# Patient Record
Sex: Female | Born: 1985 | Race: White | Hispanic: No | Marital: Married | Smoking: Never smoker
Health system: Southern US, Community
[De-identification: ages and names within clinical notes are randomized; demographics above are authoritative.]

## PROBLEM LIST (undated history)

## (undated) DIAGNOSIS — D649 Anemia, unspecified: Secondary | ICD-10-CM

## (undated) DIAGNOSIS — Z9189 Other specified personal risk factors, not elsewhere classified: Secondary | ICD-10-CM

## (undated) DIAGNOSIS — Z91199 Patient's noncompliance with other medical treatment and regimen due to unspecified reason: Secondary | ICD-10-CM

## (undated) DIAGNOSIS — R51 Headache: Secondary | ICD-10-CM

## (undated) DIAGNOSIS — F419 Anxiety disorder, unspecified: Secondary | ICD-10-CM

## (undated) DIAGNOSIS — R519 Headache, unspecified: Secondary | ICD-10-CM

## (undated) DIAGNOSIS — R0600 Dyspnea, unspecified: Secondary | ICD-10-CM

## (undated) DIAGNOSIS — E669 Obesity, unspecified: Secondary | ICD-10-CM

## (undated) DIAGNOSIS — N946 Dysmenorrhea, unspecified: Secondary | ICD-10-CM

## (undated) DIAGNOSIS — L0390 Cellulitis, unspecified: Secondary | ICD-10-CM

## (undated) DIAGNOSIS — Z86718 Personal history of other venous thrombosis and embolism: Secondary | ICD-10-CM

## (undated) DIAGNOSIS — Z803 Family history of malignant neoplasm of breast: Secondary | ICD-10-CM

## (undated) DIAGNOSIS — K469 Unspecified abdominal hernia without obstruction or gangrene: Secondary | ICD-10-CM

## (undated) DIAGNOSIS — Z87442 Personal history of urinary calculi: Secondary | ICD-10-CM

## (undated) DIAGNOSIS — I82409 Acute embolism and thrombosis of unspecified deep veins of unspecified lower extremity: Secondary | ICD-10-CM

## (undated) DIAGNOSIS — K439 Ventral hernia without obstruction or gangrene: Secondary | ICD-10-CM

## (undated) DIAGNOSIS — Z862 Personal history of diseases of the blood and blood-forming organs and certain disorders involving the immune mechanism: Secondary | ICD-10-CM

## (undated) HISTORY — PX: LITHOTRIPSY: SUR834

## (undated) HISTORY — PX: INTRAUTERINE DEVICE (IUD) INSERTION: SHX5877

## (undated) HISTORY — DX: Unspecified abdominal hernia without obstruction or gangrene: K46.9

## (undated) HISTORY — DX: Obesity, unspecified: E66.9

## (undated) HISTORY — PX: CHOLECYSTECTOMY: SHX55

## (undated) HISTORY — DX: Family history of malignant neoplasm of breast: Z80.3

## (undated) HISTORY — DX: Cellulitis, unspecified: L03.90

## (undated) HISTORY — PX: TUBAL LIGATION: SHX77

## (undated) HISTORY — PX: TONSILLECTOMY AND ADENOIDECTOMY: SHX28

## (undated) HISTORY — DX: Other specified personal risk factors, not elsewhere classified: Z91.89

## (undated) HISTORY — DX: Acute embolism and thrombosis of unspecified deep veins of unspecified lower extremity: I82.409

## (undated) HISTORY — PX: DILITATION & CURRETTAGE/HYSTROSCOPY WITH ESSURE: SHX5573

## (undated) HISTORY — PX: TONSILLECTOMY: SUR1361

## (undated) HISTORY — DX: Personal history of diseases of the blood and blood-forming organs and certain disorders involving the immune mechanism: Z86.2

## (undated) HISTORY — DX: Dysmenorrhea, unspecified: N94.6

---
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
## 2006-08-26 ENCOUNTER — Emergency Department: Payer: Self-pay | Admitting: Unknown Physician Specialty

## 2007-04-11 ENCOUNTER — Emergency Department: Payer: Self-pay | Admitting: Emergency Medicine

## 2009-08-06 ENCOUNTER — Observation Stay: Payer: Self-pay | Admitting: Unknown Physician Specialty

## 2010-02-13 DIAGNOSIS — L0390 Cellulitis, unspecified: Secondary | ICD-10-CM

## 2010-02-13 HISTORY — DX: Cellulitis, unspecified: L03.90

## 2011-02-14 DIAGNOSIS — I82409 Acute embolism and thrombosis of unspecified deep veins of unspecified lower extremity: Secondary | ICD-10-CM

## 2011-02-14 HISTORY — DX: Acute embolism and thrombosis of unspecified deep veins of unspecified lower extremity: I82.409

## 2011-07-10 ENCOUNTER — Inpatient Hospital Stay: Payer: Self-pay | Admitting: Internal Medicine

## 2011-07-10 DIAGNOSIS — I82621 Acute embolism and thrombosis of deep veins of right upper extremity: Secondary | ICD-10-CM

## 2011-07-10 HISTORY — DX: Acute embolism and thrombosis of deep veins of right upper extremity: I82.621

## 2011-07-10 LAB — URINALYSIS, COMPLETE
Bilirubin,UR: NEGATIVE
Blood: NEGATIVE
Leukocyte Esterase: NEGATIVE
Nitrite: NEGATIVE
Ph: 7 (ref 4.5–8.0)
Protein: NEGATIVE
Squamous Epithelial: 1
WBC UR: 1 /HPF (ref 0–5)

## 2011-07-10 LAB — PREGNANCY, URINE: Pregnancy Test, Urine: POSITIVE m[IU]/mL

## 2011-07-10 LAB — COMPREHENSIVE METABOLIC PANEL
BUN: 6 mg/dL — ABNORMAL LOW (ref 7–18)
Creatinine: 0.5 mg/dL — ABNORMAL LOW (ref 0.60–1.30)
EGFR (African American): >60
EGFR (Non-African Amer.): >60
Glucose: 87 mg/dL (ref 65–99)
Potassium: 3.5 mmol/L (ref 3.5–5.1)
SGOT(AST): 25 U/L (ref 15–37)
SGPT (ALT): 24 U/L
Sodium: 141 mmol/L (ref 136–145)
Total Protein: 8 g/dL (ref 6.4–8.2)

## 2011-07-10 LAB — TROPONIN I: Troponin-I: <0.02 ng/mL

## 2011-07-10 LAB — CBC
HCT: 32.1 % — ABNORMAL LOW (ref 35.0–47.0)
HGB: 9.6 g/dL — ABNORMAL LOW (ref 12.0–16.0)
MCH: 19.2 pg — ABNORMAL LOW (ref 26.0–34.0)
MCV: 64 fL — ABNORMAL LOW (ref 80–100)
Platelet: 303 10*3/uL (ref 150–440)
WBC: 12 10*3/uL — ABNORMAL HIGH (ref 3.6–11.0)

## 2011-07-10 LAB — PRO B NATRIURETIC PEPTIDE: B-Type Natriuretic Peptide: 208 pg/mL — ABNORMAL HIGH (ref 0–125)

## 2011-07-10 LAB — CK TOTAL AND CKMB (NOT AT ARMC): CK, Total: 77 U/L (ref 21–215)

## 2011-07-11 LAB — CBC WITH DIFFERENTIAL/PLATELET
Basophil #: 0.1 10*3/uL (ref 0.0–0.1)
Basophil %: 0.7 %
HGB: 8.6 g/dL — ABNORMAL LOW (ref 12.0–16.0)
Lymphocyte #: 2.1 10*3/uL (ref 1.0–3.6)
MCH: 19.3 pg — ABNORMAL LOW (ref 26.0–34.0)
MCHC: 30.2 g/dL — ABNORMAL LOW (ref 32.0–36.0)
MCV: 64 fL — ABNORMAL LOW (ref 80–100)
Monocyte #: 0.5 x10 3/mm (ref 0.2–0.9)
Monocyte %: 5.6 %
Neutrophil %: 67.9 %
Platelet: 248 10*3/uL (ref 150–440)
RBC: 4.47 10*6/uL (ref 3.80–5.20)
RDW: 19.8 % — ABNORMAL HIGH (ref 11.5–14.5)
WBC: 8.5 10*3/uL (ref 3.6–11.0)

## 2011-07-11 LAB — BASIC METABOLIC PANEL
Anion Gap: 11 (ref 7–16)
BUN: 7 mg/dL (ref 7–18)
Calcium, Total: 8.2 mg/dL — ABNORMAL LOW (ref 8.5–10.1)
Co2: 24 mmol/L (ref 21–32)
Creatinine: 0.64 mg/dL (ref 0.60–1.30)
EGFR (African American): >60
Glucose: 91 mg/dL (ref 65–99)
Potassium: 3.4 mmol/L — ABNORMAL LOW (ref 3.5–5.1)
Sodium: 141 mmol/L (ref 136–145)

## 2011-07-11 LAB — IRON AND TIBC
Iron Bind.Cap.(Total): 335 ug/dL (ref 250–450)
Iron: 41 ug/dL — ABNORMAL LOW (ref 50–170)
Unbound Iron-Bind.Cap.: 294 ug/dL

## 2011-07-12 LAB — HCG, QUANTITATIVE, PREGNANCY: Beta Hcg, Quant.: 7441 m[IU]/mL — ABNORMAL HIGH

## 2011-08-28 ENCOUNTER — Encounter: Payer: Self-pay | Admitting: Maternal and Fetal Medicine

## 2011-11-15 ENCOUNTER — Ambulatory Visit: Payer: Self-pay | Admitting: Obstetrics & Gynecology

## 2011-12-28 ENCOUNTER — Observation Stay: Payer: Self-pay | Admitting: Obstetrics and Gynecology

## 2011-12-28 LAB — URINALYSIS, COMPLETE
Bilirubin,UR: NEGATIVE
Hyaline Cast: 1
Ketone: NEGATIVE
Leukocyte Esterase: NEGATIVE
Nitrite: NEGATIVE
Ph: 6 (ref 4.5–8.0)
Protein: NEGATIVE
RBC,UR: 11 /HPF (ref 0–5)
WBC UR: 2 /HPF (ref 0–5)

## 2011-12-28 LAB — FETAL FIBRONECTIN: Appearance: NORMAL

## 2012-02-14 HISTORY — PX: TUBAL LIGATION: SHX77

## 2012-02-22 ENCOUNTER — Encounter: Payer: Self-pay | Admitting: Maternal & Fetal Medicine

## 2012-03-05 ENCOUNTER — Inpatient Hospital Stay: Payer: Self-pay

## 2012-03-05 LAB — CBC WITH DIFFERENTIAL/PLATELET
Basophil #: 0.1 10*3/uL (ref 0.0–0.1)
Basophil %: 0.4 %
Eosinophil %: 1.1 %
HCT: 29 % — ABNORMAL LOW (ref 35.0–47.0)
Lymphocyte %: 14.1 %
MCH: 19.2 pg — ABNORMAL LOW (ref 26.0–34.0)
MCHC: 31 g/dL — ABNORMAL LOW (ref 32.0–36.0)
Monocyte #: 0.7 x10 3/mm (ref 0.2–0.9)
Neutrophil #: 10.1 10*3/uL — ABNORMAL HIGH (ref 1.4–6.5)
Platelet: 295 10*3/uL (ref 150–440)
RBC: 4.67 10*6/uL (ref 3.80–5.20)
RDW: 18.3 % — ABNORMAL HIGH (ref 11.5–14.5)
WBC: 12.8 10*3/uL — ABNORMAL HIGH (ref 3.6–11.0)

## 2012-03-06 LAB — HEMATOCRIT: HCT: 27.1 % — ABNORMAL LOW (ref 35.0–47.0)

## 2012-03-07 LAB — HEMOGLOBIN: HGB: 9 g/dL — ABNORMAL LOW (ref 12.0–16.0)

## 2012-03-07 LAB — PATHOLOGY REPORT

## 2012-03-07 LAB — CREATININE, SERUM: EGFR (Non-African Amer.): >60

## 2012-03-24 ENCOUNTER — Emergency Department: Payer: Self-pay | Admitting: Emergency Medicine

## 2012-03-24 LAB — COMPREHENSIVE METABOLIC PANEL
Alkaline Phosphatase: 105 U/L (ref 50–136)
Anion Gap: 8 (ref 7–16)
BUN: 13 mg/dL (ref 7–18)
Bilirubin,Total: 0.3 mg/dL (ref 0.2–1.0)
Calcium, Total: 9.2 mg/dL (ref 8.5–10.1)
Co2: 25 mmol/L (ref 21–32)
Creatinine: 0.64 mg/dL (ref 0.60–1.30)
EGFR (Non-African Amer.): >60
Glucose: 93 mg/dL (ref 65–99)
Osmolality: 279 (ref 275–301)
SGOT(AST): 18 U/L (ref 15–37)
SGPT (ALT): 23 U/L (ref 12–78)
Sodium: 140 mmol/L (ref 136–145)
Total Protein: 7.7 g/dL (ref 6.4–8.2)

## 2012-03-24 LAB — CBC WITH DIFFERENTIAL/PLATELET
Basophil %: 1.1 %
HCT: 32.4 % — ABNORMAL LOW (ref 35.0–47.0)
HGB: 9.8 g/dL — ABNORMAL LOW (ref 12.0–16.0)
Lymphocyte #: 1.8 10*3/uL (ref 1.0–3.6)
Lymphocyte %: 26.2 %
MCHC: 30.3 g/dL — ABNORMAL LOW (ref 32.0–36.0)
Monocyte %: 6.7 %
Neutrophil #: 4.5 10*3/uL (ref 1.4–6.5)
Neutrophil %: 63.2 %
Platelet: 596 10*3/uL — ABNORMAL HIGH (ref 150–440)
RDW: 18.6 % — ABNORMAL HIGH (ref 11.5–14.5)

## 2012-03-24 LAB — URINALYSIS, COMPLETE
Glucose,UR: NEGATIVE mg/dL (ref 0–75)
Ketone: NEGATIVE
Ph: 5 (ref 4.5–8.0)
Protein: 25
Specific Gravity: 1.02 (ref 1.003–1.030)

## 2012-03-24 LAB — PROTIME-INR: INR: 2.6

## 2012-05-23 ENCOUNTER — Emergency Department: Payer: Self-pay | Admitting: Emergency Medicine

## 2012-05-23 LAB — URINALYSIS, COMPLETE
Ketone: NEGATIVE
Nitrite: NEGATIVE
Protein: 30
Specific Gravity: 1.021 (ref 1.003–1.030)
Squamous Epithelial: <1
WBC UR: 2 /HPF (ref 0–5)

## 2012-05-23 LAB — BASIC METABOLIC PANEL
BUN: 15 mg/dL (ref 7–18)
Calcium, Total: 9 mg/dL (ref 8.5–10.1)
Co2: 27 mmol/L (ref 21–32)
Creatinine: 0.88 mg/dL (ref 0.60–1.30)
EGFR (African American): >60
EGFR (Non-African Amer.): >60
Glucose: 108 mg/dL — ABNORMAL HIGH (ref 65–99)

## 2012-05-23 LAB — CBC
HCT: 37 % (ref 35.0–47.0)
HGB: 11.9 g/dL — ABNORMAL LOW (ref 12.0–16.0)
MCH: 21.8 pg — ABNORMAL LOW (ref 26.0–34.0)
MCHC: 32.1 g/dL (ref 32.0–36.0)
MCV: 68 fL — ABNORMAL LOW (ref 80–100)
RDW: 21.2 % — ABNORMAL HIGH (ref 11.5–14.5)
WBC: 12 10*3/uL — ABNORMAL HIGH (ref 3.6–11.0)

## 2012-05-23 LAB — PREGNANCY, URINE: Pregnancy Test, Urine: NEGATIVE m[IU]/mL

## 2012-06-07 DIAGNOSIS — N1330 Unspecified hydronephrosis: Secondary | ICD-10-CM | POA: Insufficient documentation

## 2012-06-07 DIAGNOSIS — N201 Calculus of ureter: Secondary | ICD-10-CM | POA: Insufficient documentation

## 2012-06-07 DIAGNOSIS — R339 Retention of urine, unspecified: Secondary | ICD-10-CM | POA: Insufficient documentation

## 2012-06-07 DIAGNOSIS — N23 Unspecified renal colic: Secondary | ICD-10-CM | POA: Insufficient documentation

## 2012-06-10 ENCOUNTER — Ambulatory Visit: Payer: Self-pay | Admitting: Urology

## 2012-06-10 LAB — PREGNANCY, URINE: Pregnancy Test, Urine: NEGATIVE m[IU]/mL

## 2012-06-11 ENCOUNTER — Ambulatory Visit: Payer: Self-pay | Admitting: Urology

## 2013-10-08 LAB — HM PAP SMEAR: HM Pap smear: NORMAL

## 2013-11-13 DIAGNOSIS — Z803 Family history of malignant neoplasm of breast: Secondary | ICD-10-CM

## 2013-11-13 DIAGNOSIS — Z9189 Other specified personal risk factors, not elsewhere classified: Secondary | ICD-10-CM

## 2013-11-13 HISTORY — DX: Other specified personal risk factors, not elsewhere classified: Z91.89

## 2013-11-13 HISTORY — DX: Family history of malignant neoplasm of breast: Z80.3

## 2014-01-04 ENCOUNTER — Emergency Department: Payer: Self-pay | Admitting: Emergency Medicine

## 2014-01-04 LAB — URINALYSIS, COMPLETE
BACTERIA: NONE SEEN
BILIRUBIN, UR: NEGATIVE
Glucose,UR: NEGATIVE mg/dL (ref 0–75)
KETONE: NEGATIVE
Leukocyte Esterase: NEGATIVE
Nitrite: NEGATIVE
Ph: 6 (ref 4.5–8.0)
Protein: 30
RBC,UR: 2416 /HPF (ref 0–5)
Specific Gravity: 1.018 (ref 1.003–1.030)
Squamous Epithelial: NONE SEEN

## 2014-02-18 ENCOUNTER — Ambulatory Visit: Payer: Self-pay | Admitting: Obstetrics and Gynecology

## 2014-02-18 LAB — COMPREHENSIVE METABOLIC PANEL
ALBUMIN: 3.1 g/dL — AB (ref 3.4–5.0)
ALK PHOS: 88 U/L
AST: 22 U/L (ref 15–37)
Anion Gap: 7 (ref 7–16)
BUN: 8 mg/dL (ref 7–18)
Bilirubin,Total: 0.3 mg/dL (ref 0.2–1.0)
CALCIUM: 9.2 mg/dL (ref 8.5–10.1)
Chloride: 104 mmol/L (ref 98–107)
Co2: 27 mmol/L (ref 21–32)
Creatinine: 0.62 mg/dL (ref 0.60–1.30)
EGFR (African American): >60
EGFR (Non-African Amer.): >60
GLUCOSE: 82 mg/dL (ref 65–99)
Osmolality: 273 (ref 275–301)
Potassium: 3.7 mmol/L (ref 3.5–5.1)
SGPT (ALT): 27 U/L
SODIUM: 138 mmol/L (ref 136–145)
Total Protein: 7.8 g/dL (ref 6.4–8.2)

## 2014-02-18 LAB — CBC
HCT: 34.9 % — ABNORMAL LOW (ref 35.0–47.0)
HGB: 11.1 g/dL — ABNORMAL LOW (ref 12.0–16.0)
MCH: 22.7 pg — ABNORMAL LOW (ref 26.0–34.0)
MCHC: 31.7 g/dL — AB (ref 32.0–36.0)
MCV: 72 fL — ABNORMAL LOW (ref 80–100)
PLATELETS: 444 10*3/uL — AB (ref 150–440)
RBC: 4.87 10*6/uL (ref 3.80–5.20)
RDW: 16.8 % — AB (ref 11.5–14.5)
WBC: 9.3 10*3/uL (ref 3.6–11.0)

## 2014-02-26 ENCOUNTER — Ambulatory Visit: Payer: Self-pay | Admitting: Obstetrics and Gynecology

## 2014-06-02 NOTE — Consult Note (Signed)
Referral Information:   Reason for Referral RUE DVT with the pregnancy and Heterozygosity for protrhombin gene mutation    Referring Physician [facility]    Prenatal Hx Diana Sexton is a 29 year old [id number] with an EDC of 03/10/2012 (12 2/7 weeks??? gestation: pregnancy dated by ultrasound done on 6/28.2103 that had measurements of 9 5/7 weeks). On 07/10/2011, the patient started to have right arm pain and swelling. She had transient right chest pain and shortness of breath. She was hospitalized for a right upper extremity deep venous thrombosis ??? specifically there was an occlusive thrombus of the right subclavian vein and right brachial vein. There was a nonocclusive thrombus in the upper portion of the axillary vein. She was started on enoxaparin 1mg /kg twice daily. She was discharged on 07/12/2011. 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 During the pregnancy of her first delivery, she was given prophylaxis against venous thromboembolism. With that pregnancy, she had no thromboembolic complication.    Past Obstetrical Hx G1 -- 2010: SAb at 6 weeks G2 -- 2010: SAb at 10 weeks G3 -- 2011: NSVD female 8#0oz (possible mild shoulder dystocia) G4 -- 2012: 16 week IUFD; expelled fetus but needed D&C for placenta   Home Medications: Medication Instructions Status  Lovenox 115 milligram(s) injectable every 12 hours until delevery of baby Active  Prenatal Multivitamins oral tablet 1 tab(s) orally once a day Active   Allergies:   No Known Allergies:   Vital Signs/Notes:  Nursing Vital Signs: **Vital Signs.:   15-Jul-13 12:59   Vital Signs Type Routine   Temperature Temperature (F) 98.7   Celsius 37   Temperature Source oral   Pulse Pulse 91   Pulse source if not from Vital Sign Device dinamap   Respirations Respirations 18   Systolic BP Systolic BP 518   Diastolic BP (mmHg) Diastolic BP (mmHg) 77   Mean BP 98   BP Source  if not from Vital Sign  Device dinamap   Perinatal Consult:   Past Medical History cont'd Surgery -- as above   Cholecystectomy   T&A Other illness -- Obesity Transfusions -- none  Soc Hx    Smoking/ETOH/Drugs -- negative  Fam Hx - father with multiple VTE events; also has prothrombin gene mutation  ROS - nausea and vomitting of pregnancy   Impression/Recommendations:   Impression ??? IUP at 12 2/7 weeks??? gestation ??? Right upper extremity deep venous thrombosis diagnosed on 07/10/2011 (diagnosed at approximately 4 weeks??? gestation). ??? Symptoms consistent for (but not tested for) pulmonary embolism surrounding hospitalization for her right upper extremity deep venous thrombus ??? Heterozygosity for the prothrombin gene mutation    Recommendations ??? The patient had genetic counseling today. ??? Full anticoagulation with enoxaparin 1 mg/kg twice daily for the remainder of the pregnancy. ??? In third trimester, every 4 week ultrasounds to monitor fetal growth. ??? Plan delivery (induction) at 39-[redacted] weeks gestation. ??? Stop enoxaparin 12 hours before scheduled induction. ??? No medical anticoagulation during labor. ??? Provide intermittent lower extremity compression during labor (if patient not ambulating). ??? Twelve hours after vaginal delivery or 24 hours after cesarean delivery restart enoxaparin at 1mg /kg twice daily. ??? At greater than 7 days postpartum, start warfarin at 5 mg daily. ??? Continue enoxaparin until INR is 2.0-2.5. ??? Continue anticoagulation for at least 6 weeks postpartum (Length of anticoagulation may have to be longer if hematology consult deems it necessary.). ??? Patient will need full anticoagulation with all future pregnancies.  Total Time Spent with Patient 30 minutes    >50% of visit spent in couseling/coordination of care yes    Office Use Only 99[address]  Level 2 (67min) NEW office consult exp prob focused   Coding Description: OTHER: 1) RUE DVT; 2)  Prothrombin gene mutation.  Electronic Signatures: Sande Rives (MD)  (Signed 15-Jul-13 13:35)  Authored: Referral, Home Medications, Allergies, Vital Signs/Notes, Consult, Impression, Billing, Coding Description   Last Updated: 15-Jul-13 13:35 by Sande Rives (MD)

## 2014-06-05 NOTE — Op Note (Signed)
PATIENT NAME:  Diana Sexton, Diana Sexton MR#:  [id number] DATE OF BIRTH:  [birth year]  DATE OF PROCEDURE:  06/11/2012  PREOPERATIVE DIAGNOSIS: Left ureterolithiasis.   POSTOPERATIVE DIAGNOSIS: Left ureterolithiasis.   PROCEDURE PERFORMED: Left ureteroscopy with holmium laser lithotripsy.   SURGEON: Edrick Oh, MD   ANESTHESIA: Laryngeal mask airway anesthesia.   INDICATIONS: The patient is a 29 year old white female with a history of nephrolithiasis. She presented recently to the Emergency Room with left-sided flank pain. A CT scan demonstrated a 7 mm distal left ureteral calculus. There has been failure of progression with continued intermittent discomfort. She presents for ureteroscopic stone removal.   DESCRIPTION OF PROCEDURE: After informed consent was obtained, the patient was taken to the operating room and placed in the dorsal lithotomy position under laryngeal mask airway anesthesia. The patient was then prepped and draped in the usual standard fashion. The 22-French rigid cystoscope was introduced into the urethra under direct vision with no urethral abnormalities noted. Upon entering the bladder, the mucosa was inspected in its entirety with no gross mucosal lesions noted. Bilateral ureteral orifices were well visualized with no lesions noted. A flexible tip Glidewire was introduced into the left ureteral orifice and into the upper pole collecting system under fluoroscopic guidance without difficulty. No resistance was met.  The cystoscope was removed. The 6-French rigid ureteroscope was advanced into the urinary bladder. It was easily advanced into the left ureteral orifice.  Mild edema was encountered. The scope was able to be passed into a more dilated portion of the distal ureter approximately 1 cm past the orifice. An approximate 7 mm stone was encountered at that point.  The stone was fragmented into multiple smaller pieces utilizing the holmium laser fiber. The fragments were then basket  extracted. The scope was advanced to the level of the ureteropelvic junction with no additional abnormalities noted. The ureter was re-examined upon withdrawal of the scope.  No significant edema or occlusion of the ureter was noted at the site of stone impaction. The decision was made not to place a stent. Only a few small stone grains remained. The ureteroscope was removed. The cystoscope was replaced back into the urinary bladder. The remaining stone fragments were irrigated free from the bladder. They were collected and will be sent for stone analysis. The bladder was then drained. The cystoscope was removed. The patient was returned to the supine position and awakened from laryngeal mask airway anesthesia. She was taken to the recovery room in stable condition. There were no problems or complications. The patient tolerated the procedure well.   ____________________________ Denice Bors. Jacqlyn Larsen, MD bsc:cb D: 06/11/2012 12:58:12 ET T: 06/11/2012 15:00:45 ET JOB#: [id number]  cc: Denice Bors. Jacqlyn Larsen, MD, <Dictator> Denice Bors Evamae Rowen MD ELECTRONICALLY SIGNED 06/11/2012 17:23

## 2014-06-05 NOTE — Op Note (Signed)
PATIENT NAME:  Diana Sexton, JONS MR#:  [id number] DATE OF BIRTH:  Nov 18, 1985  DATE OF PROCEDURE:  03/06/2012  PREOPERATIVE DIAGNOSIS:  Multiparity, desires permanent sterilization.   POSTOPERATIVE DIAGNOSIS:  Multiparity, desires permanent sterilization.  PROCEDURE:  Postpartum bilateral tubal ligation via Pomeroy method.    ANESTHESIA:  General.   SURGEON:  Prentice Docker, M.D.  ESTIMATED BLOOD LOSS:  100 mL.   OPERATIVE FLUIDS:  900 mL crystalloid.   COMPLICATIONS:  Difficulty in finding right fallopian tube and possible removal of segment of round ligament.   FINDINGS:  Uterus that is somewhat adherent to right abdominal/pelvic sidewall and requiring great effort in extending the incision to locate the right fallopian tube, otherwise normal-appearing fallopian tubes.   SPECIMENS: 1.  Portion of right and left fallopian tubes.  2.  Likely portion of round ligament.   CONDITION:  Stable and unchanged at the end of procedure.   PROCEDURE IN DETAIL:  The patient was taken to the operating room where she was placed under general anesthesia, which was found to be adequate.  She was placed in the dorsal supine position and prepped and draped in the usual sterile fashion.  She had an existing vertical umbilical scar which was utilized initially and after a timeout was called, local anesthetic was injected into the umbilicus area and approximately a 3 cm incision was made, carried through the various layers until the rectus fascia was identified and entered sharply.  The rectus muscles then identified and the posterior fascial sheath was then identified and entered sharply.  Once the uterus was identified, the left fallopian tube was then easily identified, grabbed with a Babcock clamp and then followed out to the fimbriated edge.  Using the Babcock clamps the fallopian tube was then followed back to approximately 3 cm from the cornual region where approximately a 3 cm segment of the fallopian  tube was suture ligated and a portion of the tube was removed using Metzenbaum scissors.  Hemostasis was verified.  Two sutures of #0 plain gut were used to obtain ligation.  The fallopian tube was then returned to the abdomen.   Attention was then turned to the right portion of the uterus.  The patient had to be maximally airplaned to the left in order to attempt to have the uterus rotate to come to the incision.  Ultimately to gain visualization the opening was extended to approximately 5 cm.  Eventually a structure which appeared to be the fallopian tube was identified, grasped with a Babcock clamp and followed, however given the patient's body habitus and tension required to maintain visualization, the tissue began to loosen and became unattached and therefore it began to look somewhat like a fallopian tube.  At this point there was some bleeding and because there was a free end of what appeared to be the fallopian tube unattached to the mesosalpinx, the base of this structure was grasped with the Kelly clamp and then #0 Vicryl was then used to obtain hemostasis followed by a Heaney stitch to assure hemostasis.  This area was watched for quite a while to assess the hemostasis which was found to be present.  Given high suspicion for this not being the fallopian tube, further exploration was undertaken and indeed a fallopian tube on the right side was identified, separate from the previous structure.  This was identified, grasped with a Babcock clamp and followed out to the fimbriated edge.  Then followed back using the Babcock clamps to a region approximately  3 cm from the cornual region.  An approximate 3 cm segment was suture ligated with two sutures of 0 plain gut.  A portion of the tube was removed after ligation had taken place.  Hemostasis was obtained.  And after verification of hemostasis the fallopian tube was returned to the abdomen.  The abdominal cavity was monitored for several minutes to ensure  that there was no active bleeding and none was found.  The fascia was closed using #0 Vicryl on a UR-6 needle.  The subcutaneous fat was reapproximated using #2-0 Vicryl in a pursestring fashion around the umbilicus opening.  The skin was closed using #3-0 Vicryl in a subcuticular fashion.  The skin was reapproximated with Dermabond.  Another 20 mL of local anesthetic was injected on either side of the incision.   The patient tolerated the procedure well.  Sponge, lap, needle counts were correct x 2.  Throughout the procedure the patient had on pneumatic compression stockings which were in place and operating throughout the entire procedure.  She was awakened in the operating room and taken to the recovery area in stable condition.      ____________________________ Will Bonnet, MD sdj:ea D: 03/06/2012 20:36:14 ET T: 03/06/2012 23:44:42 ET JOB#: [id number]  cc: Will Bonnet, MD, <Dictator> Will Bonnet MD ELECTRONICALLY SIGNED 04/11/2012 22:06

## 2014-06-05 NOTE — Consult Note (Signed)
Referral Information:   Reason for Referral RUE DVT with the pregnancy and Heterozygosity for protrhombin gene mutation.  Here to readdress anticoagulation recommendations.    Referring Physician [facility]    Prenatal Hx Diana Sexton is a 29 year old [id number] with an EDC of 03/10/2012 (36/5 weeks??? gestation: pregnancy dated by ultrasound done on 6/28.2103 that had measurements of 9 5/7 weeks).  She met with Dr. Patric Dykes at [redacted] weeks gestation and is here to readdress the anticoagulation recommendations (remain on therapeutic anticoagulation with lovenox during pregnancy).  She is heterozygous for Prothrombin G gene mutation and on 07/10/2011, was hospitalized for a right upper extremity deep venous thrombosis due to occlusive thrombus of the right subclavian vein and right brachial vein. There was a nonocclusive thrombus in the upper portion of the axillary vein.  She also reported chest pain and shortness of breath.    Past Obstetrical Hx G1 -- 2010: SAb at 6 weeks G2 -- 2010: SAb at 10 weeks G3 -- 2011: NSVD female 8#0oz (possible mild shoulder dystocia received prophylactic anticoagulation, no history of clot formation) G4 -- 2012: 16 week IUFD; expelled fetus but needed D&C for placenta   Home Medications: Medication Instructions Status  Lovenox 115 milligram(s) injectable every 12 hours until delevery of baby Active  Prenatal Multivitamins oral tablet 1 tab(s) orally once a day Active   Allergies:   No Known Allergies:   Vital Signs/Notes:  Nursing Vital Signs: **Vital Signs.:   09-Jan-14 10:49   Temperature Temperature (F) 96.7   Celsius 35.9   Pulse Pulse 105   Respirations Respirations 18   Systolic BP Systolic BP 737   Diastolic BP (mmHg) Diastolic BP (mmHg) 70   Mean BP 85   Perinatal Consult:   Past Medical History cont'd Surgery -- as above   Cholecystectomy   T&A Other illness -- Obesity Transfusions -- none  Soc Hx    Smoking/ETOH/Drugs -- negative  Fam  Hx - father with multiple VTE events; also has prothrombin gene mutation  ROS - nausea and vomitting of pregnancy     Additional Lab/Radiology Notes fhr 140s   Impression/Recommendations:   Impression ??? IUP at 36/5 weeks??? with:  ??? Right upper extremity deep venous thrombosis diagnosed on 07/10/2011 (diagnosed at approximately 4 weeks??? gestation). ??? Symptoms consistent for (but not tested for) pulmonary embolism surrounding hospitalization for her right upper extremity deep venous thrombus ??? Heterozygosity for the prothrombin gene mutation gestation  Diana Sexton was here to discuss anticoagulation recommenations and delivery planning. She was previously counseled by Dr. Patric Dykes and, based on her status as a patient at highest risk category for clot formation, continuation of lovenox throughout pregnancy was recommended.  She was concerned about how her anticoagulation would be managed if she goes into spontaneous labor before the completion of 12 hours following her lovenox injection.  We addressed the fact that she may not be eligible for epidural placement and reviewed holding lovenox if she ruptures membranes or experiences painful contractions.    Recommendations We reviewed Dr. Ursula Beath recommendations for anticoagulation based on her status as a high risk patient: ??? Full anticoagulation with enoxaparin 1 mg/kg twice daily for the remainder of the pregnancy. ??? Plan delivery (induction) at 39-[redacted] weeks gestation. ??? Stop enoxaparin 12 hours before scheduled induction. ??? No medical anticoagulation during labor. ??? Provide intermittent lower extremity compression during labor (if patient not ambulating). ??? Twelve hours after vaginal delivery or 24 hours after cesarean delivery restart enoxaparin at 58m/kg twice daily. ???  At greater than 7 days postpartum, start warfarin at 5 mg daily. ??? Continue enoxaparin until INR is 2.0-2.5. ??? Continue anticoagulation for  at least 6 weeks postpartum (Length of anticoagulation may have to be longer if hematology consult deems it necessary.). ??? Patient will need full anticoagulation with all future pregnancies.     Total Time Spent with Patient 30 minutes    >50% of visit spent in couseling/coordination of care yes   Coding Description: MATERNAL CONDITIONS/HISTORY INDICATION(S).   Primary hypercoagulable state.  Electronic Signatures: Manfred Shirts (MD)  (Signed 09-Jan-14 12:58)  Authored: Referral, Home Medications, Allergies, Vital Signs/Notes, Consult, Lab/Radiology Notes, Impression, Billing, Coding Description   Last Updated: 09-Jan-14 12:58 by Manfred Shirts (MD)

## 2014-06-07 NOTE — Consult Note (Signed)
Brief Consult Note: Diagnosis: DVT right arm, + prothrombin gene mutation; + pregnancy.   Patient was seen by consultant.   Recommend further assessment or treatment.   Comments: Continue Lovenox and elevation no indication for a filter given arm clot given pregnancy and the fact that the right arm is improving no lysis.  Electronic Signatures: Hortencia Pilar (MD)  (Signed [phone] 06:56)  Authored: Brief Consult Note   Last Updated: 29-May-13 06:56 by Hortencia Pilar (MD)

## 2014-06-07 NOTE — Consult Note (Signed)
General Aspect DVT right arm    Present Illness The patient is a 29 year old woman with a history of heterozygous prothrombin gene mutation developed right arm pain, swelling several days ago which progressed throughout the course of the day.  She came to ED for further evaluation. In addition, she complains of right upper chest pain with mild shortness of breath. Patient denies any fever, chills. No headache or dizziness. No cough, sputum. No leg edema or tenderness. Patient had a venous duplex which showed right arm deep vein thrombosis. In addition, pregnancy test is positive. Patient said she is not on any contraceptive medication. She does not have a history to this point of DVT. She has been started of Lovenox and is elevating her arm as much as possible.  She states that it is better thanwhen she came in to the ER and she is denies had pain at this time. "It is just tight when I try to use it".  PAST MEDICAL HISTORY:  1.           Heterozygous prothrombin gene mutation.    PAST SURGICAL HISTORY:  1. Dilatation and curettage.   2. Cholecystectomy.  3. Tonsillectomy.    No Known Allergies:   Case History:   Family History Father is + for prothrombin gene mutation    Social History negative tobacco, negative ETOH, negative Illicit drugs   Review of Systems:   Fever/Chills No    Cough No    Sputum No    Abdominal Pain No    Diarrhea No    Constipation No    Nausea/Vomiting Yes    SOB/DOE No    Chest Pain No    Telemetry Reviewed NSR    Dysuria No   Physical Exam:   GEN well developed, no acute distress, obese    HEENT pink conjunctivae, hearing intact to voice, good dentition    NECK supple  trachea midline    RESP normal resp effort  no use of accessory muscles    CARD regular rate  no JVD    ABD soft  nondistended    EXTR negative cyanosis/clubbing, positive edema, right arm larger than the left; no signs of phlegmasia    SKIN No rashes, skin turgor  good    NEURO cranial nerves intact, follows commands, motor/sensory function intact    PSYCH alert, A+O to time, place, person, good insight   Nursing/Ancillary Notes: **Vital Signs.:   28-May-13 06:00   Vital Signs Type Routine   Temperature Temperature (F) 98.7   Celsius 37   Temperature Source oral   Pulse Pulse 95   Pulse source per Dinamap   Respirations Respirations 20   Systolic BP Systolic BP 585   Diastolic BP (mmHg) Diastolic BP (mmHg) 65   Mean BP 78   BP Source Dinamap   Pulse Ox % Pulse Ox % 97   Pulse Ox Activity Level  At rest   Oxygen Delivery Room Air/ 21 %   Routine Coag:  27-May-13 16:18    Activated PTT (APTT) 27.1  Routine Chem:  27-May-13 16:18    B-Type Natriuretic Peptide [facility]) 208  Cardiac:  27-May-13 16:18    CK, Total 77   CPK-MB, Serum < 0.5  Routine Chem:  27-May-13 16:18    Glucose, Serum 87   BUN 6   Creatinine (comp) 0.50   Sodium, Serum 141   Potassium, Serum 3.5   Chloride, Serum 103   CO2, Serum 25  Calcium (Total), Serum 8.9  Hepatic:  27-May-13 16:18    Bilirubin, Total 0.4   Alkaline Phosphatase 78   SGPT (ALT) 24   SGOT (AST) 25   Total Protein, Serum 8.0   Albumin, Serum 3.3  Routine Chem:  27-May-13 16:18    Osmolality (calc) 278   eGFR (African American) >60   eGFR (Non-African American) >60   Anion Gap 13  Routine Hem:  27-May-13 16:18    WBC (CBC) 12.0   RBC (CBC) 5.02   Hemoglobin (CBC) 9.6   Hematocrit (CBC) 32.1   Platelet Count (CBC) 303   MCV 64   MCH 19.2   MCHC 29.9   RDW 19.6  Routine Coag:  27-May-13 16:18    Prothrombin 14.1   INR 1.1  Cardiac:  27-May-13 16:18    Troponin I < 0.02  Routine Chem:  27-May-13 16:18    HCG Betasubunit Quant. Serum 4299  Cardiology:  27-May-13 16:45    Ventricular Rate 97   Atrial Rate 97   P-R Interval 130   QRS Duration 88   QT 352   QTc 447   R Axis -24   T Axis -23  Routine UA:  27-May-13 17:25    Color (UA) Yellow   Clarity (UA) Clear    Glucose (UA) Negative   Bilirubin (UA) Negative   Ketones (UA) 1+   Specific Gravity (UA) 1.017   Blood (UA) Negative   pH (UA) 7.0   Protein (UA) Negative   Nitrite (UA) Negative   Leukocyte Esterase (UA) Negative   RBC (UA) 4 /HPF   WBC (UA) 1 /HPF   Epithelial Cells (UA) 1 /HPF   Mucous (UA) PRESENT  Routine Sero:  27-May-13 17:25    Pregnancy Test, Urine POSITIVE  Routine Chem:  28-May-13 05:00    Glucose, Serum 91   BUN 7   Creatinine (comp) 0.64   Sodium, Serum 141   Potassium, Serum 3.4   Chloride, Serum 106   CO2, Serum 24   Calcium (Total), Serum 8.2   Osmolality (calc) 279   eGFR (African American) >60   eGFR (Non-African American) >60   Anion Gap 11  Routine Hem:  28-May-13 05:00    WBC (CBC) 8.5   RBC (CBC) 4.47   Hemoglobin (CBC) 8.6   Hematocrit (CBC) 28.5   Platelet Count (CBC) 248   MCV 64   MCH 19.3   MCHC 30.2   RDW 19.8   Neutrophil % 67.9   Lymphocyte % 24.2   Monocyte % 5.6   Eosinophil % 1.6   Basophil % 0.7   Neutrophil # 5.8   Lymphocyte # 2.1   Monocyte # 0.5   Eosinophil # 0.1   Basophil # 0.1  Routine Chem:  28-May-13 05:00    Iron Binding Capacity (TIBC) 335   Unbound Iron Binding Capacity 294   Iron, Serum 41   Iron Saturation 12   Radiology Results: [place]:    27-May-13 16:00, [place] Color Flow Dopper Upper Extrem Right   [place] Color Flow Dopper Upper Extrem Right    REASON FOR EXAM:    R arm swelling, numbness, hx of coag probs.  COMMENTS:   May transport without cardiac monitor    PROCEDURE: [place]  - US DOPPLER UP EXTR RIGHT  - Jul 10 2011  4:00PM     RESULT: The venous system of the right arm is interrogated fromthe level   of the jugular vein inferiorly to  the elbow. The visualized portion of   the jugular vein is patent with no thrombus evident. There is observed   occlusive thrombus in the subclavian. There is nonocclusive thrombus   observed in the upperportion of the axillary vein. The brachial veins do   not compress  or reveal vascular flow on Doppler examination. The findings   are consistent with brachial vein occlusive thrombus. The cephalic,   basilic and axillary veins are normal in appearance with no thrombus seen.    IMPRESSION:  1.  There are observed changes consistent with thrombus in the   subclavian, axillary and brachial veins with occlusive thrombus noted in   the subclavian and brachial veins. There is nonocclusive thrombus in the   axillary vein.  2.  The superficial veins of the arm including the cephalic, basilic and   antecubital show no thrombus.    Dictation Site: 2    Thank you for this opportunity to contribute to the care of your patient.         Verified By: Dionne Ano WALL, M.D., MD     Impression 1.  Right Arm DVT         Continue Lovenox and elevation no indication for an IVC filter given location of thrombus is the arm  given pregnancy and the fact that the right arm is improving no lysis or exposure to X-ray. 2.  Prothrombin gene mutation          consider hematology to determine if the patient should be life long Coumadin given this thrombotic episode.  Certainly she will need to be anticoagulation for a minimum of 6 months  3.  Pregnancy           OB on consult 4.  Obesity           nutritional consult 5.  Anemia           patient must be anticoagulated           start Fe supplement           OB to follow through her pregnancy    Plan Level 4 consult   Electronic Signatures: Hortencia Pilar (MD)  (Signed 29-May-13 15:56)  Authored: General Aspect/Present Illness, Allergies, History and Physical Exam, Vital Signs, Labs, Radiology, Impression/Plan   Last Updated: 29-May-13 15:56 by Hortencia Pilar (MD)

## 2014-06-07 NOTE — Consult Note (Signed)
Brief Consult Note: Diagnosis: Pregnant, RUE DVT, prothrombin gene mutation.   Patient was seen by consultant.   Consult note dictated.   Recommend further assessment or treatment.   Orders entered.   Comments: 1) DVT - will need to remain on therapitic dose lovenox for remainder of pregnancy            - follow Xa levels check  6hrs after 4th dose (0.6-1.0 IU/mL theraputic range)  2) Pregnancy - start PNV, repeat BHCG to verify rising appropriately, may consider repeat TVUS if doublinf appropriately in next weekto document viability.  Electronic Signatures: Dorthula Nettles (MD)  (Signed [phone] 08:13)  Authored: Brief Consult Note   Last Updated: 28-May-13 08:13 by Dorthula Nettles (MD)

## 2014-06-07 NOTE — Consult Note (Signed)
Consulting Department: Medicine Consulting Physician: Ray Church MD  Consulting Question: Right upper extremity DVT, prothrombin gene mutation, and early pregnancy assist in management  History of Present Illness: Diana Sexton is a 29 year old G5P1031 at 4 weeks 3 days gestation by LMP of 06/10/2011 who presented to the ER on 06/10/2011 with a one day history of upper right extremity pain and swelling and was found to have an upper right extremity DVT in the setting of a known prothrombin gene mutation as well as positive UPT.  Her menses are regularl monthly, last appoximately 5 days in duration.  She denies any inciting trauma to the affected extremity.  The patient recently relocated to the area from [place].  She was maintained on theraputic anticoagulation throughout her one term pregnancy.  She herself has no personal history of DVT or PE and the diagnosis was made after she has two early miscarriaged in the setting of a positive family history of prothrombin gene mutation in her father who was diagnosed after presenting with multiple DVT's.    Review of Systems: 10 point review of systems negative unless otherwise noted in HPI  Past Medical History:Prothrombin gene mutation - reportedly heterozygousRight upper extremity DVT - diagnosed this admissionCongenital absence of one kidney (unclear if horseshoe kidney, or complete absence)  Past Surgical History:Laproscopic cholecystectomy 2007T&A 2007D&C 2012 retained placenta  Past Gynecologic History: Denies history of STI, history of prior abnormal pap with normal follow up, most recent pap early 2012  Past Obstetric History: G5P1031SAB at 5 weeks 2009SAB at 8 weeks 2010Terms SVD, female, weight 8lbs, IOL for postdates with delivery complicated by shoulder dystocia, was maintained on anticoagulation with Lovenox then switch the heparin at 36 weeks and postpartumSAB 16 week, suspected chorioamnionitis, D&C for retained placenta  Family History:  father with prothrombin gene mutation, otherwise non-contributory  Social History: Denies tobacco, EtOH, or illicit drug use  Home Medications: none  Hospital Medications:Percocet 5/325 1-2 tabs every 6hrs prn painLovenox 115mg  q12hrs  Physical Exam:AF VSSNADnormocephalic, anictericno thyromegalyCTABRRRNABS, soft, non-tender, non-distendeddeferredno lower extremity erythema, tenderness, or edema.  Right upper extremity slightly swollen tenderness over upper aspect and shoulder.   Labs:  07/10/2011 at 16:18: 429905/27/2013 and BMP 07/10/2011 reviewed05/28/2013 WBC 8.5, H&H 8.6 & 28.5, platelets 248  Imaging:07/10/2011 shows what appears to be an early IUP but no documented heart tones yet  Assessment:yo G5P1031 at [redacted]w[redacted]d gestation by LMP of 06/10/2011 presenting with RUQ DVT in setting of know heterozygous prothrombin gene mutation   Plan: DVT - patient to remain on theraputic lovenox dosing, no warfarin to be used secondary to teratogenic nature.  Agree with 1mg /kg q12hr dosing for Lovenox.  Generally follow Xa levels in pregnancy, will obtain after 4th dose of lovenox.  Target for factor Xa activity is 0.6-1.0 IU/mL.    Pregnancy - uncertain viability at present as no FHT's recorded yet.  Will obtain second BHCG to establish appropriate rise.  Will likely obtain repeat TVUS within the next week to document FHT's, this may be done outpatient if patient is discharged.     - start PNV        Electronic Signatures: Dorthula Nettles (MD) (Signed on 28-May-13 10:32)  Authored   Last Updated: 28-May-13 20:08 by Dorthula Nettles (MD)

## 2014-06-07 NOTE — H&P (Signed)
PATIENT NAME:  Diana Sexton, Diana Sexton MR#:  [id number] DATE OF BIRTH:  October 06, 1985  DATE OF ADMISSION:  07/10/2011  PRIMARY CARE PHYSICIAN: Nonlocal  REFERRING PHYSICIAN: Dr. Jasmine December    CHIEF COMPLAINT: Right arm pain and swelling today.   HISTORY OF PRESENT ILLNESS: 29 year old Caucasian female with a history of heterozygous prothrombin gene mutation developed right arm pain, swelling early this morning when she woke up which is worsening today so she came to ED for further evaluation. In addition, she complains of right upper chest pain with mild shortness of breath early this morning but no chest pain and shortness of breath after coming to ED. Patient denies any fever, chills. No headache or dizziness. No cough, sputum. No orthopnea or nocturnal dyspnea. No leg edema or tenderness. Patient got venous duplex which showed right arm deep vein thrombosis. In addition, pregnancy test is positive. Patient said she is not on any contraceptive medication. She had one kidney, 83 months old. When she had first pregnant she had Lovenox for prophylaxis but this time she did not know she is pregnant until today.   PAST MEDICAL HISTORY: Heterozygous prothrombin gene mutation. Never had a deep venous thrombosis before.    SOCIAL HISTORY: Denies any smoking, alcohol drinking, or illicit drugs.   PAST SURGICAL HISTORY:  1. Dilatation and curettage.   2. Cholecystectomy.  3. Tonsillectomy.   FAMILY HISTORY: Father had heterozygous prothrombin gene mutation and deep venous thrombosis, pulmonary embolism.   ALLERGIES: No.    MEDICATIONS: No.   REVIEW OF SYSTEMS: CONSTITUTIONAL: Patient denies any fever, chills. No headache or dizziness. No weakness. EYES: No double vision, blurred vision. ENT: No dysphagia, slurred speech, postnasal drip or epistaxis. RESPIRATORY: No cough, sputum. Had one episode of mild shortness of breath. No wheezing. No hemoptysis. CARDIOVASCULAR: Positive for mild chest pain in right upper  side. No palpitations, orthopnea, nocturnal dyspnea. No leg edema. GASTROINTESTINAL: No abdominal pain, nausea, vomiting, or diarrhea. No melena, bloody stool. GENITOURINARY: No dysuria, hematuria, or incontinence. ENDOCRINE: No polyuria, polydipsia, heat or cold intolerance. HEMATOLOGY: No easy bruising or bleeding. NEUROLOGY: No syncope, loss of consciousness or seizure. MUSCULOSKELETAL: Right arm tenderness and swelling.   PHYSICAL EXAMINATION:  VITAL SIGNS: Temperature 96, blood pressure 127/58, pulse 98, respirations 18, oxygen saturation 100% on room air.   GENERAL: Patient is alert, awake, oriented in no acute distress.   HEENT: Pupils are round, equal, reactive to light and accommodation. Moist oral mucosa. Clear oropharynx.   NECK: Supple. No JVD or carotid bruits. No lymphadenopathy. No thyromegaly   CARDIOVASCULAR: S1, S2 regular rate, rhythm. No murmurs, gallops.   PULMONARY: Bilateral air entry. No wheezing or rales. No use of accessory muscles to breathe.   ABDOMEN: Soft. No distention or tenderness. No organomegaly. Bowel sounds present. Obese.   EXTREMITIES: No edema, clubbing, or cyanosis. No calf tenderness but has right arm tenderness and swollen. No erythema. In addition, patient has right upper chest wall tenderness but no erythema.   NEUROLOGIC: Alert and oriented x3. No focal deficit. Power 5/5. Sensation intact. Deep tendon reflexes 2+.   LABORATORY, DIAGNOSTIC AND RADIOLOGICAL DATA: Urinalysis is negative. Pregnancy test is positive. PTT 27.1. BNP 208, CK 77, CK-MB less than 0.5, glucose 87, BUN 6, creatinine 0.5. Electrolytes are normal. WBC 12, hemoglobin 9.6, platelets 303, INR 1.1. Troponin less than 0.02. Venous duplex shows thrombosis in the subclavian, axillary and brachial veins. There is obstructive thrombosis noted in the subclavian and brachial vein.    IMPRESSION:  1. Right arm complicated deep venous thrombosis with subclavian thrombosis. Cannot rule out  pulmonary embolus.   2. Heterozygous prothrombin gene mutation.  3. Leukocytosis possibly due to reaction.  4. Anemia.  5. Pregnancy.   PLAN OF TREATMENT: The patient will be admitted to medical floor. We will start Lovenox 1 mg/kg sub-Q q.12 hours and follow up CBC. Patient then may need OB/GYN consult later if necessary. Discussed the patient's situation and plan of treatment with patient, patient's father.   TIME SPENT: About 55 minutes.   ____________________________ Demetrios Loll, MD qc:cms D: 07/10/2011 19:45:30 ET T: 07/11/2011 08:09:48 ET JOB#: [id number]  cc: Demetrios Loll, MD, <Dictator> Demetrios Loll MD ELECTRONICALLY SIGNED 07/12/2011 15:58

## 2014-06-07 NOTE — Discharge Summary (Signed)
PATIENT NAME:  Diana Sexton, Diana Sexton MR#:  [id number] DATE OF BIRTH:  February 12, 1986  DATE OF ADMISSION:  07/10/2011 DATE OF DISCHARGE:  07/12/2011  ADMITTING PHYSICIAN: Dr. Demetrios Loll DISCHARGING PHYSICIAN: Dr. Gladstone Lighter  PRIMARY CARE PHYSICIAN: None   CONSULTATIONS [place]:  1. OB/GYN consultation by Dr. Malachy Mood  2. Vascular consultation by Dr. Hortencia Pilar  DISCHARGE DIAGNOSES:  1. Right upper extremity deep venous thrombosis-occlusive clot noted in right subclavian, nonocclusive thrombus in axillary vein and brachial vein.  2. Prothrombin gene mutation with family history of multiple clots.  3. Pregnancy. 4. Morning sickness related to pregnancy.  5. Iron deficiency anemia.   DISCHARGE HOME MEDICATIONS:  1. Zofran 4 mg oral disintegrating tablet 1 tablet p.o. q.8 hours p.r.n. for nausea, vomiting.  2. Prenatal vitamin with iron supplements 1 tablet p.o. daily.  3. Lovenox 115 mg sub-Q q.12 hours until delivery of the baby and after that depending as recommended by hematology.   DISCHARGE DIET: Regular diet.   DISCHARGE ACTIVITY: As tolerated with no heavy lifting.    FOLLOW UP INSTRUCTIONS: Follow up with [facility] in 5 to 6 days. Appointment scheduled with Dr. Georgianne Fick for June 5 at 10:45 a.m.   LABORATORY, DIAGNOSTIC AND RADIOLOGICAL DATA:  WBC 8.5, hemoglobin 8.6, hematocrit 28.5, platelet count 248, MCV 64.   Sodium 141, potassium 3.4, chloride 106, bicarbonate 24, BUN 7, creatinine 0.64, glucose 91, calcium 8.2.   Serum iron 41, iron binding capacity 335, iron saturation 12%. B12 is at 375 pg/mL. Urinalysis negative for any infection. Pregnancy test on admission was positive with INR 1.1. Beta-hCG 4299. Ultrasound of right upper extremity showing thrombus in subclavian, axillary, brachial veins with occlusive thrombus noted in subclavian and brachial veins and nonoccluded thrombus in axillary vein. Superficial veins of arm including cephalic,  basilic and antecubital shows no thrombus.   Ultrasound transvaginal showing gestational sac and fetal pole within endometrial canal. However, fetal heart rate was not identified either due to early gestational age, however, because of Beta-hCG positivity it could be differential of early intrauterine pregnancy, failed intrauterine pregnancy, ectopic pregnancy. Small complex mass in right ovary, likely represents a corpus luteal cyst. Small mass in left ovary is indeterminate, but could represent a hemorrhagic cyst.   BRIEF HOSPITAL COURSE: Ms. Yott is a 29 year old obese Caucasian female with history of prothrombin gene mutation and family history of blood clots presents to the hospital complaining of right arm swelling, redness and also pain.  1. Right upper extremity deep venous thrombosis. Occlusive clot in right-sided subclavian and brachial veins and nonocclusive clot in the axillary vein on the right side. Known history of prothrombin gene mutation. Was on prophylactic Lovenox during her prior pregnancy. This time was too early that she didn't know that she was pregnant up until this happened. No use of oral contraceptive pills or other precipitating factors. Seen by OB/GYN because of her pregnancy, started on Lovenox b.i.d. and should continue on Lovenox sub-Q b.i.d. 1 mg/kg dose all through her pregnancy. She will need to be seen by hematologist postdelivery and will need to be on anticoagulation for life likely due to her prothrombin gene mutation carrier status.  2. Pregnancy related nausea, vomiting. Started on prenatal vitamin here in the hospital. Her last menstrual period was about 4 to 5 weeks ago so early pregnancy. Transvaginal ultrasound showed fetal pole and a gestational sac without good fetal heart beat. She will need a follow-up ultrasound in 1 to 2 weeks. She  was seen by OB/GYN in the hospital and will follow up with them in one week. She will need to have factor X a level monitored  for her Lovenox use because of her pregnancy. The factor X level will be drawn today prior to discharge. Also she will have a repeat beta-hCG done later today prior to discharge to see doubling to confirm viable intrauterine pregnancy. She was having some morning sickness symptoms and was started on Zofran oral disintegrating tablet which she will continue at discharge. Her overall course has been otherwise uneventful.   DISCHARGE CONDITION: Stable.   DISCHARGE DISPOSITION: Home.   TIME SPENT ON DISCHARGE: 45 minutes.   ____________________________ Gladstone Lighter, MD rk:cms D: 07/12/2011 13:20:15 ET T: 07/12/2011 16:43:48 ET JOB#: [id number]  cc: Gladstone Lighter, MD, <Dictator> Stoney Bang. Georgianne Fick, MD Gladstone Lighter MD ELECTRONICALLY SIGNED 07/17/2011 10:14

## 2014-06-08 LAB — SURGICAL PATHOLOGY

## 2014-06-14 NOTE — Op Note (Signed)
PATIENT NAME:  Diana Sexton, Diana Sexton MR#:  [id number] DATE OF BIRTH:  25-May-1985  DATE OF PROCEDURE:  02/26/2014  PREOPERATIVE DIAGNOSIS: Abnormal uterine bleeding.   POSTOPERATIVE DIAGNOSIS: Abnormal uterine bleeding.   PROCEDURES:  1.  Dilation and curettage.  2.  Hysteroscopy.   ANESTHESIA: General.   SURGEON:  Prentice Docker, MD.   ESTIMATED BLOOD LOSS: 25 mL.   OPERATIVE FLUIDS: 500 mL.   COMPLICATIONS: None.   FINDINGS: Normal-appearing uterine cavity.   SPECIMENS: Endometrial curettings.   CONDITION AT END OF PROCEDURE: Stable.   PROCEDURE IN DETAIL: The patient was taken to the operating room where general anesthesia was administered and found to be adequate. The patient was placed in the dorsal supine high lithotomy position and prepped and draped in the usual sterile fashion. After a timeout was called, an in and out catheterization was performed with return of approximately 100 mL of clear urine. A sterile speculum was placed in the vagina and a single-tooth tenaculum was used to grasp the anterior lip of the cervix. The cervix was dilated gently in a serial fashion using Hegar dilators to a dilatation of 7 mm. The hysteroscope was then gently introduced through the cervix into the uterine cavity with the above-noted findings without incident. The hysteroscope was removed and a single pass for global sampling of the endometrial lining was taken. The hysteroscope was reintroduced and there was a significant amount of debris in the uterine cavity, therefore the hysteroscope was removed and a second pass was taken with the blunt curette. The hysteroscope was reintroduced to the cavity and sufficient debris was removed and hemostasis was noted. The hysteroscope was removed. The single-tooth tenaculum was removed. Hemostasis was obtained using silver nitrate applied at each of the entry sites. At this point all other instrumentation was removed from the vagina.   The patient tolerated  the procedure well. Sponge, lap, and needle counts were correct x 2. For VTE prophylaxis the patient was wearing pneumatic compression stockings which were on and operating throughout the entire procedure. The patient was awakened in the operating room and taken to the recovery area in stable condition.    ____________________________ Will Bonnet, MD sdj:bu D: 02/26/2014 10:57:54 ET T: 02/26/2014 16:19:11 ET JOB#: [id number]  cc: Will Bonnet, MD, <Dictator> Will Bonnet MD ELECTRONICALLY SIGNED 03/18/2014 0:03

## 2014-10-13 ENCOUNTER — Encounter: Payer: Self-pay | Admitting: Family Medicine

## 2014-10-13 ENCOUNTER — Telehealth: Payer: Self-pay | Admitting: Family Medicine

## 2014-10-13 ENCOUNTER — Ambulatory Visit (INDEPENDENT_AMBULATORY_CARE_PROVIDER_SITE_OTHER): Payer: BLUE CROSS/BLUE SHIELD | Admitting: Family Medicine

## 2014-10-13 VITALS — BP 137/78 | HR 76 | Temp 98.5°F | Ht 65.0 in | Wt 310.0 lb

## 2014-10-13 DIAGNOSIS — M545 Low back pain, unspecified: Secondary | ICD-10-CM | POA: Insufficient documentation

## 2014-10-13 DIAGNOSIS — D6852 Prothrombin gene mutation: Secondary | ICD-10-CM | POA: Insufficient documentation

## 2014-10-13 DIAGNOSIS — Z862 Personal history of diseases of the blood and blood-forming organs and certain disorders involving the immune mechanism: Secondary | ICD-10-CM

## 2014-10-13 LAB — URINALYSIS, ROUTINE W REFLEX MICROSCOPIC
Bilirubin, UA: NEGATIVE
Glucose, UA: NEGATIVE
Ketones, UA: NEGATIVE
Leukocytes, UA: NEGATIVE
Nitrite, UA: NEGATIVE
PH UA: 5 (ref 5.0–7.5)
Specific Gravity, UA: 1.025 (ref 1.005–1.030)
Urobilinogen, Ur: 0.2 mg/dL (ref 0.2–1.0)

## 2014-10-13 LAB — MICROSCOPIC EXAMINATION: RBC, UA: >30 /hpf — AB (ref 0–?)

## 2014-10-13 MED ORDER — MELOXICAM 15 MG PO TABS: 15.0000 mg | ORAL_TABLET | Freq: Every day | ORAL | Status: DC

## 2014-10-13 NOTE — Progress Notes (Signed)
   BP 137/78 mmHg  Pulse 76  Temp(Src) 98.5 F (36.9 C)  Ht $R'5\' 5"'FM$  (1.651 m)  Wt 310 lb (140.615 kg)  BMI 51.59 kg/m2  SpO2 99%   Subjective:    Patient ID: Diana Sexton, female    DOB: [birth year], 29 y.o.   MRN: [id number]  HPI: Diana Sexton is a 29 y.o. female  Chief Complaint  Patient presents with  . 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 Patient's not tried over-the-counter medicines as doesn't like taking medicines No blood in stool or urine Has been ice and heat but due to lifestyle and caregiving and being a mother can't sit around.   Relevant past medical, surgical, family and social history reviewed and updated as indicated. Interim medical history since our last visit reviewed. Allergies and medications reviewed and updated.  Review of Systems  Constitutional: Negative.   Respiratory: Negative.   Cardiovascular: Negative.   Gastrointestinal: Positive for abdominal distention. Negative for nausea, abdominal pain, diarrhea, constipation and blood in stool.    Per HPI unless specifically indicated above     Objective:    BP 137/78 mmHg  Pulse 76  Temp(Src) 98.5 F (36.9 C)  Ht $R'5\' 5"'SY$  (1.651 m)  Wt 310 lb (140.615 kg)  BMI 51.59 kg/m2  SpO2 99%  Wt Readings from Last 3 Encounters:  10/13/14 310 lb (140.615 kg)  12/09/13 309 lb (140.161 kg)    Physical Exam  Constitutional: She is oriented to person, place, and time. She appears well-developed and well-nourished. No distress.  HENT:  Head: Normocephalic and atraumatic.  Right Ear: Hearing normal.  Left Ear: Hearing normal.  Nose: Nose normal.  Eyes: Conjunctivae and lids are normal. Right eye exhibits no discharge. Left eye exhibits no discharge. No scleral icterus.  Cardiovascular: Normal rate, regular rhythm and normal heart sounds.    Pulmonary/Chest: Effort normal and breath sounds normal. No respiratory distress.  Musculoskeletal: Normal range of motion.  Patient with full range of motion and normal neurological exam  Neurological: She is alert and oriented to person, place, and time.  Skin: Skin is intact. No rash noted.  Psychiatric: She has a normal mood and affect. Her speech is normal and behavior is normal. Judgment and thought content normal. Cognition and memory are normal.        Assessment & Plan:   Problem List Items Addressed This Visit      Other   Low back pain - Primary    Discuss back pain care and treatment need for proper lifting technique exercise strengthening. Discussed weight loss Discuss limited use of medications       Relevant Medications   meloxicam (MOBIC) 15 MG tablet   Other Relevant Orders   Urinalysis, Routine w reflex microscopic (not at [facility])   History of prothrombin mutation    Discuss blood clotting risk and okay to use nonsteroidal medications          Follow up plan: Return for Physical Exam.

## 2014-10-13 NOTE — Telephone Encounter (Signed)
Pt added to MAC's schedule for today @ 2:45pm. Thanks.

## 2014-10-13 NOTE — Assessment & Plan Note (Signed)
Discuss back pain care and treatment need for proper lifting technique exercise strengthening. Discussed weight loss Discuss limited use of medications

## 2014-10-13 NOTE — Assessment & Plan Note (Signed)
Discuss blood clotting risk and okay to use nonsteroidal medications

## 2014-12-11 ENCOUNTER — Ambulatory Visit (INDEPENDENT_AMBULATORY_CARE_PROVIDER_SITE_OTHER): Payer: BLUE CROSS/BLUE SHIELD | Admitting: Family Medicine

## 2014-12-11 ENCOUNTER — Encounter: Payer: Self-pay | Admitting: Unknown Physician Specialty

## 2014-12-11 VITALS — BP 136/77 | HR 73 | Temp 98.3°F | Ht 65.1 in | Wt 306.8 lb

## 2014-12-11 DIAGNOSIS — D485 Neoplasm of uncertain behavior of skin: Secondary | ICD-10-CM | POA: Diagnosis not present

## 2014-12-11 DIAGNOSIS — G4700 Insomnia, unspecified: Secondary | ICD-10-CM

## 2014-12-11 DIAGNOSIS — Z23 Encounter for immunization: Secondary | ICD-10-CM | POA: Diagnosis not present

## 2014-12-11 DIAGNOSIS — R319 Hematuria, unspecified: Secondary | ICD-10-CM | POA: Insufficient documentation

## 2014-12-11 DIAGNOSIS — M545 Low back pain, unspecified: Secondary | ICD-10-CM

## 2014-12-11 DIAGNOSIS — Z862 Personal history of diseases of the blood and blood-forming organs and certain disorders involving the immune mechanism: Secondary | ICD-10-CM | POA: Diagnosis not present

## 2014-12-11 DIAGNOSIS — Z0000 Encounter for general adult medical examination without abnormal findings: Secondary | ICD-10-CM | POA: Diagnosis not present

## 2014-12-11 MED ORDER — TRAZODONE HCL 50 MG PO TABS: 50.0000 mg | ORAL_TABLET | Freq: Every evening | ORAL | Status: DC | PRN

## 2014-12-11 NOTE — Progress Notes (Signed)
Patient ID: Diana Sexton, female   DOB: 1986-01-11, 29 y.o.   MRN: [id number]   Subjective:   Diana Sexton is a 29 y.o. female here for a complete physical exam  Interim issues since last visit: she was seen here in August for low back pain; not sure if low back pain from Mirena or cramping; still bothering her some days, some days are completely fine; not really changing with moving or bending or twisting; weird; associated with cramping; going on for 4-5 months; no blood in urine; he checked urine last time and we reviewed those last time; she had 3+ blood in the urine dip and >30 RBCs/hpf; she has not had periods since being on Mirena; she has a history of kidney stones and father had kidney stones; GYN did pelvic US; grandmother had endometriosis  She goes to GYN for family planning and pap smears and breast exams  USPSTF grade A and B recommendations Alcohol: occasional Depression: has seen psychiatrist, also had anxiety; they had to reschedule Depression screen [facility] 2/9 12/11/2014  Decreased Interest 0  Down, Depressed, Hopeless 1  PHQ - 2 Score 1  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; no recent SI/HI; great-grandmother and mother have struggled with depression Hypertension: systolic 829, above goal Obesity: her thyroid is fine, she has been tested "a million times" Tobacco use: no HIV, hep B, hep C: declined STD testing and prevention (chl/gon/syphilis): declined Lipids: check fasting Glucose: check fasting Colorectal cancer: no early cancer Breast cancer: mother, grandmother, great-grandmother, great-great-grandmother BRCA gene screening: already done and negative Intimate partner violence: no Cervical cancer screening: through GYN Lung cancer: n/a Osteoporosis: n/a Fall prevention/vitamin D: has been to take vit D in the past Diet: typical American diet Exercise: active watching her children Skin cancer: no tanning beds, grandfather has had tons of skin cancer, fair  complected  Hx of anemia, check today; used to take iron supplementation  Past Medical History  Diagnosis Date  . Obesity   . Dysmenorrhea   . DVT (deep venous thrombosis) ([place]) 2013  . Cellulitis   . History of prothrombin mutation    Past Surgical History  Procedure Laterality Date  . Cholecystectomy    . Tubal ligation    . Tonsillectomy and adenoidectomy    . Lithotripsy    . Dilitation & currettage/hystroscopy with essure    . Intrauterine device (iud) insertion     Family History  Problem Relation Age of Onset  . Cancer Mother     breast  . Diabetes Father   . Hypertension Father   . Heart disease Maternal Grandfather   . Dementia Paternal Grandmother    Social History  Substance Use Topics  . Smoking status: Never Smoker   . Smokeless tobacco: Never Used  . Alcohol Use: Yes     Comment: rare   Review of Systems  Constitutional: Positive for fatigue (some days).  HENT: Negative for sore throat.   Eyes: Negative for visual disturbance.       Astigmatism in one eye  Respiratory: Positive for shortness of breath (if overexerting, but not sitting still).   Cardiovascular: Negative for palpitations.  Gastrointestinal: Negative for diarrhea and constipation.  Endocrine: Negative for polydipsia.  Genitourinary: Negative for dysuria and hematuria.  Musculoskeletal: Positive for back pain.  Skin:       Mole on philtrum is growing; has bled  Neurological: Negative for tremors.  Psychiatric/Behavioral: Positive for sleep disturbance (has been an issue for a while; watches TV)  and decreased concentration (lots on her plate). Negative for self-injury and dysphoric mood.   Objective:   Filed Vitals:   12/11/14 0954  BP: 136/77  Pulse: 73  Temp: 98.3 F (36.8 C)  Height: 5' 5.1" (1.654 m)  Weight: 306 lb 12.8 oz (139.164 kg)  SpO2: 99%   Body mass index is 50.87 kg/(m^2). Wt Readings from Last 3 Encounters:  12/11/14 306 lb 12.8 oz (139.164 kg)  10/13/14 310  lb (140.615 kg)  12/09/13 309 lb (140.161 kg)   Physical Exam  Constitutional: She appears well-developed and well-nourished.  HENT:  Head: Normocephalic and atraumatic.  Right Ear: Hearing, tympanic membrane, external ear and ear canal normal.  Left Ear: Hearing, tympanic membrane, external ear and ear canal normal.  Eyes: Conjunctivae and EOM are normal. Right eye exhibits no hordeolum. Left eye exhibits no hordeolum. No scleral icterus.  Neck: Carotid bruit is not present. No thyromegaly present.  Cardiovascular: Normal rate, regular rhythm, S1 normal, S2 normal and normal heart sounds.   No extrasystoles are present.  Pulmonary/Chest: Effort normal and breath sounds normal. No respiratory distress. Right breast exhibits no inverted nipple, no mass, no nipple discharge, no skin change and no tenderness. Left breast exhibits no inverted nipple, no mass, no nipple discharge, no skin change and no tenderness. Breasts are symmetrical.  Abdominal: Soft. Normal appearance and bowel sounds are normal. She exhibits no distension, no abdominal bruit, no pulsatile midline mass and no mass. There is no hepatosplenomegaly. There is no tenderness. No hernia.  Musculoskeletal: Normal range of motion. She exhibits no edema.  Lymphadenopathy:       Head (right side): No submandibular adenopathy present.       Head (left side): No submandibular adenopathy present.    She has no cervical adenopathy.    She has no axillary adenopathy.  Neurological: She is alert. She displays no tremor. No cranial nerve deficit. She exhibits normal muscle tone. Gait normal.  Reflex Scores:      Patellar reflexes are 2+ on the right side and 2+ on the left side. Skin: Skin is warm and dry. No bruising and no ecchymosis noted. No cyanosis. No pallor.  Nevus on right side philtrum, under right nostril; flesh-colored but slightly irregular  Psychiatric: Her speech is normal and behavior is normal. Thought content normal. Her  mood appears not anxious. She does not exhibit a depressed mood.    Assessment/Plan:   Problem List Items Addressed This Visit      Other   Low back pain    Going on for 4-5 months; urine last visit reviewed; hematuria present; will get CT scan      Relevant Orders   CT Abdomen Pelvis W Contrast   History of prothrombin mutation    Avoid soy; heterozygous      Morbid obesity ([place])    Supportive listening; see AVS; work on weight loss      Relevant Orders   Amb ref to Medical Nutrition Therapy-MNT   Hematuria    Noted, present on recheck; with low back pain, will get CT scan to look for renal carcinoma      Relevant Orders   CT Abdomen Pelvis W Contrast   Preventative health care - Primary    USPSTF grade A and B recommendations reviewed with patient; age-appropriate recommendations, preventive care, screening tests, etc discussed and encouraged; healthy living encouraged; see AVS for patient education given to patient      Insomnia  Discussed; avoid electronics for 2 hours before bed; good sleep hygiene; trial of trazodone; return for reassessment in 4 weeks       Other Visit Diagnoses    Immunization due        flu vaccine given    Relevant Orders    Flu Vaccine QUAD 36+ mos PF IM (Fluarix & Fluzone Quad PF) (Completed)    Neoplasm of uncertain behavior of skin of face        refer to derm    Relevant Orders    Ambulatory referral to Dermatology       Orders Placed This Encounter  Procedures  . CT Abdomen Pelvis W Contrast    Order Specific Question:  If indicated for the ordered procedure, I authorize the administration of contrast media per Radiology protocol    Answer:  Yes    Order Specific Question:  Reason for Exam (SYMPTOM  OR DIAGNOSIS REQUIRED)    Answer:  hematuria    Order Specific Question:  Is the patient pregnant?    Answer:  No    Order Specific Question:  Preferred imaging location?    Answer:   [facility]  . Flu Vaccine QUAD 36+  mos PF IM (Fluarix & Fluzone Quad PF)  . Amb ref to Medical Nutrition Therapy-MNT    Referral Priority:  Routine    Referral Type:  Consultation    Referral Reason:  Specialty Services Required    Requested Specialty:  Nutrition    Number of Visits Requested:  1  . Ambulatory referral to Dermatology    Referral Priority:  Routine    Referral Type:  Consultation    Referral Reason:  Specialty Services Required    Requested Specialty:  Dermatology    Number of Visits Requested:  1   Follow up plan: Return in about 1 year (around 12/11/2015) for complete physical, and 4 weeks for insomnia and back pain and mood.  An after-visit summary was printed and given to the patient at [facility].  Please see the patient instructions which may contain other information and recommendations beyond what is mentioned above in the assessment and plan.  Meds ordered this encounter  Medications  . traZODone (DESYREL) 50 MG tablet    Sig: Take 1 tablet (50 mg total) by mouth at bedtime as needed for sleep.    Dispense:  30 tablet    Refill:  3

## 2014-12-11 NOTE — Assessment & Plan Note (Signed)
Avoid soy; heterozygous

## 2014-12-11 NOTE — Assessment & Plan Note (Addendum)
Going on for 4-5 months; urine last visit reviewed; hematuria present; will get CT scan

## 2014-12-11 NOTE — Patient Instructions (Addendum)
We'll have you see the dermatologist about the mole on your face We'll get a CT scan to evaluate the blood in the urine and your back pain Try the new medicine at night for sleep No electronics for two hours before bed Return next week for fasting labs You received the flu shot today; it should protect you against the flu virus over the coming months; it will take about two weeks for antibodies to develop; do try to stay away from hospitals, nursing homes, and daycares during peak flu season; taking 1000 mg of vitamin C daily during flu season may help you avoid getting sick Check out the information at familydoctor.org entitled "What It Takes to Lose Weight" Try to lose between 1-2 pounds per week by taking in fewer calories and burning off more calories You can succeed by limiting portions, limiting foods dense in calories and fat, becoming more active, and drinking 8 glasses of water a day (64 ounces) Don't skip meals, especially breakfast, as skipping meals may alter your metabolism Do not use over-the-counter weight loss pills or gimmicks that claim rapid weight loss A healthy BMI (or body mass index) is between 18.5 and 24.9 You can calculate your ideal BMI at the [place] website [employer] If your mood worsens, please call or make an appointment  Health Maintenance, Female Adopting a healthy lifestyle and getting preventive care can go a long way to promote health and wellness. Talk with your health care provider about what schedule of regular examinations is right for you. This is a good chance for you to check in with your provider about disease prevention and staying healthy. In between checkups, there are plenty of things you can do on your own. Experts have done a lot of research about which lifestyle changes and preventive measures are most likely to keep you healthy. Ask your health care provider for more information. WEIGHT AND  DIET  Eat a healthy diet  Be sure to include plenty of vegetables, fruits, low-fat dairy products, and lean protein.  Do not eat a lot of foods high in solid fats, added sugars, or salt.  Get regular exercise. This is one of the most important things you can do for your health.  Most adults should exercise for at least 150 minutes each week. The exercise should increase your heart rate and make you sweat (moderate-intensity exercise).  Most adults should also do strengthening exercises at least twice a week. This is in addition to the moderate-intensity exercise.  Maintain a healthy weight  Body mass index (BMI) is a measurement that can be used to identify possible weight problems. It estimates body fat based on height and weight. Your health care provider can help determine your BMI and help you achieve or maintain a healthy weight.  For females 63 years of age and older:   A BMI below 18.5 is considered underweight.  A BMI of 18.5 to 24.9 is normal.  A BMI of 25 to 29.9 is considered overweight.  A BMI of 30 and above is considered obese.  Watch levels of cholesterol and blood lipids  You should start having your blood tested for lipids and cholesterol at 28 years of age, then have this test every 5 years.  You may need to have your cholesterol levels checked more often if:  Your lipid or cholesterol levels are high.  You are older than 28 years of age.  You are at high risk for heart disease.  CANCER SCREENING  Lung Cancer  Lung cancer screening is recommended for adults 30-8 years old who are at high risk for lung cancer because of a history of smoking.  A yearly low-dose CT scan of the lungs is recommended for people who:  Currently smoke.  Have quit within the past 15 years.  Have at least a 30-pack-year history of smoking. A pack year is smoking an average of one pack of cigarettes a day for 1 year.  Yearly screening should continue until it has been  15 years since you quit.  Yearly screening should stop if you develop a health problem that would prevent you from having lung cancer treatment.  Breast Cancer  Practice breast self-awareness. This means understanding how your breasts normally appear and feel.  It also means doing regular breast self-exams. Let your health care provider know about any changes, no matter how small.  If you are in your 20s or 30s, you should have a clinical breast exam (CBE) by a health care provider every 1-3 years as part of a regular health exam.  If you are 55 or older, have a CBE every year. Also consider having a breast X-ray (mammogram) every year.  If you have a family history of breast cancer, talk to your health care provider about genetic screening.  If you are at high risk for breast cancer, talk to your health care provider about having an MRI and a mammogram every year.  Breast cancer gene (BRCA) assessment is recommended for women who have family members with BRCA-related cancers. BRCA-related cancers include:  Breast.  Ovarian.  Tubal.  Peritoneal cancers.  Results of the assessment will determine the need for genetic counseling and BRCA1 and BRCA2 testing. Cervical Cancer Your health care provider may recommend that you be screened regularly for cancer of the pelvic organs (ovaries, uterus, and vagina). This screening involves a pelvic examination, including checking for microscopic changes to the surface of your cervix (Pap test). You may be encouraged to have this screening done every 3 years, beginning at age 83.  For women ages 70-65, health care providers may recommend pelvic exams and Pap testing every 3 years, or they may recommend the Pap and pelvic exam, combined with testing for human papilloma virus (HPV), every 5 years. Some types of HPV increase your risk of cervical cancer. Testing for HPV may also be done on women of any age with unclear Pap test results.  Other health  care providers may not recommend any screening for nonpregnant women who are considered low risk for pelvic cancer and who do not have symptoms. Ask your health care provider if a screening pelvic exam is right for you.  If you have had past treatment for cervical cancer or a condition that could lead to cancer, you need Pap tests and screening for cancer for at least 20 years after your treatment. If Pap tests have been discontinued, your risk factors (such as having a new sexual partner) need to be reassessed to determine if screening should resume. Some women have medical problems that increase the chance of getting cervical cancer. In these cases, your health care provider may recommend more frequent screening and Pap tests. Colorectal Cancer  This type of cancer can be detected and often prevented.  Routine colorectal cancer screening usually begins at 28 years of age and continues through 28 years of age.  Your health care provider may recommend screening at an earlier age if you have risk factors for colon cancer.  Your health care provider may also recommend using home test kits to check for hidden blood in the stool.  A small camera at the end of a tube can be used to examine your colon directly (sigmoidoscopy or colonoscopy). This is done to check for the earliest forms of colorectal cancer.  Routine screening usually begins at age 61.  Direct examination of the colon should be repeated every 5-10 years through 28 years of age. However, you may need to be screened more often if early forms of precancerous polyps or small growths are found. Skin Cancer  Check your skin from head to toe regularly.  Tell your health care provider about any new moles or changes in moles, especially if there is a change in a mole's shape or color.  Also tell your health care provider if you have a mole that is larger than the size of a pencil eraser.  Always use sunscreen. Apply sunscreen liberally and  repeatedly throughout the day.  Protect yourself by wearing long sleeves, pants, a wide-brimmed hat, and sunglasses whenever you are outside. HEART DISEASE, DIABETES, AND HIGH BLOOD PRESSURE   High blood pressure causes heart disease and increases the risk of stroke. High blood pressure is more likely to develop in:  People who have blood pressure in the high end of the normal range (130-139/85-89 mm Hg).  People who are overweight or obese.  People who are African American.  If you are 51-62 years of age, have your blood pressure checked every 3-5 years. If you are 65 years of age or older, have your blood pressure checked every year. You should have your blood pressure measured twice--once when you are at a hospital or clinic, and once when you are not at a hospital or clinic. Record the average of the two measurements. To check your blood pressure when you are not at a hospital or clinic, you can use:  An automated blood pressure machine at a pharmacy.  A home blood pressure monitor.  If you are between 62 years and 15 years old, ask your health care provider if you should take aspirin to prevent strokes.  Have regular diabetes screenings. This involves taking a blood sample to check your fasting blood sugar level.  If you are at a normal weight and have a low risk for diabetes, have this test once every three years after 28 years of age.  If you are overweight and have a high risk for diabetes, consider being tested at a younger age or more often. PREVENTING INFECTION  Hepatitis B  If you have a higher risk for hepatitis B, you should be screened for this virus. You are considered at high risk for hepatitis B if:  You were born in a country where hepatitis B is common. Ask your health care provider which countries are considered high risk.  Your parents were born in a high-risk country, and you have not been immunized against hepatitis B (hepatitis B vaccine).  You have HIV or  AIDS.  You use needles to inject street drugs.  You live with someone who has hepatitis B.  You have had sex with someone who has hepatitis B.  You get hemodialysis treatment.  You take certain medicines for conditions, including cancer, organ transplantation, and autoimmune conditions. Hepatitis C  Blood testing is recommended for:  Everyone born from [birth year] through 1964.  Anyone with known risk factors for hepatitis C. Sexually transmitted infections (STIs)  You should be screened for sexually  transmitted infections (STIs) including gonorrhea and chlamydia if:  You are sexually active and are younger than 28 years of age.  You are older than 28 years of age and your health care provider tells you that you are at risk for this type of infection.  Your sexual activity has changed since you were last screened and you are at an increased risk for chlamydia or gonorrhea. Ask your health care provider if you are at risk.  If you do not have HIV, but are at risk, it may be recommended that you take a prescription medicine daily to prevent HIV infection. This is called pre-exposure prophylaxis (PrEP). You are considered at risk if:  You are sexually active and do not regularly use condoms or know the HIV status of your partner(s).  You take drugs by injection.  You are sexually active with a partner who has HIV. Talk with your health care provider about whether you are at high risk of being infected with HIV. If you choose to begin PrEP, you should first be tested for HIV. You should then be tested every 3 months for as long as you are taking PrEP.  PREGNANCY   If you are premenopausal and you may become pregnant, ask your health care provider about preconception counseling.  If you may become pregnant, take 400 to 800 micrograms (mcg) of folic acid every day.  If you want to prevent pregnancy, talk to your health care provider about birth control (contraception). OSTEOPOROSIS AND  MENOPAUSE   Osteoporosis is a disease in which the bones lose minerals and strength with aging. This can result in serious bone fractures. Your risk for osteoporosis can be identified using a bone density scan.  If you are 11 years of age or older, or if you are at risk for osteoporosis and fractures, ask your health care provider if you should be screened.  Ask your health care provider whether you should take a calcium or vitamin D supplement to lower your risk for osteoporosis.  Menopause may have certain physical symptoms and risks.  Hormone replacement therapy may reduce some of these symptoms and risks. Talk to your health care provider about whether hormone replacement therapy is right for you.  HOME CARE INSTRUCTIONS   Schedule regular health, dental, and eye exams.  Stay current with your immunizations.   Do not use any tobacco products including cigarettes, chewing tobacco, or electronic cigarettes.  If you are pregnant, do not drink alcohol.  If you are breastfeeding, limit how much and how often you drink alcohol.  Limit alcohol intake to no more than 1 drink per day for nonpregnant women. One drink equals 12 ounces of beer, 5 ounces of wine, or 1 ounces of hard liquor.  Do not use street drugs.  Do not share needles.  Ask your health care provider for help if you need support or information about quitting drugs.  Tell your health care provider if you often feel depressed.  Tell your health care provider if you have ever been abused or do not feel safe at home.   This information is not intended to replace advice given to you by your health care provider. Make sure you discuss any questions you have with your health care provider.   Document Released: 08/15/2010 Document Revised: 02/20/2014 Document Reviewed: 01/01/2013 Elsevier Interactive Patient Education 2016 Else[employer] Insomnia Insomnia is a sleep disorder that makes it difficult to fall asleep or to  stay asleep. Insomnia can cause tiredness (  fatigue), low energy, difficulty concentrating, mood swings, and poor performance at work or school.  There are three different ways to classify insomnia:  Difficulty falling asleep.  Difficulty staying asleep.  Waking up too early in the morning. Any type of insomnia can be long-term (chronic) or short-term (acute). Both are common. Short-term insomnia usually lasts for three months or less. Chronic insomnia occurs at least three times a week for longer than three months. CAUSES  Insomnia may be caused by another condition, situation, or substance, such as:  Anxiety.  Certain medicines.  Gastroesophageal reflux disease (GERD) or other gastrointestinal conditions.  Asthma or other breathing conditions.  Restless legs syndrome, sleep apnea, or other sleep disorders.  Chronic pain.  Menopause. This may include hot flashes.  Stroke.  Abuse of alcohol, tobacco, or illegal drugs.  Depression.  Caffeine.   Neurological disorders, such as Alzheimer disease.  An overactive thyroid (hyperthyroidism). The cause of insomnia may not be known. RISK FACTORS Risk factors for insomnia include:  Gender. Women are more commonly affected than men.  Age. Insomnia is more common as you get older.  Stress. This may involve your professional or personal life.  Income. Insomnia is more common in people with lower income.  Lack of exercise.   Irregular work schedule or night shifts.  Traveling between different time zones. SIGNS AND SYMPTOMS If you have insomnia, trouble falling asleep or trouble staying asleep is the main symptom. This may lead to other symptoms, such as:  Feeling fatigued.  Feeling nervous about going to sleep.  Not feeling rested in the morning.  Having trouble concentrating.  Feeling irritable, anxious, or depressed. TREATMENT  Treatment for insomnia depends on the cause. If your insomnia is caused by an  underlying condition, treatment will focus on addressing the condition. Treatment may also include:   Medicines to help you sleep.  Counseling or therapy.  Lifestyle adjustments. HOME CARE INSTRUCTIONS   Take medicines only as directed by your health care provider.  Keep regular sleeping and waking hours. Avoid naps.  Keep a sleep diary to help you and your health care provider figure out what could be causing your insomnia. Include:   When you sleep.  When you wake up during the night.  How well you sleep.   How rested you feel the next day.  Any side effects of medicines you are taking.  What you eat and drink.   Make your bedroom a comfortable place where it is easy to fall asleep:  Put up shades or special blackout curtains to block light from outside.  Use a white noise machine to block noise.  Keep the temperature cool.   Exercise regularly as directed by your health care provider. Avoid exercising right before bedtime.  Use relaxation techniques to manage stress. Ask your health care provider to suggest some techniques that may work well for you. These may include:  Breathing exercises.  Routines to release muscle tension.  Visualizing peaceful scenes.  Cut back on alcohol, caffeinated beverages, and cigarettes, especially close to bedtime. These can disrupt your sleep.  Do not overeat or eat spicy foods right before bedtime. This can lead to digestive discomfort that can make it hard for you to sleep.  Limit screen use before bedtime. This includes:  Watching TV.  Using your smartphone, tablet, and computer.  Stick to a routine. This can help you fall asleep faster. Try to do a quiet activity, brush your teeth, and go to bed at the  same time each night.  Get out of bed if you are still awake after 15 minutes of trying to sleep. Keep the lights down, but try reading or doing a quiet activity. When you feel sleepy, go back to bed.  Make sure that  you drive carefully. Avoid driving if you feel very sleepy.  Keep all follow-up appointments as directed by your health care provider. This is important. SEEK MEDICAL CARE IF:   You are tired throughout the day or have trouble in your daily routine due to sleepiness.  You continue to have sleep problems or your sleep problems get worse. SEEK IMMEDIATE MEDICAL CARE IF:   You have serious thoughts about hurting yourself or someone else.   This information is not intended to replace advice given to you by your health care provider. Make sure you discuss any questions you have with your health care provider.   Document Released: 01/28/2000 Document Revised: 10/21/2014 Document Reviewed: 10/31/2013 Elsevier Interactive Patient Education [employer].

## 2014-12-11 NOTE — Assessment & Plan Note (Addendum)
Supportive listening; see AVS; work on weight loss

## 2014-12-14 ENCOUNTER — Other Ambulatory Visit: Payer: BLUE CROSS/BLUE SHIELD

## 2014-12-16 DIAGNOSIS — G4700 Insomnia, unspecified: Secondary | ICD-10-CM | POA: Insufficient documentation

## 2014-12-16 NOTE — Assessment & Plan Note (Signed)
Noted, present on recheck; with low back pain, will get CT scan to look for renal carcinoma

## 2014-12-16 NOTE — Assessment & Plan Note (Signed)
USPSTF grade A and B recommendations reviewed with patient; age-appropriate recommendations, preventive care, screening tests, etc discussed and encouraged; healthy living encouraged; see AVS for patient education given to patient  

## 2014-12-16 NOTE — Assessment & Plan Note (Addendum)
Discussed; avoid electronics for 2 hours before bed; good sleep hygiene; trial of trazodone; return for reassessment in 4 weeks

## 2014-12-17 ENCOUNTER — Other Ambulatory Visit: Payer: Self-pay | Admitting: Family Medicine

## 2014-12-17 DIAGNOSIS — M545 Low back pain: Secondary | ICD-10-CM

## 2014-12-17 DIAGNOSIS — Z0000 Encounter for general adult medical examination without abnormal findings: Secondary | ICD-10-CM

## 2014-12-17 DIAGNOSIS — R319 Hematuria, unspecified: Secondary | ICD-10-CM

## 2014-12-17 NOTE — Assessment & Plan Note (Signed)
Recheck urine, then CT scan

## 2014-12-17 NOTE — Assessment & Plan Note (Signed)
Labs ordered.

## 2014-12-21 ENCOUNTER — Other Ambulatory Visit: Payer: BLUE CROSS/BLUE SHIELD

## 2014-12-22 ENCOUNTER — Telehealth: Payer: Self-pay | Admitting: Family Medicine

## 2014-12-22 ENCOUNTER — Other Ambulatory Visit: Payer: BLUE CROSS/BLUE SHIELD

## 2014-12-22 DIAGNOSIS — E786 Lipoprotein deficiency: Secondary | ICD-10-CM

## 2014-12-22 DIAGNOSIS — E7800 Pure hypercholesterolemia, unspecified: Secondary | ICD-10-CM

## 2014-12-22 LAB — UA/M W/RFLX CULTURE, ROUTINE: Bacteria, UA: NONE SEEN

## 2014-12-22 LAB — MICROSCOPIC EXAMINATION: WBC, UA: NONE SEEN /hpf (ref 0–?)

## 2014-12-22 NOTE — Telephone Encounter (Signed)
Insurance will not cover nutritionist for obesity, would need a dx of DM of hypercholesteremia etc.

## 2014-12-23 LAB — COMPREHENSIVE METABOLIC PANEL
ALBUMIN: 3.9 g/dL (ref 3.5–5.5)
ALT: 17 IU/L (ref 0–32)
AST: 22 IU/L (ref 0–40)
Albumin/Globulin Ratio: 1.3 (ref 1.1–2.5)
Alkaline Phosphatase: 79 IU/L (ref 39–117)
BILIRUBIN TOTAL: 0.3 mg/dL (ref 0.0–1.2)
BUN / CREAT RATIO: 12 (ref 8–20)
BUN: 7 mg/dL (ref 6–20)
CALCIUM: 9.4 mg/dL (ref 8.7–10.2)
CHLORIDE: 96 mmol/L — AB (ref 97–106)
CO2: 21 mmol/L (ref 18–29)
Creatinine, Ser: 0.58 mg/dL (ref 0.57–1.00)
GFR, EST AFRICAN AMERICAN: 145 mL/min/{1.73_m2} (ref >59)
GFR, EST NON AFRICAN AMERICAN: 126 mL/min/{1.73_m2} (ref >59)
Globulin, Total: 3 g/dL (ref 1.5–4.5)
Glucose: 87 mg/dL (ref 65–99)
POTASSIUM: 4.7 mmol/L (ref 3.5–5.2)
Sodium: 138 mmol/L (ref 136–144)
TOTAL PROTEIN: 6.9 g/dL (ref 6.0–8.5)

## 2014-12-23 LAB — CBC WITH DIFFERENTIAL/PLATELET
BASOS: 0 %
Basophils Absolute: 0 10*3/uL (ref 0.0–0.2)
EOS (ABSOLUTE): 0.2 10*3/uL (ref 0.0–0.4)
Eos: 2 %
HEMOGLOBIN: 12.5 g/dL (ref 11.1–15.9)
Hematocrit: 38.7 % (ref 34.0–46.6)
Immature Grans (Abs): 0 10*3/uL (ref 0.0–0.1)
Immature Granulocytes: 0 %
LYMPHS ABS: 2.1 10*3/uL (ref 0.7–3.1)
LYMPHS: 27 %
MCH: 22.7 pg — AB (ref 26.6–33.0)
MCHC: 32.3 g/dL (ref 31.5–35.7)
MCV: 70 fL — AB (ref 79–97)
MONOCYTES: 5 %
Monocytes Absolute: 0.4 10*3/uL (ref 0.1–0.9)
NEUTROS ABS: 5.3 10*3/uL (ref 1.4–7.0)
Neutrophils: 66 %
Platelets: 417 10*3/uL — ABNORMAL HIGH (ref 150–379)
RBC: 5.51 x10E6/uL — ABNORMAL HIGH (ref 3.77–5.28)
RDW: 16.6 % — ABNORMAL HIGH (ref 12.3–15.4)
WBC: 8 10*3/uL (ref 3.4–10.8)

## 2014-12-23 LAB — LIPID PANEL W/O CHOL/HDL RATIO
Cholesterol, Total: 185 mg/dL (ref 100–199)
HDL: 34 mg/dL — AB (ref >39)
LDL CALC: 132 mg/dL — AB (ref 0–99)
Triglycerides: 97 mg/dL (ref 0–149)
VLDL CHOLESTEROL CAL: 19 mg/dL (ref 5–40)

## 2014-12-23 LAB — TSH: TSH: 1.53 u[IU]/mL (ref 0.450–4.500)

## 2014-12-25 ENCOUNTER — Ambulatory Visit
Admission: RE | Admit: 2014-12-25 | Discharge: 2014-12-25 | Disposition: A | Discharge status: Home / Self Care | Payer: BLUE CROSS/BLUE SHIELD | Source: Ambulatory Visit | Attending: Family Medicine | Admitting: Family Medicine

## 2014-12-25 ENCOUNTER — Telehealth: Payer: Self-pay | Admitting: Family Medicine

## 2014-12-25 DIAGNOSIS — R319 Hematuria, unspecified: Secondary | ICD-10-CM | POA: Diagnosis present

## 2014-12-25 DIAGNOSIS — M545 Low back pain: Secondary | ICD-10-CM | POA: Insufficient documentation

## 2014-12-25 DIAGNOSIS — N200 Calculus of kidney: Secondary | ICD-10-CM | POA: Insufficient documentation

## 2014-12-25 DIAGNOSIS — K429 Umbilical hernia without obstruction or gangrene: Secondary | ICD-10-CM | POA: Diagnosis not present

## 2014-12-25 MED ORDER — IOHEXOL 300 MG/ML  SOLN
150.0000 mL | Freq: Once | INTRAMUSCULAR | Status: AC | PRN
  Administered 2014-12-25: 150 mL via INTRAVENOUS

## 2014-12-25 NOTE — Telephone Encounter (Signed)
Called to let patient know the results of CT. Several small kidney stones, but nothing obstructing at this time. Small umbilical hernia- but not that she has noticed. Otherwise normal CT. She notes that she is still having back pain. Will follow up with Dr. Sanda Klein on her return.

## 2014-12-25 NOTE — Telephone Encounter (Signed)
Dr. Sanda Klein, do you know of any other codes to try?

## 2015-01-01 DIAGNOSIS — E786 Lipoprotein deficiency: Secondary | ICD-10-CM | POA: Insufficient documentation

## 2015-01-01 DIAGNOSIS — E7800 Pure hypercholesterolemia, unspecified: Secondary | ICD-10-CM | POA: Insufficient documentation

## 2015-01-01 NOTE — Telephone Encounter (Signed)
She has low HDL and slightly elevated LDL, so she technically fits the criteria; I entered those new codes into the problem list

## 2015-01-04 ENCOUNTER — Telehealth: Payer: Self-pay | Admitting: Family Medicine

## 2015-01-04 DIAGNOSIS — R9341 Abnormal radiologic findings on diagnostic imaging of renal pelvis, ureter, or bladder: Secondary | ICD-10-CM

## 2015-01-04 DIAGNOSIS — N200 Calculus of kidney: Secondary | ICD-10-CM

## 2015-01-04 DIAGNOSIS — R319 Hematuria, unspecified: Secondary | ICD-10-CM

## 2015-01-04 NOTE — Telephone Encounter (Signed)
Not sure why note is open; this was opened when I was out of the office I am closing this note

## 2015-01-04 NOTE — Telephone Encounter (Signed)
I left message, calling about scan

## 2015-01-06 NOTE — Telephone Encounter (Signed)
I spoke with the [facility]. I updated her referral with new diagnosis codes. They will call her back to schedule.

## 2015-01-14 ENCOUNTER — Ambulatory Visit: Payer: BLUE CROSS/BLUE SHIELD | Admitting: Family Medicine

## 2015-01-15 DIAGNOSIS — R9341 Abnormal radiologic findings on diagnostic imaging of renal pelvis, ureter, or bladder: Secondary | ICD-10-CM | POA: Insufficient documentation

## 2015-01-15 DIAGNOSIS — N200 Calculus of kidney: Secondary | ICD-10-CM | POA: Insufficient documentation

## 2015-01-15 NOTE — Telephone Encounter (Signed)
I talked with patient about CT scan findings; refer back to Dr. Jacqlyn Larsen; abnormal appearance, numerous stones She asked if low back pain could be from DDD; I don't see mention of that in report, but will ask radiologist to comment Patient to call me back Wednesday if she has not heard back (my default in case radiologist doesn't get to request before then)

## 2015-01-15 NOTE — Assessment & Plan Note (Signed)
Persistent; abnormal CT scan findings; refer back to Dr. Jacqlyn Larsen (urologist)

## 2015-01-18 ENCOUNTER — Telehealth: Payer: Self-pay | Admitting: Family Medicine

## 2015-01-18 NOTE — Telephone Encounter (Signed)
Please let patient know that the radiologist did NOT find any evidence of degenerative disc disease on her CT scan Thank you

## 2015-01-18 NOTE — Telephone Encounter (Signed)
Patient notified

## 2015-01-18 NOTE — Telephone Encounter (Signed)
-----   Message from Sherryl Barters, MD sent at 01/18/2015  7:56 AM EST ----- Regarding: RE: Particia Nearing. Could you please comment on any DDD on recent CT? Done, thanks.  There were no findings of degen disc disease.    ----- Message -----    From: Arnetha Courser, MD    Sent: 01/15/2015  12:24 PM      To: Sherryl Barters, MD Subject: Particia Nearing. Could you please comment on any D#  Greetings!  I hope you are well.  I appreciate you reading the CT scan for this patient December 25, 2014. She asked if there was any evidence of degenerative disc disease or anything wrong with her back. I wondered if you could comment on that. Thank you so much! Enid Derry, MD [facility]

## 2015-01-19 ENCOUNTER — Emergency Department
Admission: EM | Admit: 2015-01-19 | Discharge: 2015-01-19 | Disposition: A | Discharge status: Home / Self Care | Payer: BLUE CROSS/BLUE SHIELD | Attending: Emergency Medicine | Admitting: Emergency Medicine

## 2015-01-19 ENCOUNTER — Emergency Department: Payer: BLUE CROSS/BLUE SHIELD

## 2015-01-19 ENCOUNTER — Encounter: Payer: Self-pay | Admitting: Medical Oncology

## 2015-01-19 DIAGNOSIS — Z3202 Encounter for pregnancy test, result negative: Secondary | ICD-10-CM | POA: Insufficient documentation

## 2015-01-19 DIAGNOSIS — N3090 Cystitis, unspecified without hematuria: Secondary | ICD-10-CM

## 2015-01-19 DIAGNOSIS — R109 Unspecified abdominal pain: Secondary | ICD-10-CM | POA: Diagnosis present

## 2015-01-19 LAB — URINALYSIS COMPLETE WITH MICROSCOPIC (ARMC ONLY)
Bilirubin Urine: NEGATIVE
Glucose, UA: NEGATIVE mg/dL
KETONES UR: NEGATIVE mg/dL
NITRITE: NEGATIVE
PH: 5 (ref 5.0–8.0)
PROTEIN: 100 mg/dL — AB
SPECIFIC GRAVITY, URINE: 1.032 — AB (ref 1.005–1.030)

## 2015-01-19 LAB — BASIC METABOLIC PANEL
ANION GAP: 7 (ref 5–15)
BUN: 10 mg/dL (ref 6–20)
CALCIUM: 9.5 mg/dL (ref 8.9–10.3)
CO2: 27 mmol/L (ref 22–32)
CREATININE: 0.57 mg/dL (ref 0.44–1.00)
Chloride: 105 mmol/L (ref 101–111)
Glucose, Bld: 91 mg/dL (ref 65–99)
Potassium: 3.8 mmol/L (ref 3.5–5.1)
SODIUM: 139 mmol/L (ref 135–145)

## 2015-01-19 LAB — CBC
HCT: 40.6 % (ref 35.0–47.0)
Hemoglobin: 12.6 g/dL (ref 12.0–16.0)
MCH: 22.3 pg — ABNORMAL LOW (ref 26.0–34.0)
MCHC: 31 g/dL — AB (ref 32.0–36.0)
MCV: 71.9 fL — ABNORMAL LOW (ref 80.0–100.0)
PLATELETS: 429 10*3/uL (ref 150–440)
RBC: 5.65 MIL/uL — ABNORMAL HIGH (ref 3.80–5.20)
RDW: 17.6 % — AB (ref 11.5–14.5)
WBC: 8.5 10*3/uL (ref 3.6–11.0)

## 2015-01-19 LAB — POCT PREGNANCY, URINE: Preg Test, Ur: NEGATIVE

## 2015-01-19 MED ORDER — DEXTROSE 5 % IV SOLN
1.0000 g | Freq: Once | INTRAVENOUS | Status: AC
  Administered 2015-01-19: 1 g via INTRAVENOUS
  Filled 2015-01-19: qty 10

## 2015-01-19 MED ORDER — KETOROLAC TROMETHAMINE 30 MG/ML IJ SOLN
30.0000 mg | Freq: Once | INTRAMUSCULAR | Status: AC
  Administered 2015-01-19: 30 mg via INTRAVENOUS
  Filled 2015-01-19: qty 1

## 2015-01-19 MED ORDER — OXYCODONE-ACETAMINOPHEN 5-325 MG PO TABS: 2.0000 | ORAL_TABLET | Freq: Four times a day (QID) | ORAL | Status: DC | PRN

## 2015-01-19 MED ORDER — SULFAMETHOXAZOLE-TRIMETHOPRIM 800-160 MG PO TABS: 1.0000 | ORAL_TABLET | Freq: Two times a day (BID) | ORAL | Status: DC

## 2015-01-19 MED ORDER — SODIUM CHLORIDE 0.9 % IV SOLN
Freq: Once | INTRAVENOUS | Status: AC
  Administered 2015-01-19: 14:00:00 via INTRAVENOUS

## 2015-01-19 NOTE — ED Notes (Signed)
Patient transported to Ultrasound 

## 2015-01-19 NOTE — ED Notes (Signed)
Pt reports left sided flank pain for a couple of months, states that she has a hx of kidney stones and went to pcp and was told that she had kidney stones and to f/u with urology but unable to get appt until 3 weeks from now, here bc of pain and nausea.

## 2015-01-19 NOTE — Discharge Instructions (Signed)
Flank Pain °Flank pain refers to pain that is located on the side of the body between the upper abdomen and the back. The pain may occur over a short period of time (acute) or may be long-term or reoccurring (chronic). It may be mild or severe. Flank pain can be caused by many things. °CAUSES  °Some of the more common causes of flank pain include: °· Muscle strains.   °· Muscle spasms.   °· A disease of your spine (vertebral disk disease).   °· A lung infection (pneumonia).   °· Fluid around your lungs (pulmonary edema).   °· A kidney infection.   °· Kidney stones.   °· A very painful skin rash caused by the chickenpox virus (shingles).   °· Gallbladder disease.   °HOME CARE INSTRUCTIONS  °Home care will depend on the cause of your pain. In general, °· Rest as directed by your caregiver. °· Drink enough fluids to keep your urine clear or pale yellow. °· Only take over-the-counter or prescription medicines as directed by your caregiver. Some medicines may help relieve the pain. °· Tell your caregiver about any changes in your pain. °· Follow up with your caregiver as directed. °SEEK IMMEDIATE MEDICAL CARE IF:  °· Your pain is not controlled with medicine.   °· You have new or worsening symptoms. °· Your pain increases.   °· You have abdominal pain.   °· You have shortness of breath.   °· You have persistent nausea or vomiting.   °· You have swelling in your abdomen.   °· You feel faint or pass out.   °· You have blood in your urine. °· You have a fever or persistent symptoms for more than 2-3 days. °· You have a fever and your symptoms suddenly get worse. °MAKE SURE YOU:  °· Understand these instructions. °· Will watch your condition. °· Will get help right away if you are not doing well or get worse. °  °This information is not intended to replace advice given to you by your health care provider. Make sure you discuss any questions you have with your health care provider. °  °Document Released: 03/23/2005 Document  Revised: 10/25/2011 Document Reviewed: 09/14/2011 °Elsevier Interactive Patient Education ©2016 [employer] Inc. ° °Urinary Tract Infection °Urinary tract infections (UTIs) can develop anywhere along your urinary tract. Your urinary tract is your body's drainage system for removing wastes and extra water. Your urinary tract includes two kidneys, two ureters, a bladder, and a urethra. Your kidneys are a pair of bean-shaped organs. Each kidney is about the size of your fist. They are located below your ribs, one on each side of your spine. °CAUSES °Infections are caused by microbes, which are microscopic organisms, including fungi, viruses, and bacteria. These organisms are so small that they can only be seen through a microscope. Bacteria are the microbes that most commonly cause UTIs. °SYMPTOMS  °Symptoms of UTIs may vary by age and gender of the patient and by the location of the infection. Symptoms in young women typically include a frequent and intense urge to urinate and a painful, burning feeling in the bladder or urethra during urination. Older women and men are more likely to be tired, shaky, and weak and have muscle aches and abdominal pain. A fever may mean the infection is in your kidneys. Other symptoms of a kidney infection include pain in your back or sides below the ribs, nausea, and vomiting. °DIAGNOSIS °To diagnose a UTI, your caregiver will ask you about your symptoms. Your caregiver will also ask you to provide a urine sample. The   urine sample will be tested for bacteria and white blood cells. White blood cells are made by your body to help fight infection. °TREATMENT  °Typically, UTIs can be treated with medication. Because most UTIs are caused by a bacterial infection, they usually can be treated with the use of antibiotics. The choice of antibiotic and length of treatment depend on your symptoms and the type of bacteria causing your infection. °HOME CARE INSTRUCTIONS °· If you were prescribed  antibiotics, take them exactly as your caregiver instructs you. Finish the medication even if you feel better after you have only taken some of the medication. °· Drink enough water and fluids to keep your urine clear or pale yellow. °· Avoid caffeine, tea, and carbonated beverages. They tend to irritate your bladder. °· Empty your bladder often. Avoid holding urine for long periods of time. °· Empty your bladder before and after sexual intercourse. °· After a bowel movement, women should cleanse from front to back. Use each tissue only once. °SEEK MEDICAL CARE IF:  °· You have back pain. °· You develop a fever. °· Your symptoms do not begin to resolve within 3 days. °SEEK IMMEDIATE MEDICAL CARE IF:  °· You have severe back pain or lower abdominal pain. °· You develop chills. °· You have nausea or vomiting. °· You have continued burning or discomfort with urination. °MAKE SURE YOU:  °· Understand these instructions. °· Will watch your condition. °· Will get help right away if you are not doing well or get worse. °  °This information is not intended to replace advice given to you by your health care provider. Make sure you discuss any questions you have with your health care provider. °  °Document Released: 11/09/2004 Document Revised: 10/21/2014 Document Reviewed: 03/10/2011 °Elsevier Interactive Patient Education ©2016 Elsevier Inc. ° °

## 2015-01-19 NOTE — ED Provider Notes (Signed)
[facility] Emergency Department Provider Note     Time seen: ----------------------------------------- 1:24 PM on 01/19/2015 -----------------------------------------    I have reviewed the triage vital signs and the nursing notes.   HISTORY  Chief Complaint Flank Pain    HPI Diana Sexton is a 29 y.o. female who presents ER for left flank pain for several months.Patient states she has history of kidney stones when her primary care doctor was told she had had them and to follow-up with urology but she's been unable to get an appointment for 3 weeks. Patient is here because of pain and nausea. Patient states she's also had back pain intermittently for months, pain is only on the left side right now.  Past Medical History  Diagnosis Date  . Obesity   . Dysmenorrhea   . DVT (deep venous thrombosis) ([place]) 2013  . Cellulitis   . History of prothrombin mutation     Patient Active Problem List   Diagnosis Date Noted  . Nephrolithiasis 01/15/2015  . Abnormal radiologic findings on diagnostic imaging of renal pelvis, ureter, or bladder 01/15/2015  . Low HDL (under 40) 01/01/2015  . Elevated LDL cholesterol level 01/01/2015  . Insomnia 12/16/2014  . Morbid obesity ([place]) 12/11/2014  . Hematuria 12/11/2014  . Preventative health care 12/11/2014  . Low back pain 10/13/2014  . History of prothrombin mutation 10/13/2014    Past Surgical History  Procedure Laterality Date  . Cholecystectomy    . Tubal ligation    . Tonsillectomy and adenoidectomy    . Lithotripsy    . Dilitation & currettage/hystroscopy with essure    . Intrauterine device (iud) insertion      Allergies Review of patient's allergies indicates no known allergies.  Social History Social History  Substance Use Topics  . Smoking status: Never Smoker   . Smokeless tobacco: Never Used  . Alcohol Use: Yes     Comment: rare    Review of Systems Constitutional: Negative for  fever. Eyes: Negative for visual changes. ENT: Negative for sore throat. Cardiovascular: Negative for chest pain. Respiratory: Negative for shortness of breath. Gastrointestinal: Positive for flank pain and nausea Genitourinary: Negative for dysuria. Positive for hematuria Musculoskeletal: Positive for left-sided back pain Skin: Negative for rash. Neurological: Negative for headaches, focal weakness or numbness.  10-point ROS otherwise negative.  ____________________________________________   PHYSICAL EXAM:  VITAL SIGNS: ED Triage Vitals  Enc Vitals Group     BP 01/19/15 1231 147/78 mmHg     Pulse Rate 01/19/15 1231 75     Resp 01/19/15 1231 18     Temp 01/19/15 1231 97.6 F (36.4 C)     Temp Source 01/19/15 1231 Oral     SpO2 01/19/15 1231 98 %     Weight 01/19/15 1231 310 lb (140.615 kg)     Height 01/19/15 1231 5\' 5"  (1.651 m)     Head Cir --      Peak Flow --      Pain Score 01/19/15 1231 7     Pain Loc --      Pain Edu? --      Excl. in [place]? --     Constitutional: Alert and oriented. Well appearing and in no distress. Eyes: Conjunctivae are normal. PERRL. Normal extraocular movements. ENT   Head: Normocephalic and atraumatic.   Nose: No congestion/rhinnorhea.   Mouth/Throat: Mucous membranes are moist.   Neck: No stridor. Cardiovascular: Normal rate, regular rhythm. Normal and symmetric distal pulses are  present in all extremities. No murmurs, rubs, or gallops. Respiratory: Normal respiratory effort without tachypnea nor retractions. Breath sounds are clear and equal bilaterally. No wheezes/rales/rhonchi. Gastrointestinal: Left flank tenderness, left CVA tenderness, no rebound or guarding. Normal bowel sounds. Musculoskeletal: Nontender with normal range of motion in all extremities. No joint effusions.  No lower extremity tenderness nor edema. Neurologic:  Normal speech and language. No gross focal neurologic deficits are appreciated. Speech is  normal. No gait instability. Skin:  Skin is warm, dry and intact. No rash noted. Psychiatric: Mood and affect are normal. Speech and behavior are normal. Patient exhibits appropriate insight and judgment. ____________________________________________  ED COURSE:  Pertinent labs & imaging results that were available during my care of the patient were reviewed by me and considered in my medical decision making (see chart for details). Patient's no acute distress, possible renal colic or pyelonephritis. Patient receive IV Toradol, labs and likely renal ultrasound due to recent CT imaging ____________________________________________    LABS (pertinent positives/negatives)  Labs Reviewed  URINALYSIS COMPLETEWITH MICROSCOPIC ([facility] ONLY) - Abnormal; Notable for the following:    Color, Urine YELLOW (*)    APPearance CLOUDY (*)    Specific Gravity, Urine 1.032 (*)    Hgb urine dipstick 3+ (*)    Protein, ur 100 (*)    Leukocytes, UA 2+ (*)    Bacteria, UA RARE (*)    Squamous Epithelial / LPF 6-30 (*)    All other components within normal limits  CBC - Abnormal; Notable for the following:    RBC 5.65 (*)    MCV 71.9 (*)    MCH 22.3 (*)    MCHC 31.0 (*)    RDW 17.6 (*)    All other components within normal limits  URINE CULTURE  BASIC METABOLIC PANEL  POC URINE PREG, ED  POCT PREGNANCY, URINE    RADIOLOGY Images were viewed by me  Renal ultrasound IMPRESSION: Nonobstructing calculi in each kidney. No hydronephrosis on either side. Study otherwise unremarkable. Note the kidneys are borderline prominent in size bilaterally with normal contours and normal echogenicity bilaterally. ____________________________________________  FINAL ASSESSMENT AND PLAN  Flank pain, cystitis  Plan: Patient with labs and imaging as dictated above. Patient likely has ascending UTI or UTI plus musculoskeletal back pain. Her ultrasound here is grossly unremarkable. I have sent a urine culture, she  received IV Rocephin here and she'll be discharged with Septra and close follow-up with urology.   Earleen Newport, MD   Earleen Newport, MD 01/19/15 [phone]

## 2015-01-21 ENCOUNTER — Encounter: Payer: Self-pay | Admitting: Family Medicine

## 2015-01-21 ENCOUNTER — Ambulatory Visit (INDEPENDENT_AMBULATORY_CARE_PROVIDER_SITE_OTHER): Payer: BLUE CROSS/BLUE SHIELD | Admitting: Family Medicine

## 2015-01-21 VITALS — BP 128/84 | HR 70 | Temp 98.7°F | Ht 65.0 in | Wt 308.0 lb

## 2015-01-21 DIAGNOSIS — M545 Low back pain: Secondary | ICD-10-CM | POA: Diagnosis not present

## 2015-01-21 DIAGNOSIS — R319 Hematuria, unspecified: Secondary | ICD-10-CM

## 2015-01-21 DIAGNOSIS — G4700 Insomnia, unspecified: Secondary | ICD-10-CM | POA: Diagnosis not present

## 2015-01-21 DIAGNOSIS — F419 Anxiety disorder, unspecified: Secondary | ICD-10-CM

## 2015-01-21 DIAGNOSIS — N200 Calculus of kidney: Secondary | ICD-10-CM

## 2015-01-21 DIAGNOSIS — N3001 Acute cystitis with hematuria: Secondary | ICD-10-CM | POA: Diagnosis not present

## 2015-01-21 DIAGNOSIS — N390 Urinary tract infection, site not specified: Secondary | ICD-10-CM | POA: Insufficient documentation

## 2015-01-21 LAB — URINE CULTURE: SPECIAL REQUESTS: NORMAL

## 2015-01-21 MED ORDER — ESCITALOPRAM OXALATE 10 MG PO TABS: 10.0000 mg | ORAL_TABLET | Freq: Every day | ORAL | Status: DC

## 2015-01-21 NOTE — Assessment & Plan Note (Signed)
Add SSRI; warned about risk of hypomania or mania, reasons to call me right away before next appt; close f/u

## 2015-01-21 NOTE — Assessment & Plan Note (Addendum)
Encouraged her to keep appt with urologist and ask if some or all of her pain could be coming from her kidney stones; however, she does have palpable pain lower lumbar region in the midline; will refer to orthopaedist for evaluation; her morbid obesity puts her at risk for significant DDD; she is working on weight loss

## 2015-01-21 NOTE — Assessment & Plan Note (Addendum)
Encouragement given for her to work on weight loss; slow and steady wins the race, not rapid loss with gimmicks or pills; encouraged her to check out What It Takes to Lose Weight, see AVS

## 2015-01-21 NOTE — Assessment & Plan Note (Signed)
Urine from ER reviewed; she is on TMP/SMX DS; she will be seeing urologist soon; hydration encouraged

## 2015-01-21 NOTE — Assessment & Plan Note (Signed)
Encouraged patient to follow-up with urologist; hydration encouraged; add lemon to water; avoid tea

## 2015-01-21 NOTE — Patient Instructions (Addendum)
Try to drink plenty of water, and add lemon to the water Cut out iced tea  Check out the information at familydoctor.org entitled "What It Takes to Lose Weight" Try to lose between 1-2 pounds per week by taking in fewer calories and burning off more calories You can succeed by limiting portions, limiting foods dense in calories and fat, becoming more active, and drinking 8 glasses of water a day (64 ounces) Don't skip meals, especially breakfast, as skipping meals may alter your metabolism Do not use over-the-counter weight loss pills or gimmicks that claim rapid weight loss A healthy BMI (or body mass index) is between 18.5 and 24.9 You can calculate your ideal BMI at the [place] website [employer]fr  Continue the trazodone Start in the morning taking escitalopram for anxiety Call me with any problems

## 2015-01-21 NOTE — Progress Notes (Signed)
BP 128/84 mmHg  Pulse 70  Temp(Src) 98.7 F (37.1 C)  Ht 5\' 5"  (1.651 m)  Wt 308 lb (139.708 kg)  BMI 51.25 kg/m2  SpO2 100%   Subjective:    Patient ID: Diana Sexton, female    DOB: 1985/06/14, 29 y.o.   MRN: [id number]  HPI: Diana Sexton is a 29 y.o. female  Chief Complaint  Patient presents with  . Follow-up  . Cystitis    Was in hospital 2 days ago   She was having sharp pains in her back Having cramping in a sense, then felt she had kidney stones She called the urologist place and they told her to just go right on to the ER She didn't have kidney stone pain, but the pain was from infection of the walls of the bladder Some pain in the muscles on the back but from the infection per ER doctor Taking antibiotic, TMP/SMX Pain level today is a 3-4 out of 10; she is so used to the back pain, always there She has not seen the urologist yet, but will be getting in soon hopefully, 2-4 weeks She knows to drink a lot of water No fevers Urinalysis from ER reviewed [place] from ER viewed reviewed, impression copied and pasted below: IMPRESSION: Nonobstructing calculi in each kidney. No hydronephrosis on either side. Study otherwise unremarkable. Note the kidneys are borderline prominent in size bilaterally with normal contours and normal echogenicity bilaterally.   She is watching what she eats now; she managed to lose 50 pounds before, can't do dramatic at first, starts low and builds up for success  She went to the OB yesterday and they did a pelvic US; brought tears to her eyes when they were doing it; cyst not getting any bigger, going to keep watching as long as it doesn't get any bigger, not to the size where it is likely to twist  She continues to have back pain; she was told she broke her tailbone during her son's delivery; she never felt it; when she had her daughter, they did tests and told her her tailbone had been broken with her son's delivery  She can tell she  doesn't wake up as much; anxiety not really any better  Relevant past medical, surgical, family and social history reviewed and updated as indicated. Interim medical history since our last visit reviewed. Allergies and medications reviewed and updated.  Review of Systems Per HPI unless specifically indicated above     Objective:    BP 128/84 mmHg  Pulse 70  Temp(Src) 98.7 F (37.1 C)  Ht 5\' 5"  (1.651 m)  Wt 308 lb (139.708 kg)  BMI 51.25 kg/m2  SpO2 100%  Wt Readings from Last 3 Encounters:  01/21/15 308 lb (139.708 kg)  01/19/15 310 lb (140.615 kg)  12/11/14 306 lb 12.8 oz (139.164 kg)    Physical Exam  Constitutional: She appears well-developed and well-nourished. No distress.  Morbidly obese  HENT:  Mouth/Throat: Mucous membranes are normal.  Eyes: No scleral icterus.  Neck:  Thick and obese  Cardiovascular: Normal rate and regular rhythm.   Pulmonary/Chest: Effort normal and breath sounds normal.  Abdominal: Soft. She exhibits no distension. There is tenderness (minimal suprapubic tenderness and just left of midline).  Musculoskeletal:       Lumbar back: She exhibits decreased range of motion and tenderness.  Neurological: She displays no tremor. Gait normal.  Skin: Skin is warm. She is not diaphoretic. No pallor.  Psychiatric: She has  a normal mood and affect.   Results for orders placed or performed during the hospital encounter of 01/19/15  Urine culture  Result Value Ref Range   Specimen Description URINE, RANDOM    Special Requests Normal    Culture MULTIPLE SPECIES PRESENT, SUGGEST RECOLLECTION    Report Status 01/21/2015 FINAL   Urinalysis complete, with microscopic- may I&O cath if menses [facility] only)  Result Value Ref Range   Color, Urine YELLOW (A) YELLOW   APPearance CLOUDY (A) CLEAR   Glucose, UA NEGATIVE NEGATIVE mg/dL   Bilirubin Urine NEGATIVE NEGATIVE   Ketones, ur NEGATIVE NEGATIVE mg/dL   Specific Gravity, Urine 1.032 (H) 1.005 - 1.030    Hgb urine dipstick 3+ (A) NEGATIVE   pH 5.0 5.0 - 8.0   Protein, ur 100 (A) NEGATIVE mg/dL   Nitrite NEGATIVE NEGATIVE   Leukocytes, UA 2+ (A) NEGATIVE   RBC / HPF TOO NUMEROUS TO COUNT 0 - 5 RBC/hpf   WBC, UA TOO NUMEROUS TO COUNT 0 - 5 WBC/hpf   Bacteria, UA RARE (A) NONE SEEN   Squamous Epithelial / LPF 6-30 (A) NONE SEEN   Mucous PRESENT   CBC  Result Value Ref Range   WBC 8.5 3.6 - 11.0 K/uL   RBC 5.65 (H) 3.80 - 5.20 MIL/uL   Hemoglobin 12.6 12.0 - 16.0 g/dL   HCT 40.6 35.0 - 47.0 %   MCV 71.9 (L) 80.0 - 100.0 fL   MCH 22.3 (L) 26.0 - 34.0 pg   MCHC 31.0 (L) 32.0 - 36.0 g/dL   RDW 17.6 (H) 11.5 - 14.5 %   Platelets 429 150 - 440 K/uL  Basic metabolic panel  Result Value Ref Range   Sodium 139 135 - 145 mmol/L   Potassium 3.8 3.5 - 5.1 mmol/L   Chloride 105 101 - 111 mmol/L   CO2 27 22 - 32 mmol/L   Glucose, Bld 91 65 - 99 mg/dL   BUN 10 6 - 20 mg/dL   Creatinine, Ser 0.57 0.44 - 1.00 mg/dL   Calcium 9.5 8.9 - 10.3 mg/dL   GFR calc non Af Amer >60 >60 mL/min   GFR calc Af Amer >60 >60 mL/min   Anion gap 7 5 - 15  Pregnancy, urine POC  Result Value Ref Range   Preg Test, Ur NEGATIVE NEGATIVE      Assessment & Plan:   Problem List Items Addressed This Visit      Genitourinary   Nephrolithiasis    Encouraged patient to follow-up with urologist; hydration encouraged; add lemon to water; avoid tea      Urinary tract infection    Urine from ER reviewed; she is on TMP/SMX DS; she will be seeing urologist soon; hydration encouraged        Other   Low back pain - Primary    Encouraged her to keep appt with urologist and ask if some or all of her pain could be coming from her kidney stones; however, she does have palpable pain lower lumbar region in the midline; will refer to orthopaedist for evaluation; her morbid obesity puts her at risk for significant DDD; she is working on weight loss      Relevant Orders   Ambulatory referral to Orthopedic Surgery   Morbid  obesity ([place])    Encouragement given for her to work on weight loss; slow and steady wins the race, not rapid loss with gimmicks or pills; encouraged her to check out What It Takes  to Lose Weight, see AVS      Hematuria    With kidney stones; will have her keep f/u with urologist      Insomnia    Improved with trazodone, continue same      Anxiety    Add SSRI; warned about risk of hypomania or mania, reasons to call me right away before next appt; close f/u      Relevant Medications   escitalopram (LEXAPRO) 10 MG tablet      Follow up plan: Return in about 4 weeks (around 02/18/2015) for medicine check.  Meds ordered this encounter  Medications  . escitalopram (LEXAPRO) 10 MG tablet    Sig: Take 1 tablet (10 mg total) by mouth daily.    Dispense:  30 tablet    Refill:  1   An after-visit summary was printed and given to the patient at [place].  Please see the patient instructions which may contain other information and recommendations beyond what is mentioned above in the assessment and plan.

## 2015-01-21 NOTE — Assessment & Plan Note (Signed)
Improved with trazodone, continue same

## 2015-01-21 NOTE — Assessment & Plan Note (Signed)
With kidney stones; will have her keep f/u with urologist

## 2015-01-28 ENCOUNTER — Encounter: Payer: BLUE CROSS/BLUE SHIELD | Attending: Family Medicine | Admitting: Dietician

## 2015-01-28 ENCOUNTER — Encounter: Payer: Self-pay | Admitting: Dietician

## 2015-01-28 VITALS — Ht 65.0 in | Wt 307.7 lb

## 2015-01-28 DIAGNOSIS — E786 Lipoprotein deficiency: Secondary | ICD-10-CM | POA: Diagnosis not present

## 2015-01-28 DIAGNOSIS — E785 Hyperlipidemia, unspecified: Secondary | ICD-10-CM

## 2015-01-28 DIAGNOSIS — E7800 Pure hypercholesterolemia, unspecified: Secondary | ICD-10-CM | POA: Diagnosis not present

## 2015-01-28 NOTE — Progress Notes (Signed)
Medical Nutrition Therapy: Visit start time: 9:00am  end time: 10:00am Assessment:  Diagnosis: low HDL, elevated LDL Past medical history: obesity Psychosocial issues/ stress concerns: Pt. Rates her stress level as "high" and indicates "ok" as to how well she is dealing with her stress. Preferred learning method:  . Auditory . Hands-on Current weight: 307.7 lbs  Height: 65 in Medications, supplements: see list Progress and evaluation:  Patient in for initial nutrition assessment visit. She reports that she wants to lose weight. She states that she has gained approximately 50 lbs in 3 years since her 2nd child was born. She give a weight goal of 250 lbs. She states she is too busy to prepare and eat breakfast. She eats lunch, does not typically snack in the afternoon and reports she is very hungry by the time she has dinner ready. When asked, she states that her problem areas related to diet are a low intake of fruits/vegetables and portion control, especially at evening meal. She drinks 16 oz. Of soda/day which she states is a significant decrease from soda intake 1 month ago (48 oz+)She states that her husband is not interested in "healthy eating" making it more difficult to make diet changes for the family.  Physical activity: none  Dietary Intake:  Usual eating pattern includes 2 meals and 1-2 snacks per day. Dining out frequency: 2 meals per week.  Breakfast: skips Lunch: [place] or ham sandwich with mayonnaise Snack: occasionally eats an afternoon snack. Dinner: 6:30 or later- meat (venison or chicken), mac.n'cheese or rice or potatoes, soda or water or spaghetti or homemade soup Snack: 16 oz 2% milk and "Little Debbie's indiv. Cake) Beverages: water, milk and 16 oz. soda  Nutrition Care Education: Weight control: Instructed on a meal plan based on 1800 calories, including carbohydrate counting and the balance of carbohydrate, protein, fat  and non-starchy vegetables. Encouraged to use  meal plan to be mindful and to offer some structure and flexibility vs.a rigid list of "do's and don'ts" Discussed adding vegetables or fruits to dinner meal that would require very little preparation or time. Gave and reviewed sample menus. Hyperlipidemia: Discussed ways to limit animal fat and other sources of saturated and trans fat. Discussed role of diet in lowering LDL cholesterol and exercise in raising HDL cholesterol.  Nutritional Diagnosis:  [place]-3.3 Overweight/obesity As related to large portions especially at evening meal, high fat choices and lack of physical activity.  As evidenced by diet history .  Intervention:  Work to include a breakfast such as yogurt, cheese toast, boiled egg (cook the night before). Try to balance meals with protein, 2-4 servings carbohydrate and "free vegetables" Keep some frozen vegetables on hand to add to meat and starch at meals. Estimate carbohydrate servings at meals and snacks. Include 2-4 servings of carbohydrate at meals and 1 at snacks. Measure some portions of carbohydrate foods, especially starches. Read labels for carbohydrate grams remembering that 15 gms of carbohydrate equals one serving. Read labels for saturated and trans fat with goal of no more than 12 gms saturated fat/day and 0 gms trans fat  Education Materials given:  . Food lists/ Planning A Balanced Meal . Sample meal pattern/ menus . Goals/ instructions  Learner/ who was taught:  . Patient   Level of understanding: . Partial understanding; needs review/ practice Learning barriers: . None Willingness to learn/ readiness for change: . Eager, change in progress Monitoring and Evaluation:  Dietary intake, exercise,  and body weight  follow [id number] wants to wait to see if her insurance covers this visit before scheduling another appointment.     Gave her my name and number and encouraged her to call if has further questions or does want to schedule a follow-up       Appointment.

## 2015-01-28 NOTE — Patient Instructions (Signed)
Work to include a breakfast such as yogurt, cheese toast, boiled egg (cook the night before). Try to balance meals with protein, 2-4 servings carbohydrate and "free vegetables" Keep some frozen vegetables on hand to add to meat and starch at meals. Estimate carbohydrate servings at meals and snacks. Include 2-4 servings of carbohydrate at meals and 1 at snacks. Measure some portions of carbohydrate foods, especially starches. Read labels for carbohydrate grams remembering that 15 gms of carbohydrate equals one serving.

## 2015-02-18 ENCOUNTER — Ambulatory Visit: Payer: BLUE CROSS/BLUE SHIELD | Admitting: Family Medicine

## 2015-04-25 ENCOUNTER — Emergency Department
Admission: EM | Admit: 2015-04-25 | Discharge: 2015-04-25 | Disposition: A | Discharge status: Home / Self Care | Payer: BLUE CROSS/BLUE SHIELD | Attending: Emergency Medicine | Admitting: Emergency Medicine

## 2015-04-25 ENCOUNTER — Emergency Department: Payer: BLUE CROSS/BLUE SHIELD

## 2015-04-25 DIAGNOSIS — R1032 Left lower quadrant pain: Secondary | ICD-10-CM | POA: Insufficient documentation

## 2015-04-25 DIAGNOSIS — Z792 Long term (current) use of antibiotics: Secondary | ICD-10-CM | POA: Insufficient documentation

## 2015-04-25 DIAGNOSIS — Z79899 Other long term (current) drug therapy: Secondary | ICD-10-CM | POA: Diagnosis not present

## 2015-04-25 DIAGNOSIS — R112 Nausea with vomiting, unspecified: Secondary | ICD-10-CM

## 2015-04-25 DIAGNOSIS — Z3202 Encounter for pregnancy test, result negative: Secondary | ICD-10-CM | POA: Insufficient documentation

## 2015-04-25 DIAGNOSIS — R197 Diarrhea, unspecified: Secondary | ICD-10-CM | POA: Diagnosis not present

## 2015-04-25 LAB — CBC WITH DIFFERENTIAL/PLATELET
BASOS ABS: 0 10*3/uL (ref 0–0.1)
Basophils Relative: 0 %
EOS ABS: 0 10*3/uL (ref 0–0.7)
EOS PCT: 0 %
HCT: 43.1 % (ref 35.0–47.0)
Hemoglobin: 14.2 g/dL (ref 12.0–16.0)
LYMPHS ABS: 0.4 10*3/uL — AB (ref 1.0–3.6)
LYMPHS PCT: 3 %
MCH: 24 pg — AB (ref 26.0–34.0)
MCHC: 33 g/dL (ref 32.0–36.0)
MCV: 72.8 fL — AB (ref 80.0–100.0)
MONO ABS: 0.3 10*3/uL (ref 0.2–0.9)
Monocytes Relative: 2 %
Neutro Abs: 14.5 10*3/uL — ABNORMAL HIGH (ref 1.4–6.5)
Neutrophils Relative %: 95 %
PLATELETS: 395 10*3/uL (ref 150–440)
RBC: 5.92 MIL/uL — ABNORMAL HIGH (ref 3.80–5.20)
RDW: 17.8 % — AB (ref 11.5–14.5)
WBC: 15.3 10*3/uL — ABNORMAL HIGH (ref 3.6–11.0)

## 2015-04-25 LAB — URINALYSIS COMPLETE WITH MICROSCOPIC (ARMC ONLY)
BILIRUBIN URINE: NEGATIVE
Bacteria, UA: NONE SEEN
GLUCOSE, UA: NEGATIVE mg/dL
Ketones, ur: NEGATIVE mg/dL
LEUKOCYTES UA: NEGATIVE
NITRITE: NEGATIVE
Protein, ur: 100 mg/dL — AB
SPECIFIC GRAVITY, URINE: 1.024 (ref 1.005–1.030)
pH: 5 (ref 5.0–8.0)

## 2015-04-25 LAB — COMPREHENSIVE METABOLIC PANEL
ALT: 22 U/L (ref 14–54)
AST: 27 U/L (ref 15–41)
Albumin: 3.9 g/dL (ref 3.5–5.0)
Alkaline Phosphatase: 77 U/L (ref 38–126)
Anion gap: 9 (ref 5–15)
BUN: 10 mg/dL (ref 6–20)
CHLORIDE: 106 mmol/L (ref 101–111)
CO2: 23 mmol/L (ref 22–32)
Calcium: 9 mg/dL (ref 8.9–10.3)
Creatinine, Ser: 0.58 mg/dL (ref 0.44–1.00)
Glucose, Bld: 113 mg/dL — ABNORMAL HIGH (ref 65–99)
POTASSIUM: 3.7 mmol/L (ref 3.5–5.1)
Sodium: 138 mmol/L (ref 135–145)
Total Bilirubin: 0.7 mg/dL (ref 0.3–1.2)
Total Protein: 8.1 g/dL (ref 6.5–8.1)

## 2015-04-25 LAB — PREGNANCY, URINE: Preg Test, Ur: NEGATIVE

## 2015-04-25 LAB — LIPASE, BLOOD: LIPASE: 17 U/L (ref 11–51)

## 2015-04-25 MED ORDER — SODIUM CHLORIDE 0.9 % IV BOLUS (SEPSIS)
1000.0000 mL | Freq: Once | INTRAVENOUS | Status: AC
  Administered 2015-04-25: 1000 mL via INTRAVENOUS

## 2015-04-25 MED ORDER — IOHEXOL 240 MG/ML SOLN
25.0000 mL | Freq: Once | INTRAMUSCULAR | Status: AC | PRN
  Administered 2015-04-25: 25 mL via ORAL

## 2015-04-25 MED ORDER — ONDANSETRON HCL 4 MG/2ML IJ SOLN
4.0000 mg | Freq: Once | INTRAMUSCULAR | Status: AC
  Administered 2015-04-25: 4 mg via INTRAVENOUS
  Filled 2015-04-25: qty 2

## 2015-04-25 MED ORDER — IOHEXOL 300 MG/ML  SOLN
125.0000 mL | Freq: Once | INTRAMUSCULAR | Status: AC | PRN
  Administered 2015-04-25: 125 mL via INTRAVENOUS

## 2015-04-25 MED ORDER — MORPHINE SULFATE (PF) 4 MG/ML IV SOLN
4.0000 mg | Freq: Once | INTRAVENOUS | Status: AC
  Administered 2015-04-25: 4 mg via INTRAVENOUS
  Filled 2015-04-25: qty 1

## 2015-04-25 MED ORDER — ONDANSETRON HCL 4 MG PO TABS: 4.0000 mg | ORAL_TABLET | Freq: Every day | ORAL | Status: DC | PRN

## 2015-04-25 MED ORDER — ACETAMINOPHEN 325 MG PO TABS
650.0000 mg | ORAL_TABLET | Freq: Once | ORAL | Status: AC
  Administered 2015-04-25: 650 mg via ORAL
  Filled 2015-04-25: qty 2

## 2015-04-25 NOTE — ED Notes (Signed)
Patient transported to CT 

## 2015-04-25 NOTE — Discharge Instructions (Signed)
Abdominal Pain, Adult Many things can cause belly (abdominal) pain. Most times, the belly pain is not dangerous. Many cases of belly pain can be watched and treated at home. HOME CARE   Do not take medicines that help you go poop (laxatives) unless told to by your doctor.  Only take medicine as told by your doctor.  Eat or drink as told by your doctor. Your doctor will tell you if you should be on a special diet. GET HELP IF:  You do not know what is causing your belly pain.  You have belly pain while you are sick to your stomach (nauseous) or have runny poop (diarrhea).  You have pain while you pee or poop.  Your belly pain wakes you up at night.  You have belly pain that gets worse or better when you eat.  You have belly pain that gets worse when you eat fatty foods.  You have a fever. GET HELP RIGHT AWAY IF:   The pain does not go away within 2 hours.  You keep throwing up (vomiting).  The pain changes and is only in the right or left part of the belly.  You have bloody or tarry looking poop. MAKE SURE YOU:   Understand these instructions.  Will watch your condition.  Will get help right away if you are not doing well or get worse.   This information is not intended to replace advice given to you by your health care provider. Make sure you discuss any questions you have with your health care provider.   Document Released: 07/19/2007 Document Revised: 02/20/2014 Document Reviewed: 10/09/2012 Elsevier Interactive Patient Education 2016 [place].  Diarrhea Diarrhea is watery poop (stool). It can make you feel weak, tired, thirsty, or give you a dry mouth (signs of dehydration). Watery poop is a sign of another problem, most often an infection. It often lasts 2-3 days. It can last longer if it is a sign of something serious. Take care of yourself as told by your doctor. HOME CARE   Drink 1 cup (8 ounces) of fluid each time you have watery poop.  Do not drink  the following fluids:  Those that contain simple sugars (fructose, glucose, galactose, lactose, sucrose, maltose).  Sports drinks.  Fruit juices.  Whole milk products.  Sodas.  Drinks with caffeine (coffee, tea, soda) or alcohol.  Oral rehydration solution may be used if the doctor says it is okay. You may make your own solution. Follow this recipe:   - teaspoon table salt.   teaspoon baking soda.   teaspoon salt substitute containing potassium chloride.  1 tablespoons sugar.  1 liter (34 ounces) of water.  Avoid the following foods:  High fiber foods, such as raw fruits and vegetables.  Nuts, seeds, and whole grain breads and cereals.   Those that are sweetened with sugar alcohols (xylitol, sorbitol, mannitol).  Try eating the following foods:  Starchy foods, such as rice, toast, pasta, low-sugar cereal, oatmeal, baked potatoes, crackers, and bagels.  Bananas.  Applesauce.  Eat probiotic-rich foods, such as yogurt and milk products that are fermented.  Wash your hands well after each time you have watery poop.  Only take medicine as told by your doctor.  Take a warm bath to help lessen burning or pain from having watery poop. GET HELP RIGHT AWAY IF:   You cannot drink fluids without throwing up (vomiting).  You keep throwing up.  You have blood in your poop, or your poop looks  black and tarry.  You do not pee (urinate) in 6-8 hours, or there is only a small amount of very dark pee.  You have belly (abdominal) pain that gets worse or stays in the same spot (localizes).  You are weak, dizzy, confused, or light-headed.  You have a very bad headache.  Your watery poop gets worse or does not get better.  You have a fever or lasting symptoms for more than 2-3 days.  You have a fever and your symptoms suddenly get worse. MAKE SURE YOU:   Understand these instructions.  Will watch your condition.  Will get help right away if you are not doing well  or get worse.   This information is not intended to replace advice given to you by your health care provider. Make sure you discuss any questions you have with your health care provider.   Document Released: 07/19/2007 Document Revised: 02/20/2014 Document Reviewed: 10/08/2011 Elsevier Interactive Patient Education 2016 [employer]  Nausea and Vomiting Nausea means you feel sick to your stomach. Throwing up (vomiting) is a reflex where stomach contents come out of your mouth. HOME CARE   Take medicine as told by your doctor.  Do not force yourself to eat. However, you do need to drink fluids.  If you feel like eating, eat a normal diet as told by your doctor.  Eat rice, wheat, potatoes, bread, lean meats, yogurt, fruits, and vegetables.  Avoid high-fat foods.  Drink enough fluids to keep your pee (urine) clear or pale yellow.  Ask your doctor how to replace body fluid losses (rehydrate). Signs of body fluid loss (dehydration) include:  Feeling very thirsty.  Dry lips and mouth.  Feeling dizzy.  Dark pee.  Peeing less than normal.  Feeling confused.  Fast breathing or heart rate. GET HELP RIGHT AWAY IF:   You have blood in your throw up.  You have black or bloody poop (stool).  You have a bad headache or stiff neck.  You feel confused.  You have bad belly (abdominal) pain.  You have chest pain or trouble breathing.  You do not pee at least once every 8 hours.  You have cold, clammy skin.  You keep throwing up after 24 to 48 hours.  You have a fever. MAKE SURE YOU:   Understand these instructions.  Will watch your condition.  Will get help right away if you are not doing well or get worse.   This information is not intended to replace advice given to you by your health care provider. Make sure you discuss any questions you have with your health care provider.   Document Released: 07/19/2007 Document Revised: 04/24/2011 Document Reviewed:  07/01/2010 Elsevier Interactive Patient Education [employer].

## 2015-04-25 NOTE — ED Notes (Signed)
Pt verbalized understanding of discharge instructions. NAD at this time. 

## 2015-04-25 NOTE — ED Provider Notes (Signed)
[facility] Emergency Department Provider Note  ____________________________________________  Time seen: Approximately 220 PM  I have reviewed the triage vital signs and the nursing notes.   HISTORY  Chief Complaint Emesis and Diarrhea    HPI Adreanne Levar Depaolo is a 30 y.o. female with a history of obesity as well as UTI who is presenting to the emergency department today with left-sided flank pain and nausea and vomiting. Says the pain started about 3 or 4:00 in the morning. She has had about 1 episode of emesis per hour which is progressed to bilious emesis. Has also had one to 2 episodes of diarrhea per hour. No blood in the stool or the vomitus. Says the pain is a 7 out of 10 in the left lower quadrant and left back. She says it is actually radiating around from the left lower back. Denies any frequency or burning with urination. Says that she does have a history of kidney stones. Denies any recent antibiotics. No known sick contacts. However, she does work at a school.She describes the pain as cramping.   Past Medical History  Diagnosis Date  . Obesity   . Dysmenorrhea   . DVT (deep venous thrombosis) ([place]) 2013  . Cellulitis   . History of prothrombin mutation     Patient Active Problem List   Diagnosis Date Noted  . Urinary tract infection 01/21/2015  . Anxiety 01/21/2015  . Nephrolithiasis 01/15/2015  . Abnormal radiologic findings on diagnostic imaging of renal pelvis, ureter, or bladder 01/15/2015  . Low HDL (under 40) 01/01/2015  . Elevated LDL cholesterol level 01/01/2015  . Insomnia 12/16/2014  . Morbid obesity ([place]) 12/11/2014  . Hematuria 12/11/2014  . Preventative health care 12/11/2014  . Low back pain 10/13/2014  . History of prothrombin mutation 10/13/2014    Past Surgical History  Procedure Laterality Date  . Cholecystectomy    . Tubal ligation    . Tonsillectomy and adenoidectomy    . Lithotripsy    . Dilitation &  currettage/hystroscopy with essure    . Intrauterine device (iud) insertion      Current Outpatient Rx  Name  Route  Sig  Dispense  Refill  . escitalopram (LEXAPRO) 10 MG tablet   Oral   Take 1 tablet (10 mg total) by mouth daily.   30 tablet   1   . oxyCODONE-acetaminophen (PERCOCET) 5-325 MG tablet   Oral   Take 2 tablets by mouth every 6 (six) hours as needed for moderate pain or severe pain. Patient not taking: Reported on 01/28/2015   30 tablet   0   . sulfamethoxazole-trimethoprim (BACTRIM DS) 800-160 MG tablet   Oral   Take 1 tablet by mouth 2 (two) times daily.   20 tablet   0   . traZODone (DESYREL) 50 MG tablet   Oral   Take 1 tablet (50 mg total) by mouth at bedtime as needed for sleep.   30 tablet   3     Allergies Review of patient's allergies indicates no known allergies.  Family History  Problem Relation Age of Onset  . Cancer Mother     breast  . Diabetes Father   . Hypertension Father   . Heart disease Maternal Grandfather   . Dementia Paternal Grandmother     Social History Social History  Substance Use Topics  . Smoking status: Never Smoker   . Smokeless tobacco: Never Used  . Alcohol Use: Yes     Comment: rare  Review of Systems Constitutional: chills Eyes: No visual changes. ENT: No sore throat. Cardiovascular: Denies chest pain. Respiratory: Denies shortness of breath. Gastrointestinal:  No constipation. Genitourinary: Negative for dysuria. Musculoskeletal: Negative for back pain. Skin: Negative for rash. Neurological: Negative for headaches, focal weakness or numbness.  10-point ROS otherwise negative.  ____________________________________________   PHYSICAL EXAM:  VITAL SIGNS: ED Triage Vitals  Enc Vitals Group     BP 04/25/15 1259 160/83 mmHg     Pulse Rate 04/25/15 1259 130     Resp 04/25/15 1259 18     Temp 04/25/15 1259 99.4 F (37.4 C)     Temp Source 04/25/15 1409 Oral     SpO2 04/25/15 1259 99 %      Weight --      Height --      Head Cir --      Peak Flow --      Pain Score 04/25/15 1259 8     Pain Loc --      Pain Edu? --      Excl. in [place]? --     Constitutional: Alert and oriented. Well appearing and in no acute distress. Eyes: Conjunctivae are normal. PERRL. EOMI. Head: Atraumatic. Nose: No congestion/rhinnorhea. Mouth/Throat: Mucous membranes are moist.   Neck: No stridor.   Cardiovascular: Normal rate, regular rhythm. Grossly normal heart sounds.  Good peripheral circulation. Respiratory: Normal respiratory effort.  No retractions. Lungs CTAB. Gastrointestinal: Soft with left lower quadrant tenderness to palpation as well as left upper quadrant tenderness palpation. Tenderness is mild to moderate. No distention. left-sided CVA tenderness palpation.  Musculoskeletal: No lower extremity tenderness nor edema.  No joint effusions. Neurologic:  Normal speech and language. No gross focal neurologic deficits are appreciated. Skin:  Skin is warm, dry and intact. No rash noted. Psychiatric: Mood and affect are normal. Speech and behavior are normal.  ____________________________________________   LABS (all labs ordered are listed, but only abnormal results are displayed)  Labs Reviewed  CBC WITH DIFFERENTIAL/PLATELET - Abnormal; Notable for the following:    WBC 15.3 (*)    RBC 5.92 (*)    MCV 72.8 (*)    MCH 24.0 (*)    RDW 17.8 (*)    Neutro Abs 14.5 (*)    Lymphs Abs 0.4 (*)    All other components within normal limits  COMPREHENSIVE METABOLIC PANEL - Abnormal; Notable for the following:    Glucose, Bld 113 (*)    All other components within normal limits  URINALYSIS COMPLETEWITH MICROSCOPIC ([facility] ONLY) - Abnormal; Notable for the following:    Color, Urine YELLOW (*)    APPearance HAZY (*)    Hgb urine dipstick 3+ (*)    Protein, ur 100 (*)    Squamous Epithelial / LPF 0-5 (*)    All other components within normal limits  C DIFFICILE QUICK SCREEN W PCR REFLEX   LIPASE, BLOOD  PREGNANCY, URINE   ____________________________________________  EKG   ____________________________________________  RADIOLOGY  IMPRESSION: Bilateral nephrolithiasis. 8 mm left renal pelvic stone without hydronephrosis.  Mild diffuse fatty infiltration of the liver.   Electronically Signed By: Rolm Baptise M.D. On: 04/25/2015 15:42       ____________________________________________   PROCEDURES   ____________________________________________   INITIAL IMPRESSION / ASSESSMENT AND PLAN / ED COURSE  Pertinent labs & imaging results that were available during my care of the patient were reviewed by me and considered in my medical decision making (see chart for details).  -----------------------------------------  4:40 PM on 04/25/2015 -----------------------------------------  Patient is resting comfortably at this time. Is requesting something to eat. Has not had any further episodes of diarrhea. Nausea and vomiting is resolved after medication. Still mildly tachycardic. I suspect this is secondary to mild dehydration. We'll give an additional liter of fluids.  ----------------------------------------- 6:36 PM on 04/25/2015 -----------------------------------------  Patient's heart rate is now 100. Has not had any further nausea vomiting or diarrhea. Able tolerate by mouth fluids at this time. Will be discharged home. Likely viral illness. Likely left lower quadrant pain from the cramping from the amount of diarrhea she has been having. We'll discharge with Zofran as well as a work note as requested by the patient. ____________________________________________   FINAL CLINICAL IMPRESSION(S) / ED DIAGNOSES  Abdominal pain. Nausea vomiting and diarrhea.    Orbie Pyo, MD 04/25/15 [phone]

## 2015-04-25 NOTE — ED Notes (Signed)
Pt reports 12 hours of n/v/d. Pt also reports left flank pain.

## 2015-04-27 LAB — URINE CULTURE

## 2015-05-27 DIAGNOSIS — M4310 Spondylolisthesis, site unspecified: Secondary | ICD-10-CM | POA: Diagnosis not present

## 2015-10-10 DIAGNOSIS — M79672 Pain in left foot: Secondary | ICD-10-CM | POA: Diagnosis not present

## 2015-10-10 DIAGNOSIS — M79671 Pain in right foot: Secondary | ICD-10-CM | POA: Diagnosis not present

## 2015-11-09 ENCOUNTER — Ambulatory Visit (INDEPENDENT_AMBULATORY_CARE_PROVIDER_SITE_OTHER): Payer: BLUE CROSS/BLUE SHIELD | Admitting: Family Medicine

## 2015-11-09 ENCOUNTER — Encounter: Payer: Self-pay | Admitting: Family Medicine

## 2015-11-09 VITALS — BP 111/67 | HR 77 | Temp 98.6°F | Wt 318.0 lb

## 2015-11-09 DIAGNOSIS — H66001 Acute suppurative otitis media without spontaneous rupture of ear drum, right ear: Secondary | ICD-10-CM

## 2015-11-09 DIAGNOSIS — M545 Low back pain: Secondary | ICD-10-CM | POA: Diagnosis not present

## 2015-11-09 MED ORDER — AMOXICILLIN-POT CLAVULANATE 875-125 MG PO TABS: 1.0000 | ORAL_TABLET | Freq: Two times a day (BID) | ORAL | 0 refills | Status: DC

## 2015-11-09 MED ORDER — FLUTICASONE PROPIONATE 50 MCG/ACT NA SUSP: 2.0000 | Freq: Every day | NASAL | 6 refills | Status: DC

## 2015-11-09 MED ORDER — ALBUTEROL SULFATE HFA 108 (90 BASE) MCG/ACT IN AERS: 2.0000 | INHALATION_SPRAY | Freq: Four times a day (QID) | RESPIRATORY_TRACT | 0 refills | Status: DC | PRN

## 2015-11-09 MED ORDER — HYDROCOD POLST-CPM POLST ER 10-8 MG/5ML PO SUER: 5.0000 mL | Freq: Two times a day (BID) | ORAL | 0 refills | Status: DC | PRN

## 2015-11-09 NOTE — Assessment & Plan Note (Signed)
Discussed going back to see orthopedics since last visit was over a year ago. May require some imaging as symptoms have worsened. She will call for appt.

## 2015-11-09 NOTE — Patient Instructions (Signed)
Follow up as needed

## 2015-11-09 NOTE — Progress Notes (Signed)
BP 111/67   Pulse 77   Temp 98.6 F (37 C)   Wt (!) 318 lb (144.2 kg)   SpO2 97%   BMI 52.92 kg/m    Subjective:    Patient ID: Diana Sexton, female    DOB: June 02, 1985, 30 y.o.   MRN: [id number]  HPI: Diana Sexton is a 30 y.o. female  Chief Complaint  Patient presents with  . Cough    going on for 3 weeks, some head congestion, non productive cough, bilateral ear pain. No fever. Tried mucinex, robatussin, no relief.   Patient presents with 3 weeks of cough, sinus congestion, b/l ear pain (worse on right). Symptoms are worsening and patient is not able to sleep through the night from coughing so much. Also, has hx of back issues and the constant coughing is severely bothersome to her back and she is having trouble managing daily life with it. Denies fever, chills, N/V/D, CP, but has been having some wheezing and SOB during coughing fits. Taking mucinex with no relief.   Relevant past medical, surgical, family and social history reviewed and updated as indicated. Interim medical history since our last visit reviewed. Allergies and medications reviewed and updated.  Review of Systems  Constitutional: Negative.   HENT: Positive for congestion, ear pain, hearing loss and sinus pressure.   Eyes: Negative.   Respiratory: Positive for cough, shortness of breath and wheezing.   Cardiovascular: Negative.   Gastrointestinal: Negative.   Genitourinary: Negative.   Musculoskeletal: Negative.   Skin: Negative.   Neurological: Negative.   Psychiatric/Behavioral: Negative.     Per HPI unless specifically indicated above     Objective:    BP 111/67   Pulse 77   Temp 98.6 F (37 C)   Wt (!) 318 lb (144.2 kg)   SpO2 97%   BMI 52.92 kg/m   Wt Readings from Last 3 Encounters:  11/09/15 (!) 318 lb (144.2 kg)  01/28/15 (!) 307 lb 11.2 oz (139.6 kg)  01/21/15 (!) 308 lb (139.7 kg)    Physical Exam  Constitutional: She is oriented to person, place, and time. She appears  well-developed and well-nourished. No distress.  HENT:  Head: Atraumatic.  Left Ear: External ear normal.  Nose: Nose normal.  Mouth/Throat: Oropharynx is clear and moist. No oropharyngeal exudate.  Left TM with clear effusion, Right TM bulging and erythematous with purulent drainage present behind membrane.   Eyes: Conjunctivae are normal. Pupils are equal, round, and reactive to light. No scleral icterus.  Neck: Normal range of motion. Neck supple.  Cardiovascular: Normal rate, regular rhythm and normal heart sounds.   Pulmonary/Chest: Effort normal. No respiratory distress. She has wheezes.  Musculoskeletal: Normal range of motion.  Lymphadenopathy:    She has no cervical adenopathy.  Neurological: She is alert and oriented to person, place, and time.  Skin: Skin is warm and dry.  Psychiatric: She has a normal mood and affect. Her behavior is normal.  Nursing note and vitals reviewed.     Assessment & Plan:   Problem List Items Addressed This Visit      Other   Low back pain    Discussed going back to see orthopedics since last visit was over a year ago. May require some imaging as symptoms have worsened. She will call for appt.       Other Visit Diagnoses    Acute suppurative otitis media of right ear without spontaneous rupture of tympanic membrane, recurrence not specified    -  Primary   URI now with acute otitis, augmentin sent, flonase, albuterol, and tussionex sent. Continue mucinex, stay well hydrated, use humidifier.    Relevant Medications   amoxicillin-clavulanate (AUGMENTIN) 875-125 MG tablet       Follow up plan: Return if symptoms worsen or fail to improve.

## 2015-11-10 ENCOUNTER — Telehealth: Payer: Self-pay

## 2015-11-10 ENCOUNTER — Other Ambulatory Visit: Payer: Self-pay | Admitting: Family Medicine

## 2015-11-10 MED ORDER — AZITHROMYCIN 250 MG PO TABS: ORAL_TABLET | ORAL | 0 refills | Status: DC

## 2015-11-10 NOTE — Telephone Encounter (Signed)
Patient called and started that the antibiotic was giving her a rash. Can you change it to something else?

## 2015-11-10 NOTE — Telephone Encounter (Signed)
Advised pt to d/c augmentin, take benadryl as directed on package until rash clears. Sent in azithromycin instead. If rash worsens or does not clear in a few days, she is to call back

## 2015-12-13 ENCOUNTER — Encounter: Payer: BLUE CROSS/BLUE SHIELD | Admitting: Family Medicine

## 2016-02-16 ENCOUNTER — Ambulatory Visit (INDEPENDENT_AMBULATORY_CARE_PROVIDER_SITE_OTHER): Payer: BLUE CROSS/BLUE SHIELD | Admitting: Family Medicine

## 2016-02-16 ENCOUNTER — Encounter: Payer: Self-pay | Admitting: Family Medicine

## 2016-02-16 ENCOUNTER — Other Ambulatory Visit: Payer: Self-pay

## 2016-02-16 VITALS — BP 132/86 | HR 91 | Temp 98.0°F | Wt 319.0 lb

## 2016-02-16 DIAGNOSIS — J0100 Acute maxillary sinusitis, unspecified: Secondary | ICD-10-CM | POA: Diagnosis not present

## 2016-02-16 MED ORDER — AZITHROMYCIN 250 MG PO TABS: ORAL_TABLET | ORAL | 0 refills | Status: DC

## 2016-02-16 NOTE — Progress Notes (Signed)
   BP 132/86   Pulse 91   Temp 98 F (36.7 C)   Wt (!) 319 lb (144.7 kg)   SpO2 99%   BMI 53.08 kg/m    Subjective:    Patient ID: Diana Sexton, female    DOB: 01-29-1986, 31 y.o.   MRN: [id number]  HPI: Diana Sexton is a 31 y.o. female  Chief Complaint  Patient presents with  . Sinusitis    nasal congestion, bilateral ear pain, ears clogged since before Christmas, taking Sudafed with no relief. No fever.    Patient presents with 2 week history of congestion, b/l ear pain, and sinus pain/pressure. Denies fever, chills, sore throat. Has been taking sudafed with no relief. No sick contacts.   Relevant past medical, surgical, family and social history reviewed and updated as indicated. Interim medical history since our last visit reviewed. Allergies and medications reviewed and updated.  Review of Systems  Constitutional: Negative.   HENT: Positive for congestion, ear pain, sinus pain and sinus pressure.   Respiratory: Negative.   Cardiovascular: Negative.   Gastrointestinal: Negative.   Genitourinary: Negative.   Musculoskeletal: Negative.   Neurological: Negative.   Psychiatric/Behavioral: Negative.     Per HPI unless specifically indicated above     Objective:    BP 132/86   Pulse 91   Temp 98 F (36.7 C)   Wt (!) 319 lb (144.7 kg)   SpO2 99%   BMI 53.08 kg/m   Wt Readings from Last 3 Encounters:  02/16/16 (!) 319 lb (144.7 kg)  11/09/15 (!) 318 lb (144.2 kg)  01/28/15 (!) 307 lb 11.2 oz (139.6 kg)    Physical Exam  Constitutional: She is oriented to person, place, and time. She appears well-developed and well-nourished. No distress.  HENT:  Head: Atraumatic.  Nose: Nose normal.  Mouth/Throat: No oropharyngeal exudate.  B/l TMs injected with moderate effusion Sinus TTP present  Eyes: Conjunctivae are normal. Pupils are equal, round, and reactive to light. No scleral icterus.  Neck: Normal range of motion. Neck supple.  Cardiovascular: Normal  rate and normal heart sounds.   Pulmonary/Chest: Effort normal and breath sounds normal. No respiratory distress.  Musculoskeletal: Normal range of motion.  Lymphadenopathy:    She has no cervical adenopathy.  Neurological: She is alert and oriented to person, place, and time.  Skin: Skin is warm and dry.  Psychiatric: She has a normal mood and affect. Her behavior is normal.  Nursing note and vitals reviewed.     Assessment & Plan:   Problem List Items Addressed This Visit    None    Visit Diagnoses    Acute maxillary sinusitis, recurrence not specified    -  Primary   Will treat with azithromycin. Continue flonase and supportive care. Follow up if no improvement.    Relevant Medications   azithromycin (ZITHROMAX) 250 MG tablet       Follow up plan: Return if symptoms worsen or fail to improve.

## 2016-02-16 NOTE — Patient Instructions (Signed)
Follow up as needed

## 2016-03-16 DIAGNOSIS — R1032 Left lower quadrant pain: Secondary | ICD-10-CM | POA: Diagnosis not present

## 2016-03-16 DIAGNOSIS — Z30431 Encounter for routine checking of intrauterine contraceptive device: Secondary | ICD-10-CM | POA: Diagnosis not present

## 2016-03-16 DIAGNOSIS — N83209 Unspecified ovarian cyst, unspecified side: Secondary | ICD-10-CM | POA: Diagnosis not present

## 2016-03-16 DIAGNOSIS — R102 Pelvic and perineal pain: Secondary | ICD-10-CM | POA: Diagnosis not present

## 2016-04-03 ENCOUNTER — Telehealth: Payer: Self-pay | Admitting: Family Medicine

## 2016-04-03 NOTE — Telephone Encounter (Signed)
Routing to provider  

## 2016-04-03 NOTE — Telephone Encounter (Signed)
Needs appt since new episode

## 2016-04-04 NOTE — Telephone Encounter (Signed)
Patient notified, she states that she can not come in this week. I advised her to call us back if she needs [place].

## 2016-04-06 ENCOUNTER — Encounter: Payer: Self-pay | Admitting: Family Medicine

## 2016-04-06 ENCOUNTER — Ambulatory Visit (INDEPENDENT_AMBULATORY_CARE_PROVIDER_SITE_OTHER): Payer: BLUE CROSS/BLUE SHIELD | Admitting: Family Medicine

## 2016-04-06 VITALS — BP 113/81 | HR 95 | Temp 98.3°F | Resp 17 | Ht 65.0 in | Wt 315.0 lb

## 2016-04-06 DIAGNOSIS — H66006 Acute suppurative otitis media without spontaneous rupture of ear drum, recurrent, bilateral: Secondary | ICD-10-CM

## 2016-04-06 DIAGNOSIS — G4700 Insomnia, unspecified: Secondary | ICD-10-CM

## 2016-04-06 MED ORDER — TRAZODONE HCL 50 MG PO TABS: 25.0000 mg | ORAL_TABLET | Freq: Every evening | ORAL | 3 refills | Status: DC | PRN

## 2016-04-06 MED ORDER — SULFAMETHOXAZOLE-TRIMETHOPRIM 800-160 MG PO TABS: 1.0000 | ORAL_TABLET | Freq: Two times a day (BID) | ORAL | 0 refills | Status: DC

## 2016-04-06 NOTE — Progress Notes (Signed)
BP 113/81 (BP Location: Left Arm, Patient Position: Sitting, Cuff Size: Normal)   Pulse 95   Temp 98.3 F (36.8 C) (Oral)   Resp 17   Ht 5\' 5"  (1.651 m)   Wt (!) 315 lb (142.9 kg)   SpO2 96%   BMI 52.42 kg/m    Subjective:    Patient ID: Diana Sexton, female    DOB: 05/23/1985, 31 y.o.   MRN: [id number]  HPI: Diana Sexton is a 31 y.o. female  Chief Complaint  Patient presents with  . Ear Drainage    onset left 3-4 days/ mucinex, ran out 2 days ago  . Ear Pain    Rt onset 3-4 days  . Sore Throat   Patient presents with 3-4 days of sore throat, rhinorrhea, and severe ear pain that is worst on the right. Hearing has been muffled. Denies fever, chills, aches, cough, SOB, wheezing. Taking sudafed and mucinex with minimal relief. Still taking flonase prn. No sick contacts.   Also states she used to be on trazodone nightly for sleep, but took herself off several months ago because her sleep had improved. Recently noticing the sleep issues returning and wanting to go back on the medication. Finds it very hard to get a good night's sleep without waking up several times. Did very well on the trazodone, no side effects reported.   Past Medical History:  Diagnosis Date  . Cellulitis   . DVT (deep venous thrombosis) ([place]) 2013  . Dysmenorrhea   . History of prothrombin mutation   . Obesity    Social History   Social History  . Marital status: Married    Spouse name: N/A  . Number of children: N/A  . Years of education: N/A   Occupational History  . Not on file.   Social History Main Topics  . Smoking status: Never Smoker  . Smokeless tobacco: Never Used  . Alcohol use Yes     Comment: rare  . Drug use: No  . Sexual activity: Yes   Other Topics Concern  . Not on file   Social History Narrative  . No narrative on file    Relevant past medical, surgical, family and social history reviewed and updated as indicated. Interim medical history since our last visit  reviewed. Allergies and medications reviewed and updated.  Review of Systems  Constitutional: Negative.   HENT: Positive for ear pain, hearing loss (muffled), rhinorrhea and sore throat.   Respiratory: Negative.   Cardiovascular: Negative.   Gastrointestinal: Negative.   Genitourinary: Negative.   Musculoskeletal: Negative.   Neurological: Negative.   Psychiatric/Behavioral: Negative.     Per HPI unless specifically indicated above     Objective:    BP 113/81 (BP Location: Left Arm, Patient Position: Sitting, Cuff Size: Normal)   Pulse 95   Temp 98.3 F (36.8 C) (Oral)   Resp 17   Ht 5\' 5"  (1.651 m)   Wt (!) 315 lb (142.9 kg)   SpO2 96%   BMI 52.42 kg/m   Wt Readings from Last 3 Encounters:  04/06/16 (!) 315 lb (142.9 kg)  02/16/16 (!) 319 lb (144.7 kg)  11/09/15 (!) 318 lb (144.2 kg)    Physical Exam  Constitutional: She is oriented to person, place, and time. She appears well-developed and well-nourished. No distress.  HENT:  Head: Atraumatic.  Nasal mucosa injected Oropharynx erythematous B/l TMs edematous and erythematous with purulent fluid and blood present  Eyes: Conjunctivae and EOM are  normal. Pupils are equal, round, and reactive to light.  Neck: Normal range of motion. Neck supple.  Cardiovascular: Normal rate and normal heart sounds.   Pulmonary/Chest: Effort normal and breath sounds normal.  Musculoskeletal: Normal range of motion.  Lymphadenopathy:    She has cervical adenopathy.  Neurological: She is alert and oriented to person, place, and time.  Skin: Skin is warm and dry.  Psychiatric: She has a normal mood and affect. Her behavior is normal.  Nursing note and vitals reviewed.     Assessment & Plan:   Problem List Items Addressed This Visit      Other   Insomnia    Trazodone sent, discussed sleep hygiene. Follow up if no improvement.        Other Visit Diagnoses    Recurrent acute suppurative otitis media without spontaneous rupture  of tympanic membrane of both sides    -  Primary   Will treat with bactrim. Continue allergy regimen including sudafed for about a week or so. Follow up if no improvement.    Relevant Medications   sulfamethoxazole-trimethoprim (BACTRIM DS,SEPTRA DS) 800-160 MG tablet       Follow up plan: No Follow-up on file.

## 2016-04-10 ENCOUNTER — Encounter: Payer: Self-pay | Admitting: Family Medicine

## 2016-04-10 ENCOUNTER — Ambulatory Visit (INDEPENDENT_AMBULATORY_CARE_PROVIDER_SITE_OTHER): Payer: BLUE CROSS/BLUE SHIELD | Admitting: Family Medicine

## 2016-04-10 VITALS — BP 121/90 | HR 81 | Temp 98.2°F | Wt 311.4 lb

## 2016-04-10 DIAGNOSIS — J029 Acute pharyngitis, unspecified: Secondary | ICD-10-CM | POA: Diagnosis not present

## 2016-04-10 DIAGNOSIS — H66004 Acute suppurative otitis media without spontaneous rupture of ear drum, recurrent, right ear: Secondary | ICD-10-CM | POA: Diagnosis not present

## 2016-04-10 DIAGNOSIS — R5383 Other fatigue: Secondary | ICD-10-CM | POA: Diagnosis not present

## 2016-04-10 LAB — VERITOR FLU A/B WAIVED
INFLUENZA B: NEGATIVE
Influenza A: NEGATIVE

## 2016-04-10 MED ORDER — PREDNISONE 20 MG PO TABS: 40.0000 mg | ORAL_TABLET | Freq: Every day | ORAL | 0 refills | Status: DC

## 2016-04-10 MED ORDER — AZITHROMYCIN 250 MG PO TABS: ORAL_TABLET | ORAL | 0 refills | Status: DC

## 2016-04-10 NOTE — Patient Instructions (Signed)
Follow up as needed

## 2016-04-10 NOTE — Assessment & Plan Note (Signed)
Trazodone sent, discussed sleep hygiene. Follow up if no improvement.

## 2016-04-10 NOTE — Progress Notes (Signed)
BP 121/90 (BP Location: Left Arm, Patient Position: Sitting, Cuff Size: Normal)   Pulse 81   Temp 98.2 F (36.8 C)   Wt (!) 311 lb 6.4 oz (141.3 kg)   SpO2 99%   BMI 51.82 kg/m    Subjective:    Patient ID: Diana Sexton, female    DOB: 1985-12-10, 31 y.o.   MRN: [id number]  HPI: [name] is a 31 y.o. female  Chief Complaint  Patient presents with  . Sore Throat    Seen 04/06/16, Patient states she feels worse.    Sore, swollen throat with pain swallowing, continued ear pain, HA, fatigue. Denies fever, chills, body aches. Started bactrim after visit last week but no improvement since. Has not been taking OTC medications since starting the bactrim. Several sick contacts.   Past Medical History:  Diagnosis Date  . Cellulitis   . DVT (deep venous thrombosis) ([place]) 2013  . Dysmenorrhea   . History of prothrombin mutation   . Obesity    Social History   Social History  . Marital status: Married    Spouse name: N/A  . Number of children: N/A  . Years of education: N/A   Occupational History  . Not on file.   Social History Main Topics  . Smoking status: Never Smoker  . Smokeless tobacco: Never Used  . Alcohol use Yes     Comment: rare  . Drug use: No  . Sexual activity: Yes   Other Topics Concern  . Not on file   Social History Narrative  . No narrative on file    Relevant past medical, surgical, family and social history reviewed and updated as indicated. Interim medical history since our last visit reviewed. Allergies and medications reviewed and updated.  Review of Systems  Constitutional: Positive for fatigue.  HENT: Positive for congestion, ear pain and sore throat.   Eyes: Negative.   Respiratory: Negative.   Cardiovascular: Negative.   Gastrointestinal: Negative.   Genitourinary: Negative.   Musculoskeletal: Negative.   Neurological: Positive for headaches.  Psychiatric/Behavioral: Negative.     Per HPI unless specifically  indicated above     Objective:    BP 121/90 (BP Location: Left Arm, Patient Position: Sitting, Cuff Size: Normal)   Pulse 81   Temp 98.2 F (36.8 C)   Wt (!) 311 lb 6.4 oz (141.3 kg)   SpO2 99%   BMI 51.82 kg/m   Wt Readings from Last 3 Encounters:  04/10/16 (!) 311 lb 6.4 oz (141.3 kg)  04/06/16 (!) 315 lb (142.9 kg)  02/16/16 (!) 319 lb (144.7 kg)    Physical Exam  Constitutional: She is oriented to person, place, and time. She appears well-developed and well-nourished. No distress.  HENT:  Head: Atraumatic.  Left TM injected, Right TM edematous and erythematous with blood and purulent fluid  Oropharynx erythematous, uvula mildly erythematous and edematous  Eyes: Conjunctivae are normal. Pupils are equal, round, and reactive to light.  Neck: Normal range of motion. Neck supple.  Cardiovascular: Normal rate and normal heart sounds.   Pulmonary/Chest: Effort normal and breath sounds normal. No respiratory distress.  Musculoskeletal: Normal range of motion.  Lymphadenopathy:    She has no cervical adenopathy.  Neurological: She is alert and oriented to person, place, and time.  Skin: Skin is warm and dry.  Psychiatric: She has a normal mood and affect. Her behavior is normal.  Nursing note and vitals reviewed.     Assessment & Plan:  Problem List Items Addressed This Visit    None    Visit Diagnoses    Sore throat    -  Primary   Rapid strep negative, given worsening course and edema will do prednisone burst. Sudafed, throat sprays prn   Relevant Orders   Veritor Flu A/B Waived (Completed)   Rapid strep screen (not at [facility]) (Completed)   Recurrent acute suppurative otitis media of right ear without spontaneous rupture of tympanic membrane       Rapid flu neg. D/c bactrim given lack of response, start azithromycin. ENT referral placed given frequency of ear and sinus issues the past 6 months.    Relevant Medications   azithromycin (ZITHROMAX) 250 MG tablet   Other  Relevant Orders   Veritor Flu A/B Waived (Completed)   Rapid strep screen (not at [facility]) (Completed)   Ambulatory referral to ENT    Increase flonase to BID, and start sudafed around the clock the next week or so.    Follow up plan: Return if symptoms worsen or fail to improve.

## 2016-04-12 LAB — CULTURE, GROUP A STREP: Strep A Culture: NEGATIVE

## 2016-04-12 LAB — RAPID STREP SCREEN (MED CTR MEBANE ONLY): STREP GP A AG, IA W/REFLEX: NEGATIVE

## 2016-04-24 ENCOUNTER — Ambulatory Visit: Payer: BLUE CROSS/BLUE SHIELD | Admitting: Unknown Physician Specialty

## 2016-05-02 ENCOUNTER — Encounter: Payer: Self-pay | Admitting: Unknown Physician Specialty

## 2016-05-02 ENCOUNTER — Ambulatory Visit (INDEPENDENT_AMBULATORY_CARE_PROVIDER_SITE_OTHER): Payer: BLUE CROSS/BLUE SHIELD | Admitting: Unknown Physician Specialty

## 2016-05-02 ENCOUNTER — Ambulatory Visit
Admission: RE | Admit: 2016-05-02 | Discharge: 2016-05-02 | Disposition: A | Discharge status: Home / Self Care | Payer: BLUE CROSS/BLUE SHIELD | Source: Ambulatory Visit | Attending: Unknown Physician Specialty | Admitting: Unknown Physician Specialty

## 2016-05-02 VITALS — BP 122/73 | HR 80 | Temp 98.3°F | Wt 313.2 lb

## 2016-05-02 DIAGNOSIS — M545 Low back pain: Secondary | ICD-10-CM | POA: Insufficient documentation

## 2016-05-02 DIAGNOSIS — G8929 Other chronic pain: Secondary | ICD-10-CM

## 2016-05-02 DIAGNOSIS — K5900 Constipation, unspecified: Secondary | ICD-10-CM | POA: Insufficient documentation

## 2016-05-02 DIAGNOSIS — N200 Calculus of kidney: Secondary | ICD-10-CM | POA: Diagnosis not present

## 2016-05-02 DIAGNOSIS — K588 Other irritable bowel syndrome: Secondary | ICD-10-CM | POA: Diagnosis not present

## 2016-05-02 NOTE — Assessment & Plan Note (Addendum)
New problem.  Recommended food elimination and OTC [employer]

## 2016-05-02 NOTE — Assessment & Plan Note (Addendum)
Worsening over the past year.  Refer to PT.  Order anothre x-ray as I can't see the one from last year in Epic

## 2016-05-02 NOTE — Progress Notes (Signed)
BP 122/73 (BP Location: Left Arm, Patient Position: Sitting, Cuff Size: Large)   Pulse 80   Temp 98.3 F (36.8 C)   Wt (!) 313 lb 3.2 oz (142.1 kg)   SpO2 97%   BMI 52.12 kg/m    Subjective:    Patient ID: Diana Sexton, female    DOB: 1985-06-20, 31 y.o.   MRN: [id number]  HPI: Diana Sexton is a 31 y.o. female  Chief Complaint  Patient presents with  . Pain    pt states she has had low back pain for years and is now having cramps as well. States she was check out at [facility] and they stated that she may have IBS. Patient states that when she has the back pain and cramps together, it is crippling pain   Low back pain Pt complaining of low back pain for about a year.  Diagnosed at one point by Orthopedics who offered no suggestion other than weight loss surgeries.  Pain center of lower back but does not radiate.  Increased pain with prolonged sitting and standing.  Relieved with lying down and forward flexion.      IBS Intermittent left lower abdominal pain.  Relieved with defecation.  Pain is ongoing for 4-5 months.  2 BMs/day.  No rectal bleeding.  No urinary symptoms   Relevant past medical, surgical, family and social history reviewed and updated as indicated. Interim medical history since our last visit reviewed. Allergies and medications reviewed and updated.  Review of Systems  All other systems reviewed and are negative.   Per HPI unless specifically indicated above     Objective:    BP 122/73 (BP Location: Left Arm, Patient Position: Sitting, Cuff Size: Large)   Pulse 80   Temp 98.3 F (36.8 C)   Wt (!) 313 lb 3.2 oz (142.1 kg)   SpO2 97%   BMI 52.12 kg/m   Wt Readings from Last 3 Encounters:  05/02/16 (!) 313 lb 3.2 oz (142.1 kg)  04/10/16 (!) 311 lb 6.4 oz (141.3 kg)  04/06/16 (!) 315 lb (142.9 kg)    Physical Exam  Constitutional: She is oriented to person, place, and time. She appears well-developed and well-nourished. No distress.  HENT:  Head:  Normocephalic and atraumatic.  Eyes: Conjunctivae and lids are normal. Right eye exhibits no discharge. Left eye exhibits no discharge. No scleral icterus.  Neck: Normal range of motion. Neck supple. No JVD present. Carotid bruit is not present.  Cardiovascular: Normal rate, regular rhythm and normal heart sounds.   Pulmonary/Chest: Effort normal and breath sounds normal.  Abdominal: Soft. Normal appearance. There is no splenomegaly or hepatomegaly. There is no tenderness. There is no rebound and no guarding.  Musculoskeletal:       Lumbar back: She exhibits decreased range of motion. She exhibits no tenderness, no bony tenderness, no swelling, no edema and no pain.  Neurological: She is alert and oriented to person, place, and time.  Skin: Skin is warm, dry and intact. No rash noted. No pallor.  Psychiatric: She has a normal mood and affect. Her behavior is normal. Judgment and thought content normal.      Assessment & Plan:   Problem List Items Addressed This Visit      Unprioritized   Low back pain - Primary    Worsening over the past year.  Refer to PT.  Order anothre x-ray as I can't see the one from last year in Epic  Relevant Orders   UA/M w/rflx Culture, Routine   DG Lumbar Spine Complete   Ambulatory referral to Physical Therapy   Other irritable bowel syndrome    New problem.  Recommended food elimination and OTC Al[employer]      Relevant Orders   CBC with Differential/Platelet   Comprehensive metabolic panel   Celiac Ab tTG DGP TIgA       Follow up plan: Return in about 6 weeks (around 06/13/2016).

## 2016-05-02 NOTE — Patient Instructions (Signed)
Try align

## 2016-05-03 ENCOUNTER — Encounter: Payer: Self-pay | Admitting: Unknown Physician Specialty

## 2016-05-03 LAB — CBC WITH DIFFERENTIAL/PLATELET
BASOS: 0 %
Basophils Absolute: 0 10*3/uL (ref 0.0–0.2)
EOS (ABSOLUTE): 0.2 10*3/uL (ref 0.0–0.4)
Eos: 2 %
HEMOGLOBIN: 13.9 g/dL (ref 11.1–15.9)
Hematocrit: 41.7 % (ref 34.0–46.6)
IMMATURE GRANS (ABS): 0 10*3/uL (ref 0.0–0.1)
IMMATURE GRANULOCYTES: 0 %
LYMPHS: 21 %
Lymphocytes Absolute: 2.4 10*3/uL (ref 0.7–3.1)
MCH: 25.5 pg — ABNORMAL LOW (ref 26.6–33.0)
MCHC: 33.3 g/dL (ref 31.5–35.7)
MCV: 76 fL — ABNORMAL LOW (ref 79–97)
MONOCYTES: 5 %
Monocytes Absolute: 0.5 10*3/uL (ref 0.1–0.9)
NEUTROS ABS: 8.4 10*3/uL — AB (ref 1.4–7.0)
NEUTROS PCT: 72 %
PLATELETS: 389 10*3/uL — AB (ref 150–379)
RBC: 5.46 x10E6/uL — ABNORMAL HIGH (ref 3.77–5.28)
RDW: 16 % — ABNORMAL HIGH (ref 12.3–15.4)
WBC: 11.6 10*3/uL — ABNORMAL HIGH (ref 3.4–10.8)

## 2016-05-03 LAB — COMPREHENSIVE METABOLIC PANEL
A/G RATIO: 1.2 (ref 1.2–2.2)
ALBUMIN: 4.1 g/dL (ref 3.5–5.5)
ALT: 18 IU/L (ref 0–32)
AST: 19 IU/L (ref 0–40)
Alkaline Phosphatase: 84 IU/L (ref 39–117)
BUN / CREAT RATIO: 12 (ref 9–23)
BUN: 7 mg/dL (ref 6–20)
Bilirubin Total: 0.3 mg/dL (ref 0.0–1.2)
CALCIUM: 9.8 mg/dL (ref 8.7–10.2)
CO2: 24 mmol/L (ref 18–29)
Chloride: 100 mmol/L (ref 96–106)
Creatinine, Ser: 0.6 mg/dL (ref 0.57–1.00)
GFR calc Af Amer: 142 mL/min/{1.73_m2} (ref >59)
GFR, EST NON AFRICAN AMERICAN: 124 mL/min/{1.73_m2} (ref >59)
GLUCOSE: 85 mg/dL (ref 65–99)
Globulin, Total: 3.3 g/dL (ref 1.5–4.5)
Potassium: 4.6 mmol/L (ref 3.5–5.2)
Sodium: 141 mmol/L (ref 134–144)
TOTAL PROTEIN: 7.4 g/dL (ref 6.0–8.5)

## 2016-05-03 LAB — CELIAC AB TTG DGP TIGA
ANTIGLIADIN ABS, IGA: 10 U (ref 0–19)
GLIADIN IGG: 2 U (ref 0–19)
IGA/IMMUNOGLOBULIN A, SERUM: 372 mg/dL — AB (ref 87–352)

## 2016-05-04 LAB — UA/M W/RFLX CULTURE, ROUTINE
Bilirubin, UA: NEGATIVE
GLUCOSE, UA: NEGATIVE
Ketones, UA: NEGATIVE
Nitrite, UA: NEGATIVE
PH UA: 5.5 (ref 5.0–7.5)
SPEC GRAV UA: 1.025 (ref 1.005–1.030)
Urobilinogen, Ur: 0.2 mg/dL (ref 0.2–1.0)

## 2016-05-04 LAB — URINE CULTURE, REFLEX

## 2016-05-04 LAB — MICROSCOPIC EXAMINATION: Bacteria, UA: NONE SEEN

## 2016-05-24 ENCOUNTER — Ambulatory Visit: Payer: BLUE CROSS/BLUE SHIELD | Attending: Unknown Physician Specialty

## 2016-05-24 ENCOUNTER — Ambulatory Visit: Payer: BLUE CROSS/BLUE SHIELD | Admitting: Physical Therapy

## 2016-05-24 DIAGNOSIS — G8929 Other chronic pain: Secondary | ICD-10-CM | POA: Diagnosis not present

## 2016-05-24 DIAGNOSIS — M6281 Muscle weakness (generalized): Secondary | ICD-10-CM | POA: Insufficient documentation

## 2016-05-24 DIAGNOSIS — M545 Low back pain: Secondary | ICD-10-CM | POA: Insufficient documentation

## 2016-05-24 NOTE — Therapy (Signed)
[facility] [address] [place], [address] Phone: [phone]   Fax:  [phone]  Physical Therapy Evaluation  Patient Details  Name: Diana Sexton MRN: [id number] Date of Birth: [birth year] Referring Provider: Kathrine Haddock, NP  Encounter Date: 05/24/2016      PT End of Session - 05/24/16 1327    Visit Number 1   Number of Visits 13   Date for PT Re-Evaluation 07/05/16   PT Start Time 0850   PT Stop Time 0945   PT Time Calculation (min) 55 min   Activity Tolerance Patient tolerated treatment well   Behavior During Therapy [facility] for tasks assessed/performed      Past Medical History:  Diagnosis Date  . Cellulitis   . DVT (deep venous thrombosis) ([place]) 2013  . Dysmenorrhea   . History of prothrombin mutation   . Obesity     Past Surgical History:  Procedure Laterality Date  . CHOLECYSTECTOMY    . DILITATION & CURRETTAGE/HYSTROSCOPY WITH ESSURE    . INTRAUTERINE DEVICE (IUD) INSERTION    . LITHOTRIPSY    . TONSILLECTOMY AND ADENOIDECTOMY    . TUBAL LIGATION      There were no vitals filed for this visit.       Subjective Assessment - 05/24/16 0906    Subjective Low back pain   Pertinent History Pt complaining of low back pain for about 3-4 years and slowly worsening. She states that she fractured her tailbone when had her son 6 years ago but denies back pain at that time. She did not know about the tailbone fracture until she had her daughter and it was seen on imaging. She states that she had a lot of back pain in pregnancy with her daughter. No history of back trauma around the time of onset 3-4 years ago. She fell a few months ago at work and injured her ankle and right knee but no known back trauma at that time. She saw an orthopedist about 1 year ago about her back pain. He obtained radiographs and stated she had a stress fracture in her low back. Orthopedist gave her a couple stretches  and discussed weight loss surgery. She doesn't recall any specific diagnosis from orthopedist. She saw her PCP recently regarding the pain and had repeat radiographs. Radiographs showed no acute abnormalities and only multilevel degenerative changes. Pt reports that pain is midline low back pain from lower lumbar segments to sacrum. Pain does not radiate. Pain wakes her up at night. Stiffness is worse in the morning but pain is worse by the end of day.  Denies N/T. ROS negative for red flags.   Limitations Sitting   How long can you sit comfortably? 30 minutes   How long can you stand comfortably? Indefinitely   How long can you walk comfortably? 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 Yes   Pain Score 4   Best: 2/10   Pain Location Back   Pain Orientation Lower  Midline low back   Pain Descriptors / Indicators Aching   Pain Type Chronic pain   Pain Radiating Towards None   Pain Onset More than a month ago   Pain Frequency Constant   Aggravating Factors  Laying on stomach, prolonged sitting, prolonged walking, twisting, lifting   Pain Relieving Factors Laying  on back, heat, essential oils topical, forward flexion in standing,            [facility] PT Assessment - 05/24/16 1309      Assessment   Medical Diagnosis Low back pain   Referring Provider Kathrine Haddock, NP   Onset Date/Surgical Date 05/24/12  Approximate, recent exaccerbation   Hand Dominance Right   Next MD Visit None reported   Prior Therapy None     Precautions   Precautions None     Restrictions   Weight Bearing Restrictions No     Balance Screen   Has the patient fallen in the past 6 months Yes   How many times? 1   Has the patient had a decrease in activity level because of a fear of falling?  No   Is the patient reluctant to leave their home because of a fear of  falling?  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 Overweight with increased truncal adiposity. Stands with wide BOS.     ROM / Strength   AROM / PROM / Strength AROM;Strength     AROM   Overall AROM Comments Painful and limited lumbar flexion as well as extension from flexed position. Painless standing lumbar extension. Painful and limited lumbar lateral flexion bilaterally as well as lumbar rotation bilaterally. Poor lower lumbar segmental mobility noted in all directions. Hip PROM appears grossly WFL bilaterall and painless. Pt reports reproduction of pain with CPA to lumbar spine L1-L5, worse in lower segments. Pain with sacral thrust. FABER and FADIR negative. SLR and slump negative. Hamstring length is WFL. Knee to chest produces stretch but no pain. Hip scour negative     Strength   Overall Strength Comments Hip flexion, knee flex/ext, and ankle DF 4+/5. Hip IR/ER, is 4/5. Hip abduction and extension is 4-/5 and painless bilaterally.      Palpation   Palpation comment Pt is very tender to palpation along lumbar paraspinals bilaterally, worse in lower lumbar segments. Painful with very superficial palpation demonstrating hypersensitivity  to light touch     Ambulation/Gait   Gait Comments Full gait assessment deferred        TREATMENT  Ther-ex Single knee to chest 30s hold bilateral (HEP); Hooklying pelvic tilts x 10 each (HEP); Hooklying lumbar rotation rocking 10s hold x 5 each direction (HEP); Extensive education about HEP, please start to walk 2-3x/week at brisk enough pace for conversation to be challenging;                 PT Education - 05/24/16 1323    Education provided Yes   Education Details HEP, plan of care, nutritionist referral, importance of physical activity in chronic low back  pain   Person(s) Educated Patient   Methods Explanation   Comprehension Verbalized understanding             PT Long Term Goals - 05/25/16 0928      PT LONG TERM GOAL #1   Title Pt will be independent with HEP in order to improve strength and decrease low back pain in order to improve function at home, work, and with leisure activities such as spending time with her children   Time 6   Period Weeks   Status New     PT LONG TERM GOAL #2   Title Pt will decrease mODI scoreby at least 13% in order demonstrate clinically significant reduction in pain/disability    Baseline 05/25/16: 28%   Time 6   Status New     PT LONG TERM GOAL #3   Title Pt will improve hip abduction and extension by at least 1/2 MMT grade to 4/5 in order to improve lumbopelvic stability to decrease pain   Baseline 05/25/16: hip abd/ext 4-/5 bilateral   Time 6   Period Weeks   Status New     PT LONG TERM GOAL #4   Title Pt will be able to demonstrate proper lifting techniques in order to lift her children without increase in her back pain   Time 6   Period Weeks   Status New               Plan - 05/24/16 1327    Clinical Impression Statement Pt is a pleasant 31 yo female referred for low back pain. Pt reports low back pain for about 3-4 years and slowly worsening. Pain exacerbated recently. No known injury or trauma  to low back at onset. She reports history of low back stress fracture. Current radiographs show no acute abnormalities and only multilevel degenerative changes. Pt reports that pain is midline low back pain from lower lumbar segments to sacrum. Pain does not radiate. PT examination today reveals tenderness to light palpation over midline lumbar spine and along bilateral paraspinals. Pain is worse in lower lumbar segments. Pain with lumbar flexion, extension from flexed position, rotation, and lateral flexion. Poor lower lumbar mobility noted with hinge in higher lumbar segments. Neurologically intact. Negative SLR and slump. Weakness in hip abduction, adduction, and extension. Patient's history and examination suggest pain is possibly from degenerative changes in low back. Signs of pain sensitization present. Spent time discussing role of excessive loading from body weight on spine and effect on chronic pain. Discussed need for increased activity progressing into a gym-based exercise routine. Pt agreeable to meet with a nutritionist and states that family is supportive. Pt will benefit from skilled physical therapy to address deficits related to low back pain and improve function at home and work with less pain.    Rehab Potential Fair   Clinical Impairments Affecting Rehab Potential Positive: age, motivation; Negative: obesity, stressors, anxiety   PT Frequency 2x / week   PT Duration 6 weeks   PT Treatment/Interventions ADLs/Self Care Home Management;Aquatic Therapy;Cryotherapy;Electrical Stimulation;Iontophoresis 4mg /ml Dexamethasone;Moist Heat;Traction;Ultrasound;Gait training;Therapeutic activities;Therapeutic exercise;Neuromuscular re-education;Patient/family education;Manual techniques;Passive range of motion;Dry needling   PT Next Visit Plan grade I lumbar mobilizations, lumbar stretches, progress hip, abdominal, and low back strengthening within pt tolerable limits   PT Home Exercise Plan pelvic  tilts, single knee to chest, hooklying lumbar rotation rocking   Consulted and Agree with Plan of Care Patient      Patient will benefit from skilled therapeutic intervention in  order to improve the following deficits and impairments:  Obesity, Pain, Decreased strength, Hypomobility  Visit Diagnosis: Chronic midline low back pain without sciatica - Plan: PT plan of care cert/re-cert  Muscle weakness (generalized) - Plan: PT plan of care cert/re-cert     Problem List Patient Active Problem List   Diagnosis Date Noted  . Other irritable bowel syndrome 05/02/2016  . Urinary tract infection 01/21/2015  . Anxiety 01/21/2015  . Nephrolithiasis 01/15/2015  . Abnormal radiologic findings on diagnostic imaging of renal pelvis, ureter, or bladder 01/15/2015  . Low HDL (under 40) 01/01/2015  . Elevated LDL cholesterol level 01/01/2015  . Insomnia 12/16/2014  . Morbid obesity ([place]) 12/11/2014  . Hematuria 12/11/2014  . Preventative health care 12/11/2014  . Low back pain 10/13/2014  . History of prothrombin mutation 10/13/2014   Phillips Grout PT, DPT   Coran Dipaola 05/25/2016, 9:40 AM  [facility] [address], [place], [address] Phone: [phone]   Fax:  [phone]  Name: Diana Sexton MRN: [id number] Date of Birth: 1985-11-28

## 2016-05-29 ENCOUNTER — Ambulatory Visit: Payer: BLUE CROSS/BLUE SHIELD

## 2016-05-29 DIAGNOSIS — M545 Low back pain: Secondary | ICD-10-CM | POA: Diagnosis not present

## 2016-05-29 DIAGNOSIS — G8929 Other chronic pain: Secondary | ICD-10-CM | POA: Diagnosis not present

## 2016-05-29 DIAGNOSIS — M6281 Muscle weakness (generalized): Secondary | ICD-10-CM | POA: Diagnosis not present

## 2016-05-29 NOTE — Therapy (Signed)
[facility] [address], [place], [address] Phone: [phone]   Fax:  [phone]  Physical Therapy Treatment  Patient Details  Name: Diana Sexton MRN: [id number] Date of Birth: 1985-05-25 Referring Provider: Kathrine Haddock, NP  Encounter Date: 05/29/2016      PT End of Session - 05/29/16 0910    Visit Number 2   Number of Visits 13   Date for PT Re-Evaluation 07/05/16   PT Start Time 0910   PT Stop Time 1000   PT Time Calculation (min) 50 min   Activity Tolerance Patient tolerated treatment well   Behavior During Therapy [facility] for tasks assessed/performed      Past Medical History:  Diagnosis Date  . Cellulitis   . DVT (deep venous thrombosis) ([place]) 2013  . Dysmenorrhea   . History of prothrombin mutation   . Obesity     Past Surgical History:  Procedure Laterality Date  . CHOLECYSTECTOMY    . DILITATION & CURRETTAGE/HYSTROSCOPY WITH ESSURE    . INTRAUTERINE DEVICE (IUD) INSERTION    . LITHOTRIPSY    . TONSILLECTOMY AND ADENOIDECTOMY    . TUBAL LIGATION      There were no vitals filed for this visit.      Subjective Assessment - 05/29/16 0910    Subjective Pt reports 3/10 low back pain upon arrival. States that she notices less stiffness in the mornings since the initial evaluation with her stretches. She has been trying to incorporate some walking into her routine. No specific questions or concerns at this time.    Pertinent History Pt complaining of low back pain for about 3-4 years and slowly worsening. She states that she fractured her tailbone when had her son 6 years ago but denies back pain at that time. She did not know about the tailbone fracture until she had her daughter and it was seen on imaging. She states that she had a lot of back pain in pregnancy with her daughter. No history of back trauma around the time of onset 3-4 years ago. She fell a few months ago at work and  injured her ankle and right knee but no known back trauma at that time. She saw an orthopedist about 1 year ago about her back pain. He obtained radiographs and stated she had a stress fracture in her low back. Orthopedist gave her a couple stretches and discussed weight loss surgery. She doesn't recall any specific diagnosis from orthopedist. She saw her PCP recently regarding the pain and had repeat radiographs. Radiographs showed no acute abnormalities and only multilevel degenerative changes. Pt reports that pain is midline low back pain from lower lumbar segments to sacrum. Pain does not radiate. Pain wakes her up at night. Stiffness is worse in the morning but pain is worse by the end of day.  Denies N/T. ROS negative for red flags.   Limitations Sitting   How long can you sit comfortably? 30 minutes   How long can you stand comfortably? Indefinitely   How long can you walk comfortably? 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 Yes   Pain Score 3    Pain Location Back   Pain Orientation Lower   Pain Descriptors / Indicators Aching   Pain Type Chronic pain   Pain Radiating Towards None  Pain Onset More than a month ago   Pain Frequency Constant                TREATMENT  Manual Therapy STM to bilateral lumbar paraspinals with instrument assist, hypersensitive initially but improves with time; L2-L5 grade I PA mobilizations 30s/bout x 2 bouts each level, highly irritable initially but improves following STM between first and second bout; Single knee to chest stretch x 30s bilateral; Hip IR/ER stretches 30s hold bilateral;  Ther-ex Posterior pelvic tilts 5s hold x 10 each; Hooklying lumbar rotation rocking 10s hold x 10 each direction; Hooklying transverse abdominis contraction with alternating LE march 3s hold x 10 bilateral  (added to HEP); Hooklying transverse abdominis contraction with knee fallouts 3s hold x 10 bilateral (added to HEP); Extensive education with handout regarding HEP;   E-stim IFC to low back at pt tolerated intensity (11V) with moist heat pack x 10 minutes. No superficial skin irritation noted following, pt reports pain relief;              PT Education - 05/29/16 0910    Education provided Yes   Education Details HEP progressed   Person(s) Educated Patient   Methods Explanation   Comprehension Verbalized understanding             PT Long Term Goals - 05/25/16 0928      PT LONG TERM GOAL #1   Title Pt will be independent with HEP in order to improve strength and decrease low back pain in order to improve function at home, work, and with leisure activities such as spending time with her children   Time 6   Period Weeks   Status New     PT LONG TERM GOAL #2   Title Pt will decrease mODI scoreby at least 13% in order demonstrate clinically significant reduction in pain/disability    Baseline 05/25/16: 28%   Time 6   Status New     PT LONG TERM GOAL #3   Title Pt will improve hip abduction and extension by at least 1/2 MMT grade to 4/5 in order to improve lumbopelvic stability to decrease pain   Baseline 05/25/16: hip abd/ext 4-/5 bilateral   Time 6   Period Weeks   Status New     PT LONG TERM GOAL #4   Title Pt will be able to demonstrate proper lifting techniques in order to lift her children without increase in her back pain   Time 6   Period Weeks   Status New               Plan - 05/29/16 0910    Clinical Impression Statement Pt demonstrates progressive tolerance to STM and low grade lumbar mobilizations as session progresses. Pain remains highly irritable. Initiated low level abdominal strengthening program on this date and progressed HEP. Pt encouraged to continue stretches and follow-up as scheduled.    Rehab Potential Fair   Clinical Impairments  Affecting Rehab Potential Positive: age, motivation; Negative: obesity, stressors, anxiety   PT Frequency 2x / week   PT Duration 6 weeks   PT Treatment/Interventions ADLs/Self Care Home Management;Aquatic Therapy;Cryotherapy;Electrical Stimulation;Iontophoresis 4mg /ml Dexamethasone;Moist Heat;Traction;Ultrasound;Gait training;Therapeutic activities;Therapeutic exercise;Neuromuscular re-education;Patient/family education;Manual techniques;Passive range of motion;Dry needling   PT Next Visit Plan grade I lumbar mobilizations, lumbar stretches, progress hip, abdominal, and low back strengthening within pt tolerable limits   PT Home Exercise Plan pelvic tilts, single knee to chest, hooklying lumbar rotation rocking, TrA contraction with knee flops and  alternating LE lifts   Consulted and Agree with Plan of Care Patient      Patient will benefit from skilled therapeutic intervention in order to improve the following deficits and impairments:  Obesity, Pain, Decreased strength, Hypomobility  Visit Diagnosis: Chronic midline low back pain without sciatica  Muscle weakness (generalized)     Problem List Patient Active Problem List   Diagnosis Date Noted  . Other irritable bowel syndrome 05/02/2016  . Urinary tract infection 01/21/2015  . Anxiety 01/21/2015  . Nephrolithiasis 01/15/2015  . Abnormal radiologic findings on diagnostic imaging of renal pelvis, ureter, or bladder 01/15/2015  . Low HDL (under 40) 01/01/2015  . Elevated LDL cholesterol level 01/01/2015  . Insomnia 12/16/2014  . Morbid obesity ([place]) 12/11/2014  . Hematuria 12/11/2014  . Preventative health care 12/11/2014  . Low back pain 10/13/2014  . History of prothrombin mutation 10/13/2014   Phillips Grout PT, DPT   Huprich,Jason 05/29/2016, 10:47 AM  [facility] [address], [place], [address] Phone: [phone]   Fax:  [phone]  Name:  Diana Sexton MRN: [id number] Date of Birth: 04-08-1985

## 2016-05-31 ENCOUNTER — Ambulatory Visit: Payer: BLUE CROSS/BLUE SHIELD

## 2016-05-31 ENCOUNTER — Encounter: Payer: Self-pay | Admitting: Physical Therapy

## 2016-05-31 DIAGNOSIS — G8929 Other chronic pain: Secondary | ICD-10-CM | POA: Diagnosis not present

## 2016-05-31 DIAGNOSIS — M545 Low back pain, unspecified: Secondary | ICD-10-CM

## 2016-05-31 DIAGNOSIS — M6281 Muscle weakness (generalized): Secondary | ICD-10-CM

## 2016-05-31 NOTE — Therapy (Signed)
[facility] [address], [place], [address] Phone: [phone]   Fax:  [phone]  Physical Therapy Treatment  Patient Details  Name: Diana Sexton MRN: [id number] Date of Birth: 01-01-1986 Referring Provider: Kathrine Haddock, NP  Encounter Date: 05/31/2016      PT End of Session - 05/31/16 0949    Visit Number 3   Number of Visits 13   Date for PT Re-Evaluation 07/05/16   PT Start Time 0944   PT Stop Time 1030   PT Time Calculation (min) 46 min   Activity Tolerance Patient tolerated treatment well   Behavior During Therapy [facility] for tasks assessed/performed      Past Medical History:  Diagnosis Date  . Cellulitis   . DVT (deep venous thrombosis) ([place]) 2013  . Dysmenorrhea   . History of prothrombin mutation   . Obesity     Past Surgical History:  Procedure Laterality Date  . CHOLECYSTECTOMY    . DILITATION & CURRETTAGE/HYSTROSCOPY WITH ESSURE    . INTRAUTERINE DEVICE (IUD) INSERTION    . LITHOTRIPSY    . TONSILLECTOMY AND ADENOIDECTOMY    . TUBAL LIGATION      There were no vitals filed for this visit.      Subjective Assessment - 05/31/16 0943    Subjective Pt reports she is doing well today. She complains of 2/10 back pain upon arrival. Pt reports significant improvement in back pain following last therapy session. She states that for a period after the last therapy session she had no pain for the first time in 2 years. No specific questions or concerns at this time.    Pertinent History Pt complaining of low back pain for about 3-4 years and slowly worsening. She states that she fractured her tailbone when had her son 6 years ago but denies back pain at that time. She did not know about the tailbone fracture until she had her daughter and it was seen on imaging. She states that she had a lot of back pain in pregnancy with her daughter. No history of back trauma around the time of onset 3-4  years ago. She fell a few months ago at work and injured her ankle and right knee but no known back trauma at that time. She saw an orthopedist about 1 year ago about her back pain. He obtained radiographs and stated she had a stress fracture in her low back. Orthopedist gave her a couple stretches and discussed weight loss surgery. She doesn't recall any specific diagnosis from orthopedist. She saw her PCP recently regarding the pain and had repeat radiographs. Radiographs showed no acute abnormalities and only multilevel degenerative changes. Pt reports that pain is midline low back pain from lower lumbar segments to sacrum. Pain does not radiate. Pain wakes her up at night. Stiffness is worse in the morning but pain is worse by the end of day.  Denies N/T. ROS negative for red flags.   Limitations Sitting   How long can you sit comfortably? 30 minutes   How long can you stand comfortably? Indefinitely   How long can you walk comfortably? 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 Yes   Pain Score 2    Pain Location Back   Pain Orientation Lower   Pain Descriptors / Indicators Aching  Pain Type Chronic pain   Pain Onset More than a month ago         TREATMENT  Manual Therapy Moist heat back to lumbar spine x 3 minutes during history; STM to bilateral lumbar paraspinals with instrument assist, hypersensitive initially but improves with time; L2-L5 grade I PA mobilizations 30s/bout x 2 bouts each level, highly irritable initially but improves following STM between first and second bout; Single knee to chest stretch 30s x 2 bilateral; Hip IR/ER stretches 30s x 2 hold bilateral;  Ther-ex Posterior pelvic tilts 5s hold x 10 each; Hooklying lumbar rotation rocking 10s hold x 10 each direction; Hooklying transverse abdominis contraction with  alternating LE march 3s hold x 5 bilateral; Hooklying transverse abdominis contraction with knee fallouts 3s hold x 5 bilateral; Hooklying transverse abdominis contraction with heel slides 3s hold 5 bilateral; Hooklying transverse abdominis contraction with manual resistance at knees 5s hold x 5 each direction; Attempted modified dead bug and TrA contraction with double leg lift but pt unable to perform due to pain;    E-stim IFC to low back at pt tolerated intensity (12V) with moist heat pack x 10 minutes. No superficial skin irritation noted following, pt reports pain relief;                        PT Education - 05/31/16 0949    Education provided Yes   Education Details HEP reinforced   Person(s) Educated Patient   Methods Explanation   Comprehension Verbalized understanding             PT Long Term Goals - 05/25/16 0928      PT LONG TERM GOAL #1   Title Pt will be independent with HEP in order to improve strength and decrease low back pain in order to improve function at home, work, and with leisure activities such as spending time with her children   Time 6   Period Weeks   Status New     PT LONG TERM GOAL #2   Title Pt will decrease mODI scoreby at least 13% in order demonstrate clinically significant reduction in pain/disability    Baseline 05/25/16: 28%   Time 6   Status New     PT LONG TERM GOAL #3   Title Pt will improve hip abduction and extension by at least 1/2 MMT grade to 4/5 in order to improve lumbopelvic stability to decrease pain   Baseline 05/25/16: hip abd/ext 4-/5 bilateral   Time 6   Period Weeks   Status New     PT LONG TERM GOAL #4   Title Pt will be able to demonstrate proper lifting techniques in order to lift her children without increase in her back pain   Time 6   Period Weeks   Status New               Plan - 05/31/16 3335    Clinical Impression Statement Pt reports no pain at the end of session. Attempted  TrA contractions with double leg lifts but pt is unable to perform due to increase in back pain. Low back paraspinals are very sensitive to light palpation. Pt encouraged to continue HEP and follow-up as scheduled.    Rehab Potential Fair   Clinical Impairments Affecting Rehab Potential Positive: age, motivation; Negative: obesity, stressors, anxiety   PT Frequency 2x / week   PT Duration 6 weeks   PT Treatment/Interventions ADLs/Self Care Home Management;Aquatic Therapy;Cryotherapy;Dealer  Stimulation;Iontophoresis 4mg /ml Dexamethasone;Moist Heat;Traction;Ultrasound;Gait training;Therapeutic activities;Therapeutic exercise;Neuromuscular re-education;Patient/family education;Manual techniques;Passive range of motion;Dry needling   PT Next Visit Plan grade I lumbar mobilizations, lumbar stretches, progress hip, abdominal, and low back strengthening within pt tolerable limits   PT Home Exercise Plan pelvic tilts, single knee to chest, hooklying lumbar rotation rocking, TrA contraction with knee flops and alternating LE lifts   Consulted and Agree with Plan of Care Patient      Patient will benefit from skilled therapeutic intervention in order to improve the following deficits and impairments:  Obesity, Pain, Decreased strength, Hypomobility  Visit Diagnosis: Chronic midline low back pain without sciatica  Muscle weakness (generalized)     Problem List Patient Active Problem List   Diagnosis Date Noted  . Other irritable bowel syndrome 05/02/2016  . Urinary tract infection 01/21/2015  . Anxiety 01/21/2015  . Nephrolithiasis 01/15/2015  . Abnormal radiologic findings on diagnostic imaging of renal pelvis, ureter, or bladder 01/15/2015  . Low HDL (under 40) 01/01/2015  . Elevated LDL cholesterol level 01/01/2015  . Insomnia 12/16/2014  . Morbid obesity ([place]) 12/11/2014  . Hematuria 12/11/2014  . Preventative health care 12/11/2014  . Low back pain 10/13/2014  . History of  prothrombin mutation 10/13/2014   Phillips Grout PT, DPT   Holiday Mcmenamin 05/31/2016, 11:45 AM  [facility] [address], [place], [address] Phone: [phone]   Fax:  [phone]  Name: Diana Sexton MRN: [id number] Date of Birth: [birth year]

## 2016-06-07 ENCOUNTER — Ambulatory Visit: Payer: BLUE CROSS/BLUE SHIELD

## 2016-06-07 DIAGNOSIS — G8929 Other chronic pain: Secondary | ICD-10-CM | POA: Diagnosis not present

## 2016-06-07 DIAGNOSIS — M545 Low back pain, unspecified: Secondary | ICD-10-CM

## 2016-06-07 DIAGNOSIS — M6281 Muscle weakness (generalized): Secondary | ICD-10-CM

## 2016-06-07 NOTE — Therapy (Signed)
[facility] [address] [place], [address] Phone: [phone]   Fax:  [phone]  Physical Therapy Treatment  Patient Details  Name: Diana Sexton MRN: [id number] Date of Birth: 09/21/1985 Referring Provider: Kathrine Haddock, NP  Encounter Date: 06/07/2016      PT End of Session - 06/07/16 0808    Visit Number 4   Number of Visits 13   Date for PT Re-Evaluation 07/05/16   PT Start Time 0800   PT Stop Time 0850   PT Time Calculation (min) 50 min   Activity Tolerance Patient tolerated treatment well   Behavior During Therapy [facility] for tasks assessed/performed      Past Medical History:  Diagnosis Date  . Cellulitis   . DVT (deep venous thrombosis) ([place]) 2013  . Dysmenorrhea   . History of prothrombin mutation   . Obesity     Past Surgical History:  Procedure Laterality Date  . CHOLECYSTECTOMY    . DILITATION & CURRETTAGE/HYSTROSCOPY WITH ESSURE    . INTRAUTERINE DEVICE (IUD) INSERTION    . LITHOTRIPSY    . TONSILLECTOMY AND ADENOIDECTOMY    . TUBAL LIGATION      There were no vitals filed for this visit.      Subjective Assessment - 06/07/16 0804    Subjective Patient returns and reports increased low back pain today as a 3/10 pain. Patient reports decreased pain after the previous session lasting until yesterday.    Pertinent History Pt complaining of low back pain for about 3-4 years and slowly worsening. She states that she fractured her tailbone when had her son 6 years ago but denies back pain at that time. She did not know about the tailbone fracture until she had her daughter and it was seen on imaging. She states that she had a lot of back pain in pregnancy with her daughter. No history of back trauma around the time of onset 3-4 years ago. She fell a few months ago at work and injured her ankle and right knee but no known back trauma at that time. She saw an orthopedist about 1 year  ago about her back pain. He obtained radiographs and stated she had a stress fracture in her low back. Orthopedist gave her a couple stretches and discussed weight loss surgery. She doesn't recall any specific diagnosis from orthopedist. She saw her PCP recently regarding the pain and had repeat radiographs. Radiographs showed no acute abnormalities and only multilevel degenerative changes. Pt reports that pain is midline low back pain from lower lumbar segments to sacrum. Pain does not radiate. Pain wakes her up at night. Stiffness is worse in the morning but pain is worse by the end of day.  Denies N/T. ROS negative for red flags.   Limitations Sitting   How long can you sit comfortably? 30 minutes   How long can you stand comfortably? Indefinitely   How long can you walk comfortably? 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 Yes   Pain Score 3    Pain Location Back   Pain Orientation Lower   Pain Descriptors / Indicators Aching   Pain Type Chronic pain   Pain Onset More than a month ago   Pain Frequency Constant        TREATMENT   Manual Therapy  Moist heat back to lumbar spine x 3 minutes during history; STM to bilateral lumbar paraspinals hypersensitive initially but improves with time; L2-L5 grade II PA mobilizations 30s/bout x 2 bouts each level, highly irritable initially but improves following STM between first and second bout;    Ther-ex Posterior pelvic tilts 5s hold x 10 each; Hooklying knees to chest with red physioball - x 39min  Hooklying lumbar rotation rocking with feet on physioball - x 9min Prone hip extension - x 5 isometric contractions for 2 sec B Hooklying transverse abdominis contraction with alternating LE march 1s hold x 5 bilateral; Hooklying transverse abdominis contraction with manual resistance at knees 1s hold  x 5 each direction;    E-stim High Volt to low back at pt tolerated intensity with moist heat pack x 10 minutes. No superficial skin irritation noted following, pt reports pain relief with patient in prone. (18min unbilled)         PT Education - 06/07/16 0807    Education provided Yes   Education Details Form/technique throughout session   Person(s) Educated Patient   Methods Explanation;Demonstration   Comprehension Verbalized understanding;Returned demonstration             PT Long Term Goals - 05/25/16 0928      PT LONG TERM GOAL #1   Title Pt will be independent with HEP in order to improve strength and decrease low back pain in order to improve function at home, work, and with leisure activities such as spending time with her children   Time 6   Period Weeks   Status New     PT LONG TERM GOAL #2   Title Pt will decrease mODI scoreby at least 13% in order demonstrate clinically significant reduction in pain/disability    Baseline 05/25/16: 28%   Time 6   Status New     PT LONG TERM GOAL #3   Title Pt will improve hip abduction and extension by at least 1/2 MMT grade to 4/5 in order to improve lumbopelvic stability to decrease pain   Baseline 05/25/16: hip abd/ext 4-/5 bilateral   Time 6   Period Weeks   Status New     PT LONG TERM GOAL #4   Title Pt will be able to demonstrate proper lifting techniques in order to lift her children without increase in her back pain   Time 6   Period Weeks   Status New               Plan - 06/07/16 8250    Clinical Impression Statement Patient demonstrates improvement in tolerance to pressure with performing STM indicating improvement in spasms and neurological response to stimuli. Patient tolerates improvement in low back pain after performing manual therapy and prolonged positoining indicating improvement in coordination and AROM. Patient will benefit from further skilled therapy focused on improving muscular endurance  and coordination to return to prior level of function.    Rehab Potential Fair   Clinical Impairments Affecting Rehab Potential Positive: age, motivation; Negative: obesity, stressors, anxiety   PT Frequency 2x / week   PT Duration 6 weeks   PT Treatment/Interventions ADLs/Self Care Home Management;Aquatic Therapy;Cryotherapy;Electrical Stimulation;Iontophoresis 4mg /ml Dexamethasone;Moist Heat;Traction;Ultrasound;Gait training;Therapeutic activities;Therapeutic exercise;Neuromuscular re-education;Patient/family education;Manual techniques;Passive range of motion;Dry needling   PT Next Visit Plan grade I lumbar mobilizations, lumbar stretches, progress hip, abdominal, and low back strengthening within pt tolerable limits   PT Home Exercise Plan pelvic tilts, single knee to chest, hooklying lumbar rotation rocking, TrA  contraction with knee flops and alternating LE lifts   Consulted and Agree with Plan of Care Patient      Patient will benefit from skilled therapeutic intervention in order to improve the following deficits and impairments:  Obesity, Pain, Decreased strength, Hypomobility  Visit Diagnosis: Chronic midline low back pain without sciatica  Muscle weakness (generalized)     Problem List Patient Active Problem List   Diagnosis Date Noted  . Other irritable bowel syndrome 05/02/2016  . Urinary tract infection 01/21/2015  . Anxiety 01/21/2015  . Nephrolithiasis 01/15/2015  . Abnormal radiologic findings on diagnostic imaging of renal pelvis, ureter, or bladder 01/15/2015  . Low HDL (under 40) 01/01/2015  . Elevated LDL cholesterol level 01/01/2015  . Insomnia 12/16/2014  . Morbid obesity ([place]) 12/11/2014  . Hematuria 12/11/2014  . Preventative health care 12/11/2014  . Low back pain 10/13/2014  . History of prothrombin mutation 10/13/2014    Blythe Stanford, PT DPT 06/07/2016, 9:42 AM  [facility] [address] [place], [address] Phone: [phone]   Fax:  [phone]  Name: Diana Sexton MRN: [id number] Date of Birth: 10/01/1985

## 2016-06-12 ENCOUNTER — Ambulatory Visit: Payer: BLUE CROSS/BLUE SHIELD

## 2016-06-12 DIAGNOSIS — M545 Low back pain: Secondary | ICD-10-CM | POA: Diagnosis not present

## 2016-06-12 DIAGNOSIS — G8929 Other chronic pain: Secondary | ICD-10-CM

## 2016-06-12 DIAGNOSIS — M6281 Muscle weakness (generalized): Secondary | ICD-10-CM

## 2016-06-12 NOTE — Therapy (Signed)
[facility] [address] [place], [address] Phone: [phone]   Fax:  [phone]  Physical Therapy Treatment  Patient Details  Name: Diana Sexton MRN: [id number] Date of Birth: [birth year] Referring Provider: Kathrine Haddock, NP  Encounter Date: 06/12/2016      PT End of Session - 06/12/16 1104    Visit Number 5   Number of Visits 13   Date for PT Re-Evaluation 07/05/16   PT Start Time 1100   PT Stop Time 1145   PT Time Calculation (min) 45 min   Activity Tolerance Patient tolerated treatment well   Behavior During Therapy [facility] for tasks assessed/performed      Past Medical History:  Diagnosis Date  . Cellulitis   . DVT (deep venous thrombosis) ([place]) 2013  . Dysmenorrhea   . History of prothrombin mutation   . Obesity     Past Surgical History:  Procedure Laterality Date  . CHOLECYSTECTOMY    . DILITATION & CURRETTAGE/HYSTROSCOPY WITH ESSURE    . INTRAUTERINE DEVICE (IUD) INSERTION    . LITHOTRIPSY    . TONSILLECTOMY AND ADENOIDECTOMY    . TUBAL LIGATION      There were no vitals filed for this visit.      Subjective Assessment - 06/12/16 1102    Subjective Patinet returns and reports increased low back pain which was increased after the previous treatment, patient states it was so painful she had to call out of work.    Pertinent History Pt complaining of low back pain for about 3-4 years and slowly worsening. She states that she fractured her tailbone when had her son 6 years ago but denies back pain at that time. She did not know about the tailbone fracture until she had her daughter and it was seen on imaging. She states that she had a lot of back pain in pregnancy with her daughter. No history of back trauma around the time of onset 3-4 years ago. She fell a few months ago at work and injured her ankle and right knee but no known back trauma at that time. She saw an orthopedist about 1  year ago about her back pain. He obtained radiographs and stated she had a stress fracture in her low back. Orthopedist gave her a couple stretches and discussed weight loss surgery. She doesn't recall any specific diagnosis from orthopedist. She saw her PCP recently regarding the pain and had repeat radiographs. Radiographs showed no acute abnormalities and only multilevel degenerative changes. Pt reports that pain is midline low back pain from lower lumbar segments to sacrum. Pain does not radiate. Pain wakes her up at night. Stiffness is worse in the morning but pain is worse by the end of day.  Denies N/T. ROS negative for red flags.   Limitations Sitting   How long can you sit comfortably? 30 minutes   How long can you stand comfortably? Indefinitely   How long can you walk comfortably? 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 Yes   Pain Score 3    Pain Location Back   Pain Orientation Lower   Pain Descriptors / Indicators Aching   Pain Type Chronic pain   Pain Onset More than a month ago   Pain Frequency Constant      TREATMENT  Manual Therapy STM to bilateral lumbar paraspinals hypersensitive initially but improves with time; STM to the multifidus bilaterally; L3-L5 grade II PA mobilizations 30s/bout x 2 bouts each level, highly irritable initially but improves following STM between first and second bout;     Ther-ex Posterior pelvic tilts 2s hold x 30 each; Hooklying knees to chest with red physioball - x 30min  Hooklying lumbar rotation rocking with feet on physioball - x 41min Hooklying arms overhead with TrA activation - x 20; x 20 with marches  Seated posterior pelvic tilts on physioball and side of the table - x 30 in each sitting position Standing posterior pelvic tilts - x30  Lateral pelvic lifts in sitting on physioball and  in sititng - x 30 each direction Standing pelvic lifts - x 30 each direction Mini squats with focus on performing posterior pelvic tilt and improving hip extension - x 30        PT Education - 06/12/16 1104    Education provided Yes   Education Details form/technique throughout the session   Person(s) Educated Patient   Methods Explanation;Demonstration   Comprehension Verbalized understanding;Returned demonstration             PT Long Term Goals - 05/25/16 0928      PT LONG TERM GOAL #1   Title Pt will be independent with HEP in order to improve strength and decrease low back pain in order to improve function at home, work, and with leisure activities such as spending time with her children   Time 6   Period Weeks   Status New     PT LONG TERM GOAL #2   Title Pt will decrease mODI scoreby at least 13% in order demonstrate clinically significant reduction in pain/disability    Baseline 05/25/16: 28%   Time 6   Status New     PT LONG TERM GOAL #3   Title Pt will improve hip abduction and extension by at least 1/2 MMT grade to 4/5 in order to improve lumbopelvic stability to decrease pain   Baseline 05/25/16: hip abd/ext 4-/5 bilateral   Time 6   Period Weeks   Status New     PT LONG TERM GOAL #4   Title Pt will be able to demonstrate proper lifting techniques in order to lift her children without increase in her back pain   Time 6   Period Weeks   Status New               Plan - 06/12/16 1117    Clinical Impression Statement Patinet demonstrates improvement in low back pain after performing lumbar pelvic tilts in various positions indicating decreased coordination and pain within the lumbar spine. Patient demonstrates decreased coordination throughout session requiring tactile and verbal cues and pt will benefit from further skilled therapy to return to prior level of function.    Rehab Potential Fair   Clinical Impairments Affecting Rehab Potential Positive:  age, motivation; Negative: obesity, stressors, anxiety   PT Frequency 2x / week   PT Duration 6 weeks   PT Treatment/Interventions ADLs/Self Care Home Management;Aquatic Therapy;Cryotherapy;Electrical Stimulation;Iontophoresis 4mg /ml Dexamethasone;Moist Heat;Traction;Ultrasound;Gait training;Therapeutic activities;Therapeutic exercise;Neuromuscular re-education;Patient/family education;Manual techniques;Passive range of motion;Dry needling   PT Next Visit Plan grade I lumbar mobilizations, lumbar stretches, progress hip, abdominal, and low back strengthening within pt tolerable limits   PT Home Exercise Plan pelvic tilts, single knee to chest, hooklying lumbar rotation rocking, TrA contraction with knee flops and alternating LE lifts   Consulted  and Agree with Plan of Care Patient      Patient will benefit from skilled therapeutic intervention in order to improve the following deficits and impairments:  Obesity, Pain, Decreased strength, Hypomobility  Visit Diagnosis: Chronic midline low back pain without sciatica  Muscle weakness (generalized)     Problem List Patient Active Problem List   Diagnosis Date Noted  . Other irritable bowel syndrome 05/02/2016  . Urinary tract infection 01/21/2015  . Anxiety 01/21/2015  . Nephrolithiasis 01/15/2015  . Abnormal radiologic findings on diagnostic imaging of renal pelvis, ureter, or bladder 01/15/2015  . Low HDL (under 40) 01/01/2015  . Elevated LDL cholesterol level 01/01/2015  . Insomnia 12/16/2014  . Morbid obesity ([place]) 12/11/2014  . Hematuria 12/11/2014  . Preventative health care 12/11/2014  . Low back pain 10/13/2014  . History of prothrombin mutation 10/13/2014    Blythe Stanford, PT DPT 06/12/2016, 1:12 PM  [facility] [address] [place], [address] Phone: [phone]   Fax:  [phone]  Name: Diana Sexton MRN: [id number] Date of Birth:  09/02/1985

## 2016-06-14 ENCOUNTER — Ambulatory Visit (INDEPENDENT_AMBULATORY_CARE_PROVIDER_SITE_OTHER): Payer: BLUE CROSS/BLUE SHIELD | Admitting: Unknown Physician Specialty

## 2016-06-14 ENCOUNTER — Ambulatory Visit: Payer: BLUE CROSS/BLUE SHIELD | Attending: Unknown Physician Specialty

## 2016-06-14 ENCOUNTER — Encounter: Payer: Self-pay | Admitting: Unknown Physician Specialty

## 2016-06-14 DIAGNOSIS — G8929 Other chronic pain: Secondary | ICD-10-CM

## 2016-06-14 DIAGNOSIS — M545 Low back pain, unspecified: Secondary | ICD-10-CM

## 2016-06-14 DIAGNOSIS — K588 Other irritable bowel syndrome: Secondary | ICD-10-CM | POA: Diagnosis not present

## 2016-06-14 DIAGNOSIS — M6281 Muscle weakness (generalized): Secondary | ICD-10-CM | POA: Diagnosis not present

## 2016-06-14 DIAGNOSIS — G4700 Insomnia, unspecified: Secondary | ICD-10-CM

## 2016-06-14 NOTE — Assessment & Plan Note (Signed)
Improved with back pain improved

## 2016-06-14 NOTE — Patient Instructions (Signed)
[email or web address]

## 2016-06-14 NOTE — Therapy (Signed)
[facility] [address] [place], [address] Phone: [phone]   Fax:  [phone]  Physical Therapy Treatment  Patient Details  Name: Diana Sexton MRN: [id number] Date of Birth: 1985-07-10 Referring Provider: Kathrine Haddock, NP  Encounter Date: 06/14/2016      PT End of Session - 06/14/16 0833    Visit Number 6   Number of Visits 13   Date for PT Re-Evaluation 07/05/16   PT Start Time 0818   PT Stop Time 0900   PT Time Calculation (min) 42 min   Activity Tolerance Patient tolerated treatment well   Behavior During Therapy [facility] for tasks assessed/performed      Past Medical History:  Diagnosis Date  . Cellulitis   . DVT (deep venous thrombosis) ([place]) 2013  . Dysmenorrhea   . History of prothrombin mutation   . Obesity     Past Surgical History:  Procedure Laterality Date  . CHOLECYSTECTOMY    . DILITATION & CURRETTAGE/HYSTROSCOPY WITH ESSURE    . INTRAUTERINE DEVICE (IUD) INSERTION    . LITHOTRIPSY    . TONSILLECTOMY AND ADENOIDECTOMY    . TUBAL LIGATION      There were no vitals filed for this visit.      Subjective Assessment - 06/14/16 0826    Subjective Patient reports decreased low back pain today; but reports she conitnues to have increased pain when going to sleep at night.   Pertinent History Pt complaining of low back pain for about 3-4 years and slowly worsening. She states that she fractured her tailbone when had her son 6 years ago but denies back pain at that time. She did not know about the tailbone fracture until she had her daughter and it was seen on imaging. She states that she had a lot of back pain in pregnancy with her daughter. No history of back trauma around the time of onset 3-4 years ago. She fell a few months ago at work and injured her ankle and right knee but no known back trauma at that time. She saw an orthopedist about 1 year ago about her back pain. He  obtained radiographs and stated she had a stress fracture in her low back. Orthopedist gave her a couple stretches and discussed weight loss surgery. She doesn't recall any specific diagnosis from orthopedist. She saw her PCP recently regarding the pain and had repeat radiographs. Radiographs showed no acute abnormalities and only multilevel degenerative changes. Pt reports that pain is midline low back pain from lower lumbar segments to sacrum. Pain does not radiate. Pain wakes her up at night. Stiffness is worse in the morning but pain is worse by the end of day.  Denies N/T. ROS negative for red flags.   Limitations Sitting   How long can you sit comfortably? 30 minutes   How long can you stand comfortably? Indefinitely   How long can you walk comfortably? 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 No/denies   Pain Onset More than a month ago        TREATMENT: Standing marches on airex pad - cueing to decrease speed 2 x 15 B Hip abduction off of 4" step - x20 B Hip hiking off of 4" step - x 20 B Standing hip hiking on flat ground - x 20  Single leg stance ball toss against rebounder - x 20 each LE 4# ball  Rotational lifts with 4# B - x 20 Side stepping with arms away from chest holding hold away from body for multifidus activation - x10 down/back  Seated ball rollouts in sitting with white physioball - x20 Seated multifidus crunch against yellow T - Band - x25  Patient demonstrates no aggravation of symptoms after performing exercises.        PT Education - 06/14/16 0831    Education provided Yes   Education Details HEP: added multifidus crunch    Person(s) Educated Patient   Methods Explanation;Demonstration   Comprehension Verbalized understanding;Returned demonstration             PT Long Term Goals - 05/25/16 0928      PT  LONG TERM GOAL #1   Title Pt will be independent with HEP in order to improve strength and decrease low back pain in order to improve function at home, work, and with leisure activities such as spending time with her children   Time 6   Period Weeks   Status New     PT LONG TERM GOAL #2   Title Pt will decrease mODI scoreby at least 13% in order demonstrate clinically significant reduction in pain/disability    Baseline 05/25/16: 28%   Time 6   Status New     PT LONG TERM GOAL #3   Title Pt will improve hip abduction and extension by at least 1/2 MMT grade to 4/5 in order to improve lumbopelvic stability to decrease pain   Baseline 05/25/16: hip abd/ext 4-/5 bilateral   Time 6   Period Weeks   Status New     PT LONG TERM GOAL #4   Title Pt will be able to demonstrate proper lifting techniques in order to lift her children without increase in her back pain   Time 6   Period Weeks   Status New               Plan - 06/14/16 5397    Clinical Impression Statement Patient demonstrates no resting lumbar pain therefore focused on improving muscular endurance and coordination. Patient demonstrates improved pelvic control today and focused on performing pelvic motions in the standing positions. Patient required increased verbal cueing with performing standing pelvic motions indicating decreased muscular coordination. Patient will benefit from further skilled therapy focused on improving pelvic coordination to allow for the performance of ADLs without pain.     Rehab Potential Fair   Clinical Impairments Affecting Rehab Potential Positive: age, motivation; Negative: obesity, stressors, anxiety   PT Frequency 2x / week   PT Duration 6 weeks   PT Treatment/Interventions ADLs/Self Care Home Management;Aquatic Therapy;Cryotherapy;Electrical Stimulation;Iontophoresis 4mg /ml Dexamethasone;Moist Heat;Traction;Ultrasound;Gait training;Therapeutic activities;Therapeutic exercise;Neuromuscular  re-education;Patient/family education;Manual techniques;Passive range of motion;Dry needling   PT Next Visit Plan grade I lumbar mobilizations, lumbar stretches, progress hip, abdominal, and low back strengthening within pt tolerable limits   PT Home Exercise Plan pelvic tilts, single knee to chest, hooklying lumbar rotation rocking, TrA contraction with knee flops and alternating LE lifts   Consulted and Agree with Plan of Care Patient      Patient will benefit from skilled therapeutic intervention in order to improve the following deficits and impairments:  Obesity, Pain, Decreased strength, Hypomobility  Visit Diagnosis: Chronic midline low back pain without sciatica  Muscle weakness (generalized)     Problem List Patient Active Problem List   Diagnosis Date Noted  .  Other irritable bowel syndrome 05/02/2016  . Urinary tract infection 01/21/2015  . Anxiety 01/21/2015  . Nephrolithiasis 01/15/2015  . Abnormal radiologic findings on diagnostic imaging of renal pelvis, ureter, or bladder 01/15/2015  . Low HDL (under 40) 01/01/2015  . Elevated LDL cholesterol level 01/01/2015  . Insomnia 12/16/2014  . Morbid obesity ([place]) 12/11/2014  . Hematuria 12/11/2014  . Preventative health care 12/11/2014  . Low back pain 10/13/2014  . History of prothrombin mutation 10/13/2014    Blythe Stanford, PT DPT 06/14/2016, 9:04 AM  [facility] [address] [place], [address] Phone: [phone]   Fax:  [phone]  Name: Diana Sexton MRN: [id number] Date of Birth: 03-23-1985

## 2016-06-14 NOTE — Assessment & Plan Note (Signed)
This has improved with food elimination

## 2016-06-14 NOTE — Assessment & Plan Note (Signed)
Doing better with physical therapy.  Continue and progress on to exercise on her own

## 2016-06-14 NOTE — Progress Notes (Signed)
BP 109/69   Pulse 92   Temp 98.3 F (36.8 C)   Wt (!) 310 lb 12.8 oz (141 kg)   SpO2 98%   BMI 51.72 kg/m    Subjective:    Patient ID: Diana Sexton, female    DOB: 08-Dec-1985, 31 y.o.   MRN: [id number]  HPI: Diana Sexton is a 31 y.o. female  Chief Complaint  Patient presents with  . Follow-up    6 week f/up, low back pain  . other    pt states she wants to let us know that her husband has bronchitis   Low back pain Pt is here for f/u of her back pain.  Pt states she is much better and has been able to decrease her anxiety meds she takes for sleep and using it for about 1 time/week.  She plans to continue till the end of her benefit period.  She plans to progress to working out at the gym.    IBS Pt states she realized the can of [employer] flares her problem.    Relevant past medical, surgical, family and social history reviewed and updated as indicated. Interim medical history since our last visit reviewed. Allergies and medications reviewed and updated.  Review of Systems  Constitutional: Negative.   HENT: Negative.   Eyes: Negative.   Cardiovascular: Negative.   Gastrointestinal: Negative.   Endocrine: Negative.   Genitourinary: Negative.   Musculoskeletal: Negative.   Skin: Negative.   Allergic/Immunologic: Negative.   Neurological: Negative.   Hematological: Negative.   Psychiatric/Behavioral: Negative.     Per HPI unless specifically indicated above     Objective:    BP 109/69   Pulse 92   Temp 98.3 F (36.8 C)   Wt (!) 310 lb 12.8 oz (141 kg)   SpO2 98%   BMI 51.72 kg/m   Wt Readings from Last 3 Encounters:  06/14/16 (!) 310 lb 12.8 oz (141 kg)  05/02/16 (!) 313 lb 3.2 oz (142.1 kg)  04/10/16 (!) 311 lb 6.4 oz (141.3 kg)    Physical Exam  Constitutional: She is oriented to person, place, and time. She appears well-developed and well-nourished. No distress.  HENT:  Head: Normocephalic and atraumatic.  Eyes: Conjunctivae and  lids are normal. Right eye exhibits no discharge. Left eye exhibits no discharge. No scleral icterus.  Pulmonary/Chest: Effort normal.  Abdominal: Normal appearance. There is no splenomegaly or hepatomegaly.  Musculoskeletal: Normal range of motion.  Neurological: She is alert and oriented to person, place, and time.  Skin: Skin is intact. No rash noted. No pallor.  Psychiatric: She has a normal mood and affect. Her behavior is normal. Judgment and thought content normal.    Results for orders placed or performed in visit on 05/02/16  Microscopic Examination  Result Value Ref Range   WBC, UA 5-10 (A) 0 - 5 /hpf   RBC, UA 3-10 (A) 0 - 2 /hpf   Epithelial Cells (non renal) 0-10 0 - 10 /hpf   Bacteria, UA None seen None seen/Few   Yeast, UA Present None seen  UA/M w/rflx Culture, Routine  Result Value Ref Range   Specific Gravity, UA 1.025 1.005 - 1.030   pH, UA 5.5 5.0 - 7.5   Color, UA Yellow Yellow   Appearance Ur Clear Clear   Leukocytes, UA Trace (A) Negative   Protein, UA 2+ (A) Negative/Trace   Glucose, UA Negative Negative   Ketones, UA Negative Negative   RBC,  UA 3+ (A) Negative   Bilirubin, UA Negative Negative   Urobilinogen, Ur 0.2 0.2 - 1.0 mg/dL   Nitrite, UA Negative Negative   Microscopic Examination See below:    Urinalysis Reflex Comment   CBC with Differential/Platelet  Result Value Ref Range   WBC 11.6 (H) 3.4 - 10.8 x10E3/uL   RBC 5.46 (H) 3.77 - 5.28 x10E6/uL   Hemoglobin 13.9 11.1 - 15.9 g/dL   Hematocrit 41.7 34.0 - 46.6 %   MCV 76 (L) 79 - 97 fL   MCH 25.5 (L) 26.6 - 33.0 pg   MCHC 33.3 31.5 - 35.7 g/dL   RDW 16.0 (H) 12.3 - 15.4 %   Platelets 389 (H) 150 - 379 x10E3/uL   Neutrophils 72 Not Estab. %   Lymphs 21 Not Estab. %   Monocytes 5 Not Estab. %   Eos 2 Not Estab. %   Basos 0 Not Estab. %   Neutrophils Absolute 8.4 (H) 1.4 - 7.0 x10E3/uL   Lymphocytes Absolute 2.4 0.7 - 3.1 x10E3/uL   Monocytes Absolute 0.5 0.1 - 0.9 x10E3/uL   EOS  (ABSOLUTE) 0.2 0.0 - 0.4 x10E3/uL   Basophils Absolute 0.0 0.0 - 0.2 x10E3/uL   Immature Granulocytes 0 Not Estab. %   Immature Grans (Abs) 0.0 0.0 - 0.1 x10E3/uL  Comprehensive metabolic panel  Result Value Ref Range   Glucose 85 65 - 99 mg/dL   BUN 7 6 - 20 mg/dL   Creatinine, Ser 0.60 0.57 - 1.00 mg/dL   GFR calc non Af Amer 124 >59 mL/min/1.73   GFR calc Af Amer 142 >59 mL/min/1.73   BUN/Creatinine Ratio 12 9 - 23   Sodium 141 134 - 144 mmol/L   Potassium 4.6 3.5 - 5.2 mmol/L   Chloride 100 96 - 106 mmol/L   CO2 24 18 - 29 mmol/L   Calcium 9.8 8.7 - 10.2 mg/dL   Total Protein 7.4 6.0 - 8.5 g/dL   Albumin 4.1 3.5 - 5.5 g/dL   Globulin, Total 3.3 1.5 - 4.5 g/dL   Albumin/Globulin Ratio 1.2 1.2 - 2.2   Bilirubin Total 0.3 0.0 - 1.2 mg/dL   Alkaline Phosphatase 84 39 - 117 IU/L   AST 19 0 - 40 IU/L   ALT 18 0 - 32 IU/L  Celiac Ab tTG DGP TIgA  Result Value Ref Range   IgA/Immunoglobulin A, Serum 372 (H) 87 - 352 mg/dL   Antigliadin Abs, IgA 10 0 - 19 units   Gliadin IgG 2 0 - 19 units   Transglutaminase IgA <2 0 - 3 U/mL   Tissue Transglut Ab <2 0 - 5 U/mL  Urine Culture, Routine  Result Value Ref Range   Urine Culture, Routine Final report    Urine Culture result 1 Comment       Assessment & Plan:   Problem List Items Addressed This Visit      Unprioritized   Insomnia    Improved with back pain improved      Low back pain    Doing better with physical therapy.  Continue and progress on to exercise on her own      Other irritable bowel syndrome    This has improved with food elimination          Follow up plan: Return if symptoms worsen or fail to improve and needs PE.

## 2016-06-20 ENCOUNTER — Ambulatory Visit: Payer: BLUE CROSS/BLUE SHIELD

## 2016-06-20 DIAGNOSIS — M545 Low back pain, unspecified: Secondary | ICD-10-CM

## 2016-06-20 DIAGNOSIS — G8929 Other chronic pain: Secondary | ICD-10-CM | POA: Diagnosis not present

## 2016-06-20 DIAGNOSIS — M6281 Muscle weakness (generalized): Secondary | ICD-10-CM

## 2016-06-20 NOTE — Therapy (Signed)
[facility] [address], [place], [address] Phone: [phone]   Fax:  [phone]  Physical Therapy Treatment  Patient Details  Name: Diana Sexton MRN: [id number] Date of Birth: 07-07-1985 Referring Provider: Kathrine Haddock, NP  Encounter Date: 06/20/2016      PT End of Session - 06/20/16 1006    Visit Number 7   Number of Visits 13   Date for PT Re-Evaluation 07/05/16   PT Start Time 0902   PT Stop Time 0948   PT Time Calculation (min) 46 min   Activity Tolerance Patient tolerated treatment well   Behavior During Therapy [facility] for tasks assessed/performed      Past Medical History:  Diagnosis Date  . Cellulitis   . DVT (deep venous thrombosis) ([place]) 2013  . Dysmenorrhea   . History of prothrombin mutation   . Obesity     Past Surgical History:  Procedure Laterality Date  . CHOLECYSTECTOMY    . DILITATION & CURRETTAGE/HYSTROSCOPY WITH ESSURE    . INTRAUTERINE DEVICE (IUD) INSERTION    . LITHOTRIPSY    . TONSILLECTOMY AND ADENOIDECTOMY    . TUBAL LIGATION      There were no vitals filed for this visit.      Subjective Assessment - 06/20/16 0957    Subjective Patient reports decreased low back pain today and states she only has minor pain that happens "every once in a while".   Pertinent History Pt complaining of low back pain for about 3-4 years and slowly worsening. She states that she fractured her tailbone when had her son 6 years ago but denies back pain at that time. She did not know about the tailbone fracture until she had her daughter and it was seen on imaging. She states that she had a lot of back pain in pregnancy with her daughter. No history of back trauma around the time of onset 3-4 years ago. She fell a few months ago at work and injured her ankle and right knee but no known back trauma at that time. She saw an orthopedist about 1 year ago about her back pain. He obtained  radiographs and stated she had a stress fracture in her low back. Orthopedist gave her a couple stretches and discussed weight loss surgery. She doesn't recall any specific diagnosis from orthopedist. She saw her PCP recently regarding the pain and had repeat radiographs. Radiographs showed no acute abnormalities and only multilevel degenerative changes. Pt reports that pain is midline low back pain from lower lumbar segments to sacrum. Pain does not radiate. Pain wakes her up at night. Stiffness is worse in the morning but pain is worse by the end of day.  Denies N/T. ROS negative for red flags.   Limitations Sitting   How long can you sit comfortably? 30 minutes   How long can you stand comfortably? Indefinitely   How long can you walk comfortably? 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 Yes   Pain Score 1    Pain Location Back   Pain Orientation Lower   Pain Descriptors / Indicators Aching   Pain Type Chronic pain   Pain Onset More than a month ago   Pain Frequency Constant           TREATMENT: Seated multifidus crunch against yellow  T - Band - x25 Seated hip hiking - x 20  Goblet Squats - x 10 with 20# Standing multifidus crunch - x20 Seated ball rollouts in sitting with white physioball - 2x20 Side stepping with arms away from chest holding hold away from body for multifidus activation - x10 down/back B OMEGA #10 Seated on ball weight shifts on white physioball - x20 Leaning forward multifidus straight leg raises - x 20 B   Patient demonstrates aggravation of symptoms with lumbar extension in standing and required frequent movement to decrease pain        PT Education - 06/20/16 1002    Education provided Yes   Education Details Form/technique with exercise   Person(s) Educated Patient   Methods Explanation;Demonstration    Comprehension Verbalized understanding;Returned demonstration             PT Long Term Goals - 05/25/16 0928      PT LONG TERM GOAL #1   Title Pt will be independent with HEP in order to improve strength and decrease low back pain in order to improve function at home, work, and with leisure activities such as spending time with her children   Time 6   Period Weeks   Status New     PT LONG TERM GOAL #2   Title Pt will decrease mODI scoreby at least 13% in order demonstrate clinically significant reduction in pain/disability    Baseline 05/25/16: 28%   Time 6   Status New     PT LONG TERM GOAL #3   Title Pt will improve hip abduction and extension by at least 1/2 MMT grade to 4/5 in order to improve lumbopelvic stability to decrease pain   Baseline 05/25/16: hip abd/ext 4-/5 bilateral   Time 6   Period Weeks   Status New     PT LONG TERM GOAL #4   Title Pt will be able to demonstrate proper lifting techniques in order to lift her children without increase in her back pain   Time 6   Period Weeks   Status New               Plan - 06/20/16 1006    Clinical Impression Statement Patient demonstrates improvement in motor control requiring less cueing to perform movements indicating functional carryover between sessions. Although patient is improving, she demonstrates increased pain with lumbar extension and patient will benefit from further skilled therapy to return to prior level of function.    Rehab Potential Fair   Clinical Impairments Affecting Rehab Potential Positive: age, motivation; Negative: obesity, stressors, anxiety   PT Frequency 2x / week   PT Duration 6 weeks   PT Treatment/Interventions ADLs/Self Care Home Management;Aquatic Therapy;Cryotherapy;Electrical Stimulation;Iontophoresis 4mg /ml Dexamethasone;Moist Heat;Traction;Ultrasound;Gait training;Therapeutic activities;Therapeutic exercise;Neuromuscular re-education;Patient/family education;Manual  techniques;Passive range of motion;Dry needling   PT Next Visit Plan grade I lumbar mobilizations, lumbar stretches, progress hip, abdominal, and low back strengthening within pt tolerable limits   PT Home Exercise Plan pelvic tilts, single knee to chest, hooklying lumbar rotation rocking, TrA contraction with knee flops and alternating LE lifts   Consulted and Agree with Plan of Care Patient      Patient will benefit from skilled therapeutic intervention in order to improve the following deficits and impairments:  Obesity, Pain, Decreased strength, Hypomobility  Visit Diagnosis: Chronic midline low back pain without sciatica  Muscle weakness (generalized)     Problem List Patient Active Problem List   Diagnosis Date Noted  . Other irritable bowel  syndrome 05/02/2016  . Urinary tract infection 01/21/2015  . Anxiety 01/21/2015  . Nephrolithiasis 01/15/2015  . Abnormal radiologic findings on diagnostic imaging of renal pelvis, ureter, or bladder 01/15/2015  . Low HDL (under 40) 01/01/2015  . Elevated LDL cholesterol level 01/01/2015  . Insomnia 12/16/2014  . Morbid obesity ([place]) 12/11/2014  . Hematuria 12/11/2014  . Preventative health care 12/11/2014  . Low back pain 10/13/2014  . History of prothrombin mutation 10/13/2014    Blythe Stanford, PT DPT 06/20/2016, 10:10 AM  [facility] [address] [place], [address] Phone: [phone]   Fax:  [phone]  Name: CREEDENCE HEISS MRN: [id number] Date of Birth: 1985-03-16

## 2016-06-22 ENCOUNTER — Ambulatory Visit: Payer: BLUE CROSS/BLUE SHIELD

## 2016-06-26 ENCOUNTER — Ambulatory Visit: Payer: BLUE CROSS/BLUE SHIELD

## 2016-06-26 DIAGNOSIS — M545 Low back pain: Principal | ICD-10-CM

## 2016-06-26 DIAGNOSIS — M6281 Muscle weakness (generalized): Secondary | ICD-10-CM | POA: Diagnosis not present

## 2016-06-26 DIAGNOSIS — G8929 Other chronic pain: Secondary | ICD-10-CM

## 2016-06-26 NOTE — Therapy (Signed)
[facility] [address], [place], [address] Phone: [phone]   Fax:  [phone]  Physical Therapy Treatment  Patient Details  Name: Diana Sexton MRN: [id number] Date of Birth: [birth year] Referring Provider: Kathrine Haddock, NP  Encounter Date: 06/26/2016      PT End of Session - 06/26/16 0959    Visit Number 8   Number of Visits 13   Date for PT Re-Evaluation 07/05/16   PT Start Time 0930   PT Stop Time 1015   PT Time Calculation (min) 45 min   Activity Tolerance Patient tolerated treatment well   Behavior During Therapy [facility] for tasks assessed/performed      Past Medical History:  Diagnosis Date  . Cellulitis   . DVT (deep venous thrombosis) ([place]) 2013  . Dysmenorrhea   . History of prothrombin mutation   . Obesity     Past Surgical History:  Procedure Laterality Date  . CHOLECYSTECTOMY    . DILITATION & CURRETTAGE/HYSTROSCOPY WITH ESSURE    . INTRAUTERINE DEVICE (IUD) INSERTION    . LITHOTRIPSY    . TONSILLECTOMY AND ADENOIDECTOMY    . TUBAL LIGATION      There were no vitals filed for this visit.      Subjective Assessment - 06/26/16 0943    Subjective Patient reports she's able to correct her pain when it onsets during the day. Patient reports she does not have any current pain.    Pertinent History Pt complaining of low back pain for about 3-4 years and slowly worsening. She states that she fractured her tailbone when had her son 6 years ago but denies back pain at that time. She did not know about the tailbone fracture until she had her daughter and it was seen on imaging. She states that she had a lot of back pain in pregnancy with her daughter. No history of back trauma around the time of onset 3-4 years ago. She fell a few months ago at work and injured her ankle and right knee but no known back trauma at that time. She saw an orthopedist about 1 year ago about her back pain. He  obtained radiographs and stated she had a stress fracture in her low back. Orthopedist gave her a couple stretches and discussed weight loss surgery. She doesn't recall any specific diagnosis from orthopedist. She saw her PCP recently regarding the pain and had repeat radiographs. Radiographs showed no acute abnormalities and only multilevel degenerative changes. Pt reports that pain is midline low back pain from lower lumbar segments to sacrum. Pain does not radiate. Pain wakes her up at night. Stiffness is worse in the morning but pain is worse by the end of day.  Denies N/T. ROS negative for red flags.   Limitations Sitting   How long can you sit comfortably? 30 minutes   How long can you stand comfortably? Indefinitely   How long can you walk comfortably? 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 No/denies   Pain Onset More than a month ago         TREATMENT: Seated multifidus crunch against yellow T - Band - x25 Dead lift with 20# kettlebell - 2 x 10  Single leg deadlift throughout shortened AROM with 20# kettlebell  Posterior pelvic tilt with hip flexion - x20  Squats with 10# weight with focus on improving posterior pelvic tilts - x15 Hip hikes off of 6" step - x15 Seated ball rollouts in sitting with white physioball - 2x20 Multifidus lateral walkouts at [place] - x10 #15   Patient demonstrates decreased symptoms with higher level exercises         PT Education - 06/26/16 0957    Education provided Yes   Education Details form/technique with exercise   Person(s) Educated Patient   Methods Explanation;Demonstration   Comprehension Verbalized understanding;Returned demonstration             PT Long Term Goals - 05/25/16 0928      PT LONG TERM GOAL #1   Title Pt will be independent with HEP in order to improve strength and  decrease low back pain in order to improve function at home, work, and with leisure activities such as spending time with her children   Time 6   Period Weeks   Status New     PT LONG TERM GOAL #2   Title Pt will decrease mODI scoreby at least 13% in order demonstrate clinically significant reduction in pain/disability    Baseline 05/25/16: 28%   Time 6   Status New     PT LONG TERM GOAL #3   Title Pt will improve hip abduction and extension by at least 1/2 MMT grade to 4/5 in order to improve lumbopelvic stability to decrease pain   Baseline 05/25/16: hip abd/ext 4-/5 bilateral   Time 6   Period Weeks   Status New     PT LONG TERM GOAL #4   Title Pt will be able to demonstrate proper lifting techniques in order to lift her children without increase in her back pain   Time 6   Period Weeks   Status New               Plan - 06/26/16 1013    Clinical Impression Statement Patient demonstrates improvement in Lumbar function with ability to performing lifitng activities without onset of lumbar aggravation. Although patient is improving, she continues to demonstrate decreased LE and pelvic strength and will beneift from further skilled therapy to return to prior level of function.   Rehab Potential Fair   Clinical Impairments Affecting Rehab Potential Positive: age, motivation; Negative: obesity, stressors, anxiety   PT Frequency 2x / week   PT Duration 6 weeks   PT Treatment/Interventions ADLs/Self Care Home Management;Aquatic Therapy;Cryotherapy;Electrical Stimulation;Iontophoresis 4mg /ml Dexamethasone;Moist Heat;Traction;Ultrasound;Gait training;Therapeutic activities;Therapeutic exercise;Neuromuscular re-education;Patient/family education;Manual techniques;Passive range of motion;Dry needling   PT Next Visit Plan grade I lumbar mobilizations, lumbar stretches, progress hip, abdominal, and low back strengthening within pt tolerable limits   PT Home Exercise Plan pelvic tilts,  single knee to chest, hooklying lumbar rotation rocking, TrA contraction with knee flops and alternating LE lifts   Consulted and Agree with Plan of Care Patient      Patient will benefit from skilled therapeutic intervention in order to improve the following deficits and impairments:  Obesity, Pain, Decreased strength, Hypomobility  Visit Diagnosis: Chronic midline low back pain without sciatica  Muscle weakness (generalized)     Problem List Patient Active Problem List   Diagnosis Date Noted  . Other irritable bowel syndrome 05/02/2016  . Urinary tract infection 01/21/2015  . Anxiety 01/21/2015  . Nephrolithiasis 01/15/2015  . Abnormal radiologic findings on diagnostic imaging of renal pelvis, ureter, or bladder 01/15/2015  . Low HDL (under 40) 01/01/2015  . Elevated LDL cholesterol level  01/01/2015  . Insomnia 12/16/2014  . Morbid obesity ([place]) 12/11/2014  . Hematuria 12/11/2014  . Preventative health care 12/11/2014  . Low back pain 10/13/2014  . History of prothrombin mutation 10/13/2014    Blythe Stanford, PT DPT 06/26/2016, 10:20 AM  [facility] [address] [place], [address] Phone: [phone]   Fax:  [phone]  Name: NEYTIRI ASCHE MRN: [id number] Date of Birth: April 11, 1985

## 2016-06-28 ENCOUNTER — Ambulatory Visit: Payer: BLUE CROSS/BLUE SHIELD

## 2016-06-28 DIAGNOSIS — G8929 Other chronic pain: Secondary | ICD-10-CM

## 2016-06-28 DIAGNOSIS — M545 Low back pain: Principal | ICD-10-CM

## 2016-06-28 DIAGNOSIS — M6281 Muscle weakness (generalized): Secondary | ICD-10-CM | POA: Diagnosis not present

## 2016-06-28 NOTE — Therapy (Signed)
[facility] [address] [place], [address] Phone: [phone]   Fax:  [phone]  Physical Therapy Treatment  Patient Details  Name: Diana Sexton MRN: [id number] Date of Birth: 1986-01-20 Referring Provider: Kathrine Haddock, NP  Encounter Date: 06/28/2016      PT End of Session - 06/28/16 1018    Visit Number 9   Number of Visits 13   Date for PT Re-Evaluation 07/05/16   PT Start Time 1007   PT Stop Time 1045   PT Time Calculation (min) 38 min   Activity Tolerance Patient tolerated treatment well   Behavior During Therapy [facility] for tasks assessed/performed      Past Medical History:  Diagnosis Date  . Cellulitis   . DVT (deep venous thrombosis) ([place]) 2013  . Dysmenorrhea   . History of prothrombin mutation   . Obesity     Past Surgical History:  Procedure Laterality Date  . CHOLECYSTECTOMY    . DILITATION & CURRETTAGE/HYSTROSCOPY WITH ESSURE    . INTRAUTERINE DEVICE (IUD) INSERTION    . LITHOTRIPSY    . TONSILLECTOMY AND ADENOIDECTOMY    . TUBAL LIGATION      There were no vitals filed for this visit.      Subjective Assessment - 06/28/16 1013    Subjective Patient reports her legs are sore today secondary to yesterday's workout. Patient states no increased low back pain.    Pertinent History Pt complaining of low back pain for about 3-4 years and slowly worsening. She states that she fractured her tailbone when had her son 6 years ago but denies back pain at that time. She did not know about the tailbone fracture until she had her daughter and it was seen on imaging. She states that she had a lot of back pain in pregnancy with her daughter. No history of back trauma around the time of onset 3-4 years ago. She fell a few months ago at work and injured her ankle and right knee but no known back trauma at that time. She saw an orthopedist about 1 year ago about her back pain. He obtained  radiographs and stated she had a stress fracture in her low back. Orthopedist gave her a couple stretches and discussed weight loss surgery. She doesn't recall any specific diagnosis from orthopedist. She saw her PCP recently regarding the pain and had repeat radiographs. Radiographs showed no acute abnormalities and only multilevel degenerative changes. Pt reports that pain is midline low back pain from lower lumbar segments to sacrum. Pain does not radiate. Pain wakes her up at night. Stiffness is worse in the morning but pain is worse by the end of day.  Denies N/T. ROS negative for red flags.   Limitations Sitting   How long can you sit comfortably? 30 minutes   How long can you stand comfortably? Indefinitely   How long can you walk comfortably? 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 No/denies   Pain Onset More than a month ago      TREATMENT: Hip machine in standing - hip abduction 2 x 20 #40; Hip extension 2 x 20 #85 Standing ball toss with elevated foot supported on balance stone - x30; x20  Single leg stance on airex pad  -  x 10 (Reaching out in 3  directions) B Marches on bosu ball (blue side up) - x 20 B Feet together on bosu ball - x30sec (Blue side up) Side stepping up and over Bosu -  x 20 each side   Single leg stance on black side Bosu - 2 x 45sec  Standing Multifidus crunch at [place] - 2 x 20 #10      Patient demonstrates no aggravation of symptoms throughout session.         PT Education - 06/28/16 1018    Education provided Yes   Education Details Form/technique with exercise    Person(s) Educated Patient   Methods Explanation;Demonstration   Comprehension Verbalized understanding;Returned demonstration             PT Long Term Goals - 05/25/16 0928      PT LONG TERM GOAL #1   Title Pt will be independent  with HEP in order to improve strength and decrease low back pain in order to improve function at home, work, and with leisure activities such as spending time with her children   Time 6   Period Weeks   Status New     PT LONG TERM GOAL #2   Title Pt will decrease mODI scoreby at least 13% in order demonstrate clinically significant reduction in pain/disability    Baseline 05/25/16: 28%   Time 6   Status New     PT LONG TERM GOAL #3   Title Pt will improve hip abduction and extension by at least 1/2 MMT grade to 4/5 in order to improve lumbopelvic stability to decrease pain   Baseline 05/25/16: hip abd/ext 4-/5 bilateral   Time 6   Period Weeks   Status New     PT LONG TERM GOAL #4   Title Pt will be able to demonstrate proper lifting techniques in order to lift her children without increase in her back pain   Time 6   Period Weeks   Status New               Plan - 06/28/16 1019    Clinical Impression Statement Focusesed on improving balance and hip endurance/strength today secondary to increased soreness in patient's LE's today. Patient demonstrates increased postural sway with single leg stance indicating decreased propioception along the LEs and patient will benefit from further skilled therapy to return to prior level of function.    Rehab Potential Fair   Clinical Impairments Affecting Rehab Potential Positive: age, motivation; Negative: obesity, stressors, anxiety   PT Frequency 2x / week   PT Duration 6 weeks   PT Treatment/Interventions ADLs/Self Care Home Management;Aquatic Therapy;Cryotherapy;Electrical Stimulation;Iontophoresis 4mg /ml Dexamethasone;Moist Heat;Traction;Ultrasound;Gait training;Therapeutic activities;Therapeutic exercise;Neuromuscular re-education;Patient/family education;Manual techniques;Passive range of motion;Dry needling   PT Next Visit Plan grade I lumbar mobilizations, lumbar stretches, progress hip, abdominal, and low back strengthening within pt  tolerable limits   PT Home Exercise Plan pelvic tilts, single knee to chest, hooklying lumbar rotation rocking, TrA contraction with knee flops and alternating LE lifts   Consulted and Agree with Plan of Care Patient      Patient will benefit from skilled therapeutic intervention in order to improve the following deficits and impairments:  Obesity, Pain, Decreased strength, Hypomobility  Visit Diagnosis: Chronic midline low back pain without sciatica  Muscle weakness (generalized)     Problem List Patient Active Problem List   Diagnosis Date Noted  . Other irritable bowel syndrome 05/02/2016  . Urinary tract infection 01/21/2015  . Anxiety 01/21/2015  . Nephrolithiasis 01/15/2015  .  Abnormal radiologic findings on diagnostic imaging of renal pelvis, ureter, or bladder 01/15/2015  . Low HDL (under 40) 01/01/2015  . Elevated LDL cholesterol level 01/01/2015  . Insomnia 12/16/2014  . Morbid obesity ([place]) 12/11/2014  . Hematuria 12/11/2014  . Preventative health care 12/11/2014  . Low back pain 10/13/2014  . History of prothrombin mutation 10/13/2014    Blythe Stanford, PT DPT 06/28/2016, 10:46 AM  [facility] [address], [place], [address] Phone: [phone]   Fax:  [phone]  Name: Diana Sexton MRN: [id number] Date of Birth: April 27, 1985

## 2016-07-17 ENCOUNTER — Ambulatory Visit: Payer: BLUE CROSS/BLUE SHIELD

## 2016-07-25 ENCOUNTER — Ambulatory Visit: Payer: BLUE CROSS/BLUE SHIELD | Attending: Unknown Physician Specialty

## 2016-08-05 ENCOUNTER — Emergency Department
Admission: EM | Admit: 2016-08-05 | Discharge: 2016-08-05 | Disposition: A | Discharge status: Home / Self Care | Payer: BLUE CROSS/BLUE SHIELD | Attending: Emergency Medicine | Admitting: Emergency Medicine

## 2016-08-05 ENCOUNTER — Encounter: Payer: Self-pay | Admitting: Emergency Medicine

## 2016-08-05 ENCOUNTER — Emergency Department: Payer: BLUE CROSS/BLUE SHIELD

## 2016-08-05 DIAGNOSIS — R102 Pelvic and perineal pain: Secondary | ICD-10-CM | POA: Diagnosis not present

## 2016-08-05 DIAGNOSIS — Z975 Presence of (intrauterine) contraceptive device: Secondary | ICD-10-CM | POA: Diagnosis not present

## 2016-08-05 DIAGNOSIS — N200 Calculus of kidney: Secondary | ICD-10-CM | POA: Diagnosis not present

## 2016-08-05 LAB — CBC WITH DIFFERENTIAL/PLATELET
BASOS PCT: 1 %
Basophils Absolute: 0.1 10*3/uL (ref 0–0.1)
EOS ABS: 0.2 10*3/uL (ref 0–0.7)
Eosinophils Relative: 2 %
HCT: 41.9 % (ref 35.0–47.0)
HEMOGLOBIN: 14.2 g/dL (ref 12.0–16.0)
Lymphocytes Relative: 24 %
Lymphs Abs: 2 10*3/uL (ref 1.0–3.6)
MCH: 25.4 pg — ABNORMAL LOW (ref 26.0–34.0)
MCHC: 34 g/dL (ref 32.0–36.0)
MCV: 74.6 fL — ABNORMAL LOW (ref 80.0–100.0)
MONOS PCT: 6 %
Monocytes Absolute: 0.5 10*3/uL (ref 0.2–0.9)
NEUTROS PCT: 67 %
Neutro Abs: 5.6 10*3/uL (ref 1.4–6.5)
Platelets: 397 10*3/uL (ref 150–440)
RBC: 5.61 MIL/uL — AB (ref 3.80–5.20)
RDW: 15.3 % — ABNORMAL HIGH (ref 11.5–14.5)
WBC: 8.3 10*3/uL (ref 3.6–11.0)

## 2016-08-05 LAB — URINALYSIS, COMPLETE (UACMP) WITH MICROSCOPIC
Bacteria, UA: NONE SEEN
Bilirubin Urine: NEGATIVE
GLUCOSE, UA: NEGATIVE mg/dL
Ketones, ur: NEGATIVE mg/dL
Leukocytes, UA: NEGATIVE
NITRITE: NEGATIVE
Protein, ur: 100 mg/dL — AB
SPECIFIC GRAVITY, URINE: 1.019 (ref 1.005–1.030)
pH: 5 (ref 5.0–8.0)

## 2016-08-05 LAB — COMPREHENSIVE METABOLIC PANEL
ALK PHOS: 75 U/L (ref 38–126)
ALT: 19 U/L (ref 14–54)
AST: 25 U/L (ref 15–41)
Albumin: 3.8 g/dL (ref 3.5–5.0)
Anion gap: 9 (ref 5–15)
BILIRUBIN TOTAL: 0.5 mg/dL (ref 0.3–1.2)
BUN: 11 mg/dL (ref 6–20)
CALCIUM: 9.6 mg/dL (ref 8.9–10.3)
CO2: 27 mmol/L (ref 22–32)
CREATININE: 0.65 mg/dL (ref 0.44–1.00)
Chloride: 102 mmol/L (ref 101–111)
Glucose, Bld: 96 mg/dL (ref 65–99)
Potassium: 4.1 mmol/L (ref 3.5–5.1)
Sodium: 138 mmol/L (ref 135–145)
Total Protein: 8.2 g/dL — ABNORMAL HIGH (ref 6.5–8.1)

## 2016-08-05 LAB — LIPASE, BLOOD: LIPASE: 22 U/L (ref 11–51)

## 2016-08-05 LAB — PREGNANCY, URINE: Preg Test, Ur: NEGATIVE

## 2016-08-05 LAB — POCT PREGNANCY, URINE: PREG TEST UR: NEGATIVE

## 2016-08-05 MED ORDER — ACETAMINOPHEN 325 MG PO TABS
650.0000 mg | ORAL_TABLET | Freq: Four times a day (QID) | ORAL | Status: DC | PRN
  Administered 2016-08-05: 650 mg via ORAL
  Filled 2016-08-05: qty 2

## 2016-08-05 NOTE — Discharge Instructions (Signed)
Return to the emergency room for any change in her chronic pain, obviously, as we discussed without pelvic exam there are some things we cannot rule out an we strongly advise close follow-up with OB/GYN.

## 2016-08-05 NOTE — ED Triage Notes (Signed)
Patient to ER for c/o left pelvic/groin pain x2-3 weeks intermittently. States when pain occurs, "it takes my breath away".

## 2016-08-05 NOTE — ED Provider Notes (Signed)
[facility] Emergency Department Provider Note  ____________________________________________   I have reviewed the triage vital signs and the nursing notes.   HISTORY  Chief Complaint Pelvic Pain    HPI Diana Sexton is a 31 y.o. female  who presents today complaining of pelvic pain for 2 years. Sometimes better sometimes worse. Sometimes it is worse with her menstrual. However she also has a history of kidney stones. She states is never really gone away. She has had prior CT scans from it. She does however that she has large kidney stones in the left. She's had no dysuria no hematuria urinary frequency she denies productive. She has had her tubes tied. She has had an IUD because of menstrual pain in the past. She is on her period at this time. She declines pelvic exam. She wants to make sure that this is not from her kidney stones.Pain is sharp, nothing makes it better, sometimes worse with her menstrual period, patient states that she has been diagnosed with constipation for this when she saw her doctor for a few weeks ago but she is not sure if that's what's going on. She feels that she's having normal bowel movements      Past Medical History:  Diagnosis Date  . Cellulitis   . DVT (deep venous thrombosis) ([place]) 2013  . Dysmenorrhea   . History of prothrombin mutation   . Obesity     Patient Active Problem List   Diagnosis Date Noted  . Other irritable bowel syndrome 05/02/2016  . Urinary tract infection 01/21/2015  . Anxiety 01/21/2015  . Nephrolithiasis 01/15/2015  . Abnormal radiologic findings on diagnostic imaging of renal pelvis, ureter, or bladder 01/15/2015  . Low HDL (under 40) 01/01/2015  . Elevated LDL cholesterol level 01/01/2015  . Insomnia 12/16/2014  . Morbid obesity ([place]) 12/11/2014  . Hematuria 12/11/2014  . Preventative health care 12/11/2014  . Low back pain 10/13/2014  . History of prothrombin mutation 10/13/2014    Past  Surgical History:  Procedure Laterality Date  . CHOLECYSTECTOMY    . DILITATION & CURRETTAGE/HYSTROSCOPY WITH ESSURE    . INTRAUTERINE DEVICE (IUD) INSERTION    . LITHOTRIPSY    . TONSILLECTOMY AND ADENOIDECTOMY    . TUBAL LIGATION      Prior to Admission medications   Medication Sig Start Date End Date Taking? Authorizing Provider  albuterol (PROVENTIL HFA;VENTOLIN HFA) 108 (90 Base) MCG/ACT inhaler Inhale 2 puffs into the lungs every 6 (six) hours as needed for wheezing or shortness of breath. 11/09/15   Volney American, PA-C  fluticasone [facility]) 50 MCG/ACT nasal spray Place 2 sprays into both nostrils daily. Patient not taking: Reported on 06/14/2016 11/09/15   Volney American, PA-C  traZODone (DESYREL) 50 MG tablet Take 0.5-1 tablets (25-50 mg total) by mouth at bedtime as needed for sleep. 04/06/16   Volney American, PA-C    Allergies Penicillins  Family History  Problem Relation Age of Onset  . Cancer Mother        breast  . Diabetes Father   . Hypertension Father   . Heart disease Maternal Grandfather   . Dementia Paternal Grandmother     Social History Social History  Substance Use Topics  . Smoking status: Never Smoker  . Smokeless tobacco: Never Used  . Alcohol use Yes     Comment: rare    Review of Systems Constitutional: No fever/chills Eyes: No visual changes. ENT: No sore throat. No stiff neck  no neck pain Cardiovascular: Denies chest pain. Respiratory: Denies shortness of breath. Gastrointestinal:   no vomiting.  No diarrhea.  No constipation. Genitourinary: Negative for dysuria. Musculoskeletal: Negative lower extremity swelling Skin: Negative for rash. Neurological: Negative for severe headaches, focal weakness or numbness.   ____________________________________________   PHYSICAL EXAM:  VITAL SIGNS: ED Triage Vitals  Enc Vitals Group     BP 08/05/16 1101 124/73     Pulse Rate 08/05/16 1101 92     Resp 08/05/16 1101 20      Temp 08/05/16 1101 98 F (36.7 C)     Temp Source 08/05/16 1101 Oral     SpO2 08/05/16 1101 98 %     Weight 08/05/16 1103 300 lb (136.1 kg)     Height 08/05/16 1103 5\' 5"  (1.651 m)     Head Circumference --      Peak Flow --      Pain Score 08/05/16 1101 6     Pain Loc --      Pain Edu? --      Excl. in [place]? --     Constitutional: Alert and oriented. Well appearing and in no acute distress. Eyes: Conjunctivae are normal Head: Atraumatic HEENT: No congestion/rhinnorhea. Mucous membranes are moist.  Oropharynx non-erythematous Neck:   Nontender with no meningismus, no masses, no stridor Cardiovascular: Normal rate, regular rhythm. Grossly normal heart sounds.  Good peripheral circulation. Respiratory: Normal respiratory effort.  No retractions. Lungs CTAB. Abdominal: Soft and Obese, mild tenderness to the left lower quadrant nonsurgical. No distention. No guarding no rebound Back:  There is no focal tenderness or step off.  there is no midline tenderness there are no lesions noted. there is no CVA tenderness GU: Patient declines Musculoskeletal: No lower extremity tenderness, no upper extremity tenderness. No joint effusions, no DVT signs strong distal pulses no edema Neurologic:  Normal speech and language. No gross focal neurologic deficits are appreciated.  Skin:  Skin is warm, dry and intact. No rash noted. Psychiatric: Mood and affect are normal. Speech and behavior are normal.  ____________________________________________   LABS (all labs ordered are listed, but only abnormal results are displayed)  Labs Reviewed  URINALYSIS, COMPLETE (UACMP) WITH MICROSCOPIC - Abnormal; Notable for the following:       Result Value   Color, Urine YELLOW (*)    APPearance HAZY (*)    Hgb urine dipstick MODERATE (*)    Protein, ur 100 (*)    Squamous Epithelial / LPF 0-5 (*)    All other components within normal limits  COMPREHENSIVE METABOLIC PANEL - Abnormal; Notable for the  following:    Total Protein 8.2 (*)    All other components within normal limits  CBC WITH DIFFERENTIAL/PLATELET - Abnormal; Notable for the following:    RBC 5.61 (*)    MCV 74.6 (*)    MCH 25.4 (*)    RDW 15.3 (*)    All other components within normal limits  PREGNANCY, URINE  LIPASE, BLOOD  POCT PREGNANCY, URINE   ____________________________________________  EKG  I personally interpreted any EKGs ordered by me or triage  ____________________________________________  [id number]  I reviewed any imaging ordered by me or triage that were performed during my shift and, if possible, patient and/or family made aware of any abnormal findings. ____________________________________________   PROCEDURES  Procedure(s) performed: None  Procedures  Critical Care performed: None  ____________________________________________   INITIAL IMPRESSION / ASSESSMENT AND PLAN / ED COURSE  Pertinent labs & imaging  results that were available during my care of the patient were reviewed by me and considered in my medical decision making (see chart for details).  Patient no acute distress complaining of chronic lower abdominal pain she has a nonsurgical abdomen. Given her history of sizable kidney stones I did do a CT scan as it is been sometime since that happened. Some lymphadenopathy with Abilify which about the patient about that there is no evidence of kidney stones. Patient does have hematuria as she has had multiple times in the past however she is on her menses. She declined pelvic exam and she declined a catheter urine. Obviously this limits our ability to perform diagnostic evaluation. However, given that the fact that the patient has had pain for 2 years, I am reassured by all of her findings here. Patient is requesting discharge and will follow up with her OB and have suggested the symptoms. The of this, cramping pain with an IUD and she should follow-up with her doctor. Nothing to  suggest PID this time, no evidence of acute urinary infection she is made aware of all CT findings and she will follow closely with primary and OB    ____________________________________________   FINAL CLINICAL IMPRESSION(S) / ED DIAGNOSES  Final diagnoses:  None      This chart was dictated using voice recognition software.  Despite best efforts to proofread,  errors can occur which can change meaning.      Schuyler Amor, MD 08/05/16 [phone]

## 2016-08-07 ENCOUNTER — Encounter: Payer: Self-pay | Admitting: Obstetrics and Gynecology

## 2016-08-07 ENCOUNTER — Ambulatory Visit (INDEPENDENT_AMBULATORY_CARE_PROVIDER_SITE_OTHER): Payer: BLUE CROSS/BLUE SHIELD | Admitting: Obstetrics and Gynecology

## 2016-08-07 VITALS — BP 128/72 | HR 82 | Ht 65.0 in | Wt 309.0 lb

## 2016-08-07 DIAGNOSIS — Z30432 Encounter for removal of intrauterine contraceptive device: Secondary | ICD-10-CM | POA: Diagnosis not present

## 2016-08-07 DIAGNOSIS — R102 Pelvic and perineal pain: Secondary | ICD-10-CM | POA: Diagnosis not present

## 2016-08-07 NOTE — Progress Notes (Signed)
   Chief Complaint  Patient presents with  . Contraception    IUD removal due to pelvic pain     History of Present Illness:  Diana Sexton is a 31 y.o. that had a Mirena IUD placed approximately 2.5 years ago. Since that time, she has had pelvic pain off and on and ovar cysts, last documented 2/18 RTO. She is s/p BTL but had the IUD placed due to DUB/menorrhagia. She went to ED 08/05/16 due to pelvic pain over the past wk. She always has a constant pain but then she has episodes of severe pain over a several day period. She seems to have Q1-2 monthly spotting for several days and increased sx may correspond to her menses. She would just like IUD removed and re-eval menses/pain.  BP 128/72   Pulse 82   Ht 5\' 5"  (1.651 m)   Wt (!) 309 lb (140.2 kg)   LMP  (Exact Date) Comment: spotting only with IUD  BMI 51.42 kg/m   Pelvic exam:  Two IUD strings present seen coming from the cervical os. EGBUS, vaginal vault and cervix: within normal limits  IUD Removal Strings of IUD identified and grasped.  IUD removed without problem with ring forceps.  Pt tolerated this well.  IUD noted to be intact.  Assessment:  IUD Removal Encounter for IUD removal - Pt tolerated well.  Pelvic pain - F/u with Dr. Glennon Mac for annual in 1 mo. See if pain/menses improve without IUD.   Plan: IUD removed and plan for contraception is bilateral tubal ligation. She was amenable to this plan.  Alicia B. Copland, PA-C 08/07/2016 11:03 AM

## 2016-09-04 ENCOUNTER — Ambulatory Visit: Payer: BLUE CROSS/BLUE SHIELD | Admitting: Obstetrics and Gynecology

## 2016-10-09 ENCOUNTER — Ambulatory Visit: Payer: BLUE CROSS/BLUE SHIELD | Admitting: Obstetrics and Gynecology

## 2016-10-09 ENCOUNTER — Telehealth: Payer: Self-pay

## 2016-10-09 NOTE — Telephone Encounter (Signed)
Pt has questions re period after IUD removed.  It was removed in June d/t pain and discomfort.  Started last Sunday, heavy, uses tampon and a pad in case tampon doesn't hold it all.  No other bc given b/c pt has hx of clotting disorder.  Adv periods tend to go back to the way they were before bc was ever started.  Pt aware to monitor bleeding for two more months since this is the first period since removal.  To be seen if starts to feel bad, dizzy, weak.  To go to ED if saturated a pad q16min to 1hr - it's currently about every two hours.

## 2016-10-11 ENCOUNTER — Ambulatory Visit: Payer: BLUE CROSS/BLUE SHIELD | Admitting: Maternal Newborn

## 2016-10-13 ENCOUNTER — Encounter: Payer: Self-pay | Admitting: Obstetrics and Gynecology

## 2016-10-13 ENCOUNTER — Ambulatory Visit (INDEPENDENT_AMBULATORY_CARE_PROVIDER_SITE_OTHER): Payer: BLUE CROSS/BLUE SHIELD | Admitting: Obstetrics and Gynecology

## 2016-10-13 ENCOUNTER — Telehealth: Payer: Self-pay | Admitting: Obstetrics and Gynecology

## 2016-10-13 VITALS — BP 128/84 | Wt 302.0 lb

## 2016-10-13 DIAGNOSIS — Z862 Personal history of diseases of the blood and blood-forming organs and certain disorders involving the immune mechanism: Secondary | ICD-10-CM

## 2016-10-13 DIAGNOSIS — N921 Excessive and frequent menstruation with irregular cycle: Secondary | ICD-10-CM | POA: Diagnosis not present

## 2016-10-13 MED ORDER — MEDROXYPROGESTERONE ACETATE 10 MG PO TABS: 10.0000 mg | ORAL_TABLET | Freq: Every day | ORAL | 4 refills | Status: DC

## 2016-10-13 NOTE — Progress Notes (Signed)
Obstetrics & Gynecology Office Visit   Chief Complaint  Patient presents with  . Menstrual Problem    History of Present Illness: 31 y.o. [id number] female who presents for evaluation of pelvic pain and abnormal uterine bleeding.  She had her Mirena IUD removed about 2 months ago  She reports a history of nearly-constant pelvic discomfort with episodes of worsening of her pain when she has vaginal bleeding.  She states that she has had ovarian cysts when her IUD had been present, also.  Her pain is described as generally pelvic without general laterally. The pain is cramp-like.  She had mostly spotting when her IUD was present.  Her pain does radiate to her back, is not made better or worse by any modifying factors.  Since she had her IUD removed two months ago, her pain is much improved.  She has had some heavy menstrual bleeding since her IUD has come out.  The bleeding has been irregular.  She would like to have some way to manage her heavy bleeding in the absence of her IUD.  Notably she has a history of DVT.  She had her IUD placed due to abnormal uterine bleeding. She has undergone a hysteroscopy, D&C in 2016.  Also, given her morbid obesity and high risk factors for definitive surgery, the IUD was originally placed.  Her last pap smear was 3 years ago and was normal.  She had a CT scan in the ER in June that showed no adnexal abnormalities.    Past Medical History:  Diagnosis Date  . Cellulitis   . DVT (deep venous thrombosis) ([place]) 2013  . Dysmenorrhea   . Family history of breast cancer 11/2013   BRCA/MyRisk neg  . History of prothrombin mutation   . Increased risk of breast cancer 11/2013   IBIS=27%  . Obesity     Past Surgical History:  Procedure Laterality Date  . CHOLECYSTECTOMY    . DILITATION & CURRETTAGE/HYSTROSCOPY WITH ESSURE    . INTRAUTERINE DEVICE (IUD) INSERTION    . LITHOTRIPSY    . TONSILLECTOMY AND ADENOIDECTOMY    . TUBAL LIGATION      Gynecologic History:  Patient's last menstrual period was 10/01/2016.  Obstetric History: [id number]  Family History  Problem Relation Age of Onset  . Cancer Mother        breast  . Diabetes Father   . Hypertension Father   . Heart disease Maternal Grandfather   . Dementia Paternal Grandmother     Social History   Social History  . Marital status: Married    Spouse name: N/A  . Number of children: N/A  . Years of education: N/A   Occupational History  . Not on file.   Social History Main Topics  . Smoking status: Never Smoker  . Smokeless tobacco: Never Used  . Alcohol use Yes     Comment: rare  . Drug use: No  . Sexual activity: Yes    Birth control/ protection: IUD, Surgical     Comment: tubal ligation   Other Topics Concern  . Not on file   Social History Narrative  . No narrative on file    Allergies  Allergen Reactions  . Penicillins Rash    Prior to Admission medications   Medication Sig Start Date End Date Taking? Authorizing Provider  albuterol (PROVENTIL HFA;VENTOLIN HFA) 108 (90 Base) MCG/ACT inhaler Inhale 2 puffs into the lungs every 6 (six) hours as needed for wheezing or shortness of breath. 11/09/15  Volney American, PA-C  fluticasone [facility]) 50 MCG/ACT nasal spray Place 2 sprays into both nostrils daily. 11/09/15   Volney American, PA-C  HYDROcodone-acetaminophen (NORCO/VICODIN) 5-325 MG tablet hydrocodone 5 mg-acetaminophen 325 mg tablet    [provider]  ibuprofen (ADVIL,MOTRIN) 600 MG tablet ibuprofen 600 mg tablet    [provider]  levonorgestrel (MIRENA) 20 MCG/24HR IUD 1 each by Intrauterine route once.    [provider]  traZODone (DESYREL) 50 MG tablet Take 0.5-1 tablets (25-50 mg total) by mouth at bedtime as needed for sleep. 04/06/16   Volney American, PA-C    Review of Systems  Constitutional: Negative.   HENT: Negative.   Eyes: Negative.   Cardiovascular: Negative.   Gastrointestinal: Positive for  abdominal pain (per HPI). Negative for blood in stool, constipation, diarrhea, heartburn, melena, nausea and vomiting.  Genitourinary: Negative for dysuria, frequency, hematuria and urgency.  Musculoskeletal: Negative.   Skin: Negative.   Endo/Heme/Allergies: Negative.   Psychiatric/Behavioral: Negative.      Physical Exam BP 128/84   Wt (!) 302 lb (137 kg)   LMP 10/01/2016   BMI 50.26 kg/m  Patient's last menstrual period was 10/01/2016. Physical Exam  Constitutional: She is oriented to person, place, and time. She appears well-developed and well-nourished. No distress.  Genitourinary:  Genitourinary Comments: Deferred today  HENT:  Head: Normocephalic and atraumatic.  Eyes: EOM are normal. No scleral icterus.  Neck: Normal range of motion. Neck supple.  Cardiovascular: Normal rate and regular rhythm.  Exam reveals no friction rub.   No murmur heard. Pulmonary/Chest: Effort normal and breath sounds normal. No respiratory distress. She has no wheezes. She has no rales.  Abdominal: Soft. Bowel sounds are normal. She exhibits no distension and no mass. There is no tenderness. There is no rebound and no guarding.  Exam limited by body habitus  Musculoskeletal: Normal range of motion. She exhibits no edema.  Neurological: She is alert and oriented to person, place, and time. No cranial nerve deficit.  Skin: Skin is warm and dry. No erythema.  Psychiatric: She has a normal mood and affect. Her behavior is normal. Judgment normal.   Female chaperone present for pelvic and breast  portions of the physical exam  Assessment: 31 y.o. [id number] female with abnormal uterine bleeding (menorrhagia with irregular cycles).  Plan: Problem List Items Addressed This Visit    History of prothrombin mutation   Morbid obesity ([place])   Menorrhagia with irregular cycle - Primary   Relevant Medications   medroxyPROGESTERone (PROVERA) 10 MG tablet     Discussed need to avoid estrogen-containing  medication.  Will for now will attempt intermittent progesterone therapy to give her some regularity to her menses and give her some endometrial protection from what is likely exposure to excess estrogen.  Will follow up in 3 months with her to assess her symptoms. She will need a pap smear at that time given that it has been 3 years since her last one.  Discussed different methods of managing her abnormal menses.  Observation is not an option as she has failed this in the past.  She has tried and did well with the IUD, but had symptoms of discomfort with the IUD requiring multiple clinic visits.  She has had improvement in her discomfort with the IUD being out.  We did discuss ablation and hysterectomy.  She is at high risk for failing an ablation and she is at high risk for over-exposure to estrogen given her  morbid obesity.  She also would be a very high risk patient for a hysterectomy given her morbid obesity, and coagulopathy, though this might well be the best long-term option for her, if we are unable to improve her symptoms from a medical standpoint.  Weight loss encouraged as a way of improving her symptoms, as well.     Prentice Docker, MD 10/17/2016 11:28 AM

## 2016-10-13 NOTE — Telephone Encounter (Signed)
Ptis calling due her progesterone not being at pharmacy. Please advise. [facility] in [place]

## 2016-10-18 ENCOUNTER — Ambulatory Visit: Payer: BLUE CROSS/BLUE SHIELD | Admitting: Obstetrics and Gynecology

## 2016-10-22 DIAGNOSIS — Z3202 Encounter for pregnancy test, result negative: Secondary | ICD-10-CM | POA: Diagnosis not present

## 2016-10-22 DIAGNOSIS — R300 Dysuria: Secondary | ICD-10-CM | POA: Diagnosis not present

## 2016-10-30 ENCOUNTER — Telehealth: Payer: Self-pay

## 2016-10-30 NOTE — Telephone Encounter (Signed)
Pt has questions about her period.  [phone]

## 2016-10-30 NOTE — Telephone Encounter (Signed)
Pt wanted to discuss her heavy bleeding and was not sure if she needed her f/u appt to be sooner than the one she has scheduled. Pt states she is going through a pad every hour. Only second day of her cycle, so I advised her if it gets worse and she starts going through a pad every 30 min to call me and we would go from there. Pt appreciative and will call back within a day or two.

## 2016-12-20 ENCOUNTER — Telehealth: Payer: Self-pay

## 2016-12-20 DIAGNOSIS — N921 Excessive and frequent menstruation with irregular cycle: Secondary | ICD-10-CM

## 2016-12-20 NOTE — Telephone Encounter (Signed)
Pt sees SDJ. She has apt 01/17/17. She takes a rx from the 1st thru the 10th of each month. Her rx runs out. She is inquiring if she should refill it & start it & see him in the middle of the rx or if she is to discontinue until she sees him. ER#[phone]

## 2016-12-21 MED ORDER — MEDROXYPROGESTERONE ACETATE 10 MG PO TABS: 10.0000 mg | ORAL_TABLET | Freq: Every day | ORAL | 4 refills | Status: DC

## 2016-12-21 NOTE — Telephone Encounter (Signed)
Rx call in for patient. She should continue taking it until she sees me.  If she is seeing me in the middle of the month. She could hold off taking it and start it perhaps on the 10th of the December so that she will not be having her period when she sees me.

## 2016-12-21 NOTE — Telephone Encounter (Signed)
Please advise 

## 2016-12-22 NOTE — Telephone Encounter (Signed)
Pt aware.

## 2016-12-25 ENCOUNTER — Encounter: Payer: Self-pay | Admitting: Family Medicine

## 2016-12-25 ENCOUNTER — Ambulatory Visit: Payer: BLUE CROSS/BLUE SHIELD | Admitting: Family Medicine

## 2016-12-25 VITALS — BP 146/86 | HR 94 | Temp 98.6°F | Wt 314.0 lb

## 2016-12-25 DIAGNOSIS — R05 Cough: Secondary | ICD-10-CM | POA: Diagnosis not present

## 2016-12-25 DIAGNOSIS — J069 Acute upper respiratory infection, unspecified: Secondary | ICD-10-CM | POA: Diagnosis not present

## 2016-12-25 NOTE — Progress Notes (Signed)
   BP (!) 146/86 (BP Location: Right Wrist, Cuff Size: Normal)   Pulse 94   Temp 98.6 F (37 C) (Oral)   Wt (!) 314 lb (142.4 kg)   BMI 52.25 kg/m    Subjective:    Patient ID: Diana Sexton, female    DOB: May 10, 1985, 31 y.o.   MRN: [id number]  HPI: Diana Sexton is a 31 y.o. female  Chief Complaint  Patient presents with  . Nasal Congestion    with flu like symptoms    Having chills, HA, congestion, chest soreness with inhalation x 1 day. Denies fevers, sweats, body aches, N/V/D, cough. Taking sudafed with no relief. No sick contacts known.   Relevant past medical, surgical, family and social history reviewed and updated as indicated. Interim medical history since our last visit reviewed. Allergies and medications reviewed and updated.  Review of Systems  Constitutional: Positive for chills.  HENT: Positive for congestion.   Eyes: Negative.   Respiratory: Positive for chest tightness.   Cardiovascular: Negative.   Gastrointestinal: Negative.   Musculoskeletal: Negative.   Neurological: Positive for headaches.  Psychiatric/Behavioral: Negative.     Per HPI unless specifically indicated above     Objective:    BP (!) 146/86 (BP Location: Right Wrist, Cuff Size: Normal)   Pulse 94   Temp 98.6 F (37 C) (Oral)   Wt (!) 314 lb (142.4 kg)   BMI 52.25 kg/m   Wt Readings from Last 3 Encounters:  12/25/16 (!) 314 lb (142.4 kg)  10/13/16 (!) 302 lb (137 kg)  08/07/16 (!) 309 lb (140.2 kg)    Physical Exam  Constitutional: She is oriented to person, place, and time. She appears well-developed and well-nourished. No distress.  HENT:  Head: Atraumatic.  Right Ear: External ear normal.  Left Ear: External ear normal.  Nasal mucosa erythematous without drainage Oropharynx mildly erythematous  Eyes: Conjunctivae are normal. Pupils are equal, round, and reactive to light.  Neck: Normal range of motion. Neck supple.  Cardiovascular: Normal rate and normal heart  sounds.  Pulmonary/Chest: Effort normal and breath sounds normal. No respiratory distress. She has no wheezes. She has no rales.  Musculoskeletal: Normal range of motion.  Neurological: She is alert and oriented to person, place, and time.  Skin: Skin is warm and dry.  Psychiatric: She has a normal mood and affect. Her behavior is normal.  Nursing note and vitals reviewed.     Assessment & Plan:   Problem List Items Addressed This Visit    None    Visit Diagnoses    Viral URI    -  Primary   rapid flu negative, discussed continued OTC remedies, supportive care. F/u if worsening or no improvement   Relevant Orders   Veritor Flu A/B Waived       Follow up plan: Return if symptoms worsen or fail to improve.

## 2016-12-25 NOTE — Patient Instructions (Signed)
Follow up as needed

## 2016-12-26 LAB — VERITOR FLU A/B WAIVED
INFLUENZA A: NEGATIVE
INFLUENZA B: NEGATIVE

## 2016-12-30 ENCOUNTER — Encounter: Payer: Self-pay | Admitting: Family Medicine

## 2017-01-17 ENCOUNTER — Ambulatory Visit: Payer: BLUE CROSS/BLUE SHIELD | Admitting: Obstetrics and Gynecology

## 2017-01-17 ENCOUNTER — Encounter: Payer: Self-pay | Admitting: Obstetrics and Gynecology

## 2017-01-17 VITALS — BP 118/74 | Ht 65.0 in | Wt 308.0 lb

## 2017-01-17 DIAGNOSIS — Z862 Personal history of diseases of the blood and blood-forming organs and certain disorders involving the immune mechanism: Secondary | ICD-10-CM

## 2017-01-17 DIAGNOSIS — N921 Excessive and frequent menstruation with irregular cycle: Secondary | ICD-10-CM | POA: Diagnosis not present

## 2017-01-17 MED ORDER — MEDROXYPROGESTERONE ACETATE 10 MG PO TABS: 10.0000 mg | ORAL_TABLET | Freq: Every day | ORAL | 6 refills | Status: DC

## 2017-01-17 NOTE — Progress Notes (Signed)
Obstetrics & Gynecology Office Visit   Chief Complaint  Patient presents with  . Follow-up  heavy menstrual bleeding  History of Present Illness: 31 y.o. [id number] female presenting for follow up after starting medication therapy for heavy, painful menses after having her IUD removed.  She was started on Provera 10 mg po daily x 10 days each month for estrogen opposition.  She has done well on the medication. She states she gets a headache day #1 of taking the medication and some breast tenderness. However, her menses are much shorter in duration and flow and she is quite happy with the way they are going now.  She would like to continue the medication.   Past Medical History:  Diagnosis Date  . Cellulitis   . DVT (deep venous thrombosis) ([place]) 2013  . Dysmenorrhea   . Family history of breast cancer 11/2013   BRCA/MyRisk neg  . History of prothrombin mutation   . Increased risk of breast cancer 11/2013   IBIS=27%  . Obesity     Past Surgical History:  Procedure Laterality Date  . CHOLECYSTECTOMY    . DILITATION & CURRETTAGE/HYSTROSCOPY WITH ESSURE    . INTRAUTERINE DEVICE (IUD) INSERTION    . LITHOTRIPSY    . TONSILLECTOMY AND ADENOIDECTOMY    . TUBAL LIGATION      Gynecologic History: Patient's last menstrual period was 12/26/2016.  Obstetric History: [id number]  Family History  Problem Relation Age of Onset  . Cancer Mother        breast  . Diabetes Father   . Hypertension Father   . Heart disease Maternal Grandfather   . Dementia Paternal Grandmother     Social History   Socioeconomic History  . Marital status: Married    Spouse name: Not on file  . Number of children: Not on file  . Years of education: Not on file  . Highest education level: Not on file  Social Needs  . Financial resource strain: Not on file  . Food insecurity - worry: Not on file  . Food insecurity - inability: Not on file  . Transportation needs - medical: Not on file  . Transportation  needs - non-medical: Not on file  Occupational History  . Not on file  Tobacco Use  . Smoking status: Never Smoker  . Smokeless tobacco: Never Used  Substance and Sexual Activity  . Alcohol use: Yes    Comment: rare  . Drug use: No  . Sexual activity: Yes    Birth control/protection: IUD, Surgical    Comment: tubal ligation  Other Topics Concern  . Not on file  Social History Narrative  . Not on file    Allergies  Allergen Reactions  . Penicillins Rash    Prior to Admission medications   Medication Sig Start Date End Date Taking? Authorizing Provider  ibuprofen (ADVIL,MOTRIN) 600 MG tablet ibuprofen 600 mg tablet   Yes [provider]  pseudoephedrine (SUDAFED) 30 MG tablet Take 30 mg every 4 (four) hours as needed by mouth for congestion.   Yes [provider]  traZODone (DESYREL) 50 MG tablet Take 0.5-1 tablets (25-50 mg total) by mouth at bedtime as needed for sleep. 04/06/16  Yes Volney American, PA-C  albuterol (PROVENTIL HFA;VENTOLIN HFA) 108 (90 Base) MCG/ACT inhaler Inhale 2 puffs into the lungs every 6 (six) hours as needed for wheezing or shortness of breath. 11/09/15   Volney American, PA-C  fluticasone [facility]) 50 MCG/ACT nasal spray Place 2 sprays  into both nostrils daily. Patient not taking: Reported on 12/25/2016 11/09/15   Volney American, PA-C  HYDROcodone-acetaminophen (NORCO/VICODIN) 5-325 MG tablet hydrocodone 5 mg-acetaminophen 325 mg tablet    [provider]  medroxyPROGESTERone (PROVERA) 10 MG tablet Take 1 tablet (10 mg total) daily for 10 days by mouth. 12/21/16 12/31/16  Will Bonnet, MD    Review of Systems  Constitutional: Negative.   Eyes: Negative.   Respiratory: Negative.   Cardiovascular: Negative.   Gastrointestinal: Negative.   Genitourinary: Negative.   Musculoskeletal: Negative.   Skin: Negative.   Neurological: Positive for headaches.     Physical Exam BP 118/74   Ht 5' 5"  (1.651  m)   Wt (!) 308 lb (139.7 kg)   LMP 12/26/2016   BMI 51.25 kg/m  Patient's last menstrual period was 12/26/2016. Physical Exam  Constitutional: She is oriented to person, place, and time. She appears well-developed and well-nourished. No distress.  HENT:  Head: Normocephalic and atraumatic.  Eyes: Conjunctivae are normal. No scleral icterus.  Neurological: She is alert and oriented to person, place, and time. No cranial nerve deficit.  Psychiatric: She has a normal mood and affect. Her behavior is normal. Judgment normal.   Assessment: 31 y.o. [id number] female here for  1. History of prothrombin mutation   2. Menorrhagia with irregular cycle      Plan: Problem List Items Addressed This Visit    History of prothrombin mutation - Primary   Menorrhagia with irregular cycle   Relevant Medications   medroxyPROGESTERone (PROVERA) 10 MG tablet     10 minutes spent in face to face discussion with > 50% spent in counseling, management, and coordination of care of her menorrhagia with irregular cycles.   Prentice Docker, MD 01/17/2017 9:28 AM

## 2017-01-19 ENCOUNTER — Encounter: Payer: Self-pay | Admitting: Family Medicine

## 2017-01-27 ENCOUNTER — Other Ambulatory Visit: Payer: Self-pay

## 2017-01-27 ENCOUNTER — Emergency Department
Admission: EM | Admit: 2017-01-27 | Discharge: 2017-01-27 | Disposition: A | Discharge status: Home / Self Care | Payer: BLUE CROSS/BLUE SHIELD | Attending: Student in an Organized Health Care Education/Training Program | Admitting: Student in an Organized Health Care Education/Training Program

## 2017-01-27 ENCOUNTER — Emergency Department: Payer: BLUE CROSS/BLUE SHIELD

## 2017-01-27 ENCOUNTER — Encounter: Payer: Self-pay | Admitting: Emergency Medicine

## 2017-01-27 DIAGNOSIS — N201 Calculus of ureter: Secondary | ICD-10-CM | POA: Insufficient documentation

## 2017-01-27 DIAGNOSIS — R1032 Left lower quadrant pain: Secondary | ICD-10-CM

## 2017-01-27 DIAGNOSIS — Z86718 Personal history of other venous thrombosis and embolism: Secondary | ICD-10-CM | POA: Insufficient documentation

## 2017-01-27 DIAGNOSIS — Z79899 Other long term (current) drug therapy: Secondary | ICD-10-CM | POA: Insufficient documentation

## 2017-01-27 DIAGNOSIS — N1330 Unspecified hydronephrosis: Secondary | ICD-10-CM | POA: Diagnosis not present

## 2017-01-27 DIAGNOSIS — N132 Hydronephrosis with renal and ureteral calculous obstruction: Secondary | ICD-10-CM | POA: Diagnosis not present

## 2017-01-27 LAB — URINALYSIS, COMPLETE (UACMP) WITH MICROSCOPIC
BACTERIA UA: NONE SEEN
Bilirubin Urine: NEGATIVE
GLUCOSE, UA: NEGATIVE mg/dL
KETONES UR: NEGATIVE mg/dL
NITRITE: NEGATIVE
PROTEIN: 100 mg/dL — AB
Specific Gravity, Urine: 1.023 (ref 1.005–1.030)
pH: 5 (ref 5.0–8.0)

## 2017-01-27 LAB — CBC WITH DIFFERENTIAL/PLATELET
BASOS ABS: 0 10*3/uL (ref 0–0.1)
Basophils Relative: 1 %
EOS PCT: 1 %
Eosinophils Absolute: 0.1 10*3/uL (ref 0–0.7)
HCT: 40.9 % (ref 35.0–47.0)
Hemoglobin: 13.4 g/dL (ref 12.0–16.0)
Lymphocytes Relative: 18 %
Lymphs Abs: 1.4 10*3/uL (ref 1.0–3.6)
MCH: 24 pg — ABNORMAL LOW (ref 26.0–34.0)
MCHC: 32.7 g/dL (ref 32.0–36.0)
MCV: 73.6 fL — AB (ref 80.0–100.0)
MONO ABS: 0.4 10*3/uL (ref 0.2–0.9)
Monocytes Relative: 5 %
Neutro Abs: 5.9 10*3/uL (ref 1.4–6.5)
Neutrophils Relative %: 75 %
PLATELETS: 448 10*3/uL — AB (ref 150–440)
RBC: 5.56 MIL/uL — ABNORMAL HIGH (ref 3.80–5.20)
RDW: 15.9 % — AB (ref 11.5–14.5)
WBC: 7.9 10*3/uL (ref 3.6–11.0)

## 2017-01-27 LAB — BASIC METABOLIC PANEL
ANION GAP: 8 (ref 5–15)
BUN: 10 mg/dL (ref 6–20)
CALCIUM: 9.3 mg/dL (ref 8.9–10.3)
CO2: 23 mmol/L (ref 22–32)
CREATININE: 0.86 mg/dL (ref 0.44–1.00)
Chloride: 106 mmol/L (ref 101–111)
GFR calc Af Amer: >60 mL/min (ref >60)
GLUCOSE: 121 mg/dL — AB (ref 65–99)
Potassium: 3.8 mmol/L (ref 3.5–5.1)
Sodium: 137 mmol/L (ref 135–145)

## 2017-01-27 LAB — POCT PREGNANCY, URINE: Preg Test, Ur: NEGATIVE

## 2017-01-27 MED ORDER — OXYCODONE HCL 5 MG PO TABS
10.0000 mg | ORAL_TABLET | ORAL | Status: DC | PRN
Start: 2017-01-27 — End: 2017-01-27
  Administered 2017-01-27: 10 mg via ORAL
  Filled 2017-01-27: qty 2

## 2017-01-27 MED ORDER — KETOROLAC TROMETHAMINE 30 MG/ML IJ SOLN
INTRAMUSCULAR | Status: AC
  Administered 2017-01-27: 15 mg via INTRAVENOUS
  Filled 2017-01-27: qty 1

## 2017-01-27 MED ORDER — KETOROLAC TROMETHAMINE 30 MG/ML IJ SOLN
15.0000 mg | Freq: Once | INTRAMUSCULAR | Status: AC
  Administered 2017-01-27: 15 mg via INTRAVENOUS

## 2017-01-27 MED ORDER — PROMETHAZINE HCL 12.5 MG PO TABS: 12.5000 mg | ORAL_TABLET | Freq: Four times a day (QID) | ORAL | 0 refills | Status: DC | PRN

## 2017-01-27 MED ORDER — TAMSULOSIN HCL 0.4 MG PO CAPS: 0.4000 mg | ORAL_CAPSULE | Freq: Every day | ORAL | 0 refills | Status: DC

## 2017-01-27 MED ORDER — SODIUM CHLORIDE 0.9 % IV BOLUS (SEPSIS)
1000.0000 mL | Freq: Once | INTRAVENOUS | Status: AC
  Administered 2017-01-27: 1000 mL via INTRAVENOUS

## 2017-01-27 MED ORDER — OXYCODONE HCL 5 MG PO TABS: 5.0000 mg | ORAL_TABLET | ORAL | 0 refills | Status: DC | PRN

## 2017-01-27 MED ORDER — MORPHINE SULFATE (PF) 4 MG/ML IV SOLN
4.0000 mg | INTRAVENOUS | Status: DC | PRN
  Administered 2017-01-27: 4 mg via INTRAVENOUS
  Filled 2017-01-27: qty 1

## 2017-01-27 MED ORDER — PROMETHAZINE HCL 25 MG/ML IJ SOLN
12.5000 mg | Freq: Four times a day (QID) | INTRAMUSCULAR | Status: DC | PRN
  Administered 2017-01-27: 12.5 mg via INTRAVENOUS
  Filled 2017-01-27: qty 1

## 2017-01-27 MED ORDER — KETOROLAC TROMETHAMINE 30 MG/ML IJ SOLN: 30.0000 mg | Freq: Once | INTRAMUSCULAR | Status: DC

## 2017-01-27 NOTE — ED Provider Notes (Signed)
[facility] Emergency Department Provider Note    First MD Initiated Contact with Patient 01/27/17 0730     (approximate)  I have reviewed the triage vital signs and the nursing notes.   HISTORY  Chief Complaint Abdominal Pain and Urinary Urgency    HPI Diana Sexton is a 31 y.o. female with no recent hospitalizations but a history of prothrombin mutation as well as chronic abdominal pain with a history of nephrolithiasis requiring intervention presents with chief complaint of moderate to severe left lower quadrant pain associated with urinary hesitancy and difficulty emptying her bladder with pain radiated up to her left flank.  Patient states that the pain is excruciating she cannot find a comfortable position.  Is unable to state whether this feels similar to previous kidney stone.  States this feels different than the pain that she presented in June.  She denies any fevers.  Does feel nauseated.  Denies any bowel movements.  No new medications.  Past Medical History:  Diagnosis Date  . Cellulitis   . DVT (deep venous thrombosis) ([place]) 2013  . Dysmenorrhea   . Family history of breast cancer 11/2013   BRCA/MyRisk neg  . History of prothrombin mutation   . Increased risk of breast cancer 11/2013   IBIS=27%  . Obesity    Family History  Problem Relation Age of Onset  . Cancer Mother        breast  . Diabetes Father   . Hypertension Father   . Heart disease Maternal Grandfather   . Dementia Paternal Grandmother    Past Surgical History:  Procedure Laterality Date  . CHOLECYSTECTOMY    . DILITATION & CURRETTAGE/HYSTROSCOPY WITH ESSURE    . INTRAUTERINE DEVICE (IUD) INSERTION    . LITHOTRIPSY    . TONSILLECTOMY AND ADENOIDECTOMY    . TUBAL LIGATION     Patient Active Problem List   Diagnosis Date Noted  . Menorrhagia with irregular cycle 10/13/2016  . Other irritable bowel syndrome 05/02/2016  . Urinary tract infection 01/21/2015  .  Anxiety 01/21/2015  . Nephrolithiasis 01/15/2015  . Abnormal radiologic findings on diagnostic imaging of renal pelvis, ureter, or bladder 01/15/2015  . Low HDL (under 40) 01/01/2015  . Elevated LDL cholesterol level 01/01/2015  . Insomnia 12/16/2014  . Morbid obesity ([place]) 12/11/2014  . Hematuria 12/11/2014  . Preventative health care 12/11/2014  . Low back pain 10/13/2014  . History of prothrombin mutation 10/13/2014      Prior to Admission medications   Medication Sig Start Date End Date Taking? Authorizing Provider  albuterol (PROVENTIL HFA;VENTOLIN HFA) 108 (90 Base) MCG/ACT inhaler Inhale 2 puffs into the lungs every 6 (six) hours as needed for wheezing or shortness of breath. 11/09/15   Volney American, PA-C  fluticasone [facility]) 50 MCG/ACT nasal spray Place 2 sprays into both nostrils daily. Patient not taking: Reported on 12/25/2016 11/09/15   Volney American, PA-C  HYDROcodone-acetaminophen (NORCO/VICODIN) 5-325 MG tablet hydrocodone 5 mg-acetaminophen 325 mg tablet    [provider]  ibuprofen (ADVIL,MOTRIN) 600 MG tablet ibuprofen 600 mg tablet    [provider]  medroxyPROGESTERone (PROVERA) 10 MG tablet Take 1 tablet (10 mg total) by mouth daily for 10 days. 01/17/17 01/27/17  Will Bonnet, MD  oxyCODONE (ROXICODONE) 5 MG immediate release tablet Take 1 tablet (5 mg total) by mouth every 4 (four) hours as needed. 01/27/17 01/27/18  Merlyn Lot, MD  promethazine (PHENERGAN) 12.5 MG tablet Take 1  tablet (12.5 mg total) by mouth every 6 (six) hours as needed for nausea or vomiting. 01/27/17   Merlyn Lot, MD  pseudoephedrine (SUDAFED) 30 MG tablet Take 30 mg every 4 (four) hours as needed by mouth for congestion.    [provider]  tamsulosin (FLOMAX) 0.4 MG CAPS capsule Take 1 capsule (0.4 mg total) by mouth daily after supper. 01/27/17   Merlyn Lot, MD  traZODone (DESYREL) 50 MG tablet Take 0.5-1 tablets (25-50  mg total) by mouth at bedtime as needed for sleep. 04/06/16   Volney American, PA-C    Allergies Penicillins    Social History Social History   Tobacco Use  . Smoking status: Never Smoker  . Smokeless tobacco: Never Used  Substance Use Topics  . Alcohol use: Yes    Comment: rare  . Drug use: No    Review of Systems Patient denies headaches, rhinorrhea, blurry vision, numbness, shortness of breath, chest pain, edema, cough, abdominal pain, nausea, vomiting, diarrhea, dysuria, fevers, rashes or hallucinations unless otherwise stated above in HPI. ____________________________________________   PHYSICAL EXAM:  VITAL SIGNS: Vitals:   01/27/17 0724 01/27/17 1000  BP: (!) 147/92 134/75  Pulse: 93   Resp: 18   Temp: 98.9 F (37.2 C)   SpO2: 98%     Constitutional: Alert and oriented. Tearful and uncomfortable appearing Eyes: Conjunctivae are normal.  Head: Atraumatic. Nose: No congestion/rhinnorhea. Mouth/Throat: Mucous membranes are moist.   Neck: No stridor. Painless ROM.  Cardiovascular: Normal rate, regular rhythm. Grossly normal heart sounds.  Good peripheral circulation. Respiratory: Normal respiratory effort.  No retractions. Lungs CTAB. Gastrointestinal: Soft, morbidly obese with llq ttp. No distention. No abdominal bruits. No CVA tenderness. Genitourinary:  Musculoskeletal: No lower extremity tenderness nor edema.  No joint effusions. Neurologic:  Normal speech and language. No gross focal neurologic deficits are appreciated. No facial droop Skin:  Skin is warm, dry and intact. No rash noted. Psychiatric: Mood and affect are normal. Speech and behavior are normal.  ____________________________________________   LABS (all labs ordered are listed, but only abnormal results are displayed)  Results for orders placed or performed during the hospital encounter of 01/27/17 (from the past 24 hour(s))  Urinalysis, Complete w Microscopic     Status: Abnormal    Collection Time: 01/27/17  7:40 AM  Result Value Ref Range   Color, Urine YELLOW (A) YELLOW   APPearance HAZY (A) CLEAR   Specific Gravity, Urine 1.023 1.005 - 1.030   pH 5.0 5.0 - 8.0   Glucose, UA NEGATIVE NEGATIVE mg/dL   Hgb urine dipstick MODERATE (A) NEGATIVE   Bilirubin Urine NEGATIVE NEGATIVE   Ketones, ur NEGATIVE NEGATIVE mg/dL   Protein, ur 100 (A) NEGATIVE mg/dL   Nitrite NEGATIVE NEGATIVE   Leukocytes, UA SMALL (A) NEGATIVE   RBC / HPF TOO NUMEROUS TO COUNT 0 - 5 RBC/hpf   WBC, UA 6-30 0 - 5 WBC/hpf   Bacteria, UA NONE SEEN NONE SEEN   Squamous Epithelial / LPF 6-30 (A) NONE SEEN   Mucus PRESENT    Budding Yeast PRESENT   CBC with Differential/Platelet     Status: Abnormal   Collection Time: 01/27/17  7:40 AM  Result Value Ref Range   WBC 7.9 3.6 - 11.0 K/uL   RBC 5.56 (H) 3.80 - 5.20 MIL/uL   Hemoglobin 13.4 12.0 - 16.0 g/dL   HCT 40.9 35.0 - 47.0 %   MCV 73.6 (L) 80.0 - 100.0 fL   MCH 24.0 (L)  26.0 - 34.0 pg   MCHC 32.7 32.0 - 36.0 g/dL   RDW 15.9 (H) 11.5 - 14.5 %   Platelets 448 (H) 150 - 440 K/uL   Neutrophils Relative % 75 %   Neutro Abs 5.9 1.4 - 6.5 K/uL   Lymphocytes Relative 18 %   Lymphs Abs 1.4 1.0 - 3.6 K/uL   Monocytes Relative 5 %   Monocytes Absolute 0.4 0.2 - 0.9 K/uL   Eosinophils Relative 1 %   Eosinophils Absolute 0.1 0 - 0.7 K/uL   Basophils Relative 1 %   Basophils Absolute 0.0 0 - 0.1 K/uL  Basic metabolic panel     Status: Abnormal   Collection Time: 01/27/17  7:40 AM  Result Value Ref Range   Sodium 137 135 - 145 mmol/L   Potassium 3.8 3.5 - 5.1 mmol/L   Chloride 106 101 - 111 mmol/L   CO2 23 22 - 32 mmol/L   Glucose, Bld 121 (H) 65 - 99 mg/dL   BUN 10 6 - 20 mg/dL   Creatinine, Ser 0.86 0.44 - 1.00 mg/dL   Calcium 9.3 8.9 - 10.3 mg/dL   GFR calc non Af Amer >60 >60 mL/min   GFR calc Af Amer >60 >60 mL/min   Anion gap 8 5 - 15  Pregnancy, urine POC     Status: None   Collection Time: 01/27/17  7:58 AM  Result Value Ref  Range   Preg Test, Ur NEGATIVE NEGATIVE   ____________________________________________   ____________________________________________  RADIOLOGY  I personally reviewed all radiographic images ordered to evaluate for the above acute complaints and reviewed radiology reports and findings.  These findings were personally discussed with the patient.  Please see medical record for radiology report.  ____________________________________________   PROCEDURES  Procedure(s) performed:  Procedures    Critical Care performed: no ____________________________________________   INITIAL IMPRESSION / ASSESSMENT AND PLAN / ED COURSE  Pertinent labs & imaging results that were available during my care of the patient were reviewed by me and considered in my medical decision making (see chart for details).  DDX: stone, uti, pyelo, ureteral spasm, torsion, diverticulitis, ectopic  Diana Sexton is a 31 y.o. who presents to the ED with left lower quadrant and left flank pain as described above.  Patient very uncomfortable appearing with a history of kidney stones.  She is afebrile and hemodynamically stable.  Patient given IV pain medication and IV fluids.  Blood work shows no evidence of leukocytosis.  There is blood on urine but no evidence of infection.  Renal function is normal.  No CT imaging ordered due to concern for stone and to exclude other intra-abdominal pathology given the extent of her pain.  CT imaging shows evidence of 8 mm stone with mild left hydronephrosis that is acute.  Patient did get some dramatic improvement with Toradol and IV pain medication.  She is transition to oral medications.  Spoke with Dr. Jeffie Pollock on-call for urology who agrees patient is appropriate for follow-up in clinic early this week.  Discussed signs and symptoms for which she should return immediately to the hospital.      ____________________________________________   FINAL CLINICAL IMPRESSION(S) / ED  DIAGNOSES  Final diagnoses:  Ureterolithiasis  Left lower quadrant pain  Hydronephrosis, unspecified hydronephrosis type      NEW MEDICATIONS STARTED DURING THIS VISIT:  This SmartLink is deprecated. Use AVSMEDLIST instead to display the medication list for a patient.   Note:  This document was  prepared using Systems analyst and may include unintentional dictation errors.    Merlyn Lot, MD 01/27/17 1049

## 2017-01-27 NOTE — ED Triage Notes (Addendum)
Pt arrived via POV from home reports pain on LLQ that started around 0400. Pt c/o nausea and vomiting.  Pt states she feels the urge to pee but is unable to urinate.   Pt has known hx of kidney stones.    Pt states she had to have surgery to remove previous kidney stone.

## 2017-01-27 NOTE — Discharge Instructions (Signed)
Return immediately for fevers or if unable to control pain with oral medications.

## 2017-01-29 ENCOUNTER — Other Ambulatory Visit: Payer: Self-pay | Admitting: Radiology

## 2017-01-29 ENCOUNTER — Ambulatory Visit: Payer: BLUE CROSS/BLUE SHIELD | Admitting: Urology

## 2017-01-29 ENCOUNTER — Encounter: Payer: Self-pay | Admitting: Urology

## 2017-01-29 VITALS — BP 124/84 | HR 80 | Ht 65.0 in | Wt 315.1 lb

## 2017-01-29 DIAGNOSIS — N200 Calculus of kidney: Secondary | ICD-10-CM

## 2017-01-29 DIAGNOSIS — N201 Calculus of ureter: Secondary | ICD-10-CM | POA: Diagnosis not present

## 2017-01-29 LAB — URINALYSIS, COMPLETE
BILIRUBIN UA: NEGATIVE
Glucose, UA: NEGATIVE
KETONES UA: NEGATIVE
LEUKOCYTES UA: NEGATIVE
Nitrite, UA: NEGATIVE
Urobilinogen, Ur: 0.2 mg/dL (ref 0.2–1.0)
pH, UA: 5.5 (ref 5.0–7.5)

## 2017-01-29 LAB — MICROSCOPIC EXAMINATION
EPITHELIAL CELLS (NON RENAL): NONE SEEN /HPF (ref 0–10)
WBC UA: NONE SEEN /HPF (ref 0–?)

## 2017-01-29 NOTE — Progress Notes (Signed)
01/29/2017 11:03 AM   Diana Sexton [birth year] [id number]  Referring provider: Kathrine Haddock, NP [address], [place] [address]  No chief complaint on file.   HPI: 31 yo WF developed left flank pain over past month. About 2 days ago she developed LLQ pain and urgency. CT scan January 27, 2017 revealed an 8 mm left ureterovesical junction stone (ssd 15 cm), moderate hydroureteronephrosis with several stones in the kidney including a 4 mm left upper pole, 6 mm left midpole and 10 mm left pelvic stone.  UA showed too numerous to count red cells, 6-30 white cells, no bacteria.  White count was 7.9, Creatinine 0.86. She has not seen a stone pass. Pain has improved but continued urgency. No fever. She is staying hydrated. UA with a few bacteria.   She has a h/o stones and underwent left URS/stent in 2015.   PMH: Past Medical History:  Diagnosis Date  . Cellulitis   . DVT (deep venous thrombosis) ([place]) 2013  . Dysmenorrhea   . Family history of breast cancer 11/2013   BRCA/MyRisk neg  . History of prothrombin mutation   . Increased risk of breast cancer 11/2013   IBIS=27%  . Obesity     Surgical History: Past Surgical History:  Procedure Laterality Date  . CHOLECYSTECTOMY    . DILITATION & CURRETTAGE/HYSTROSCOPY WITH ESSURE    . INTRAUTERINE DEVICE (IUD) INSERTION    . LITHOTRIPSY    . TONSILLECTOMY AND ADENOIDECTOMY    . TUBAL LIGATION      Home Medications:  Allergies as of 01/29/2017      Reactions   Penicillins Rash      Medication List        Accurate as of 01/29/17 11:03 AM. Always use your most recent med list.          albuterol 108 (90 Base) MCG/ACT inhaler Commonly known as:  PROVENTIL HFA;VENTOLIN HFA Inhale 2 puffs into the lungs every 6 (six) hours as needed for wheezing or shortness of breath.   fluticasone 50 MCG/ACT nasal spray Commonly known as:  FLONASE Place 2 sprays into both nostrils daily.   HYDROcodone-acetaminophen 5-325 MG  tablet Commonly known as:  NORCO/VICODIN hydrocodone 5 mg-acetaminophen 325 mg tablet   ibuprofen 600 MG tablet Commonly known as:  ADVIL,MOTRIN ibuprofen 600 mg tablet   medroxyPROGESTERone 10 MG tablet Commonly known as:  PROVERA Take 1 tablet (10 mg total) by mouth daily for 10 days.   oxyCODONE 5 MG immediate release tablet Commonly known as:  ROXICODONE Take 1 tablet (5 mg total) by mouth every 4 (four) hours as needed.   promethazine 12.5 MG tablet Commonly known as:  PHENERGAN Take 1 tablet (12.5 mg total) by mouth every 6 (six) hours as needed for nausea or vomiting.   pseudoephedrine 30 MG tablet Commonly known as:  SUDAFED Take 30 mg every 4 (four) hours as needed by mouth for congestion.   tamsulosin 0.4 MG Caps capsule Commonly known as:  FLOMAX Take 1 capsule (0.4 mg total) by mouth daily after supper.   traZODone 50 MG tablet Commonly known as:  DESYREL Take 0.5-1 tablets (25-50 mg total) by mouth at bedtime as needed for sleep.       Allergies:  Allergies  Allergen Reactions  . Penicillins Rash    Family History: Family History  Problem Relation Age of Onset  . Cancer Mother        breast  . Diabetes Father   . Hypertension  Father   . Heart disease Maternal Grandfather   . Dementia Paternal Grandmother     Social History:  reports that  has never smoked. she has never used smokeless tobacco. She reports that she drinks alcohol. She reports that she does not use drugs.  ROS:                                        Physical Exam: There were no vitals taken for this visit.  Constitutional:  Alert and oriented, No acute distress. HEENT: [place] AT, moist mucus membranes.  Trachea midline, no masses. Cardiovascular: No clubbing, cyanosis, or edema. Respiratory: Normal respiratory effort, no increased work of breathing. GI: Abdomen is soft, nontender, nondistended, no abdominal masses GU: No CVA tenderness. Skin: No rashes,  bruises or suspicious lesions. Neurologic: Grossly intact, no focal deficits, moving all 4 extremities. Psychiatric: Normal mood and affect.  Laboratory Data: Lab Results  Component Value Date   WBC 7.9 01/27/2017   HGB 13.4 01/27/2017   HCT 40.9 01/27/2017   MCV 73.6 (L) 01/27/2017   PLT 448 (H) 01/27/2017    Lab Results  Component Value Date   CREATININE 0.86 01/27/2017    No results found for: PSA1  No results found for: TESTOSTERONE  No results found for: HGBA1C  Urinalysis Lab Results  Component Value Date   SPECGRAV 1.025 05/02/2016   PHUR 5.5 05/02/2016   COLORU Yellow 05/02/2016   APPEARANCEUR HAZY (A) 01/27/2017   LEUKOCYTESUR SMALL (A) 01/27/2017   PROTEINUR 100 (A) 01/27/2017   GLUCOSEU NEGATIVE 01/27/2017   KETONESU Negative 05/02/2016   RBCU TOO NUMEROUS TO COUNT 01/27/2017   BILIRUBINUR NEGATIVE 01/27/2017   UUROB 0.2 05/02/2016   NITRITE NEGATIVE 01/27/2017    Lab Results  Component Value Date   LABMICR See below: 05/02/2016   WBCUA 5-10 (A) 05/02/2016   RBCUA 3-10 (A) 05/02/2016   LABEPIT 0-10 05/02/2016   MUCUS Present 12/21/2014   BACTERIA NONE SEEN 01/27/2017    Pertinent Imaging: CT No results found for this or any previous visit. No results found for this or any previous visit. No results found for this or any previous visit. No results found for this or any previous visit. Results for orders placed during the hospital encounter of 01/19/15  US Renal   Narrative CLINICAL DATA:  Left flank pain  EXAM: RENAL / URINARY TRACT ULTRASOUND COMPLETE  COMPARISON:  December 25, 2014 CT abdomen and pelvis  FINDINGS: Right Kidney:  Length: 13.8 cm. Echogenicity and renal cortical thickness are within normal limits. No mass, perinephric fluid, or hydronephrosis visualized. There is a 4 mm calculus in the lower pole the right kidney. No ureterectasis.  Left Kidney:  Length: 14.6 cm. Echogenicity and renal cortical thickness  are within normal limits. No mass, perinephric fluid, or hydronephrosis visualized. There is a 7 mm calculus with a nearby 6 mm calculus in the mid to lower pole left kidney regions. No ureterectasis.  Bladder:  Appears normal for degree of bladder distention.  IMPRESSION: Nonobstructing calculi in each kidney. No hydronephrosis on either side. Study otherwise unremarkable. Note the kidneys are borderline prominent in size bilaterally with normal contours and normal echogenicity bilaterally.   Electronically Signed   By: Lowella Grip III M.D.   On: 01/19/2015 14:44    No results found for this or any previous visit. No results found for this or  any previous visit. Results for orders placed during the hospital encounter of 01/27/17  CT Renal Stone Study   Narrative CLINICAL DATA:  Left-sided abdomen and pelvic pain since 4 a.m. History of kidney stones.  EXAM: CT ABDOMEN AND PELVIS WITHOUT CONTRAST  TECHNIQUE: Multidetector CT imaging of the abdomen and pelvis was performed following the standard protocol without IV contrast.  COMPARISON:  08/05/2016  FINDINGS: Lower chest: No acute abnormality.  Hepatobiliary: Postcholecystectomy. Liver is unremarkable in appearance.  Pancreas: Unremarkable  Spleen: Unremarkable  Adrenals/Urinary Tract: Moderate left hydronephrosis is associated with an 8 mm left ureterovesical junction calculus. Bilateral nephrolithiasis is again demonstrated. The largest calculus in the left kidney measures 12 mm in the lower pole. Largest calculus within the right renal collecting system is 8 mm in the upper pole. No evidence of right ureteral calculus. Adrenal glands are unremarkable. Bladder is decompressed.  Stomach/Bowel: No evidence of disproportionate dilatation of bowel. No obvious mass in the colon. Appendix is not clearly visualized. Stomach and duodenum are decompressed.  Vascular/Lymphatic: No evidence of aortic aneurysm.  Small bowel mesenteric lymph nodes are stable.  Reproductive: Uterus and adnexa are within normal limits for age.  Other: No free-fluid.  Musculoskeletal: No vertebral compression deformity.  IMPRESSION: 8 mm left ureterovesical junction calculus is associated with left hydronephrosis, a secondary finding of ureteral obstruction.  Bilateral nephrolithiasis.   Electronically Signed   By: Marybelle Killings M.D.   On: 01/27/2017 09:06     Assessment & Plan:    1) left renal and ureteral stone - discussed nature risks and benefits to continued stone passage, URS and ESWL. All questions answered. She's not an ideal candidate for ESWL given increased skin to stone distance and URS could take care of the left renal stones although she may need a staged procedure. Urine sent for culture. Discussed she may pass the stone and she may need a staged procedure.   There are no diagnoses linked to this encounter.  No Follow-up on file.  Festus Aloe, MD  [facility] [address] [place], [place] [address] [phone]

## 2017-01-29 NOTE — H&P (View-Only) (Signed)
 01/29/2017 11:03 AM   Diana Sexton 10/11/1985 [id number]  Referring provider: Wicker, Cheryl, NP [address], [place] [address]  No chief complaint on file.   HPI: 31 yo WF developed left flank pain over past month. About 2 days ago she developed LLQ pain and urgency. CT scan January 27, 2017 revealed an 8 mm left ureterovesical junction stone (ssd 15 cm), moderate hydroureteronephrosis with several stones in the kidney including a 4 mm left upper pole, 6 mm left midpole and 10 mm left pelvic stone.  UA showed too numerous to count red cells, 6-30 white cells, no bacteria.  White count was 7.9, Creatinine 0.86. She has not seen a stone pass. Pain has improved but continued urgency. No fever. She is staying hydrated. UA with a few bacteria.   She has a h/o stones and underwent left URS/stent in 2015.   PMH: Past Medical History:  Diagnosis Date  . Cellulitis   . DVT (deep venous thrombosis) (HCC) 2013  . Dysmenorrhea   . Family history of breast cancer 11/2013   BRCA/MyRisk neg  . History of prothrombin mutation   . Increased risk of breast cancer 11/2013   IBIS=27%  . Obesity     Surgical History: Past Surgical History:  Procedure Laterality Date  . CHOLECYSTECTOMY    . DILITATION & CURRETTAGE/HYSTROSCOPY WITH ESSURE    . INTRAUTERINE DEVICE (IUD) INSERTION    . LITHOTRIPSY    . TONSILLECTOMY AND ADENOIDECTOMY    . TUBAL LIGATION      Home Medications:  Allergies as of 01/29/2017      Reactions   Penicillins Rash      Medication List        Accurate as of 01/29/17 11:03 AM. Always use your most recent med list.          albuterol 108 (90 Base) MCG/ACT inhaler Commonly known as:  PROVENTIL HFA;VENTOLIN HFA Inhale 2 puffs into the lungs every 6 (six) hours as needed for wheezing or shortness of breath.   fluticasone 50 MCG/ACT nasal spray Commonly known as:  FLONASE Place 2 sprays into both nostrils daily.   HYDROcodone-acetaminophen 5-325 MG  tablet Commonly known as:  NORCO/VICODIN hydrocodone 5 mg-acetaminophen 325 mg tablet   ibuprofen 600 MG tablet Commonly known as:  ADVIL,MOTRIN ibuprofen 600 mg tablet   medroxyPROGESTERone 10 MG tablet Commonly known as:  PROVERA Take 1 tablet (10 mg total) by mouth daily for 10 days.   oxyCODONE 5 MG immediate release tablet Commonly known as:  ROXICODONE Take 1 tablet (5 mg total) by mouth every 4 (four) hours as needed.   promethazine 12.5 MG tablet Commonly known as:  PHENERGAN Take 1 tablet (12.5 mg total) by mouth every 6 (six) hours as needed for nausea or vomiting.   pseudoephedrine 30 MG tablet Commonly known as:  SUDAFED Take 30 mg every 4 (four) hours as needed by mouth for congestion.   tamsulosin 0.4 MG Caps capsule Commonly known as:  FLOMAX Take 1 capsule (0.4 mg total) by mouth daily after supper.   traZODone 50 MG tablet Commonly known as:  DESYREL Take 0.5-1 tablets (25-50 mg total) by mouth at bedtime as needed for sleep.       Allergies:  Allergies  Allergen Reactions  . Penicillins Rash    Family History: Family History  Problem Relation Age of Onset  . Cancer Mother        breast  . Diabetes Father   . Hypertension   Father   . Heart disease Maternal Grandfather   . Dementia Paternal Grandmother     Social History:  reports that  has never smoked. she has never used smokeless tobacco. She reports that she drinks alcohol. She reports that she does not use drugs.  ROS:                                        Physical Exam: There were no vitals taken for this visit.  Constitutional:  Alert and oriented, No acute distress. HEENT: [place] AT, moist mucus membranes.  Trachea midline, no masses. Cardiovascular: No clubbing, cyanosis, or edema. Respiratory: Normal respiratory effort, no increased work of breathing. GI: Abdomen is soft, nontender, nondistended, no abdominal masses GU: No CVA tenderness. Skin: No rashes,  bruises or suspicious lesions. Neurologic: Grossly intact, no focal deficits, moving all 4 extremities. Psychiatric: Normal mood and affect.  Laboratory Data: Lab Results  Component Value Date   WBC 7.9 01/27/2017   HGB 13.4 01/27/2017   HCT 40.9 01/27/2017   MCV 73.6 (L) 01/27/2017   PLT 448 (H) 01/27/2017    Lab Results  Component Value Date   CREATININE 0.86 01/27/2017    No results found for: PSA1  No results found for: TESTOSTERONE  No results found for: HGBA1C  Urinalysis Lab Results  Component Value Date   SPECGRAV 1.025 05/02/2016   PHUR 5.5 05/02/2016   COLORU Yellow 05/02/2016   APPEARANCEUR HAZY (A) 01/27/2017   LEUKOCYTESUR SMALL (A) 01/27/2017   PROTEINUR 100 (A) 01/27/2017   GLUCOSEU NEGATIVE 01/27/2017   KETONESU Negative 05/02/2016   RBCU TOO NUMEROUS TO COUNT 01/27/2017   BILIRUBINUR NEGATIVE 01/27/2017   UUROB 0.2 05/02/2016   NITRITE NEGATIVE 01/27/2017    Lab Results  Component Value Date   LABMICR See below: 05/02/2016   WBCUA 5-10 (A) 05/02/2016   RBCUA 3-10 (A) 05/02/2016   LABEPIT 0-10 05/02/2016   MUCUS Present 12/21/2014   BACTERIA NONE SEEN 01/27/2017    Pertinent Imaging: CT No results found for this or any previous visit. No results found for this or any previous visit. No results found for this or any previous visit. No results found for this or any previous visit. Results for orders placed during the hospital encounter of 01/19/15  US Renal   Narrative CLINICAL DATA:  Left flank pain  EXAM: RENAL / URINARY TRACT ULTRASOUND COMPLETE  COMPARISON:  December 25, 2014 CT abdomen and pelvis  FINDINGS: Right Kidney:  Length: 13.8 cm. Echogenicity and renal cortical thickness are within normal limits. No mass, perinephric fluid, or hydronephrosis visualized. There is a 4 mm calculus in the lower pole the right kidney. No ureterectasis.  Left Kidney:  Length: 14.6 cm. Echogenicity and renal cortical thickness  are within normal limits. No mass, perinephric fluid, or hydronephrosis visualized. There is a 7 mm calculus with a nearby 6 mm calculus in the mid to lower pole left kidney regions. No ureterectasis.  Bladder:  Appears normal for degree of bladder distention.  IMPRESSION: Nonobstructing calculi in each kidney. No hydronephrosis on either side. Study otherwise unremarkable. Note the kidneys are borderline prominent in size bilaterally with normal contours and normal echogenicity bilaterally.   Electronically Signed   By: William  Woodruff III M.D.   On: 01/19/2015 14:44    No results found for this or any previous visit. No results found for this or   any previous visit. Results for orders placed during the hospital encounter of 01/27/17  CT Renal Stone Study   Narrative CLINICAL DATA:  Left-sided abdomen and pelvic pain since 4 a.m. History of kidney stones.  EXAM: CT ABDOMEN AND PELVIS WITHOUT CONTRAST  TECHNIQUE: Multidetector CT imaging of the abdomen and pelvis was performed following the standard protocol without IV contrast.  COMPARISON:  08/05/2016  FINDINGS: Lower chest: No acute abnormality.  Hepatobiliary: Postcholecystectomy. Liver is unremarkable in appearance.  Pancreas: Unremarkable  Spleen: Unremarkable  Adrenals/Urinary Tract: Moderate left hydronephrosis is associated with an 8 mm left ureterovesical junction calculus. Bilateral nephrolithiasis is again demonstrated. The largest calculus in the left kidney measures 12 mm in the lower pole. Largest calculus within the right renal collecting system is 8 mm in the upper pole. No evidence of right ureteral calculus. Adrenal glands are unremarkable. Bladder is decompressed.  Stomach/Bowel: No evidence of disproportionate dilatation of bowel. No obvious mass in the colon. Appendix is not clearly visualized. Stomach and duodenum are decompressed.  Vascular/Lymphatic: No evidence of aortic aneurysm.  Small bowel mesenteric lymph nodes are stable.  Reproductive: Uterus and adnexa are within normal limits for age.  Other: No free-fluid.  Musculoskeletal: No vertebral compression deformity.  IMPRESSION: 8 mm left ureterovesical junction calculus is associated with left hydronephrosis, a secondary finding of ureteral obstruction.  Bilateral nephrolithiasis.   Electronically Signed   By: Arthur  Hoss M.D.   On: 01/27/2017 09:06     Assessment & Plan:    1) left renal and ureteral stone - discussed nature risks and benefits to continued stone passage, URS and ESWL. All questions answered. She's not an ideal candidate for ESWL given increased skin to stone distance and URS could take care of the left renal stones although she may need a staged procedure. Urine sent for culture. Discussed she may pass the stone and she may need a staged procedure.   There are no diagnoses linked to this encounter.  No Follow-up on file.  Fallon Howerter, MD  [facility] [address], [address] [place], [place] [address] [phone]   

## 2017-01-31 ENCOUNTER — Encounter
Admission: RE | Admit: 2017-01-31 | Discharge: 2017-01-31 | Disposition: A | Discharge status: Home / Self Care | Payer: BLUE CROSS/BLUE SHIELD | Source: Ambulatory Visit | Attending: Urology | Admitting: Urology

## 2017-01-31 ENCOUNTER — Other Ambulatory Visit: Payer: Self-pay

## 2017-01-31 HISTORY — DX: Anxiety disorder, unspecified: F41.9

## 2017-01-31 HISTORY — DX: Personal history of urinary calculi: Z87.442

## 2017-01-31 HISTORY — DX: Headache, unspecified: R51.9

## 2017-01-31 HISTORY — DX: Anemia, unspecified: D64.9

## 2017-01-31 HISTORY — DX: Headache: R51

## 2017-01-31 NOTE — Patient Instructions (Signed)
  Your procedure is scheduled [id number] Dec. 21th , 2018. Report to Same Day Surgery. To find out your arrival time please call [phone] between 1PM - 3PM  tomorrow.  Remember: Instructions that are not followed completely may result in serious medical risk, up to and including death, or upon the discretion of your surgeon and anesthesiologist your surgery may need to be rescheduled.    _x___ 1. Do not eat food after midnight night prior to surgery.     No gum chewing or hard candies, snacks or breakfast.     May drink the following up until 2 hours before you arrive at the hospital:      water      Gatorade     clear apple juice      black coffee      or black tea      __x__ 2. No Alcohol for 24 hours before or after surgery.   ____ 3. Bring all medications with you on the day of surgery if instructed.    __x__ 4. Notify your doctor if there is any change in your medical condition     (cold, fever, infections).    _____ 5.   Do Not Smoke or use e-cigarettes For 24 Hours Prior to Your   Surgery.  Do not use any chewable tobacco products for at least 6   hours prior to  surgery.                  Do not wear jewelry, make-up, hairpins, clips or nail polish.  Do not wear lotions, powders, or perfumes.   Do not shave 48 hours prior to surgery. Men may shave face and neck.  Do not bring valuables to the hospital.    [facility] is not responsible for any belongings or valuables.               Contacts, dentures or bridgework may not be worn into surgery.  Leave your suitcase in the car. After surgery it may be brought to your room.  For patients admitted to the hospital, discharge time is determined by your  treatment team.   Patients discharged the day of surgery will not be allowed to drive home.    Please read over the following fact sheets that you were given:   [facility] Preparing for Surgery             __x__ Take these medicines the morning of surgery  with A SIP OF WATER:    1. oxyCODONE (ROXICODONE) optional ( no food with it)       ____ Fleet Enema (as directed)   ____ Use CHG Soap as directed on instruction sheet  ____ Use inhalers on the day of surgery and bring to hospital day of surgery  ____ Stop metformin 2 days prior to surgery    ____ Take 1/2 of usual insulin dose the night before surgery and none on the morning of          surgery.   ____ Stop Eliquis/Coumadin/Plavix/aspirin on does not apply  _x_ Stop Anti-inflammatories such as Advil, Aleve, Ibuprofen, Motrin, Naproxen,  Naprosyn, Goodies powders or aspirin products. OK to take Tylenol or oxycodone.   ____ Stop supplements until after surgery.    ____ Bring C-Pap to the hospital.

## 2017-02-01 LAB — CULTURE, URINE COMPREHENSIVE

## 2017-02-01 MED ORDER — CIPROFLOXACIN IN D5W 400 MG/200ML IV SOLN
400.0000 mg | INTRAVENOUS | Status: AC
  Administered 2017-02-02: 400 mg via INTRAVENOUS

## 2017-02-02 ENCOUNTER — Encounter
Admission: RE | Disposition: A | Discharge status: Home / Self Care | Payer: Self-pay | Source: Ambulatory Visit | Attending: Urology

## 2017-02-02 ENCOUNTER — Ambulatory Visit: Payer: BLUE CROSS/BLUE SHIELD | Admitting: Anesthesiology

## 2017-02-02 ENCOUNTER — Other Ambulatory Visit: Payer: Self-pay | Admitting: Urology

## 2017-02-02 ENCOUNTER — Other Ambulatory Visit: Payer: Self-pay | Admitting: Radiology

## 2017-02-02 ENCOUNTER — Ambulatory Visit
Admission: RE | Admit: 2017-02-02 | Discharge: 2017-02-02 | Disposition: A | Discharge status: Home / Self Care | Payer: BLUE CROSS/BLUE SHIELD | Source: Ambulatory Visit | Attending: Urology | Admitting: Urology

## 2017-02-02 ENCOUNTER — Encounter: Payer: Self-pay | Admitting: Anesthesiology

## 2017-02-02 DIAGNOSIS — N200 Calculus of kidney: Secondary | ICD-10-CM

## 2017-02-02 DIAGNOSIS — Z87442 Personal history of urinary calculi: Secondary | ICD-10-CM | POA: Insufficient documentation

## 2017-02-02 DIAGNOSIS — Z793 Long term (current) use of hormonal contraceptives: Secondary | ICD-10-CM | POA: Insufficient documentation

## 2017-02-02 DIAGNOSIS — F419 Anxiety disorder, unspecified: Secondary | ICD-10-CM | POA: Diagnosis not present

## 2017-02-02 DIAGNOSIS — Z6841 Body Mass Index (BMI) 40.0 and over, adult: Secondary | ICD-10-CM | POA: Diagnosis not present

## 2017-02-02 DIAGNOSIS — Z7951 Long term (current) use of inhaled steroids: Secondary | ICD-10-CM | POA: Insufficient documentation

## 2017-02-02 DIAGNOSIS — N202 Calculus of kidney with calculus of ureter: Secondary | ICD-10-CM | POA: Insufficient documentation

## 2017-02-02 DIAGNOSIS — E7800 Pure hypercholesterolemia, unspecified: Secondary | ICD-10-CM | POA: Diagnosis not present

## 2017-02-02 DIAGNOSIS — Z86718 Personal history of other venous thrombosis and embolism: Secondary | ICD-10-CM | POA: Insufficient documentation

## 2017-02-02 DIAGNOSIS — Z79899 Other long term (current) drug therapy: Secondary | ICD-10-CM | POA: Diagnosis not present

## 2017-02-02 DIAGNOSIS — D6852 Prothrombin gene mutation: Secondary | ICD-10-CM | POA: Diagnosis not present

## 2017-02-02 DIAGNOSIS — N132 Hydronephrosis with renal and ureteral calculous obstruction: Secondary | ICD-10-CM | POA: Diagnosis not present

## 2017-02-02 DIAGNOSIS — N201 Calculus of ureter: Secondary | ICD-10-CM

## 2017-02-02 DIAGNOSIS — Z79891 Long term (current) use of opiate analgesic: Secondary | ICD-10-CM | POA: Diagnosis not present

## 2017-02-02 HISTORY — PX: CYSTOSCOPY/URETEROSCOPY/HOLMIUM LASER/STENT PLACEMENT: SHX6546

## 2017-02-02 HISTORY — PX: CYSTOSCOPY W/ RETROGRADES: SHX1426

## 2017-02-02 LAB — POCT PREGNANCY, URINE: Preg Test, Ur: NEGATIVE

## 2017-02-02 SURGERY — CYSTOSCOPY/URETEROSCOPY/HOLMIUM LASER/STENT PLACEMENT
Anesthesia: General | Site: Ureter | Laterality: Left | Wound class: Clean Contaminated

## 2017-02-02 MED ORDER — MIDAZOLAM HCL 2 MG/2ML IJ SOLN
INTRAMUSCULAR | Status: DC | PRN
  Administered 2017-02-02: 2 mg via INTRAVENOUS

## 2017-02-02 MED ORDER — LIDOCAINE HCL (CARDIAC) 20 MG/ML IV SOLN
INTRAVENOUS | Status: DC | PRN
  Administered 2017-02-02: 100 mg via INTRAVENOUS

## 2017-02-02 MED ORDER — EPHEDRINE SULFATE 50 MG/ML IJ SOLN
INTRAMUSCULAR | Status: AC
  Filled 2017-02-02: qty 1

## 2017-02-02 MED ORDER — ONDANSETRON HCL 4 MG/2ML IJ SOLN: 4.0000 mg | Freq: Once | INTRAMUSCULAR | Status: DC | PRN

## 2017-02-02 MED ORDER — FENTANYL CITRATE (PF) 100 MCG/2ML IJ SOLN
INTRAMUSCULAR | Status: AC
  Filled 2017-02-02: qty 2

## 2017-02-02 MED ORDER — OXYBUTYNIN CHLORIDE 5 MG PO TABS
ORAL_TABLET | ORAL | Status: AC
  Filled 2017-02-02: qty 1

## 2017-02-02 MED ORDER — SUCCINYLCHOLINE CHLORIDE 20 MG/ML IJ SOLN
INTRAMUSCULAR | Status: AC
  Filled 2017-02-02: qty 1

## 2017-02-02 MED ORDER — LACTATED RINGERS IV SOLN
INTRAVENOUS | Status: DC
  Administered 2017-02-02: 08:00:00 via INTRAVENOUS

## 2017-02-02 MED ORDER — FENTANYL CITRATE (PF) 100 MCG/2ML IJ SOLN
INTRAMUSCULAR | Status: DC | PRN
  Administered 2017-02-02 (×3): 50 ug via INTRAVENOUS

## 2017-02-02 MED ORDER — MIDAZOLAM HCL 2 MG/2ML IJ SOLN
INTRAMUSCULAR | Status: AC
  Filled 2017-02-02: qty 2

## 2017-02-02 MED ORDER — GLYCOPYRROLATE 0.2 MG/ML IJ SOLN
INTRAMUSCULAR | Status: AC
  Filled 2017-02-02: qty 1

## 2017-02-02 MED ORDER — OXYBUTYNIN CHLORIDE 5 MG PO TABS: ORAL_TABLET | ORAL | 0 refills | Status: DC

## 2017-02-02 MED ORDER — FENTANYL CITRATE (PF) 100 MCG/2ML IJ SOLN
25.0000 ug | INTRAMUSCULAR | Status: DC | PRN
  Administered 2017-02-02 (×4): 25 ug via INTRAVENOUS

## 2017-02-02 MED ORDER — FENTANYL CITRATE (PF) 100 MCG/2ML IJ SOLN
INTRAMUSCULAR | Status: AC
  Administered 2017-02-02: 25 ug via INTRAVENOUS
  Filled 2017-02-02: qty 2

## 2017-02-02 MED ORDER — OXYCODONE HCL 5 MG PO TABS
5.0000 mg | ORAL_TABLET | Freq: Once | ORAL | Status: AC
  Administered 2017-02-02: 5 mg via ORAL

## 2017-02-02 MED ORDER — ONDANSETRON HCL 4 MG/2ML IJ SOLN
INTRAMUSCULAR | Status: AC
  Filled 2017-02-02: qty 2

## 2017-02-02 MED ORDER — TAMSULOSIN HCL 0.4 MG PO CAPS: 0.4000 mg | ORAL_CAPSULE | Freq: Every day | ORAL | 0 refills | Status: DC

## 2017-02-02 MED ORDER — ROCURONIUM BROMIDE 50 MG/5ML IV SOLN
INTRAVENOUS | Status: AC
  Filled 2017-02-02: qty 1

## 2017-02-02 MED ORDER — PHENYLEPHRINE HCL 10 MG/ML IJ SOLN
INTRAMUSCULAR | Status: DC | PRN
  Administered 2017-02-02 (×2): 100 ug via INTRAVENOUS
  Administered 2017-02-02 (×4): 200 ug via INTRAVENOUS
  Administered 2017-02-02 (×3): 100 ug via INTRAVENOUS

## 2017-02-02 MED ORDER — FAMOTIDINE 20 MG PO TABS
ORAL_TABLET | ORAL | Status: AC
  Filled 2017-02-02: qty 1

## 2017-02-02 MED ORDER — OXYBUTYNIN CHLORIDE 5 MG PO TABS
5.0000 mg | ORAL_TABLET | Freq: Once | ORAL | Status: AC
  Administered 2017-02-02: 5 mg via ORAL

## 2017-02-02 MED ORDER — FAMOTIDINE 20 MG PO TABS
20.0000 mg | ORAL_TABLET | Freq: Once | ORAL | Status: AC
  Administered 2017-02-02: 20 mg via ORAL

## 2017-02-02 MED ORDER — DEXAMETHASONE SODIUM PHOSPHATE 10 MG/ML IJ SOLN
INTRAMUSCULAR | Status: DC | PRN
  Administered 2017-02-02: 10 mg via INTRAVENOUS

## 2017-02-02 MED ORDER — ONDANSETRON HCL 4 MG/2ML IJ SOLN
INTRAMUSCULAR | Status: DC | PRN
  Administered 2017-02-02: 4 mg via INTRAVENOUS

## 2017-02-02 MED ORDER — ACETAMINOPHEN 10 MG/ML IV SOLN
INTRAVENOUS | Status: AC
  Filled 2017-02-02: qty 100

## 2017-02-02 MED ORDER — DEXAMETHASONE SODIUM PHOSPHATE 10 MG/ML IJ SOLN
INTRAMUSCULAR | Status: AC
  Filled 2017-02-02: qty 1

## 2017-02-02 MED ORDER — PROPOFOL 10 MG/ML IV BOLUS
INTRAVENOUS | Status: AC
  Filled 2017-02-02: qty 40

## 2017-02-02 MED ORDER — OXYCODONE HCL 5 MG PO TABS: 5.0000 mg | ORAL_TABLET | ORAL | 0 refills | Status: DC | PRN

## 2017-02-02 MED ORDER — KETOROLAC TROMETHAMINE 30 MG/ML IJ SOLN
INTRAMUSCULAR | Status: AC
  Filled 2017-02-02: qty 1

## 2017-02-02 MED ORDER — KETOROLAC TROMETHAMINE 30 MG/ML IJ SOLN
30.0000 mg | Freq: Once | INTRAMUSCULAR | Status: AC
  Administered 2017-02-02: 30 mg via INTRAVENOUS

## 2017-02-02 MED ORDER — IOTHALAMATE MEGLUMINE 43 % IV SOLN
INTRAVENOUS | Status: DC | PRN
  Administered 2017-02-02: 15 mL via URETHRAL

## 2017-02-02 MED ORDER — PHENYLEPHRINE HCL 10 MG/ML IJ SOLN
INTRAMUSCULAR | Status: AC
  Filled 2017-02-02: qty 1

## 2017-02-02 MED ORDER — ROCURONIUM BROMIDE 100 MG/10ML IV SOLN
INTRAVENOUS | Status: DC | PRN
  Administered 2017-02-02: 10 mg via INTRAVENOUS
  Administered 2017-02-02: 50 mg via INTRAVENOUS
  Administered 2017-02-02: 10 mg via INTRAVENOUS

## 2017-02-02 MED ORDER — PROPOFOL 10 MG/ML IV BOLUS
INTRAVENOUS | Status: DC | PRN
  Administered 2017-02-02: 150 mg via INTRAVENOUS

## 2017-02-02 MED ORDER — SUGAMMADEX SODIUM 500 MG/5ML IV SOLN
INTRAVENOUS | Status: AC
  Filled 2017-02-02: qty 5

## 2017-02-02 MED ORDER — CIPROFLOXACIN IN D5W 400 MG/200ML IV SOLN
INTRAVENOUS | Status: AC
  Filled 2017-02-02: qty 200

## 2017-02-02 MED ORDER — LIDOCAINE HCL (PF) 2 % IJ SOLN
INTRAMUSCULAR | Status: AC
  Filled 2017-02-02: qty 10

## 2017-02-02 MED ORDER — OXYCODONE HCL 5 MG PO TABS
ORAL_TABLET | ORAL | Status: AC
  Filled 2017-02-02: qty 1

## 2017-02-02 MED ORDER — ACETAMINOPHEN 10 MG/ML IV SOLN
INTRAVENOUS | Status: DC | PRN
  Administered 2017-02-02: 1000 mg via INTRAVENOUS

## 2017-02-02 MED ORDER — SUGAMMADEX SODIUM 200 MG/2ML IV SOLN
INTRAVENOUS | Status: DC | PRN
  Administered 2017-02-02: 300 mg via INTRAVENOUS

## 2017-02-02 SURGICAL SUPPLY — 30 items
BAG DRAIN CYSTO-URO LG1000N (MISCELLANEOUS) ×3 IMPLANT
BASKET ZERO TIP 1.9FR (BASKET) ×6 IMPLANT
CATH URETL 5X70 OPEN END (CATHETERS) ×3 IMPLANT
CNTNR SPEC 2.5X3XGRAD LEK (MISCELLANEOUS) ×1
CONRAY 43 FOR UROLOGY 50M (MISCELLANEOUS) ×3 IMPLANT
CONT SPEC 4OZ STER OR WHT (MISCELLANEOUS) ×2
CONTAINER SPEC 2.5X3XGRAD LEK (MISCELLANEOUS) ×1 IMPLANT
DRAPE UTILITY 15X26 TOWEL STRL (DRAPES) ×3 IMPLANT
FIBER LASER LITHO 273 (Laser) ×3 IMPLANT
GLOVE BIOGEL M 8.0 STRL (GLOVE) ×3 IMPLANT
GOWN STANDARD XL  REUSABL (MISCELLANEOUS) ×3 IMPLANT
GUIDEWIRE GREEN .038 145CM (MISCELLANEOUS) IMPLANT
INFUSOR MANOMETER BAG 3000ML (MISCELLANEOUS) ×3 IMPLANT
INTRODUCER DILATOR DOUBLE (INTRODUCER) IMPLANT
KIT RM TURNOVER CYSTO AR (KITS) ×3 IMPLANT
PACK CYSTO AR (MISCELLANEOUS) ×3 IMPLANT
SENSORWIRE 0.038 NOT ANGLED (WIRE) ×6
SET CYSTO W/LG BORE CLAMP LF (SET/KITS/TRAYS/PACK) ×3 IMPLANT
SHEATH URETERAL 12FRX35CM (MISCELLANEOUS) ×3 IMPLANT
SHEATH URETERAL 13/15X36 1L (SHEATH) IMPLANT
SHEATH URETL 1L 13/15X28 (SHEATH) IMPLANT
SOL .9 NS 3000ML IRR  AL (IV SOLUTION) ×2
SOL .9 NS 3000ML IRR UROMATIC (IV SOLUTION) ×1 IMPLANT
STENT URET 6FRX24 CONTOUR (STENTS) ×3 IMPLANT
STENT URET 6FRX26 CONTOUR (STENTS) IMPLANT
SURGILUBE 2OZ TUBE FLIPTOP (MISCELLANEOUS) ×3 IMPLANT
SYR 10ML LL (SYRINGE) ×3 IMPLANT
WATER STERILE IRR 1000ML POUR (IV SOLUTION) ×3 IMPLANT
WATER STERILE IRR 3000ML UROMA (IV SOLUTION) ×3 IMPLANT
WIRE SENSOR 0.038 NOT ANGLED (WIRE) ×2 IMPLANT

## 2017-02-02 NOTE — Anesthesia Postprocedure Evaluation (Signed)
Anesthesia Post Note  Patient: Diana Sexton  Procedure(s) Performed: CYSTOSCOPY/URETEROSCOPY/HOLMIUM LASER/STENT PLACEMENT (Left Ureter) CYSTOSCOPY WITH RETROGRADE PYELOGRAM (Left Ureter)  Patient location during evaluation: PACU Anesthesia Type: General Level of consciousness: awake and alert and oriented Pain management: pain level controlled Vital Signs Assessment: post-procedure vital signs reviewed and stable Respiratory status: spontaneous breathing Cardiovascular status: blood pressure returned to baseline Anesthetic complications: no     Last Vitals:  Vitals:   02/02/17 1158 02/02/17 1305  BP: 114/75 134/67  Pulse: 75 83  Resp: 16   Temp: 37.1 C   SpO2: 96% 100%    Last Pain:  Vitals:   02/02/17 1305  TempSrc:   PainSc: 2                  Estill Llerena

## 2017-02-02 NOTE — Op Note (Signed)
Date of procedure: 02/02/17  Preoperative diagnosis:  1. Left distal ureteral calculus 2. Left nephrolithiasis  Postoperative diagnosis:  1. Left nephrolithiasis  Procedure: 1. Left uretero-pyeloscopy with laser lithotripsy/calculus removal 2. Placement left ureteral stent 3. Left retrograde pyelogram  Surgeon: John Giovanni, MD  Anesthesia: General  Complications: None  Intraoperative findings:  1.  Ureteroscopy: Left ureteral calculus no longer present.  10 mm UPJ stone present as well as upper and lower pole renal calculi. 2.  Left retrograde pyelogram-no significant hydronephrosis.  Calculi as described above identified as filling defects.  EBL: Minimal  Specimens: Stone fragments for analysis  Drains: 6 French/24 cm double-J stent  Indication: Diana Sexton is a 31 y.o. patient with CT scan January 27, 2017 revealing an 8 mm left ureterovesical junction stone (ssd 15 cm), moderate hydroureteronephrosis with several stones in the kidney including a 4 mm left upper pole, 6 mm left midpole and 10 mm left pelvic stone..  After reviewing the management options for treatment, he elected to proceed with the above surgical procedure(s). We have discussed the potential benefits and risks of the procedure, side effects of the proposed treatment, the likelihood of the patient achieving the goals of the procedure, and any potential problems that might occur during the procedure or recuperation. Informed consent has been obtained.  Description of procedure:  The patient was taken to the operating room and general anesthesia was induced.  The patient was placed in the dorsal lithotomy position, prepped and draped in the usual sterile fashion, and preoperative antibiotics were administered. A preoperative time-out was performed.   A 22 French cystoscope was lubricated and passed per urethra.  Panendoscopy was performed and the bladder mucosa was normal in appearance without erythema,  solid or papillary lesions.  The ureteral orifices were normal appearing bilaterally with clear efflux.  A 0.038 Sensor wire was placed through the cystoscope and into the left ureteral orifice and advanced into the left renal pelvis with position verified by fluoroscopy.  The cystoscope was removed and a semirigid ureteroscope was passed per urethra.  The left ureteral orifice was engaged without dilation.  No stone was identified in the distal ureter.  The ureteroscope was advanced proximally to the UPJ where a calculus was identified.  A second sensor wire was placed through the ureteroscope and the ureteroscope was removed.  A 12 French ureteral access sheath was placed over the second wire under fluoroscopic guidance without difficulty.  The inner stylette and guidewire were removed.  A flexible ureteroscope was then placed through the access sheath and advanced into the renal pelvis.  Retrograde pyelogram was performed.  All calyces were examined and the 10 mm renal pelvic calculus and upper and lower pole calyceal calculi were identified.  A 273 m holmium laser fiber was placed to the ureteroscope.  The 10 mm calculus was dusted at settings of 0.2 J / 40 Hz down to a fragment size which could be grasped with a 1.9 [place] nitinol basket and removed.  The upper pole ureteral calculus was basketed without difficulty and removed.  The lower pole calculus was fragmented into 3 pieces which were removed.  Repeat retrograde pyelogram performed and no extravasation was noted.  All calyces were examined and all fragments of significant size were basketed.  Fine dust like fragments were left passed spontaneously.  The ureteroscope and access sheath were removed in tandem and no ureteral abnormalities were identified.  A 6 French/24 cm double-J ureteral stent was placed over the  guidewire under fluoroscopic guidance.  Good curls were noted proximally and distally under fluoroscopy.  Plan: She will follow-up in  approximately 10 days for a KUB and cystoscopy with stent removal.  John Giovanni, M.D.

## 2017-02-02 NOTE — Discharge Instructions (Signed)
AMBULATORY SURGERY  DISCHARGE INSTRUCTIONS   1) The drugs that you were given will stay in your system until tomorrow so for the next 24 hours you should not:  A) Drive an automobile B) Make any legal decisions C) Drink any alcoholic beverage   2) You may resume regular meals tomorrow.  Today it is better to start with liquids and gradually work up to solid foods.  You may eat anything you prefer, but it is better to start with liquids, then soup and crackers, and gradually work up to solid foods.   3) Please notify your doctor immediately if you have any unusual bleeding, trouble breathing, redness and pain at the surgery site, drainage, fever, or pain not relieved by medication.    4) Additional Instructions: Stool Softener while on medications   Please contact your physician with any problems or Same Day Surgery at [phone], Monday through Friday 6 am to 4 pm, or [place] at [facility] [place] at [phone].

## 2017-02-02 NOTE — Transfer of Care (Signed)
Immediate Anesthesia Transfer of Care Note  Patient: Kristell B Feldner  Procedure(s) Performed: Procedure(s): CYSTOSCOPY/URETEROSCOPY/HOLMIUM LASER/STENT PLACEMENT (Left) CYSTOSCOPY WITH RETROGRADE PYELOGRAM (Left)  Patient Location: PACU  Anesthesia Type:General  Level of Consciousness: sedated  Airway & Oxygen Therapy: Patient Spontanous Breathing and Patient connected to face mask oxygen  Post-op Assessment: Report given to RN and Post -op Vital signs reviewed and stable  Post vital signs: Reviewed and stable  Last Vitals:  Vitals:   02/02/17 0828 02/02/17 1105  BP: (!) 116/59 (!) 101/59  Pulse: 80 85  Resp: 16 15  Temp: 36.5 C (!) 36.1 C  SpO2: 96% 75%    Complications: No apparent anesthesia complications

## 2017-02-02 NOTE — Anesthesia Post-op Follow-up Note (Signed)
Anesthesia QCDR form completed.        

## 2017-02-02 NOTE — Interval H&P Note (Signed)
History and Physical Interval Note:  02/02/2017 8:46 AM  Diana Sexton  has presented today for surgery, with the diagnosis of left ureteral stone, left renal stones  The various methods of treatment have been discussed with the patient and family. After consideration of risks, benefits and other options for treatment, the patient has consented to  Procedure(s): CYSTOSCOPY/URETEROSCOPY/HOLMIUM LASER/STENT PLACEMENT (Left) CYSTOSCOPY WITH RETROGRADE PYELOGRAM (Left) as a surgical intervention .  The patient's history has been reviewed, patient examined, no change in status, stable for surgery.  I have reviewed the patient's chart and labs.  Questions were answered to the patient's satisfaction.    [place]

## 2017-02-02 NOTE — Anesthesia Procedure Notes (Signed)
Procedure Name: Intubation Date/Time: 02/02/2017 9:02 AM Performed by: Doreen Salvage, CRNA Pre-anesthesia Checklist: Patient identified, Patient being monitored, Timeout performed, Emergency Drugs available and Suction available Patient Re-evaluated:Patient Re-evaluated prior to induction Oxygen Delivery Method: Circle system utilized Preoxygenation: Pre-oxygenation with 100% oxygen Induction Type: IV induction Ventilation: Mask ventilation without difficulty Laryngoscope Size: Mac and 3 Grade View: Grade I Tube type: Oral Tube size: 7.0 mm Number of attempts: 1 Airway Equipment and Method: Stylet Placement Confirmation: ETT inserted through vocal cords under direct vision,  positive ETCO2 and breath sounds checked- equal and bilateral Secured at: 21 cm Tube secured with: Tape Dental Injury: Teeth and Oropharynx as per pre-operative assessment

## 2017-02-02 NOTE — Anesthesia Preprocedure Evaluation (Addendum)
Anesthesia Evaluation  Patient identified by MRN, date of birth, ID band Patient awake    Reviewed: Allergy & Precautions, NPO status , Patient's Chart, lab work & pertinent test results  Airway Mallampati: III  TM Distance: >3 FB     Dental  (+) Teeth Intact   Pulmonary neg pulmonary ROS,    Pulmonary exam normal        Cardiovascular negative cardio ROS Normal cardiovascular exam     Neuro/Psych  Headaches, PSYCHIATRIC DISORDERS Anxiety    GI/Hepatic   Endo/Other  Morbid obesity  Renal/GU stones     Musculoskeletal   Abdominal Normal abdominal exam  (+)   Peds negative pediatric ROS (+)  Hematology  (+) anemia ,   Anesthesia Other Findings Past Medical History: No date: Anemia No date: Anxiety 2012: Cellulitis     Comment:  caused stillbirth 06/2011: DVT (deep venous thrombosis) ([place])     Comment:  under right clavicle No date: Dysmenorrhea 11/2013: Family history of breast cancer     Comment:  BRCA/MyRisk neg No date: Headache     Comment:  related to hormonal meds No date: History of kidney stones No date: History of prothrombin mutation 11/2013: Increased risk of breast cancer     Comment:  IBIS=27% No date: Obesity  Reproductive/Obstetrics                             Anesthesia Physical Anesthesia Plan  ASA: III  Anesthesia Plan: General   Post-op Pain Management:    Induction: Intravenous, Rapid sequence and Cricoid pressure planned  PONV Risk Score and Plan:   Airway Management Planned: Oral ETT  Additional Equipment:   Intra-op Plan:   Post-operative Plan: Extubation in OR  Informed Consent: I have reviewed the patients History and Physical, chart, labs and discussed the procedure including the risks, benefits and alternatives for the proposed anesthesia with the patient or authorized representative who has indicated his/her understanding and acceptance.    Dental advisory given  Plan Discussed with: CRNA and Surgeon  Anesthesia Plan Comments:         Anesthesia Quick Evaluation

## 2017-02-08 ENCOUNTER — Other Ambulatory Visit: Payer: Self-pay | Admitting: Radiology

## 2017-02-09 ENCOUNTER — Ambulatory Visit (INDEPENDENT_AMBULATORY_CARE_PROVIDER_SITE_OTHER): Payer: BLUE CROSS/BLUE SHIELD | Admitting: Urology

## 2017-02-09 ENCOUNTER — Ambulatory Visit
Admission: RE | Admit: 2017-02-09 | Discharge: 2017-02-09 | Disposition: A | Discharge status: Home / Self Care | Payer: BLUE CROSS/BLUE SHIELD | Source: Ambulatory Visit | Attending: Urology | Admitting: Urology

## 2017-02-09 ENCOUNTER — Encounter: Payer: Self-pay | Admitting: Urology

## 2017-02-09 VITALS — BP 132/87 | HR 113 | Ht 65.0 in | Wt 315.0 lb

## 2017-02-09 DIAGNOSIS — N200 Calculus of kidney: Secondary | ICD-10-CM

## 2017-02-09 DIAGNOSIS — N201 Calculus of ureter: Secondary | ICD-10-CM | POA: Diagnosis not present

## 2017-02-09 LAB — URINALYSIS, COMPLETE
BILIRUBIN UA: NEGATIVE
GLUCOSE, UA: NEGATIVE
Ketones, UA: NEGATIVE
Nitrite, UA: NEGATIVE
PH UA: 5.5 (ref 5.0–7.5)
Specific Gravity, UA: >1.03 — ABNORMAL HIGH (ref 1.005–1.030)
UUROB: 0.2 mg/dL (ref 0.2–1.0)

## 2017-02-09 LAB — MICROSCOPIC EXAMINATION: RBC, UA: >30 /hpf — ABNORMAL HIGH (ref 0–?)

## 2017-02-09 MED ORDER — LIDOCAINE HCL 2 % EX GEL
1.0000 "application " | Freq: Once | CUTANEOUS | Status: AC
  Administered 2017-02-09: 1 via URETHRAL

## 2017-02-09 MED ORDER — CIPROFLOXACIN HCL 500 MG PO TABS
500.0000 mg | ORAL_TABLET | Freq: Once | ORAL | Status: AC
  Administered 2017-02-09: 500 mg via ORAL

## 2017-02-09 NOTE — Progress Notes (Signed)
Indications: Patient is 31 y.o., female who recently underwent left ureteroscopic stone removal with remaining indwelling JJ ureteral stent.  The patient is presenting today for stent removal.  She has mild stent irritation.  KUB performed today was reviewed and the stent is in good position.  No stone fragments identified.  Procedure:  Flexible Cystoscopy with stent removal (40768)  Timeout was performed and the correct patient, procedure and participants were identified.    Description:  The patient was prepped and draped in the usual sterile fashion. Flexible cystosopy was performed.  The stent was visualized, grasped, and removed intact without difficulty. The patient tolerated the procedure well.  A single dose of oral antibiotics was given.  Complications:  None  Plan: She is scheduled for follow-up renal ultrasound next month.  She was instructed to call for severe flank pain post stent removal.

## 2017-02-14 LAB — STONE ANALYSIS
CA OXALATE, DIHYDRATE: 5 %
Ca Oxalate,Monohydr.: 90 %
Ca phos cry stone ql IR: 5 %
STONE WEIGHT KSTONE: 179.7 mg

## 2017-02-16 DIAGNOSIS — J019 Acute sinusitis, unspecified: Secondary | ICD-10-CM | POA: Diagnosis not present

## 2017-02-16 DIAGNOSIS — J028 Acute pharyngitis due to other specified organisms: Secondary | ICD-10-CM | POA: Diagnosis not present

## 2017-02-16 DIAGNOSIS — B373 Candidiasis of vulva and vagina: Secondary | ICD-10-CM | POA: Diagnosis not present

## 2017-02-16 DIAGNOSIS — J40 Bronchitis, not specified as acute or chronic: Secondary | ICD-10-CM | POA: Diagnosis not present

## 2017-02-27 ENCOUNTER — Ambulatory Visit
Admission: RE | Admit: 2017-02-27 | Discharge: 2017-02-27 | Disposition: A | Discharge status: Home / Self Care | Payer: BLUE CROSS/BLUE SHIELD | Source: Ambulatory Visit | Attending: Urology | Admitting: Urology

## 2017-02-27 DIAGNOSIS — N201 Calculus of ureter: Secondary | ICD-10-CM

## 2017-02-27 DIAGNOSIS — N200 Calculus of kidney: Secondary | ICD-10-CM | POA: Diagnosis not present

## 2017-03-02 ENCOUNTER — Encounter: Payer: Self-pay | Admitting: Urology

## 2017-03-02 ENCOUNTER — Ambulatory Visit: Payer: BLUE CROSS/BLUE SHIELD | Admitting: Urology

## 2017-03-02 VITALS — BP 133/87 | HR 92 | Ht 65.0 in | Wt 308.0 lb

## 2017-03-02 DIAGNOSIS — N200 Calculus of kidney: Secondary | ICD-10-CM | POA: Diagnosis not present

## 2017-03-02 NOTE — Progress Notes (Signed)
03/02/2017 2:52 PM   Diana Sexton Jul 05, 1985 [id number]  Referring provider: Kathrine Haddock, NP [address], [place] [address]  Chief Complaint  Patient presents with  . Follow-up    RUS    HPI: 32 year old female status post ureteroscopic removal of a 10 mm left renal pelvic calculus as well as 4 mm and 6 mm upper and midpole calyceal calculi on 02/02/2017.  Her stent was removed on 12/28 and KUB at that time showed minute stone fragments.  She presents today for review of a follow-up renal ultrasound.  She has no complaints.  She denies flank, abdominal or pelvic pain.  Her stone analysis was 90% calcium oxalate monohydrate/5% calcium oxalate dihydrate/5% calcium phosphate.  She states she had a 24-hour urine study in 2014 and was started on magnesium oxide.  She does not recall the exact abnormalities and the results are not available for review.   PMH: Past Medical History:  Diagnosis Date  . Anemia   . Anxiety   . Cellulitis 2012   caused stillbirth  . DVT (deep venous thrombosis) ([place]) 06/2011   under right clavicle  . Dysmenorrhea   . Family history of breast cancer 11/2013   BRCA/MyRisk neg  . Headache    related to hormonal meds  . History of kidney stones   . History of prothrombin mutation   . Increased risk of breast cancer 11/2013   IBIS=27%  . Obesity     Surgical History: Past Surgical History:  Procedure Laterality Date  . CHOLECYSTECTOMY    . CYSTOSCOPY W/ RETROGRADES Left 02/02/2017   Procedure: CYSTOSCOPY WITH RETROGRADE PYELOGRAM;  Surgeon: Abbie Sons, MD;  Location: [facility] ORS;  Service: Urology;  Laterality: Left;  . CYSTOSCOPY/URETEROSCOPY/HOLMIUM LASER/STENT PLACEMENT Left 02/02/2017   Procedure: CYSTOSCOPY/URETEROSCOPY/HOLMIUM LASER/STENT PLACEMENT;  Surgeon: Abbie Sons, MD;  Location: [facility] ORS;  Service: Urology;  Laterality: Left;  . DILITATION & CURRETTAGE/HYSTROSCOPY WITH ESSURE    . INTRAUTERINE DEVICE (IUD) INSERTION    .  LITHOTRIPSY    . TONSILLECTOMY    . TONSILLECTOMY AND ADENOIDECTOMY    . TUBAL LIGATION      Home Medications:  Allergies as of 03/02/2017      Reactions   Penicillins Rash   Has patient had a PCN reaction causing immediate rash, facial/tongue/throat swelling, SOB or lightheadedness with hypotension: Yes Has patient had a PCN reaction causing severe rash involving mucus membranes or skin necrosis: Yes Has patient had a PCN reaction that required hospitalization: No Has patient had a PCN reaction occurring within the last 10 years: Yes If all of the above answers are "NO", then may proceed with Cephalosporin use.      Medication List        Accurate as of 03/02/17  2:52 PM. Always use your most recent med list.          albuterol 108 (90 Base) MCG/ACT inhaler Commonly known as:  PROVENTIL HFA;VENTOLIN HFA Inhale 2 puffs into the lungs every 6 (six) hours as needed for wheezing or shortness of breath.   ibuprofen 600 MG tablet Commonly known as:  ADVIL,MOTRIN TAKE 600 MG TWICE DAILY AS NEEDED FOR PAIN   medroxyPROGESTERone 10 MG tablet Commonly known as:  PROVERA Take 1 tablet (10 mg total) by mouth daily for 10 days.   traZODone 50 MG tablet Commonly known as:  DESYREL Take 0.5-1 tablets (25-50 mg total) by mouth at bedtime as needed for sleep.  Allergies:  Allergies  Allergen Reactions  . Penicillins Rash    Has patient had a PCN reaction causing immediate rash, facial/tongue/throat swelling, SOB or lightheadedness with hypotension: Yes Has patient had a PCN reaction causing severe rash involving mucus membranes or skin necrosis: Yes Has patient had a PCN reaction that required hospitalization: No Has patient had a PCN reaction occurring within the last 10 years: Yes If all of the above answers are "NO", then may proceed with Cephalosporin use.     Family History: Family History  Problem Relation Age of Onset  . Cancer Mother        breast  . Diabetes  Father   . Hypertension Father   . Heart disease Maternal Grandfather   . Dementia Paternal Grandmother   . Bladder Cancer Neg Hx   . Kidney cancer Neg Hx     Social History:  reports that  has never smoked. she has never used smokeless tobacco. She reports that she drinks alcohol. She reports that she does not use drugs.  ROS: UROLOGY Frequent Urination?: No Hard to postpone urination?: No Burning/pain with urination?: No Get up at night to urinate?: No Leakage of urine?: No Urine stream starts and stops?: No Trouble starting stream?: No Do you have to strain to urinate?: No Blood in urine?: No Urinary tract infection?: No Sexually transmitted disease?: No Injury to kidneys or bladder?: No Painful intercourse?: No Weak stream?: No Currently pregnant?: No Vaginal bleeding?: Yes Last menstrual period?: 03/01/2017  Gastrointestinal Nausea?: No Vomiting?: No Indigestion/heartburn?: No Diarrhea?: No Constipation?: No  Constitutional Fever: No Night sweats?: No Weight loss?: No Fatigue?: No  Skin Skin rash/lesions?: No Itching?: No  Eyes Blurred vision?: No Double vision?: No  Ears/Nose/Throat Sore throat?: No Sinus problems?: No  Hematologic/Lymphatic Swollen glands?: No Easy bruising?: No  Cardiovascular Leg swelling?: No Chest pain?: No  Respiratory Cough?: No Shortness of breath?: No  Endocrine Excessive thirst?: No  Musculoskeletal Back pain?: No Joint pain?: No  Neurological Headaches?: No Dizziness?: No  Psychologic Depression?: No Anxiety?: No  Physical Exam: BP 133/87   Pulse 92   Ht 5' 5"  (1.651 m)   Wt (!) 308 lb (139.7 kg)   BMI 51.25 kg/m   Constitutional:  Alert and oriented, No acute distress. HEENT: [place] AT, moist mucus membranes.  Trachea midline, no masses. Cardiovascular: No clubbing, cyanosis, or edema. Respiratory: Normal respiratory effort, no increased work of breathing. GI: Abdomen is soft, nontender,  nondistended, no abdominal masses GU: No CVA tenderness.  Skin: No rashes, bruises or suspicious lesions. Lymph: No cervical or inguinal adenopathy. Neurologic: Grossly intact, no focal deficits, moving all 4 extremities. Psychiatric: Normal mood and affect.  Laboratory Data: Lab Results  Component Value Date   WBC 7.9 01/27/2017   HGB 13.4 01/27/2017   HCT 40.9 01/27/2017   MCV 73.6 (L) 01/27/2017   PLT 448 (H) 01/27/2017    Lab Results  Component Value Date   CREATININE 0.86 01/27/2017    Pertinent Imaging: Imaging was personally reviewed  Results for orders placed during the hospital encounter of 02/09/17  Abdomen 1 view (KUB)   Narrative CLINICAL DATA:  History of left kidney stone. Patient has dense placed last Friday.  EXAM: ABDOMEN - 1 VIEW  COMPARISON:  CT abdomen pelvis January 27, 2017  FINDINGS: The bowel gas pattern is normal. Left double pigtail ureteral stent is identified in good position. There is a 2 mm calcific density adjacent to the distal portion of the  left ureteral stent questioned stone in the bladder or UVJ. Prior cholecystectomy clips are identified.  IMPRESSION: Left double pigtail ureteral stent is identified in good position. There is a 2 mm calcific density adjacent to the distal portion of the left ureteral stent questioned stone in the bladder or UVJ.   Electronically Signed   By: Abelardo Diesel M.D.   On: 02/09/2017 14:59    Results for orders placed during the hospital encounter of 02/27/17  US RENAL   Narrative CLINICAL DATA:  History of a left ureteral stone and left renal stones.  EXAM: RENAL / URINARY TRACT ULTRASOUND COMPLETE  COMPARISON:  CT, 01/27/2017  FINDINGS: Right Kidney:  Length: 13.5 cm. Echogenicity within normal limits. No mass or hydronephrosis visualized.  Left Kidney:  Length: 13.4 cm. Normal parenchymal echogenicity. There is a 7 mm nonobstructing stone in the midpole. Other small subtle  echogenic foci other noted which may reflect additional stones. No masses. No hydronephrosis.  Bladder:  Appears normal for degree of bladder distention.  IMPRESSION: 1. No acute findings.  No hydronephrosis. 2. 7 mm nonobstructing stone in the left kidney. Probable additional small left intrarenal stones.   Electronically Signed   By: Lajean Manes M.D.   On: 02/27/2017 20:31     Results for orders placed during the hospital encounter of 01/27/17  CT Renal Stone Study   Narrative CLINICAL DATA:  Left-sided abdomen and pelvic pain since 4 a.m. History of kidney stones.  EXAM: CT ABDOMEN AND PELVIS WITHOUT CONTRAST  TECHNIQUE: Multidetector CT imaging of the abdomen and pelvis was performed following the standard protocol without IV contrast.  COMPARISON:  08/05/2016  FINDINGS: Lower chest: No acute abnormality.  Hepatobiliary: Postcholecystectomy. Liver is unremarkable in appearance.  Pancreas: Unremarkable  Spleen: Unremarkable  Adrenals/Urinary Tract: Moderate left hydronephrosis is associated with an 8 mm left ureterovesical junction calculus. Bilateral nephrolithiasis is again demonstrated. The largest calculus in the left kidney measures 12 mm in the lower pole. Largest calculus within the right renal collecting system is 8 mm in the upper pole. No evidence of right ureteral calculus. Adrenal glands are unremarkable. Bladder is decompressed.  Stomach/Bowel: No evidence of disproportionate dilatation of bowel. No obvious mass in the colon. Appendix is not clearly visualized. Stomach and duodenum are decompressed.  Vascular/Lymphatic: No evidence of aortic aneurysm. Small bowel mesenteric lymph nodes are stable.  Reproductive: Uterus and adnexa are within normal limits for age.  Other: No free-fluid.  Musculoskeletal: No vertebral compression deformity.  IMPRESSION: 8 mm left ureterovesical junction calculus is associated with left hydronephrosis,  a secondary finding of ureteral obstruction.  Bilateral nephrolithiasis.   Electronically Signed   By: Marybelle Killings M.D.   On: 01/27/2017 09:06     Assessment & Plan:   Doing well status post left ureteroscopic stone removal.  Her renal ultrasound showed no hydronephrosis and it was felt she had a 7 mm left renal calculus.  Her stones were radiopaque and easily seen on plain film.  All calyces were examined intraoperatively and a 7 mm stone was not present.  Her postop KUB showed no 7 mm calculi and feel the reading does not represent a true calculus.  It has been approximately 4 years since her last metabolic evaluation and have recommended a repeat 24-hour urine study/blood work.   Return in about 4 months (around 06/30/2017) for Recheck, KUB.  Abbie Sons, [place] [address] [place], [place] [address] [phone]

## 2017-06-28 ENCOUNTER — Ambulatory Visit: Payer: BLUE CROSS/BLUE SHIELD | Admitting: Obstetrics and Gynecology

## 2017-07-04 ENCOUNTER — Ambulatory Visit: Payer: BLUE CROSS/BLUE SHIELD | Admitting: Urology

## 2018-02-04 ENCOUNTER — Encounter: Payer: Self-pay | Admitting: Intensive Care

## 2018-02-04 ENCOUNTER — Emergency Department: Payer: BLUE CROSS/BLUE SHIELD

## 2018-02-04 ENCOUNTER — Emergency Department
Admission: EM | Admit: 2018-02-04 | Discharge: 2018-02-04 | Disposition: A | Discharge status: Home / Self Care | Payer: BLUE CROSS/BLUE SHIELD | Attending: Emergency Medicine | Admitting: Emergency Medicine

## 2018-02-04 ENCOUNTER — Other Ambulatory Visit: Payer: Self-pay

## 2018-02-04 DIAGNOSIS — R0602 Shortness of breath: Secondary | ICD-10-CM

## 2018-02-04 DIAGNOSIS — I808 Phlebitis and thrombophlebitis of other sites: Secondary | ICD-10-CM

## 2018-02-04 DIAGNOSIS — M79621 Pain in right upper arm: Secondary | ICD-10-CM | POA: Diagnosis not present

## 2018-02-04 DIAGNOSIS — I809 Phlebitis and thrombophlebitis of unspecified site: Secondary | ICD-10-CM | POA: Diagnosis not present

## 2018-02-04 DIAGNOSIS — M79601 Pain in right arm: Secondary | ICD-10-CM | POA: Diagnosis not present

## 2018-02-04 DIAGNOSIS — M79603 Pain in arm, unspecified: Secondary | ICD-10-CM

## 2018-02-04 DIAGNOSIS — I82611 Acute embolism and thrombosis of superficial veins of right upper extremity: Secondary | ICD-10-CM | POA: Diagnosis not present

## 2018-02-04 NOTE — Discharge Instructions (Signed)
You have basilic vein superficial thrombophlebitis. We discussed with vascular surgery who recommended repeat ultrasound/exam in about a week given your history of upper extremity DVT.

## 2018-02-04 NOTE — ED Notes (Signed)
Pt verbalizes understanding of d/c instructions, medications and follow up 

## 2018-02-04 NOTE — ED Notes (Signed)
Verbal orders put in per MD quale

## 2018-02-04 NOTE — ED Provider Notes (Signed)
[facility] Emergency Department Provider Note   ____________________________________________    I have reviewed the triage vital signs and the nursing notes.   HISTORY  Chief Complaint Arm Pain and Shortness of Breath     HPI Diana Sexton is a 32 y.o. female who presents with discomfort in her right arm through the [facility] fossa which she noted when she woke up this morning.  Denies injury to the area.  No recent venipuncture.  She denies shortness of breath to me.  No fevers or chills or cough.  No IV drug abuse.  Reports a history 6 years ago of a DVT in her right arm during pregnancy which required blood thinners.  None since then.  No pleurisy.   Past Medical History:  Diagnosis Date  . Anemia   . Anxiety   . Cellulitis 2012   caused stillbirth  . DVT (deep venous thrombosis) ([place]) 06/2011   under right clavicle  . Dysmenorrhea   . Family history of breast cancer 11/2013   BRCA/MyRisk neg  . Headache    related to hormonal meds  . History of kidney stones   . History of prothrombin mutation   . Increased risk of breast cancer 11/2013   IBIS=27%  . Obesity     Patient Active Problem List   Diagnosis Date Noted  . Menorrhagia with irregular cycle 10/13/2016  . Other irritable bowel syndrome 05/02/2016  . Urinary tract infection 01/21/2015  . Anxiety 01/21/2015  . Nephrolithiasis 01/15/2015  . Abnormal radiologic findings on diagnostic imaging of renal pelvis, ureter, or bladder 01/15/2015  . Low HDL (under 40) 01/01/2015  . Elevated LDL cholesterol level 01/01/2015  . Insomnia 12/16/2014  . Morbid obesity ([place]) 12/11/2014  . Hematuria 12/11/2014  . Preventative health care 12/11/2014  . Low back pain 10/13/2014  . History of prothrombin mutation 10/13/2014    Past Surgical History:  Procedure Laterality Date  . CHOLECYSTECTOMY    . CYSTOSCOPY W/ RETROGRADES Left 02/02/2017   Procedure: CYSTOSCOPY WITH RETROGRADE PYELOGRAM;   Surgeon: Abbie Sons, MD;  Location: [facility] ORS;  Service: Urology;  Laterality: Left;  . CYSTOSCOPY/URETEROSCOPY/HOLMIUM LASER/STENT PLACEMENT Left 02/02/2017   Procedure: CYSTOSCOPY/URETEROSCOPY/HOLMIUM LASER/STENT PLACEMENT;  Surgeon: Abbie Sons, MD;  Location: [facility] ORS;  Service: Urology;  Laterality: Left;  . DILITATION & CURRETTAGE/HYSTROSCOPY WITH ESSURE    . INTRAUTERINE DEVICE (IUD) INSERTION    . LITHOTRIPSY    . TONSILLECTOMY    . TONSILLECTOMY AND ADENOIDECTOMY    . TUBAL LIGATION      Prior to Admission medications   Medication Sig Start Date End Date Taking? Authorizing Provider  albuterol (PROVENTIL HFA;VENTOLIN HFA) 108 (90 Base) MCG/ACT inhaler Inhale 2 puffs into the lungs every 6 (six) hours as needed for wheezing or shortness of breath. 11/09/15   Volney American, PA-C  ibuprofen (ADVIL,MOTRIN) 600 MG tablet TAKE 600 MG TWICE DAILY AS NEEDED FOR PAIN    [provider]  medroxyPROGESTERone (PROVERA) 10 MG tablet Take 1 tablet (10 mg total) by mouth daily for 10 days. Patient taking differently: Take 10 mg by mouth daily. Only takes days 1-10 of the month 01/17/17 01/31/17  Will Bonnet, MD  traZODone (DESYREL) 50 MG tablet Take 0.5-1 tablets (25-50 mg total) by mouth at bedtime as needed for sleep. Patient taking differently: Take 50 mg by mouth at bedtime as needed for sleep.  04/06/16   Volney American, PA-C  Allergies Penicillins  Family History  Problem Relation Age of Onset  . Cancer Mother        breast  . Diabetes Father   . Hypertension Father   . Heart disease Maternal Grandfather   . Dementia Paternal Grandmother   . Bladder Cancer Neg Hx   . Kidney cancer Neg Hx     Social History Social History   Tobacco Use  . Smoking status: Never Smoker  . Smokeless tobacco: Never Used  Substance Use Topics  . Alcohol use: Yes    Comment: 1 drink per month  . Drug use: No    Review of Systems  Constitutional: No  fever/chills Eyes: No visual changes.  ENT: No neck pain Cardiovascular: Denies chest pain. Respiratory: Denies shortness of breath.  No pleurisy Gastrointestinal: No abdominal pain.     Genitourinary: No groin pain Musculoskeletal: As above Skin: Negative for rash. Neurological: Negative for numbness or tingling   ____________________________________________   PHYSICAL EXAM:  VITAL SIGNS: ED Triage Vitals [02/04/18 1142]  Enc Vitals Group     BP (!) 164/72     Pulse Rate 78     Resp 18     Temp 98.1 F (36.7 C)     Temp Source Oral     SpO2 99 %     Weight 136.1 kg (300 lb)     Height 1.651 m ('5\' 5"'$ )     Head Circumference      Peak Flow      Pain Score 3     Pain Loc      Pain Edu?      Excl. in [place]?     Constitutional: Alert and oriented.  Eyes: Conjunctivae are normal.  . Nose: No congestion/rhinnorhea. Mouth/Throat: Mucous membranes are moist.    Cardiovascular: Normal rate, regular rhythm. Grossly normal heart sounds.  Good peripheral circulation.  Extremities are well perfused, normal cap refill Respiratory: Normal respiratory effort.  No retractions. Lungs CTAB. Gastrointestinal: Soft and nontender. No distention.  No CVA tenderness.  Musculoskeletal: Right arm: No significant swelling, minimal tenderness in the AC fossa extending to approximately midway down the forearm, no objective swelling noted, no cords, no erythema.  Warm and well perfused Neurologic:  Normal speech and language. No gross focal neurologic deficits are appreciated.  Skin:  Skin is warm, dry and intact. No rash noted. Psychiatric: Mood and affect are normal. Speech and behavior are normal.  ____________________________________________   LABS (all labs ordered are listed, but only abnormal results are displayed)  Labs Reviewed - No data to display ____________________________________________  EKG  ED ECG REPORT I, Lavonia Drafts, the attending physician, personally viewed and  interpreted this ECG.  Date: 02/04/2018  Rhythm: normal sinus rhythm QRS Axis: normal Intervals: normal ST/T Wave abnormalities: normal Narrative Interpretation: no evidence of acute ischemia  ____________________________________________  RADIOLOGY  Chest x-ray normal Sound demonstrates superficial thrombophlebitis ____________________________________________   PROCEDURES  Procedure(s) performed: No  Procedures   Critical Care performed: No ____________________________________________   INITIAL IMPRESSION / ASSESSMENT AND PLAN / ED COURSE  Pertinent labs & imaging results that were available during my care of the patient were reviewed by me and considered in my medical decision making (see chart for details).  Discussed ultrasound results with Dr. Lorenso Courier of vascular surgery given the patient's history of a right arm DVT before.  She recommends outpatient follow-up in 1 week, potential re-ultrasound to make sure no worsening but no blood thinners at this time and  supportive care only.  Discussed this with the patient and she is quite comfortable with this plan.  Return precautions discussed    ____________________________________________   FINAL CLINICAL IMPRESSION(S) / ED DIAGNOSES  Final diagnoses:  Arm pain  Superficial thrombophlebitis of right upper extremity        Note:  This document was prepared using Dragon voice recognition software and may include unintentional dictation errors.    Lavonia Drafts, MD 02/04/18 [phone]

## 2018-02-04 NOTE — ED Triage Notes (Signed)
Patient reports since awakening 0630 this morning she has had R forearm pain. HX blood clot in Right arm years ago. Patient also c/o SOB and having to take deep breaths to get air. A&O x4

## 2018-02-04 NOTE — ED Notes (Signed)
Pt transported to US

## 2018-02-05 ENCOUNTER — Ambulatory Visit: Payer: BLUE CROSS/BLUE SHIELD | Admitting: Family Medicine

## 2018-02-05 ENCOUNTER — Encounter: Payer: Self-pay | Admitting: Family Medicine

## 2018-02-05 VITALS — BP 150/75 | HR 88 | Temp 98.5°F | Wt 313.0 lb

## 2018-02-05 DIAGNOSIS — I808 Phlebitis and thrombophlebitis of other sites: Secondary | ICD-10-CM | POA: Diagnosis not present

## 2018-02-05 DIAGNOSIS — Z862 Personal history of diseases of the blood and blood-forming organs and certain disorders involving the immune mechanism: Secondary | ICD-10-CM

## 2018-02-05 NOTE — Progress Notes (Signed)
BP (!) 150/75   Pulse 88   Temp 98.5 F (36.9 C) (Oral)   Wt (!) 313 lb (142 kg)   LMP 02/02/2018 (Exact Date) Comment: tubal ligation  SpO2 97%   BMI 52.09 kg/m    Subjective:    Patient ID: Diana Sexton, female    DOB: July 17, 1985, 32 y.o.   MRN: [id number]  HPI: Diana Sexton is a 32 y.o. female  Chief Complaint  Patient presents with  . Hospitalization Follow-up    Blood clot in R. Elbow. Discussed ultrasound results with Dr. Lorenso Courier of vascular surgery given the patient's history of a right arm DVT before.  She recommends outpatient follow-up in 1 week, potential re-ultrasound to make sure no worsening but no blood thinners at this time and supportive care only.     Here today for ER follow up for right UE superficial thrombophlebitis shown on u/s. Vascular was consulted during visit and she was told to have follow up imaging in 1 week and to follow up if worsening. Pain and swelling worsening since d/c home. Having some chest tightness but no palpitations, SOB, CP - this was evaluated in the ER without abnormal chest findings. Hx of unprovoked DVT in 2013, known heterozygous prothrombin mutation. Was on coumadin for about 6 months after initial event but not on any anticoagulation since.   Relevant past medical, surgical, family and social history reviewed and updated as indicated. Interim medical history since our last visit reviewed. Allergies and medications reviewed and updated.  Review of Systems  Per HPI unless specifically indicated above     Objective:    BP (!) 150/75   Pulse 88   Temp 98.5 F (36.9 C) (Oral)   Wt (!) 313 lb (142 kg)   LMP 02/02/2018 (Exact Date) Comment: tubal ligation  SpO2 97%   BMI 52.09 kg/m   Wt Readings from Last 3 Encounters:  02/05/18 (!) 313 lb (142 kg)  02/04/18 300 lb (136.1 kg)  03/02/17 (!) 308 lb (139.7 kg)    Physical Exam Vitals signs and nursing note reviewed.  Constitutional:      Appearance: Normal appearance.  She is obese.  HENT:     Head: Atraumatic.  Eyes:     Extraocular Movements: Extraocular movements intact.     Conjunctiva/sclera: Conjunctivae normal.  Neck:     Musculoskeletal: Normal range of motion and neck supple.  Cardiovascular:     Rate and Rhythm: Normal rate and regular rhythm.     Pulses: Normal pulses.  Pulmonary:     Effort: Pulmonary effort is normal. No respiratory distress.     Breath sounds: Normal breath sounds.  Musculoskeletal: Normal range of motion.        General: Swelling (right elbow/forearm) and tenderness (in area of swelling) present.  Skin:    Findings: No bruising or erythema.  Neurological:     Mental Status: She is alert and oriented to person, place, and time. Mental status is at baseline.  Psychiatric:        Mood and Affect: Mood normal.        Thought Content: Thought content normal.     Results for orders placed or performed in visit on 02/09/17  Microscopic Examination  Result Value Ref Range   WBC, UA 6-10 (A) 0 - 5 /hpf   RBC, UA >30 (H) 0 - 2 /hpf   Epithelial Cells (non renal) 0-10 0 - 10 /hpf   Mucus, UA Present (A)  Not Estab.   Bacteria, UA Moderate (A) None seen/Few  Urinalysis, Complete  Result Value Ref Range   Specific Gravity, UA >1.030 (H) 1.005 - 1.030   pH, UA 5.5 5.0 - 7.5   Color, UA Red (A) Yellow   Appearance Ur Cloudy (A) Clear   Leukocytes, UA 1+ (A) Negative   Protein, UA 3+ (A) Negative/Trace   Glucose, UA Negative Negative   Ketones, UA Negative Negative   RBC, UA 3+ (A) Negative   Bilirubin, UA Negative Negative   Urobilinogen, Ur 0.2 0.2 - 1.0 mg/dL   Nitrite, UA Negative Negative   Microscopic Examination See below:       Assessment & Plan:   Problem List Items Addressed This Visit      Hematopoietic and Hemostatic   History of prothrombin mutation    Given two unprovoked events, may need to consult with Hematology about long term prevention. Will send for Vascular eval for current issue and  decide from there      Relevant Orders   Ambulatory referral to Vascular Surgery    Other Visit Diagnoses    Superficial thrombophlebitis of right upper extremity    -  Primary   Repeat U/S ordered, vascular referral placed given worsening sxs. OTC pain relievers prn. Strict ER return precautions reviewed   Relevant Orders   Ambulatory referral to Vascular Surgery   US Venous Img Upper Uni Right       Follow up plan: Return if symptoms worsen or fail to improve.

## 2018-02-07 NOTE — Assessment & Plan Note (Signed)
Given two unprovoked events, may need to consult with Hematology about long term prevention. Will send for Vascular eval for current issue and decide from there

## 2018-02-08 ENCOUNTER — Observation Stay
Admission: EM | Admit: 2018-02-08 | Discharge: 2018-02-10 | Disposition: A | Discharge status: Home / Self Care | Payer: BLUE CROSS/BLUE SHIELD | Attending: Internal Medicine | Admitting: Internal Medicine

## 2018-02-08 ENCOUNTER — Emergency Department: Payer: BLUE CROSS/BLUE SHIELD

## 2018-02-08 ENCOUNTER — Other Ambulatory Visit: Payer: Self-pay

## 2018-02-08 DIAGNOSIS — I82409 Acute embolism and thrombosis of unspecified deep veins of unspecified lower extremity: Secondary | ICD-10-CM | POA: Diagnosis present

## 2018-02-08 DIAGNOSIS — Z8249 Family history of ischemic heart disease and other diseases of the circulatory system: Secondary | ICD-10-CM | POA: Insufficient documentation

## 2018-02-08 DIAGNOSIS — K589 Irritable bowel syndrome without diarrhea: Secondary | ICD-10-CM | POA: Diagnosis not present

## 2018-02-08 DIAGNOSIS — M7989 Other specified soft tissue disorders: Secondary | ICD-10-CM | POA: Diagnosis not present

## 2018-02-08 DIAGNOSIS — Z880 Allergy status to penicillin: Secondary | ICD-10-CM | POA: Insufficient documentation

## 2018-02-08 DIAGNOSIS — D6852 Prothrombin gene mutation: Secondary | ICD-10-CM | POA: Insufficient documentation

## 2018-02-08 DIAGNOSIS — I82611 Acute embolism and thrombosis of superficial veins of right upper extremity: Principal | ICD-10-CM | POA: Insufficient documentation

## 2018-02-08 DIAGNOSIS — Z6841 Body Mass Index (BMI) 40.0 and over, adult: Secondary | ICD-10-CM | POA: Insufficient documentation

## 2018-02-08 DIAGNOSIS — I82621 Acute embolism and thrombosis of deep veins of right upper extremity: Secondary | ICD-10-CM

## 2018-02-08 DIAGNOSIS — R52 Pain, unspecified: Secondary | ICD-10-CM | POA: Diagnosis not present

## 2018-02-08 DIAGNOSIS — M79601 Pain in right arm: Secondary | ICD-10-CM | POA: Diagnosis not present

## 2018-02-08 DIAGNOSIS — R079 Chest pain, unspecified: Secondary | ICD-10-CM | POA: Diagnosis not present

## 2018-02-08 DIAGNOSIS — G4700 Insomnia, unspecified: Secondary | ICD-10-CM | POA: Insufficient documentation

## 2018-02-08 DIAGNOSIS — F419 Anxiety disorder, unspecified: Secondary | ICD-10-CM | POA: Insufficient documentation

## 2018-02-08 DIAGNOSIS — Z23 Encounter for immunization: Secondary | ICD-10-CM | POA: Insufficient documentation

## 2018-02-08 DIAGNOSIS — R609 Edema, unspecified: Secondary | ICD-10-CM

## 2018-02-08 LAB — CBC
HEMATOCRIT: 39.3 % (ref 36.0–46.0)
Hemoglobin: 12.4 g/dL (ref 12.0–15.0)
MCH: 24.7 pg — ABNORMAL LOW (ref 26.0–34.0)
MCHC: 31.6 g/dL (ref 30.0–36.0)
MCV: 78.3 fL — ABNORMAL LOW (ref 80.0–100.0)
Platelets: 436 10*3/uL — ABNORMAL HIGH (ref 150–400)
RBC: 5.02 MIL/uL (ref 3.87–5.11)
RDW: 14.5 % (ref 11.5–15.5)
WBC: 8.3 10*3/uL (ref 4.0–10.5)
nRBC: 0 % (ref 0.0–0.2)

## 2018-02-08 LAB — COMPREHENSIVE METABOLIC PANEL
ALBUMIN: 3.5 g/dL (ref 3.5–5.0)
ALT: 18 U/L (ref 0–44)
AST: 20 U/L (ref 15–41)
Alkaline Phosphatase: 72 U/L (ref 38–126)
Anion gap: 7 (ref 5–15)
BILIRUBIN TOTAL: 0.4 mg/dL (ref 0.3–1.2)
BUN: 11 mg/dL (ref 6–20)
CO2: 26 mmol/L (ref 22–32)
Calcium: 9.1 mg/dL (ref 8.9–10.3)
Chloride: 104 mmol/L (ref 98–111)
Creatinine, Ser: 0.58 mg/dL (ref 0.44–1.00)
GFR calc Af Amer: >60 mL/min (ref >60)
GFR calc non Af Amer: >60 mL/min (ref >60)
Glucose, Bld: 114 mg/dL — ABNORMAL HIGH (ref 70–99)
POTASSIUM: 3.7 mmol/L (ref 3.5–5.1)
Sodium: 137 mmol/L (ref 135–145)
TOTAL PROTEIN: 7.7 g/dL (ref 6.5–8.1)

## 2018-02-08 LAB — TROPONIN I

## 2018-02-08 MED ORDER — MORPHINE SULFATE (PF) 2 MG/ML IV SOLN
2.0000 mg | Freq: Once | INTRAVENOUS | Status: AC
  Administered 2018-02-09: 2 mg via INTRAVENOUS
  Filled 2018-02-08: qty 1

## 2018-02-08 MED ORDER — ONDANSETRON HCL 4 MG/2ML IJ SOLN
4.0000 mg | Freq: Once | INTRAMUSCULAR | Status: AC
  Administered 2018-02-09: 4 mg via INTRAVENOUS
  Filled 2018-02-08: qty 2

## 2018-02-08 NOTE — ED Notes (Signed)
Patient reports recent dx of blood clot to right upper arm, seen and discharged from ed on Monday. Report pain to right arm worse today, pain now radiating into her chest. Also report feeling sob.

## 2018-02-08 NOTE — ED Provider Notes (Signed)
[facility] Emergency Department Provider Note  ____________________________________________   First MD Initiated Contact with Patient 02/08/18 2322     (approximate)  I have reviewed the triage vital signs and the nursing notes.   HISTORY  Chief Complaint Chest Pain  HPI Diana Sexton is a 32 y.o. female with below list of chronic medical conditions including right subclavian DVT during pregnancy 6 years ago and recent hospital evaluation on 02/04/2018 secondary to superficial thrombophlebitis of the basilic antecubital and forearm vein return to the emergency department with worsening right arm pain as well as chest pain and dyspnea.  Patient is not currently on anticoagulation.  In addition patient states that her father has a clotting disorder which she believes is Antithrombin III deficiency.  Current pain score is 9 out of 10.   Past Medical History:  Diagnosis Date  . Anemia   . Anxiety   . Cellulitis 2012   caused stillbirth  . DVT (deep venous thrombosis) ([place]) 06/2011   under right clavicle  . Dysmenorrhea   . Family history of breast cancer 11/2013   BRCA/MyRisk neg  . Headache    related to hormonal meds  . History of kidney stones   . History of prothrombin mutation   . Increased risk of breast cancer 11/2013   IBIS=27%  . Obesity     Patient Active Problem List   Diagnosis Date Noted  . DVT (deep venous thrombosis) ([place]) 02/09/2018  . Menorrhagia with irregular cycle 10/13/2016  . Other irritable bowel syndrome 05/02/2016  . Urinary tract infection 01/21/2015  . Anxiety 01/21/2015  . Nephrolithiasis 01/15/2015  . Abnormal radiologic findings on diagnostic imaging of renal pelvis, ureter, or bladder 01/15/2015  . Low HDL (under 40) 01/01/2015  . Elevated LDL cholesterol level 01/01/2015  . Insomnia 12/16/2014  . Morbid obesity ([place]) 12/11/2014  . Hematuria 12/11/2014  . Preventative health care 12/11/2014  . Low back pain  10/13/2014  . History of prothrombin mutation 10/13/2014  . Hydronephrosis 06/07/2012  . Incomplete emptying of bladder 06/07/2012  . Renal colic 43/88/8757  . Ureteric stone 06/07/2012    Past Surgical History:  Procedure Laterality Date  . CHOLECYSTECTOMY    . CYSTOSCOPY W/ RETROGRADES Left 02/02/2017   Procedure: CYSTOSCOPY WITH RETROGRADE PYELOGRAM;  Surgeon: Abbie Sons, MD;  Location: [facility] ORS;  Service: Urology;  Laterality: Left;  . CYSTOSCOPY/URETEROSCOPY/HOLMIUM LASER/STENT PLACEMENT Left 02/02/2017   Procedure: CYSTOSCOPY/URETEROSCOPY/HOLMIUM LASER/STENT PLACEMENT;  Surgeon: Abbie Sons, MD;  Location: [facility] ORS;  Service: Urology;  Laterality: Left;  . DILITATION & CURRETTAGE/HYSTROSCOPY WITH ESSURE    . INTRAUTERINE DEVICE (IUD) INSERTION    . LITHOTRIPSY    . TONSILLECTOMY    . TONSILLECTOMY AND ADENOIDECTOMY    . TUBAL LIGATION      Prior to Admission medications   Not on File    Allergies Penicillins  Family History  Problem Relation Age of Onset  . Cancer Mother        breast  . Diabetes Father   . Hypertension Father   . Heart disease Maternal Grandfather   . Dementia Paternal Grandmother   . Bladder Cancer Neg Hx   . Kidney cancer Neg Hx     Social History Social History   Tobacco Use  . Smoking status: Never Smoker  . Smokeless tobacco: Never Used  Substance Use Topics  . Alcohol use: Yes    Comment: 1 drink per month  . Drug use: No  Review of Systems Constitutional: No fever/chills Eyes: No visual changes. ENT: No sore throat. Cardiovascular: Positive for chest pain. Respiratory: Positive for shortness of breath. Gastrointestinal: No abdominal pain.  No nausea, no vomiting.  No diarrhea.  No constipation. Genitourinary: Negative for dysuria. Musculoskeletal: Negative for neck pain.  Negative for back pain.  Positive for right arm pain. Integumentary: Negative for rash. Neurological: Negative for headaches, focal weakness  or numbness.   ____________________________________________   PHYSICAL EXAM:  VITAL SIGNS: ED Triage Vitals  Enc Vitals Group     BP 02/08/18 2041 (!) 147/91     Pulse Rate 02/08/18 2041 98     Resp --      Temp 02/08/18 2041 98.2 F (36.8 C)     Temp Source 02/08/18 2041 Oral     SpO2 02/08/18 2041 95 %     Weight 02/08/18 2059 136.1 kg (300 lb)     Height 02/08/18 2059 1.651 m (5' 5" )     Head Circumference --      Peak Flow --      Pain Score 02/08/18 2059 5     Pain Loc --      Pain Edu? --      Excl. in [place]? --     Constitutional: Alert and oriented. Well appearing and in no acute distress.  Eyes: Conjunctivae are normal.  Mouth/Throat: Mucous membranes are moist.  Oropharynx non-erythematous. Neck: No stridor.   Cardiovascular: Normal rate, regular rhythm. Good peripheral circulation. Grossly normal heart sounds. Respiratory: Normal respiratory effort.  No retractions. Lungs CTAB. Gastrointestinal: Soft and nontender. No distention.  Musculoskeletal: Palpation right forearm medial elbow and medial arm Neurologic:  Normal speech and language. No gross focal neurologic deficits are appreciated.  Skin:  Skin is warm, dry and intact. No rash noted. Psychiatric: Mood and affect are normal. Speech and behavior are normal.  ____________________________________________   LABS (all labs ordered are listed, but only abnormal results are displayed)  Labs Reviewed  CBC - Abnormal; Notable for the following components:      Result Value   MCV 78.3 (*)    MCH 24.7 (*)    Platelets 436 (*)    All other components within normal limits  COMPREHENSIVE METABOLIC PANEL - Abnormal; Notable for the following components:   Glucose, Bld 114 (*)    All other components within normal limits  TROPONIN I   ____________________________________________  EKG  ED ECG REPORT I, [place] N , the attending physician, personally viewed and interpreted this ECG.   Date:  02/08/2018  EKG Time: 8:49 PM  Rate: 91  Rhythm: Normal sinus rhythm  Axis: Normal  Intervals: Normal  ST&T Change: None  ____________________________________________  RADIOLOGY I, [place] N , personally viewed and evaluated these images (plain radiographs) as part of my medical decision making, as well as reviewing the written report by the radiologist.  ED MD interpretation: Upper extremity ultrasound revealed a occlusive thrombus in the right basilic and brachial vein.  Official radiology report(s): Ct Angio Chest Pe W And/or Wo Contrast  Result Date: 02/09/2018 CLINICAL DATA:  Chest pain, upper extremity thrombus and family history of clotting disorder. EXAM: CT ANGIOGRAPHY CHEST WITH CONTRAST TECHNIQUE: Multidetector CT imaging of the chest was performed using the standard protocol during bolus administration of intravenous contrast. Multiplanar CT image reconstructions and MIPs were obtained to evaluate the vascular anatomy. CONTRAST:  158m ISOVUE-370 IOPAMIDOL (ISOVUE-370) INJECTION 76% COMPARISON:  None. FINDINGS: Cardiovascular: --Pulmonary arteries: Satisfactory contrast bolus. Beam  attenuation caused by patient body habitus limits assessment beyond the proximal segmental level.Within that limitation, no proximal pulmonary embolus. The main pulmonary artery is within normal limits for size. --Aorta: Satisfactory opacification of the thoracic aorta. No aortic dissection or other acute aortic syndrome. Conventional 3 vessel aortic branching pattern. The aortic course and caliber are normal. There is no aortic atherosclerosis. --Heart: Normal size. No pericardial effusion. Mediastinum/Nodes: No mediastinal, hilar or axillary lymphadenopathy. The visualized thyroid and thoracic esophageal course are unremarkable. Lungs/Pleura: No pulmonary nodules or masses. No pleural effusion or pneumothorax. No focal airspace consolidation. No focal pleural abnormality. Upper Abdomen: Contrast  bolus timing is not optimized for evaluation of the abdominal organs. Within this limitation, the visualized organs of the upper abdomen are normal. Musculoskeletal: No chest wall abnormality. No acute or significant osseous findings. Review of the MIP images confirms the above findings. IMPRESSION: Reduced sensitivity secondary to beam attenuation caused by body habitus. Within that limitation, no proximal pulmonary embolus or other acute thoracic abnormality. Electronically Signed   By: Ulyses Jarred M.D.   On: 02/09/2018 00:53   US Venous Img Upper Uni Right  Result Date: 02/09/2018 CLINICAL DATA:  Subacute onset of right upper extremity pain and swelling. Recently diagnosed with thrombophlebitis. EXAM: RIGHT UPPER EXTREMITY VENOUS DOPPLER ULTRASOUND TECHNIQUE: Gray-scale sonography with graded compression, as well as color Doppler and duplex ultrasound were performed to evaluate the upper extremity deep venous system from the level of the subclavian vein and including the jugular, axillary, basilic, radial, ulnar and upper cephalic vein. Spectral Doppler was utilized to evaluate flow at rest and with distal augmentation maneuvers. COMPARISON:  None. FINDINGS: Contralateral Subclavian Vein: Respiratory phasicity is normal and symmetric with the symptomatic side. No evidence of thrombus. Normal compressibility. Internal Jugular Vein: No evidence of thrombus. Normal compressibility, respiratory phasicity and response to augmentation. Subclavian Vein: No evidence of thrombus. Normal compressibility, respiratory phasicity and response to augmentation. Axillary Vein: No evidence of thrombus. Normal compressibility, respiratory phasicity and response to augmentation. Cephalic Vein: No evidence of thrombus. Normal compressibility, respiratory phasicity and response to augmentation. Basilic Vein: Occlusive thrombus is noted within the basilic vein. Brachial Veins: Occlusive thrombus is noted within one of the  patient's paired brachial veins. Radial Veins: No evidence of thrombus. Normal compressibility, respiratory phasicity and response to augmentation. Ulnar Veins: No evidence of thrombus. Normal compressibility, respiratory phasicity and response to augmentation. Venous Reflux:  None visualized. Other Findings:  None visualized. IMPRESSION: Occlusive thrombus noted in the basilic vein and one of the patient's brachial veins. These results were called by telephone at the time of interpretation on 02/09/2018 at 12:49 am to Dr. Marjean Donna, who verbally acknowledged these results. Electronically Signed   By: Garald Balding M.D.   On: 02/09/2018 00:51    __________________________ Procedures   ____________________________________________   INITIAL IMPRESSION / ASSESSMENT AND PLAN / ED COURSE  As part of my medical decision making, I reviewed the following data within the electronic MEDICAL RECORD NUMBER 32 year old female presenting with above-stated history and physical exam secondary to worsening right arm pain chest pain and dyspnea.  Concern for possible DVT and pulmonary emboli and as such ultrasound performed which revealed an occlusive thrombus in the right basilic and brachial vein.  In addition patient CT scan was technically limited secondary to body habitus and as such no proximal PE was identified.  Patient given Lovenox 1 mg/kg patient discussed with Dr. Jannifer Franklin for hospital admission for further evaluation and management. ____________________________________________  FINAL CLINICAL IMPRESSION(S) / ED DIAGNOSES  Final diagnoses:  Swelling  Pain  Acute deep vein thrombosis (DVT) of other vein of right upper extremity (HCC)     MEDICATIONS GIVEN DURING THIS VISIT:  Medications  morphine 2 MG/ML injection 2 mg (2 mg Intravenous Given 02/09/18 0003)  ondansetron (ZOFRAN) injection 4 mg (4 mg Intravenous Given 02/09/18 0003)  iopamidol (ISOVUE-370) 76 % injection 100 mL (100 mLs  Intravenous Contrast Given 02/09/18 0031)  enoxaparin (LOVENOX) injection 135 mg (135 mg Subcutaneous Given 02/09/18 0152)     ED Discharge Orders    None       Note:  This document was prepared using Dragon voice recognition software and may include unintentional dictation errors.    Gregor Hams, MD 02/09/18 Reece Agar

## 2018-02-08 NOTE — ED Triage Notes (Signed)
Pt was dx with clot to right arm here Monday, states was not put on blood thinners was told to follow up with vascular and PMD. Pt here for worsening to right arm, states also having worsening chest pain states has had chest for few days.

## 2018-02-08 NOTE — ED Notes (Signed)
Report from jeanette, rn.  

## 2018-02-08 NOTE — ED Notes (Signed)
John, rn in to attempt iv insertion.

## 2018-02-09 ENCOUNTER — Emergency Department: Payer: BLUE CROSS/BLUE SHIELD

## 2018-02-09 DIAGNOSIS — I82409 Acute embolism and thrombosis of unspecified deep veins of unspecified lower extremity: Secondary | ICD-10-CM | POA: Diagnosis not present

## 2018-02-09 DIAGNOSIS — R079 Chest pain, unspecified: Secondary | ICD-10-CM | POA: Diagnosis not present

## 2018-02-09 DIAGNOSIS — I82611 Acute embolism and thrombosis of superficial veins of right upper extremity: Secondary | ICD-10-CM | POA: Diagnosis not present

## 2018-02-09 DIAGNOSIS — D6852 Prothrombin gene mutation: Secondary | ICD-10-CM

## 2018-02-09 HISTORY — DX: Prothrombin gene mutation: D68.52

## 2018-02-09 LAB — ANTITHROMBIN III: AntiThromb III Func: 79 % (ref 75–120)

## 2018-02-09 LAB — PROTIME-INR
INR: 0.91
Prothrombin Time: 12.2 seconds (ref 11.4–15.2)

## 2018-02-09 MED ORDER — ONDANSETRON HCL 4 MG/2ML IJ SOLN: 4.0000 mg | Freq: Four times a day (QID) | INTRAMUSCULAR | Status: DC | PRN

## 2018-02-09 MED ORDER — APIXABAN 5 MG PO TABS: 5.0000 mg | ORAL_TABLET | Freq: Two times a day (BID) | ORAL | Status: DC

## 2018-02-09 MED ORDER — ACETAMINOPHEN 650 MG RE SUPP: 650.0000 mg | Freq: Four times a day (QID) | RECTAL | Status: DC | PRN

## 2018-02-09 MED ORDER — ENOXAPARIN SODIUM 150 MG/ML ~~LOC~~ SOLN
1.0000 mg/kg | Freq: Once | SUBCUTANEOUS | Status: AC
  Administered 2018-02-09: 135 mg via SUBCUTANEOUS
  Filled 2018-02-09: qty 0.91

## 2018-02-09 MED ORDER — DOCUSATE SODIUM 100 MG PO CAPS
100.0000 mg | ORAL_CAPSULE | Freq: Two times a day (BID) | ORAL | Status: DC
  Administered 2018-02-09 – 2018-02-10 (×3): 100 mg via ORAL
  Filled 2018-02-09 (×3): qty 1

## 2018-02-09 MED ORDER — ONDANSETRON HCL 4 MG PO TABS
4.0000 mg | ORAL_TABLET | Freq: Four times a day (QID) | ORAL | Status: DC | PRN
  Administered 2018-02-10: 4 mg via ORAL
  Filled 2018-02-09: qty 1

## 2018-02-09 MED ORDER — INFLUENZA VAC SPLIT QUAD 0.5 ML IM SUSY
0.5000 mL | PREFILLED_SYRINGE | INTRAMUSCULAR | Status: AC
  Administered 2018-02-10: 0.5 mL via INTRAMUSCULAR
  Filled 2018-02-09: qty 0.5

## 2018-02-09 MED ORDER — OXYCODONE-ACETAMINOPHEN 5-325 MG PO TABS
2.0000 | ORAL_TABLET | Freq: Four times a day (QID) | ORAL | Status: DC | PRN
  Administered 2018-02-09 – 2018-02-10 (×3): 2 via ORAL
  Filled 2018-02-09 (×3): qty 2

## 2018-02-09 MED ORDER — APIXABAN 5 MG PO TABS
10.0000 mg | ORAL_TABLET | Freq: Two times a day (BID) | ORAL | Status: DC
  Administered 2018-02-09 – 2018-02-10 (×3): 10 mg via ORAL
  Filled 2018-02-09 (×3): qty 2

## 2018-02-09 MED ORDER — ACETAMINOPHEN 325 MG PO TABS
650.0000 mg | ORAL_TABLET | Freq: Four times a day (QID) | ORAL | Status: DC | PRN
  Administered 2018-02-09 (×2): 650 mg via ORAL
  Filled 2018-02-09 (×2): qty 2

## 2018-02-09 MED ORDER — IOPAMIDOL (ISOVUE-370) INJECTION 76%
100.0000 mL | Freq: Once | INTRAVENOUS | Status: AC | PRN
  Administered 2018-02-09: 100 mL via INTRAVENOUS

## 2018-02-09 NOTE — ED Notes (Signed)
ED TO INPATIENT HANDOFF REPORT  Name/Age/Gender Diana Sexton 32 y.o. female  Code Status   Home/SNF/Other Home  Chief Complaint Pain in Arm; Chest Pain  Level of Care/Admitting Diagnosis ED Disposition    ED Disposition Condition Comment   Admit  Hospital Area: [facility] [[id number]]  Level of Care: Med-Surg [16]  Diagnosis: DVT (deep venous thrombosis) [facility]) [[id number]]  Admitting Physician: Harrie Foreman [[id number]]  Attending Physician: Harrie Foreman [phone]  PT Class (Do Not Modify): Observation [104]  PT Acc Code (Do Not Modify): Observation [10022]       Medical History Past Medical History:  Diagnosis Date  . Anemia   . Anxiety   . Cellulitis 2012   caused stillbirth  . DVT (deep venous thrombosis) ([place]) 06/2011   under right clavicle  . Dysmenorrhea   . Family history of breast cancer 11/2013   BRCA/MyRisk neg  . Headache    related to hormonal meds  . History of kidney stones   . History of prothrombin mutation   . Increased risk of breast cancer 11/2013   IBIS=27%  . Obesity     Allergies Allergies  Allergen Reactions  . Penicillins Rash    Has patient had a PCN reaction causing immediate rash, facial/tongue/throat swelling, SOB or lightheadedness with hypotension: Yes Has patient had a PCN reaction causing severe rash involving mucus membranes or skin necrosis: Yes Has patient had a PCN reaction that required hospitalization: No Has patient had a PCN reaction occurring within the last 10 years: Yes If all of the above answers are "NO", then may proceed with Cephalosporin use.     IV Location/Drains/Wounds Patient Lines/Drains/Airways Status   Active Line/Drains/Airways    Name:   Placement date:   Placement time:   Site:   Days:   Peripheral IV 02/09/18 Left Forearm   02/09/18    -    Forearm   less than 1   Ureteral Drain/Stent Left ureter 6 Fr.   02/02/17    1051    Left ureter   372   Incision (Closed)  02/02/17 Vagina Other (Comment)   02/02/17    0934     372          Labs/Imaging Results for orders placed or performed during the hospital encounter of 02/08/18 (from the past 48 hour(s))  CBC     Status: Abnormal   Collection Time: 02/08/18  8:54 PM  Result Value Ref Range   WBC 8.3 4.0 - 10.5 K/uL   RBC 5.02 3.87 - 5.11 MIL/uL   Hemoglobin 12.4 12.0 - 15.0 g/dL   HCT 39.3 36.0 - 46.0 %   MCV 78.3 (L) 80.0 - 100.0 fL   MCH 24.7 (L) 26.0 - 34.0 pg   MCHC 31.6 30.0 - 36.0 g/dL   RDW 14.5 11.5 - 15.5 %   Platelets 436 (H) 150 - 400 K/uL   nRBC 0.0 0.0 - 0.2 %    Comment: Performed at [facility], [address] [place],  [address]  Comprehensive metabolic panel     Status: Abnormal   Collection Time: 02/08/18  8:54 PM  Result Value Ref Range   Sodium 137 135 - 145 mmol/L   Potassium 3.7 3.5 - 5.1 mmol/L   Chloride 104 98 - 111 mmol/L   CO2 26 22 - 32 mmol/L   Glucose, Bld 114 (H) 70 - 99 mg/dL   BUN 11 6 - 20 mg/dL  Creatinine, Ser 0.58 0.44 - 1.00 mg/dL   Calcium 9.1 8.9 - 10.3 mg/dL   Total Protein 7.7 6.5 - 8.1 g/dL   Albumin 3.5 3.5 - 5.0 g/dL   AST 20 15 - 41 U/L   ALT 18 0 - 44 U/L   Alkaline Phosphatase 72 38 - 126 U/L   Total Bilirubin 0.4 0.3 - 1.2 mg/dL   GFR calc non Af Amer >60 >60 mL/min   GFR calc Af Amer >60 >60 mL/min   Anion gap 7 5 - 15    Comment: Performed at [facility], [address] [place], [place] [address]  Troponin I - ONCE - STAT     Status: None   Collection Time: 02/08/18  8:54 PM  Result Value Ref Range   Troponin I <0.03 <0.03 ng/mL    Comment: Performed at [facility], [address] [place], [place] [address]  Protime-INR     Status: None   Collection Time: 02/08/18  8:54 PM  Result Value Ref Range   Prothrombin Time 12.2 11.4 - 15.2 seconds   INR 0.91     Comment: Performed at [facility], [place],  [address]   Ct Angio Chest Pe W And/or Wo  Contrast  Result Date: 02/09/2018 CLINICAL DATA:  Chest pain, upper extremity thrombus and family history of clotting disorder. EXAM: CT ANGIOGRAPHY CHEST WITH CONTRAST TECHNIQUE: Multidetector CT imaging of the chest was performed using the standard protocol during bolus administration of intravenous contrast. Multiplanar CT image reconstructions and MIPs were obtained to evaluate the vascular anatomy. CONTRAST:  164m ISOVUE-370 IOPAMIDOL (ISOVUE-370) INJECTION 76% COMPARISON:  None. FINDINGS: Cardiovascular: --Pulmonary arteries: Satisfactory contrast bolus. Beam attenuation caused by patient body habitus limits assessment beyond the proximal segmental level.Within that limitation, no proximal pulmonary embolus. The main pulmonary artery is within normal limits for size. --Aorta: Satisfactory opacification of the thoracic aorta. No aortic dissection or other acute aortic syndrome. Conventional 3 vessel aortic branching pattern. The aortic course and caliber are normal. There is no aortic atherosclerosis. --Heart: Normal size. No pericardial effusion. Mediastinum/Nodes: No mediastinal, hilar or axillary lymphadenopathy. The visualized thyroid and thoracic esophageal course are unremarkable. Lungs/Pleura: No pulmonary nodules or masses. No pleural effusion or pneumothorax. No focal airspace consolidation. No focal pleural abnormality. Upper Abdomen: Contrast bolus timing is not optimized for evaluation of the abdominal organs. Within this limitation, the visualized organs of the upper abdomen are normal. Musculoskeletal: No chest wall abnormality. No acute or significant osseous findings. Review of the MIP images confirms the above findings. IMPRESSION: Reduced sensitivity secondary to beam attenuation caused by body habitus. Within that limitation, no proximal pulmonary embolus or other acute thoracic abnormality. Electronically Signed   By: KUlyses JarredM.D.   On: 02/09/2018 00:53   UKoreaVenous Img Upper Uni  Right  Result Date: 02/09/2018 CLINICAL DATA:  Subacute onset of right upper extremity pain and swelling. Recently diagnosed with thrombophlebitis. EXAM: RIGHT UPPER EXTREMITY VENOUS DOPPLER ULTRASOUND TECHNIQUE: Gray-scale sonography with graded compression, as well as color Doppler and duplex ultrasound were performed to evaluate the upper extremity deep venous system from the level of the subclavian vein and including the jugular, axillary, basilic, radial, ulnar and upper cephalic vein. Spectral Doppler was utilized to evaluate flow at rest and with distal augmentation maneuvers. COMPARISON:  None. FINDINGS: Contralateral Subclavian Vein: Respiratory phasicity is normal and symmetric with the symptomatic side. No evidence of thrombus. Normal compressibility. Internal Jugular Vein:  No evidence of thrombus. Normal compressibility, respiratory phasicity and response to augmentation. Subclavian Vein: No evidence of thrombus. Normal compressibility, respiratory phasicity and response to augmentation. Axillary Vein: No evidence of thrombus. Normal compressibility, respiratory phasicity and response to augmentation. Cephalic Vein: No evidence of thrombus. Normal compressibility, respiratory phasicity and response to augmentation. Basilic Vein: Occlusive thrombus is noted within the basilic vein. Brachial Veins: Occlusive thrombus is noted within one of the patient's paired brachial veins. Radial Veins: No evidence of thrombus. Normal compressibility, respiratory phasicity and response to augmentation. Ulnar Veins: No evidence of thrombus. Normal compressibility, respiratory phasicity and response to augmentation. Venous Reflux:  None visualized. Other Findings:  None visualized. IMPRESSION: Occlusive thrombus noted in the basilic vein and one of the patient's brachial veins. These results were called by telephone at the time of interpretation on 02/09/2018 at 12:49 am to Dr. Marjean Donna, who verbally acknowledged  these results. Electronically Signed   By: Garald Balding M.D.   On: 02/09/2018 00:51    Pending Labs Unresulted Labs (From admission, onward)   None      Vitals/Pain Today's Vitals   02/09/18 0000 02/09/18 0115 02/09/18 0215 02/09/18 0300  BP:    (!) 147/91  Pulse:    87  Resp: _0 Temp:      TempSrc:      SpO2:    91%  Weight:      Height:      PainSc:        Isolation Precautions No active isolations  Medications Medications  morphine 2 MG/ML injection 2 mg (2 mg Intravenous Given 02/09/18 0003)  ondansetron (ZOFRAN) injection 4 mg (4 mg Intravenous Given 02/09/18 0003)  iopamidol (ISOVUE-370) 76 % injection 100 mL (100 mLs Intravenous Contrast Given 02/09/18 0031)  enoxaparin (LOVENOX) injection 135 mg (135 mg Subcutaneous Given 02/09/18 0152)    Mobility {Mobility:2014

## 2018-02-09 NOTE — Progress Notes (Signed)
Advanced care plan.  Purpose of the Encounter: CODE STATUS  Parties in Attendance: Patient  Patient's Decision Capacity: Good  Subjective/Patient's story: Presented to the emergency room for chest pain and pain presented to the emergency room for chest pain and pain in the right forearm and arm   Objective/Medical story Patient was worked up with venous Doppler ultrasound which showed basilic vein thrombosis She needs anticoagulation Hematology evaluation   Goals of care determination:  Advance directives and goals of care and treatment plan discussed For now patient wants everything done which includes CPR, intubation ventilator if the need arises  CODE STATUS: Full code   Time spent discussing advanced care planning: 16 minutes

## 2018-02-09 NOTE — Progress Notes (Signed)
ANTICOAGULATION CONSULT NOTE - Initial Consult  Pharmacy Consult for apixaban Indication: DVT  Allergies  Allergen Reactions  . Penicillins Rash    Has patient had a PCN reaction causing immediate rash, facial/tongue/throat swelling, SOB or lightheadedness with hypotension: Yes Has patient had a PCN reaction causing severe rash involving mucus membranes or skin necrosis: Yes Has patient had a PCN reaction that required hospitalization: No Has patient had a PCN reaction occurring within the last 10 years: Yes If all of the above answers are "NO", then may proceed with Cephalosporin use.     Patient Measurements: Height: 5' 5"  (165.1 cm) Weight: (!) 318 lb 1.6 oz (144.3 kg) IBW/kg (Calculated) : 57 Heparin Dosing Weight:   Vital Signs: Temp: 98.2 F (36.8 C) (12/28 0534) Temp Source: Oral (12/28 0534) BP: 118/55 (12/28 0534) Pulse Rate: 78 (12/28 0534)  Labs: Recent Labs    02/08/18 2054  HGB 12.4  HCT 39.3  PLT 436*  LABPROT 12.2  INR 0.91  CREATININE 0.58  TROPONINI <0.03    Estimated Creatinine Clearance: 147.8 mL/min (by C-G formula based on SCr of 0.58 mg/dL).   Medical History: Past Medical History:  Diagnosis Date  . Anemia   . Anxiety   . Cellulitis 2012   caused stillbirth  . DVT (deep venous thrombosis) ([place]) 06/2011   under right clavicle  . Dysmenorrhea   . Family history of breast cancer 11/2013   BRCA/MyRisk neg  . Headache    related to hormonal meds  . History of kidney stones   . History of prothrombin mutation   . Increased risk of breast cancer 11/2013   IBIS=27%  . Obesity     Medications:  Infusions:    Assessment: 32 yof cc CP found to have DVT. No PTA meds but patient did receive enoxaparin treatment dosing x 1 last night. Pharmacy consulted to start apixaban for DVT.   Goal of Therapy:  Monitor platelets by anticoagulation protocol: Yes   Plan:  Apixaban 10 mg po BID x 7 days followed by apixaban 5 mg po BID. Pharmacy  will continue to follow CBC/SCr Q3D per protocol.  Laural Benes, PharmD, BCPS Clinical Pharmacist 02/09/2018,11:02 AM

## 2018-02-09 NOTE — Consult Note (Signed)
Patient is a 32 year old female who presented to the emergency room with complaints of increasing arm pain and shortness of breath.  Subsequent ultrasound revealed unprovoked DVT.  CT scan did not report any pulmonary embolism.  This is patient's second blood clot.  She reports a DVT during pregnancy approximately 6 years ago.  She also reports a family history of blood clotting disorder with her father.  She has been initiated on Lovenox.  Full hypercoagulable work-up has been ordered.  Patient can be transitioned to Eliquis.  Full consult to follow.  If patient is stable for discharge prior to evaluation, okay to do so from a hematology standpoint.  Patient can then follow-up as an outpatient to discuss her laboratory results.

## 2018-02-09 NOTE — H&P (Signed)
Diana Sexton is an 32 y.o. female.   Chief Complaint: Chest pain HPI: Patient with past medical history of DVT presents to the emergency department complaining of pain in her right arm that radiates into her chest.  The patient reports being diagnosed with a blood clot in the right arm a few days ago.  She was not started on anticoagulation by the emergency department but told to follow-up with her primary care doctor.  Somehow she was lost to follow-up and now returns with pain and swelling in the arm that radiates into her chest.  She admits to some shortness of breath at times but thinks that it is related to the pain.  CTA of her chest did not show pulmonary embolism.  Notably the patient reports that her father has a history of clotting disorder that she believes to be a prothrombin mutation.  She was started on therapeutic Lovenox in the emergency department prior to the hospitalist service being called for admission.  Past Medical History:  Diagnosis Date  . Anemia   . Anxiety   . Cellulitis 2012   caused stillbirth  . DVT (deep venous thrombosis) ([place]) 06/2011   under right clavicle  . Dysmenorrhea   . Family history of breast cancer 11/2013   BRCA/MyRisk neg  . Headache    related to hormonal meds  . History of kidney stones   . History of prothrombin mutation   . Increased risk of breast cancer 11/2013   IBIS=27%  . Obesity     Past Surgical History:  Procedure Laterality Date  . CHOLECYSTECTOMY    . CYSTOSCOPY W/ RETROGRADES Left 02/02/2017   Procedure: CYSTOSCOPY WITH RETROGRADE PYELOGRAM;  Surgeon: Abbie Sons, MD;  Location: [facility] ORS;  Service: Urology;  Laterality: Left;  . CYSTOSCOPY/URETEROSCOPY/HOLMIUM LASER/STENT PLACEMENT Left 02/02/2017   Procedure: CYSTOSCOPY/URETEROSCOPY/HOLMIUM LASER/STENT PLACEMENT;  Surgeon: Abbie Sons, MD;  Location: [facility] ORS;  Service: Urology;  Laterality: Left;  . DILITATION & CURRETTAGE/HYSTROSCOPY WITH ESSURE    .  INTRAUTERINE DEVICE (IUD) INSERTION    . LITHOTRIPSY    . TONSILLECTOMY    . TONSILLECTOMY AND ADENOIDECTOMY    . TUBAL LIGATION      Family History  Problem Relation Age of Onset  . Cancer Mother        breast  . Diabetes Father   . Hypertension Father   . Heart disease Maternal Grandfather   . Dementia Paternal Grandmother   . Bladder Cancer Neg Hx   . Kidney cancer Neg Hx    Social History:  reports that she has never smoked. She has never used smokeless tobacco. She reports current alcohol use. She reports that she does not use drugs.  Allergies:  Allergies  Allergen Reactions  . Penicillins Rash    Has patient had a PCN reaction causing immediate rash, facial/tongue/throat swelling, SOB or lightheadedness with hypotension: Yes Has patient had a PCN reaction causing severe rash involving mucus membranes or skin necrosis: Yes Has patient had a PCN reaction that required hospitalization: No Has patient had a PCN reaction occurring within the last 10 years: Yes If all of the above answers are "NO", then may proceed with Cephalosporin use.     No medications prior to admission.    Results for orders placed or performed during the hospital encounter of 02/08/18 (from the past 48 hour(s))  CBC     Status: Abnormal   Collection Time: 02/08/18  8:54 PM  Result Value Ref Range  WBC 8.3 4.0 - 10.5 K/uL   RBC 5.02 3.87 - 5.11 MIL/uL   Hemoglobin 12.4 12.0 - 15.0 g/dL   HCT 39.3 36.0 - 46.0 %   MCV 78.3 (L) 80.0 - 100.0 fL   MCH 24.7 (L) 26.0 - 34.0 pg   MCHC 31.6 30.0 - 36.0 g/dL   RDW 14.5 11.5 - 15.5 %   Platelets 436 (H) 150 - 400 K/uL   nRBC 0.0 0.0 - 0.2 %    Comment: Performed at [facility], [address] [place], [place] [address]  Comprehensive metabolic panel     Status: Abnormal   Collection Time: 02/08/18  8:54 PM  Result Value Ref Range   Sodium 137 135 - 145 mmol/L   Potassium 3.7 3.5 - 5.1 mmol/L   Chloride 104 98 - 111 mmol/L   CO2 26 22  - 32 mmol/L   Glucose, Bld 114 (H) 70 - 99 mg/dL   BUN 11 6 - 20 mg/dL   Creatinine, Ser 0.58 0.44 - 1.00 mg/dL   Calcium 9.1 8.9 - 10.3 mg/dL   Total Protein 7.7 6.5 - 8.1 g/dL   Albumin 3.5 3.5 - 5.0 g/dL   AST 20 15 - 41 U/L   ALT 18 0 - 44 U/L   Alkaline Phosphatase 72 38 - 126 U/L   Total Bilirubin 0.4 0.3 - 1.2 mg/dL   GFR calc non Af Amer >60 >60 mL/min   GFR calc Af Amer >60 >60 mL/min   Anion gap 7 5 - 15    Comment: Performed at [facility], [address] [place], [place] [address]  Troponin I - ONCE - STAT     Status: None   Collection Time: 02/08/18  8:54 PM  Result Value Ref Range   Troponin I <0.03 <0.03 ng/mL    Comment: Performed at [facility], [address] [place], [place] [address]  Protime-INR     Status: None   Collection Time: 02/08/18  8:54 PM  Result Value Ref Range   Prothrombin Time 12.2 11.4 - 15.2 seconds   INR 0.91     Comment: Performed at [facility], [address] [place], [place] [address]   Ct Angio Chest Pe W And/or Wo Contrast  Result Date: 02/09/2018 CLINICAL DATA:  Chest pain, upper extremity thrombus and family history of clotting disorder. EXAM: CT ANGIOGRAPHY CHEST WITH CONTRAST TECHNIQUE: Multidetector CT imaging of the chest was performed using the standard protocol during bolus administration of intravenous contrast. Multiplanar CT image reconstructions and MIPs were obtained to evaluate the vascular anatomy. CONTRAST:  132m ISOVUE-370 IOPAMIDOL (ISOVUE-370) INJECTION 76% COMPARISON:  None. FINDINGS: Cardiovascular: --Pulmonary arteries: Satisfactory contrast bolus. Beam attenuation caused by patient body habitus limits assessment beyond the proximal segmental level.Within that limitation, no proximal pulmonary embolus. The main pulmonary artery is within normal limits for size. --Aorta: Satisfactory opacification of the thoracic aorta. No aortic dissection or other acute aortic syndrome. Conventional 3  vessel aortic branching pattern. The aortic course and caliber are normal. There is no aortic atherosclerosis. --Heart: Normal size. No pericardial effusion. Mediastinum/Nodes: No mediastinal, hilar or axillary lymphadenopathy. The visualized thyroid and thoracic esophageal course are unremarkable. Lungs/Pleura: No pulmonary nodules or masses. No pleural effusion or pneumothorax. No focal airspace consolidation. No focal pleural abnormality. Upper Abdomen: Contrast bolus timing is not optimized for evaluation of the abdominal organs. Within this limitation, the visualized organs of the upper abdomen are normal. Musculoskeletal: No chest  wall abnormality. No acute or significant osseous findings. Review of the MIP images confirms the above findings. IMPRESSION: Reduced sensitivity secondary to beam attenuation caused by body habitus. Within that limitation, no proximal pulmonary embolus or other acute thoracic abnormality. Electronically Signed   By: Ulyses Jarred M.D.   On: 02/09/2018 00:53   US Venous Img Upper Uni Right  Result Date: 02/09/2018 CLINICAL DATA:  Subacute onset of right upper extremity pain and swelling. Recently diagnosed with thrombophlebitis. EXAM: RIGHT UPPER EXTREMITY VENOUS DOPPLER ULTRASOUND TECHNIQUE: Gray-scale sonography with graded compression, as well as color Doppler and duplex ultrasound were performed to evaluate the upper extremity deep venous system from the level of the subclavian vein and including the jugular, axillary, basilic, radial, ulnar and upper cephalic vein. Spectral Doppler was utilized to evaluate flow at rest and with distal augmentation maneuvers. COMPARISON:  None. FINDINGS: Contralateral Subclavian Vein: Respiratory phasicity is normal and symmetric with the symptomatic side. No evidence of thrombus. Normal compressibility. Internal Jugular Vein: No evidence of thrombus. Normal compressibility, respiratory phasicity and response to augmentation. Subclavian  Vein: No evidence of thrombus. Normal compressibility, respiratory phasicity and response to augmentation. Axillary Vein: No evidence of thrombus. Normal compressibility, respiratory phasicity and response to augmentation. Cephalic Vein: No evidence of thrombus. Normal compressibility, respiratory phasicity and response to augmentation. Basilic Vein: Occlusive thrombus is noted within the basilic vein. Brachial Veins: Occlusive thrombus is noted within one of the patient's paired brachial veins. Radial Veins: No evidence of thrombus. Normal compressibility, respiratory phasicity and response to augmentation. Ulnar Veins: No evidence of thrombus. Normal compressibility, respiratory phasicity and response to augmentation. Venous Reflux:  None visualized. Other Findings:  None visualized. IMPRESSION: Occlusive thrombus noted in the basilic vein and one of the patient's brachial veins. These results were called by telephone at the time of interpretation on 02/09/2018 at 12:49 am to Dr. Marjean Donna, who verbally acknowledged these results. Electronically Signed   By: Garald Balding M.D.   On: 02/09/2018 00:51    Review of Systems  Constitutional: Negative for chills and fever.  HENT: Negative for sore throat and tinnitus.   Eyes: Negative for blurred vision and redness.  Respiratory: Negative for cough and shortness of breath.   Cardiovascular: Positive for chest pain. Negative for palpitations, orthopnea and PND.  Gastrointestinal: Negative for abdominal pain, diarrhea, nausea and vomiting.  Genitourinary: Negative for dysuria, frequency and urgency.  Musculoskeletal: Negative for joint pain and myalgias.  Skin: Negative for rash.       No lesions  Neurological: Negative for speech change, focal weakness and weakness.  Endo/Heme/Allergies: Does not bruise/bleed easily.       No temperature intolerance  Psychiatric/Behavioral: Negative for depression and suicidal ideas.    Blood pressure (!)  118/55, pulse 78, temperature 98.2 F (36.8 C), temperature source Oral, resp. rate 20, height _0  (1.651 m), weight (!) 144.3 kg, last menstrual period 02/02/2018, SpO2 96 %. Physical Exam  Vitals reviewed. Constitutional: She is oriented to person, place, and time. She appears well-developed and well-nourished. No distress.  HENT:  Head: Normocephalic and atraumatic.  Mouth/Throat: Oropharynx is clear and moist.  Eyes: Pupils are equal, round, and reactive to light. Conjunctivae and EOM are normal. No scleral icterus.  Neck: Normal range of motion. Neck supple. No JVD present. No tracheal deviation present. No thyromegaly present.  Cardiovascular: Normal rate, regular rhythm and normal heart sounds. Exam reveals no gallop and no friction rub.  No murmur heard. Respiratory: Effort normal  and breath sounds normal.  GI: Soft. Bowel sounds are normal. She exhibits no distension. There is no abdominal tenderness.  Genitourinary:    Genitourinary Comments: Deferred   Musculoskeletal: Normal range of motion.        General: No edema.  Lymphadenopathy:    She has no cervical adenopathy.  Neurological: She is alert and oriented to person, place, and time. No cranial nerve deficit. She exhibits normal muscle tone.  Skin: Skin is warm and dry. No rash noted. No erythema.  Psychiatric: She has a normal mood and affect. Her behavior is normal. Judgment and thought content normal.     Assessment/Plan This is a 32 year old female admitted for DVT of the upper extremity. 1.  DVT: Occlusive; right basilic vein.  Started on therapeutic Lovenox.  I have ordered PT/INR.  She has a history of unprovoked DVT.  Will consult hematology for guidance on diagnostic pathway for clotting as well as recommendations for oral anticoagulation. 2.  Morbid obesity: BMI is 52.9; encouraged healthy diet and exercise. 3.  DVT prophylaxis: Therapeutic anticoagulation 4.  GI prophylaxis: None The patient is a full code.   Time spent on admission orders and patient care approximately 45 minutes  Harrie Foreman, MD 02/09/2018, 8:28 AM

## 2018-02-09 NOTE — ED Notes (Signed)
Ultrasound in progress  

## 2018-02-09 NOTE — Progress Notes (Signed)
[facility] [place] NAME: Diana Sexton    MR#:  [id number]  DATE OF BIRTH:  March 17, 1985  SUBJECTIVE:  CHIEF COMPLAINT:   Chief Complaint  Patient presents with  . Chest Pain  Patient seen and evaluated today No complaints of any chest pain No shortness of breath Has pain in the right forearm and arm  REVIEW OF SYSTEMS:    ROS  CONSTITUTIONAL: No documented fever. No fatigue, weakness. No weight gain, no weight loss.  EYES: No blurry or double vision.  ENT: No tinnitus. No postnasal drip. No redness of the oropharynx.  RESPIRATORY: No cough, no wheeze, no hemoptysis. No dyspnea.  CARDIOVASCULAR: No chest pain. No orthopnea. No palpitations. No syncope.  GASTROINTESTINAL: No nausea, no vomiting or diarrhea. No abdominal pain. No melena or hematochezia.  GENITOURINARY: No dysuria or hematuria.  ENDOCRINE: No polyuria or nocturia. No heat or cold intolerance.  HEMATOLOGY: No anemia. No bruising. No bleeding.  INTEGUMENTARY: No rashes. No lesions.  MUSCULOSKELETAL: No arthritis. No swelling. No gout.  Has pain in right fore arm and arm NEUROLOGIC: No numbness, tingling, or ataxia. No seizure-type activity.  PSYCHIATRIC: No anxiety. No insomnia. No ADD.   DRUG ALLERGIES:   Allergies  Allergen Reactions  . Penicillins Rash    Has patient had a PCN reaction causing immediate rash, facial/tongue/throat swelling, SOB or lightheadedness with hypotension: Yes Has patient had a PCN reaction causing severe rash involving mucus membranes or skin necrosis: Yes Has patient had a PCN reaction that required hospitalization: No Has patient had a PCN reaction occurring within the last 10 years: Yes If all of the above answers are "NO", then may proceed with Cephalosporin use.     VITALS:  Blood pressure (!) 118/55, pulse 78, temperature 98.2 F (36.8 C), temperature source Oral, resp. rate 20, height 5\' 5"  (1.651 m), weight (!) 144.3 kg, last  menstrual period 02/02/2018, SpO2 96 %.  PHYSICAL EXAMINATION:   Physical Exam  GENERAL:  32 y.o.-year-old patient lying in the bed with no acute distress.  EYES: Pupils equal, round, reactive to light and accommodation. No scleral icterus. Extraocular muscles intact.  HEENT: Head atraumatic, normocephalic. Oropharynx and nasopharynx clear.  NECK:  Supple, no jugular venous distention. No thyroid enlargement, no tenderness.  LUNGS: Normal breath sounds bilaterally, no wheezing, rales, rhonchi. No use of accessory muscles of respiration.  CARDIOVASCULAR: S1, S2 normal. No murmurs, rubs, or gallops.  ABDOMEN: Soft, nontender, nondistended. Bowel sounds present. No organomegaly or mass.  EXTREMITIES: No cyanosis, clubbing or edema b/l.    Tenderness right fore arm and arm NEUROLOGIC: Cranial nerves II through XII are intact. No focal Motor or sensory deficits b/l.   PSYCHIATRIC: The patient is alert and oriented x 3.  SKIN: No obvious rash, lesion, or ulcer.   LABORATORY PANEL:   CBC Recent Labs  Lab 02/08/18 2054  WBC 8.3  HGB 12.4  HCT 39.3  PLT 436*   ------------------------------------------------------------------------------------------------------------------ Chemistries  Recent Labs  Lab 02/08/18 2054  NA 137  K 3.7  CL 104  CO2 26  GLUCOSE 114*  BUN 11  CREATININE 0.58  CALCIUM 9.1  AST 20  ALT 18  ALKPHOS 72  BILITOT 0.4   ------------------------------------------------------------------------------------------------------------------  Cardiac Enzymes Recent Labs  Lab 02/08/18 2054  TROPONINI <0.03   ------------------------------------------------------------------------------------------------------------------  RADIOLOGY:  Ct Angio Chest Pe W And/or Wo Contrast  Result Date: 02/09/2018 CLINICAL DATA:  Chest pain, upper extremity thrombus and family  history of clotting disorder. EXAM: CT ANGIOGRAPHY CHEST WITH CONTRAST TECHNIQUE: Multidetector  CT imaging of the chest was performed using the standard protocol during bolus administration of intravenous contrast. Multiplanar CT image reconstructions and MIPs were obtained to evaluate the vascular anatomy. CONTRAST:  132mL ISOVUE-370 IOPAMIDOL (ISOVUE-370) INJECTION 76% COMPARISON:  None. FINDINGS: Cardiovascular: --Pulmonary arteries: Satisfactory contrast bolus. Beam attenuation caused by patient body habitus limits assessment beyond the proximal segmental level.Within that limitation, no proximal pulmonary embolus. The main pulmonary artery is within normal limits for size. --Aorta: Satisfactory opacification of the thoracic aorta. No aortic dissection or other acute aortic syndrome. Conventional 3 vessel aortic branching pattern. The aortic course and caliber are normal. There is no aortic atherosclerosis. --Heart: Normal size. No pericardial effusion. Mediastinum/Nodes: No mediastinal, hilar or axillary lymphadenopathy. The visualized thyroid and thoracic esophageal course are unremarkable. Lungs/Pleura: No pulmonary nodules or masses. No pleural effusion or pneumothorax. No focal airspace consolidation. No focal pleural abnormality. Upper Abdomen: Contrast bolus timing is not optimized for evaluation of the abdominal organs. Within this limitation, the visualized organs of the upper abdomen are normal. Musculoskeletal: No chest wall abnormality. No acute or significant osseous findings. Review of the MIP images confirms the above findings. IMPRESSION: Reduced sensitivity secondary to beam attenuation caused by body habitus. Within that limitation, no proximal pulmonary embolus or other acute thoracic abnormality. Electronically Signed   By: Ulyses Jarred M.D.   On: 02/09/2018 00:53   US Venous Img Upper Uni Right  Result Date: 02/09/2018 CLINICAL DATA:  Subacute onset of right upper extremity pain and swelling. Recently diagnosed with thrombophlebitis. EXAM: RIGHT UPPER EXTREMITY VENOUS DOPPLER  ULTRASOUND TECHNIQUE: Gray-scale sonography with graded compression, as well as color Doppler and duplex ultrasound were performed to evaluate the upper extremity deep venous system from the level of the subclavian vein and including the jugular, axillary, basilic, radial, ulnar and upper cephalic vein. Spectral Doppler was utilized to evaluate flow at rest and with distal augmentation maneuvers. COMPARISON:  None. FINDINGS: Contralateral Subclavian Vein: Respiratory phasicity is normal and symmetric with the symptomatic side. No evidence of thrombus. Normal compressibility. Internal Jugular Vein: No evidence of thrombus. Normal compressibility, respiratory phasicity and response to augmentation. Subclavian Vein: No evidence of thrombus. Normal compressibility, respiratory phasicity and response to augmentation. Axillary Vein: No evidence of thrombus. Normal compressibility, respiratory phasicity and response to augmentation. Cephalic Vein: No evidence of thrombus. Normal compressibility, respiratory phasicity and response to augmentation. Basilic Vein: Occlusive thrombus is noted within the basilic vein. Brachial Veins: Occlusive thrombus is noted within one of the patient's paired brachial veins. Radial Veins: No evidence of thrombus. Normal compressibility, respiratory phasicity and response to augmentation. Ulnar Veins: No evidence of thrombus. Normal compressibility, respiratory phasicity and response to augmentation. Venous Reflux:  None visualized. Other Findings:  None visualized. IMPRESSION: Occlusive thrombus noted in the basilic vein and one of the patient's brachial veins. These results were called by telephone at the time of interpretation on 02/09/2018 at 12:49 am to Dr. Marjean Donna, who verbally acknowledged these results. Electronically Signed   By: Garald Balding M.D.   On: 02/09/2018 00:51     ASSESSMENT AND PLAN:  32 year old female patient with history of DVT, prothrombin mutation,  obesity, anxiety disorder presented to the emergency room for chest discomfort and pain in the right arm and forearm  -Occlusive DVT in right basilic and brachial veins Status post hematology evaluation On anticoagulation with full dose Lovenox subcutaneously Transition to oral Eliquis pharmacy consult  has been placed  -Obesity Encourage diet and exercise  -History of prothrombin mutation Hematology follow-up as outpatient  -DVT prophylaxis On anticoagulation with full dose   All the records are reviewed and case discussed with Care Management/Social Worker. Management plans discussed with the patient, family and they are in agreement.  CODE STATUS: Full code  DVT Prophylaxis: SCDs  TOTAL TIME TAKING CARE OF THIS PATIENT: 45 minutes.   POSSIBLE D/C IN 1 DAYS, DEPENDING ON CLINICAL CONDITION.  Saundra Shelling M.D on 02/09/2018 at 10:58 AM  Between 7am to 6pm - Pager - [phone]  After 6pm go to [email or web address] - password [email or web address] [facility]  Office  [phone]  CC: Primary care physician; Kathrine Haddock, NP  Note: This dictation was prepared with Dragon dictation along with smaller phrase technology. Any transcriptional errors that result from this process are unintentional.

## 2018-02-09 NOTE — ED Notes (Signed)
Report finish att, called for transport

## 2018-02-09 NOTE — ED Notes (Signed)
Patient transported to CT 

## 2018-02-09 NOTE — Progress Notes (Deleted)
Pt admitted from ED with assistance of son. Pt was nonverbal with staff. IVF's continued with plan of care in process. Awaiting stool for collection. MRSA PCR obtained and sent to lab. Pt resting quietly with eyes closed. Pt reports pt normally ambulates with 1+ without equipment.

## 2018-02-09 NOTE — ED Notes (Signed)
Po fluids provided.  

## 2018-02-09 NOTE — Progress Notes (Signed)
Pt reports percocet effective with HA resolves and right arm down down to 3/10.

## 2018-02-10 DIAGNOSIS — I82409 Acute embolism and thrombosis of unspecified deep veins of unspecified lower extremity: Secondary | ICD-10-CM | POA: Diagnosis not present

## 2018-02-10 MED ORDER — APIXABAN 5 MG PO TABS: 5.0000 mg | ORAL_TABLET | Freq: Two times a day (BID) | ORAL | 0 refills | Status: DC

## 2018-02-10 MED ORDER — OXYCODONE-ACETAMINOPHEN 5-325 MG PO TABS: 2.0000 | ORAL_TABLET | Freq: Four times a day (QID) | ORAL | 0 refills | Status: DC | PRN

## 2018-02-10 MED ORDER — APIXABAN 5 MG PO TABS: 10.0000 mg | ORAL_TABLET | Freq: Two times a day (BID) | ORAL | 0 refills | Status: DC

## 2018-02-10 NOTE — Progress Notes (Signed)
Pt discharged home with husband. Discharge instructions explained and scripts given to pt. All questions answered.

## 2018-02-10 NOTE — Discharge Summary (Signed)
[facility] NAME: Diana Sexton    MR#:  [id number]  DATE OF BIRTH:  05/11/1985  DATE OF ADMISSION:  02/08/2018 ADMITTING PHYSICIAN: Harrie Foreman, MD  DATE OF DISCHARGE: 02/10/2018 10:45 AM  PRIMARY CARE PHYSICIAN: Kathrine Haddock, NP   ADMISSION DIAGNOSIS:  Swelling [R60.9] Pain [R52] Acute deep vein thrombosis (DVT) of other vein of right upper extremity (HCC) [I82.621]  DISCHARGE DIAGNOSIS:  DVT brachial and basilic vein right upper extremity Right upper extremity pain History of prothrombin gene mutation  SECONDARY DIAGNOSIS:   Past Medical History:  Diagnosis Date  . Anemia   . Anxiety   . Cellulitis 2012   caused stillbirth  . DVT (deep venous thrombosis) ([place]) 06/2011   under right clavicle  . Dysmenorrhea   . Family history of breast cancer 11/2013   BRCA/MyRisk neg  . Headache    related to hormonal meds  . History of kidney stones   . History of prothrombin mutation   . Increased risk of breast cancer 11/2013   IBIS=27%  . Obesity      ADMITTING HISTORY Patient with past medical history of DVT presents to the emergency department complaining of pain in her right arm that radiates into her chest.  The patient reports being diagnosed with a blood clot in the right arm a few days ago.  She was not started on anticoagulation by the emergency department but told to follow-up with her primary care doctor.  Somehow she was lost to follow-up and now returns with pain and swelling in the arm that radiates into her chest.  She admits to some shortness of breath at times but thinks that it is related to the pain.  CTA of her chest did not show pulmonary embolism.  Notably the patient reports that her father has a history of clotting disorder that she believes to be a prothrombin mutation.  She was started on therapeutic Lovenox in the emergency department prior to the hospitalist service being called for  admission.  HOSPITAL COURSE:  Patient was admitted to medical floor.  Patient was started on anticoagulation with full dose Lovenox subcutaneously.  Hematology consultation was done who recommended starting patient on oral Eliquis for anticoagulation.  Pharmacy consult was placed and patient was transitioned to oral Eliquis for anticoagulation.  Patient's pain in the right upper extremity was controlled with oral narcotic medication.  Patient was worked up with CTA chest which did not show any pulmonary embolism.  Patient will be discharged home on anticoagulation with oral Eliquis and follow-up with hematology in the clinic.  CONSULTS OBTAINED:  Hematology consult  DRUG ALLERGIES:   Allergies  Allergen Reactions  . Penicillins Rash    Has patient had a PCN reaction causing immediate rash, facial/tongue/throat swelling, SOB or lightheadedness with hypotension: Yes Has patient had a PCN reaction causing severe rash involving mucus membranes or skin necrosis: Yes Has patient had a PCN reaction that required hospitalization: No Has patient had a PCN reaction occurring within the last 10 years: Yes If all of the above answers are "NO", then may proceed with Cephalosporin use.     DISCHARGE MEDICATIONS:   Allergies as of 02/10/2018      Reactions   Penicillins Rash   Has patient had a PCN reaction causing immediate rash, facial/tongue/throat swelling, SOB or lightheadedness with hypotension: Yes Has patient had a PCN reaction causing severe rash involving mucus membranes or skin necrosis: Yes Has patient  had a PCN reaction that required hospitalization: No Has patient had a PCN reaction occurring within the last 10 years: Yes If all of the above answers are "NO", then may proceed with Cephalosporin use.      Medication List    TAKE these medications   apixaban 5 MG Tabs tablet Commonly known as:  ELIQUIS Take 2 tablets (10 mg total) by mouth 2 (two) times daily for 11 doses.    apixaban 5 MG Tabs tablet Commonly known as:  ELIQUIS Take 1 tablet (5 mg total) by mouth 2 (two) times daily. Start taking on:  February 16, 2018   oxyCODONE-acetaminophen 5-325 MG tablet Commonly known as:  PERCOCET/ROXICET Take 2 tablets by mouth every 6 (six) hours as needed for moderate pain or severe pain.       Today  Patient seen and evaluated on the day of discharge Decreased pain in the right upper extremity No fever No chest pain No shortness of breath Patient started on oral Eliquis Patient hemodynamically stable will be discharged home  VITAL SIGNS:  Blood pressure 107/78, pulse 77, temperature (!) 97.5 F (36.4 C), temperature source Oral, resp. rate 18, height 5' 5"  (1.651 m), weight (!) 144.3 kg, last menstrual period 02/02/2018, SpO2 100 %.  I/O:  No intake or output data in the 24 hours ending 02/10/18 1054  PHYSICAL EXAMINATION:  Physical Exam  GENERAL:  32 y.o.-year-old patient lying in the bed with no acute distress.  LUNGS: Normal breath sounds bilaterally, no wheezing, rales,rhonchi or crepitation. No use of accessory muscles of respiration.  CARDIOVASCULAR: S1, S2 normal. No murmurs, rubs, or gallops.  ABDOMEN: Soft, non-tender, non-distended. Bowel sounds present. No organomegaly or mass.  NEUROLOGIC: Moves all 4 extremities. PSYCHIATRIC: The patient is alert and oriented x 3.  SKIN: No obvious rash, lesion, or ulcer.  Right upper extremity swelling decreased, tenderness improved  DATA REVIEW:   CBC Recent Labs  Lab 02/08/18 2054  WBC 8.3  HGB 12.4  HCT 39.3  PLT 436*    Chemistries  Recent Labs  Lab 02/08/18 2054  NA 137  K 3.7  CL 104  CO2 26  GLUCOSE 114*  BUN 11  CREATININE 0.58  CALCIUM 9.1  AST 20  ALT 18  ALKPHOS 72  BILITOT 0.4    Cardiac Enzymes Recent Labs  Lab 02/08/18 2054  TROPONINI <0.03    Microbiology Results  Results for orders placed or performed in visit on 02/09/17  Microscopic Examination      Status: Abnormal   Collection Time: 02/09/17 12:58 PM  Result Value Ref Range Status   WBC, UA 6-10 (A) 0 - 5 /hpf Final   RBC, UA >30 (H) 0 - 2 /hpf Final   Epithelial Cells (non renal) 0-10 0 - 10 /hpf Final   Mucus, UA Present (A) Not Estab. Final   Bacteria, UA Moderate (A) None seen/Few Final    RADIOLOGY:  Ct Angio Chest Pe W And/or Wo Contrast  Result Date: 02/09/2018 CLINICAL DATA:  Chest pain, upper extremity thrombus and family history of clotting disorder. EXAM: CT ANGIOGRAPHY CHEST WITH CONTRAST TECHNIQUE: Multidetector CT imaging of the chest was performed using the standard protocol during bolus administration of intravenous contrast. Multiplanar CT image reconstructions and MIPs were obtained to evaluate the vascular anatomy. CONTRAST:  196m ISOVUE-370 IOPAMIDOL (ISOVUE-370) INJECTION 76% COMPARISON:  None. FINDINGS: Cardiovascular: --Pulmonary arteries: Satisfactory contrast bolus. Beam attenuation caused by patient body habitus limits assessment beyond the proximal segmental level.Within  that limitation, no proximal pulmonary embolus. The main pulmonary artery is within normal limits for size. --Aorta: Satisfactory opacification of the thoracic aorta. No aortic dissection or other acute aortic syndrome. Conventional 3 vessel aortic branching pattern. The aortic course and caliber are normal. There is no aortic atherosclerosis. --Heart: Normal size. No pericardial effusion. Mediastinum/Nodes: No mediastinal, hilar or axillary lymphadenopathy. The visualized thyroid and thoracic esophageal course are unremarkable. Lungs/Pleura: No pulmonary nodules or masses. No pleural effusion or pneumothorax. No focal airspace consolidation. No focal pleural abnormality. Upper Abdomen: Contrast bolus timing is not optimized for evaluation of the abdominal organs. Within this limitation, the visualized organs of the upper abdomen are normal. Musculoskeletal: No chest wall abnormality. No acute or  significant osseous findings. Review of the MIP images confirms the above findings. IMPRESSION: Reduced sensitivity secondary to beam attenuation caused by body habitus. Within that limitation, no proximal pulmonary embolus or other acute thoracic abnormality. Electronically Signed   By: Ulyses Jarred M.D.   On: 02/09/2018 00:53   US Venous Img Upper Uni Right  Result Date: 02/09/2018 CLINICAL DATA:  Subacute onset of right upper extremity pain and swelling. Recently diagnosed with thrombophlebitis. EXAM: RIGHT UPPER EXTREMITY VENOUS DOPPLER ULTRASOUND TECHNIQUE: Gray-scale sonography with graded compression, as well as color Doppler and duplex ultrasound were performed to evaluate the upper extremity deep venous system from the level of the subclavian vein and including the jugular, axillary, basilic, radial, ulnar and upper cephalic vein. Spectral Doppler was utilized to evaluate flow at rest and with distal augmentation maneuvers. COMPARISON:  None. FINDINGS: Contralateral Subclavian Vein: Respiratory phasicity is normal and symmetric with the symptomatic side. No evidence of thrombus. Normal compressibility. Internal Jugular Vein: No evidence of thrombus. Normal compressibility, respiratory phasicity and response to augmentation. Subclavian Vein: No evidence of thrombus. Normal compressibility, respiratory phasicity and response to augmentation. Axillary Vein: No evidence of thrombus. Normal compressibility, respiratory phasicity and response to augmentation. Cephalic Vein: No evidence of thrombus. Normal compressibility, respiratory phasicity and response to augmentation. Basilic Vein: Occlusive thrombus is noted within the basilic vein. Brachial Veins: Occlusive thrombus is noted within one of the patient's paired brachial veins. Radial Veins: No evidence of thrombus. Normal compressibility, respiratory phasicity and response to augmentation. Ulnar Veins: No evidence of thrombus. Normal compressibility,  respiratory phasicity and response to augmentation. Venous Reflux:  None visualized. Other Findings:  None visualized. IMPRESSION: Occlusive thrombus noted in the basilic vein and one of the patient's brachial veins. These results were called by telephone at the time of interpretation on 02/09/2018 at 12:49 am to Dr. Marjean Donna, who verbally acknowledged these results. Electronically Signed   By: Garald Balding M.D.   On: 02/09/2018 00:51    Follow up with PCP in 1 week.  Management plans discussed with the patient, family and they are in agreement.  CODE STATUS: Full code    Code Status Orders  (From admission, onward)         Start     Ordered   02/09/18 0320  Full code  Continuous     02/09/18 0319        Code Status History    This patient has a current code status but no historical code status.      TOTAL TIME TAKING CARE OF THIS PATIENT ON DAY OF DISCHARGE: more than 34 minutes.   Saundra Shelling M.D on 02/10/2018 at 10:54 AM  Between 7am to 6pm - Pager - [phone]  After 6pm go to [email or web address] -  password [email or web address]  [facility]  Office  [phone]  CC: Primary care physician; Kathrine Haddock, NP  Note: This dictation was prepared with Dragon dictation along with smaller phrase technology. Any transcriptional errors that result from this process are unintentional.

## 2018-02-10 NOTE — Care Management Note (Signed)
Case Management Note  Patient Details  Name: Diana Sexton MRN: [id number] Date of Birth: 28-Aug-1985  Subjective/Objective:   Eliquis voucher given                   Action/Plan:   Expected Discharge Date:  02/10/18               Expected Discharge Plan:  Home/Self Care  In-House Referral:     Discharge planning Services  CM Consult, Medication Assistance  Post Acute Care Choice:    Choice offered to:     DME Arranged:    DME Agency:     HH Arranged:    [facility] Agency:     Status of Service:  Completed, signed off  If discussed at [employer] of Stay Meetings, dates discussed:    Additional Comments:  Latanya Maudlin, RN 02/10/2018, 9:42 AM

## 2018-02-12 LAB — LUPUS ANTICOAGULANT PANEL
DRVVT: 26.1 s (ref 0.0–47.0)
PTT Lupus Anticoagulant: 33.1 s (ref 0.0–51.9)

## 2018-02-12 LAB — PROTEIN S ACTIVITY: Protein S Activity: 97 % (ref 63–140)

## 2018-02-12 LAB — CARDIOLIPIN ANTIBODIES, IGG, IGM, IGA
Anticardiolipin IgA: <9 APL U/mL (ref 0–11)
Anticardiolipin IgG: <9 GPL U/mL (ref 0–14)
Anticardiolipin IgM: <9 MPL U/mL (ref 0–12)

## 2018-02-12 LAB — PROTEIN S, TOTAL: Protein S Ag, Total: 116 % (ref 60–150)

## 2018-02-12 LAB — PROTEIN C ACTIVITY: Protein C Activity: 119 % (ref 73–180)

## 2018-02-12 LAB — PROTEIN C, TOTAL: Protein C, Total: 98 % (ref 60–150)

## 2018-02-14 LAB — FACTOR 5 LEIDEN

## 2018-02-15 LAB — PROTHROMBIN GENE MUTATION

## 2018-02-20 ENCOUNTER — Encounter (INDEPENDENT_AMBULATORY_CARE_PROVIDER_SITE_OTHER): Payer: BLUE CROSS/BLUE SHIELD | Admitting: Nurse Practitioner

## 2018-02-20 ENCOUNTER — Encounter (INDEPENDENT_AMBULATORY_CARE_PROVIDER_SITE_OTHER): Payer: BLUE CROSS/BLUE SHIELD

## 2018-02-22 IMAGING — US US RENAL
1 series · 14 of 25 positions shown · non-contrast
Comparison: CT, 01/27/2017

CLINICAL DATA: History of a left ureteral stone and left renal
stones.

EXAM:
RENAL / URINARY TRACT ULTRASOUND COMPLETE

[Series 1: us renal · 0.28mm/px · 14 of 39 slices shown]
[im 1/39]
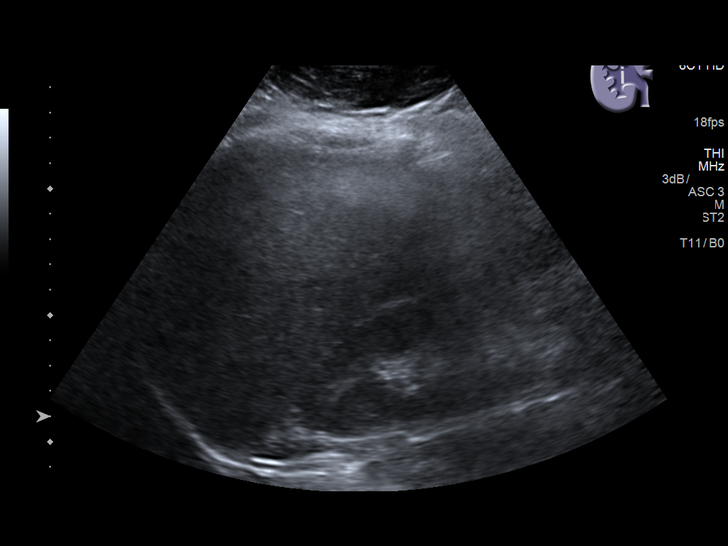
[im 4/39]
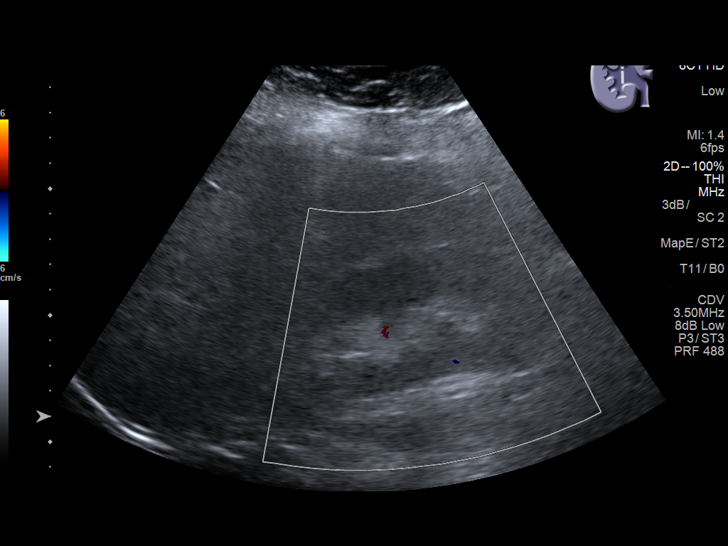
[im 7/39]
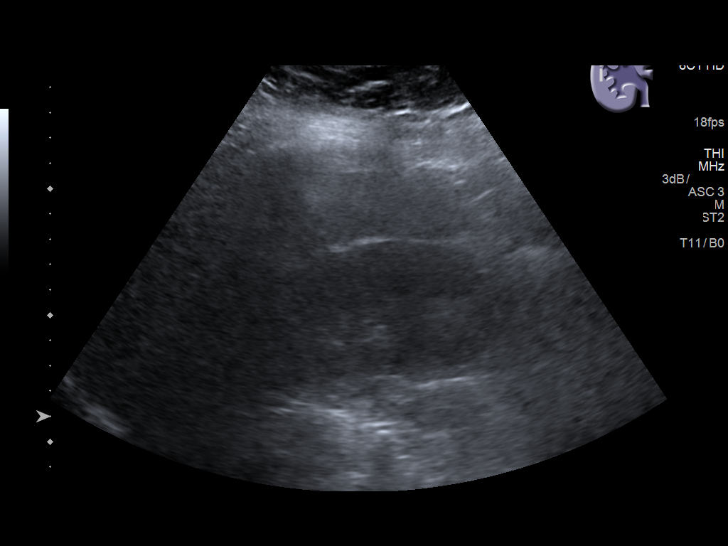
[im 10/39]
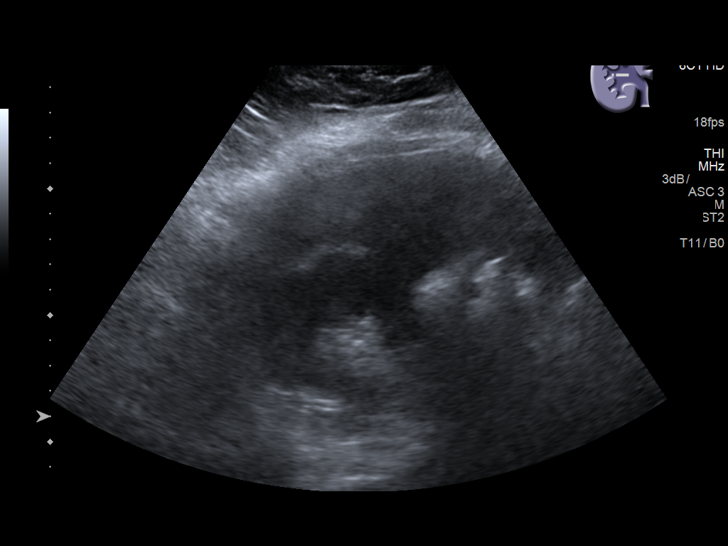
[im 13/39]
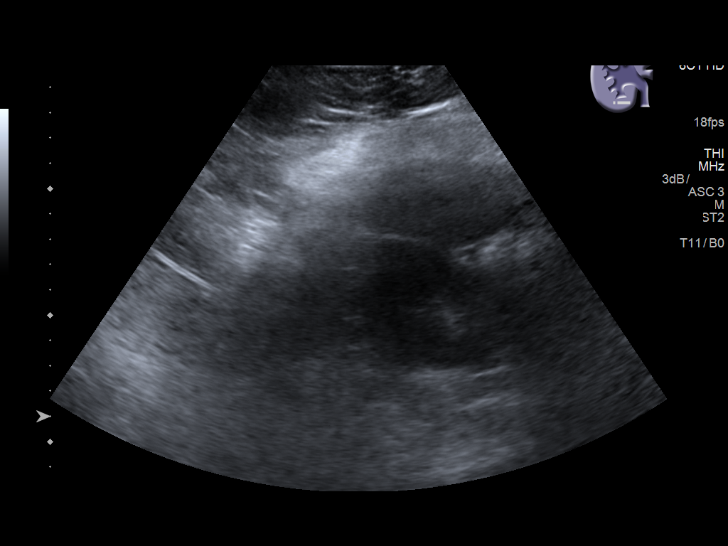
[im 15/39]
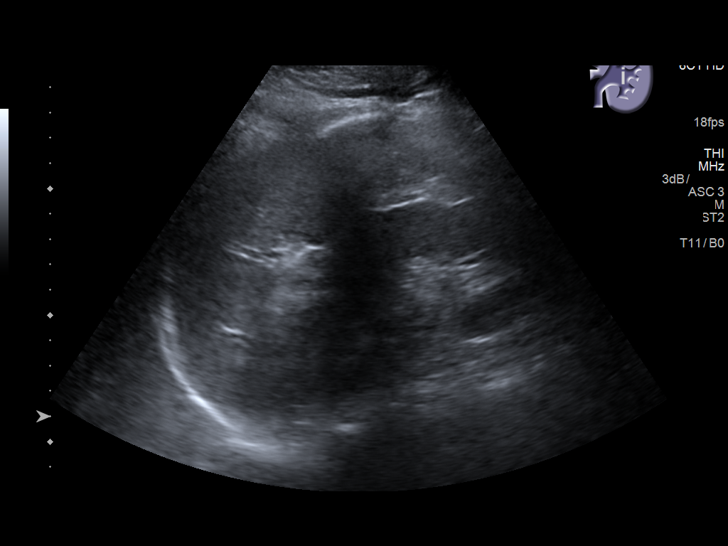
[im 18/39]
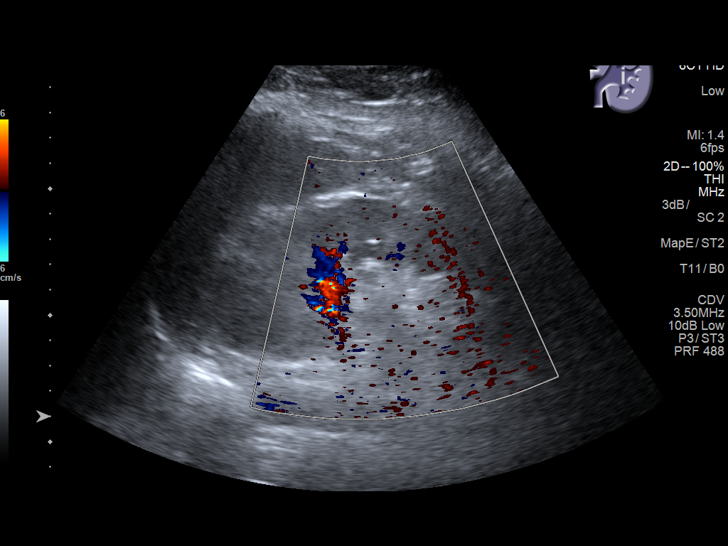
[im 21/39]
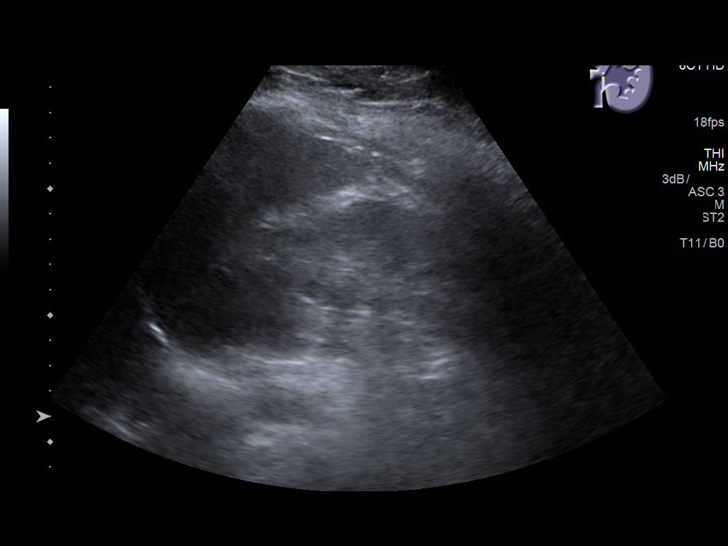
[im 24/39]
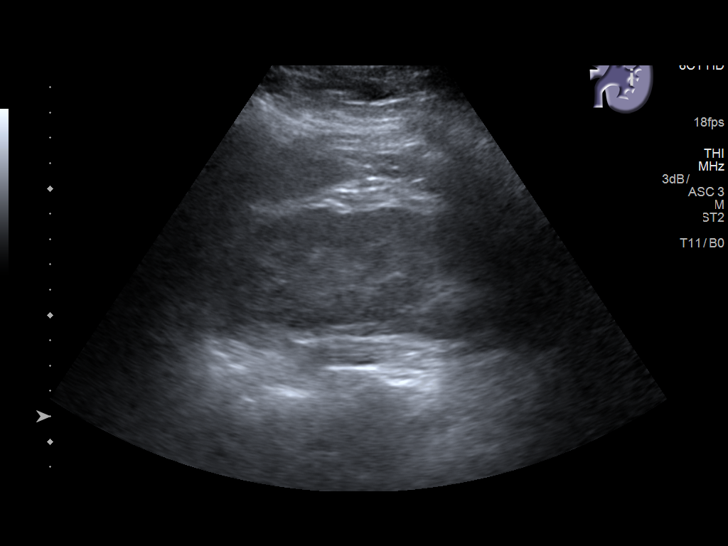
[im 26/39]
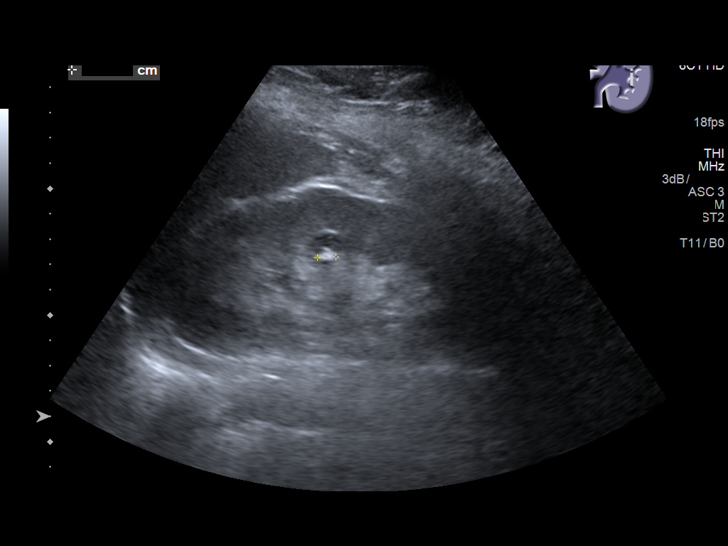
[im 29/39]
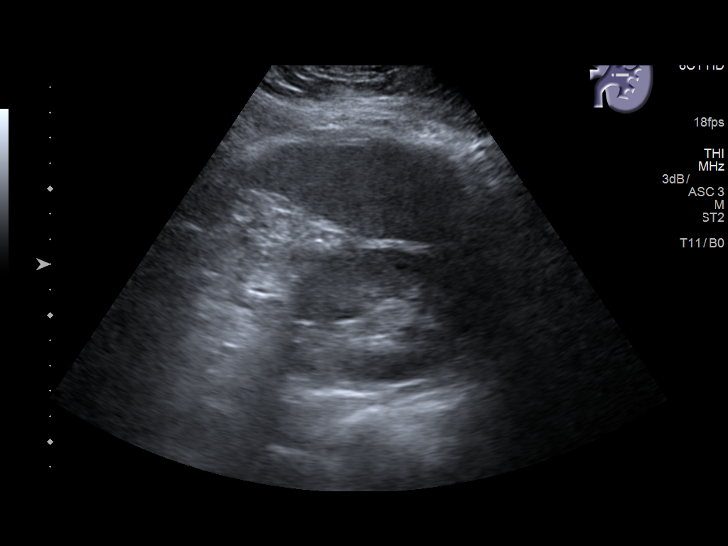
[im 32/39]
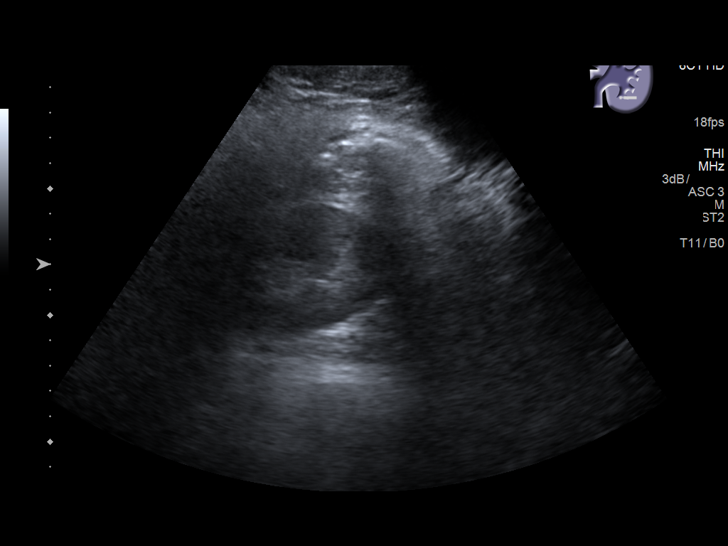
[im 35/39]
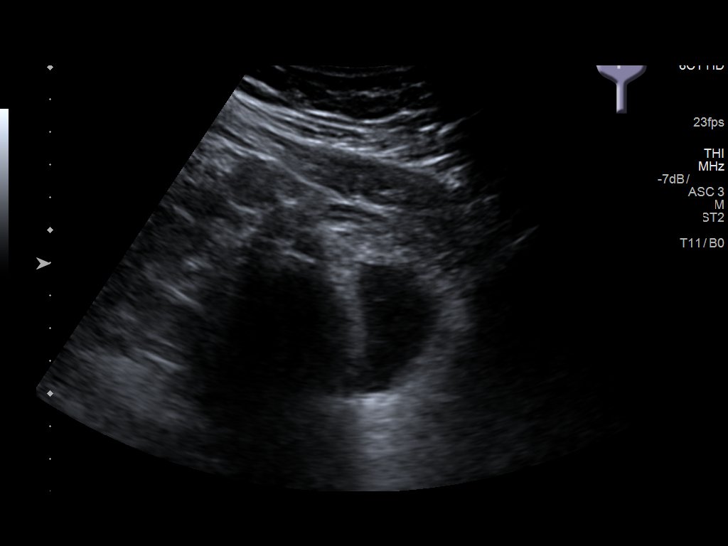
[im 39/39]
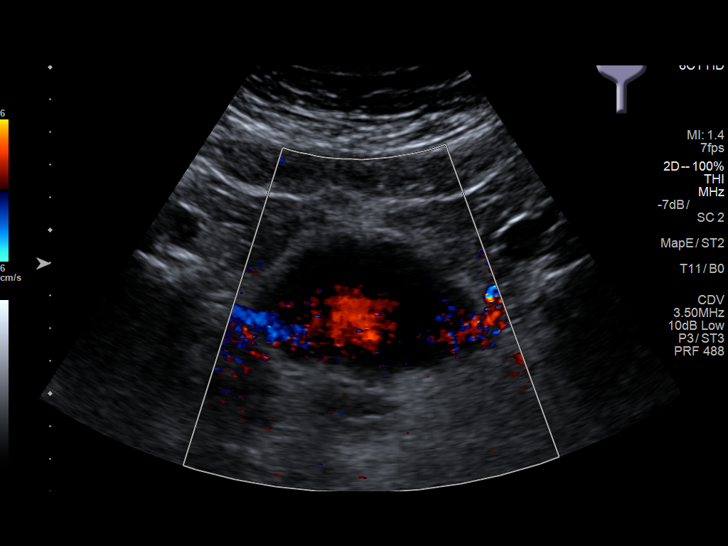

[14 of 25 positions shown; findings below may reference images not displayed]

FINDINGS: Right Kidney:

Length: 13.5 cm. Echogenicity within normal limits. No mass or
hydronephrosis visualized.

Left Kidney:

Length: 13.4 cm. Normal parenchymal echogenicity. There is a 7 mm
nonobstructing stone in the midpole. Other small subtle echogenic
foci other noted which may reflect additional stones. No masses. No
hydronephrosis.

Bladder:

Appears normal for degree of bladder distention.
IMPRESSION: 1. No acute findings.  No hydronephrosis.
2. 7 mm nonobstructing stone in the left kidney. Probable additional
small left intrarenal stones.

## 2018-02-22 NOTE — Progress Notes (Signed)
[place]  [phone] [phone]  ID: ROCSI HAZELBAKER OB: 09/01/1985  MR#: [id number]  WNU#:[id number]  Patient Care Team: Kathrine Haddock, NP as PCP - General (Nurse Practitioner)  CHIEF COMPLAINT: Heterozygous for prothrombin gene mutation.  INTERVAL HISTORY: Patient is a 33 year old female who is recently been to the hospital with acute pulmonary embolism.  She states that she was diagnosed with prothrombin gene mutation after several miscarriages 6 or 7 years ago.  She has a personal history of DVT during pregnancy.  She does not report any transient risk factors for her most recent blood clot only shoulder pain that has now resolved.  She is no neurologic complaints.  She denies any recent fevers or illnesses.  She has a good appetite and denies weight loss.  She has no chest pain, shortness of breath, cough, or hemoptysis.  She has no nausea, vomiting, constipation, or diarrhea.  She has no urinary complaints.  Patient feels at her baseline offers no specific complaints today.  REVIEW OF SYSTEMS:   Review of Systems  Constitutional: Negative.  Negative for fever, malaise/fatigue and weight loss.  Respiratory: Negative.  Negative for cough and shortness of breath.   Cardiovascular: Negative.  Negative for chest pain.  Gastrointestinal: Negative.  Negative for abdominal pain, blood in stool and melena.  Genitourinary: Negative.  Negative for hematuria.  Skin: Negative.  Negative for rash.  Neurological: Negative.  Negative for focal weakness, weakness and headaches.  Psychiatric/Behavioral: Negative.  The patient is not nervous/anxious.     As per HPI. Otherwise, a complete review of systems is negative.  PAST MEDICAL HISTORY: Past Medical History:  Diagnosis Date  . Anemia   . Anxiety   . Cellulitis 2012   caused stillbirth  . DVT (deep venous thrombosis) ([place]) 06/2011   under right clavicle  . Dysmenorrhea   . Family history of  breast cancer 11/2013   BRCA/MyRisk neg  . Headache    related to hormonal meds  . History of kidney stones   . History of prothrombin mutation   . Increased risk of breast cancer 11/2013   IBIS=27%  . Obesity     PAST SURGICAL HISTORY: Past Surgical History:  Procedure Laterality Date  . CHOLECYSTECTOMY    . CYSTOSCOPY W/ RETROGRADES Left 02/02/2017   Procedure: CYSTOSCOPY WITH RETROGRADE PYELOGRAM;  Surgeon: Abbie Sons, MD;  Location: [facility];  Service: Urology;  Laterality: Left;  . CYSTOSCOPY/URETEROSCOPY/HOLMIUM LASER/STENT PLACEMENT Left 02/02/2017   Procedure: CYSTOSCOPY/URETEROSCOPY/HOLMIUM LASER/STENT PLACEMENT;  Surgeon: Abbie Sons, MD;  Location: [facility];  Service: Urology;  Laterality: Left;  . DILITATION & CURRETTAGE/HYSTROSCOPY WITH ESSURE    . INTRAUTERINE DEVICE (IUD) INSERTION    . LITHOTRIPSY    . TONSILLECTOMY    . TONSILLECTOMY AND ADENOIDECTOMY    . TUBAL LIGATION      FAMILY HISTORY: Family History  Problem Relation Age of Onset  . Cancer Mother        breast  . Diabetes Father   . Hypertension Father   . Heart disease Maternal Grandfather   . Dementia Paternal Grandmother   . Bladder Cancer Neg Hx   . Kidney cancer Neg Hx     ADVANCED DIRECTIVES (Y/N):  N  HEALTH MAINTENANCE: Social History   Tobacco Use  . Smoking status: Never Smoker  . Smokeless tobacco: Never Used  Substance Use Topics  . Alcohol use: Yes    Comment: 1 drink per month  . Drug use:  No     Colonoscopy:  PAP:  Bone density:  Lipid panel:  Allergies  Allergen Reactions  . Penicillins Rash    Has patient had a PCN reaction causing immediate rash, facial/tongue/throat swelling, SOB or lightheadedness with hypotension: Yes Has patient had a PCN reaction causing severe rash involving mucus membranes or skin necrosis: Yes Has patient had a PCN reaction that required hospitalization: No Has patient had a PCN reaction occurring within the last 10 years:  Yes If all of the above answers are "NO", then may proceed with Cephalosporin use.     Current Outpatient Medications  Medication Sig Dispense Refill  . apixaban (ELIQUIS) 5 MG TABS tablet Take 1 tablet (5 mg total) by mouth 2 (two) times daily. 60 tablet 6   No current facility-administered medications for this visit.     OBJECTIVE: Vitals:   02/25/18 1031  BP: (!) 142/83  Pulse: 69  Temp: (!) 97.1 F (36.2 C)     Body mass index is 52.73 kg/m.    ECOG FS:0 - Asymptomatic  General: Well-developed, well-nourished, no acute distress. Eyes: Pink conjunctiva, anicteric sclera. HEENT: Normocephalic, moist mucous membranes, clear oropharnyx. Lungs: Clear to auscultation bilaterally. Heart: Regular rate and rhythm. No rubs, murmurs, or gallops. Abdomen: Soft, nontender, nondistended. No organomegaly noted, normoactive bowel sounds. Musculoskeletal: No edema, cyanosis, or clubbing. Neuro: Alert, answering all questions appropriately. Cranial nerves grossly intact. Skin: No rashes or petechiae noted. Psych: Normal affect. Lymphatics: No cervical, calvicular, axillary or inguinal LAD.   LAB RESULTS:  Lab Results  Component Value Date   NA 137 02/08/2018   K 3.7 02/08/2018   CL 104 02/08/2018   CO2 26 02/08/2018   GLUCOSE 114 (H) 02/08/2018   BUN 11 02/08/2018   CREATININE 0.58 02/08/2018   CALCIUM 9.1 02/08/2018   PROT 7.7 02/08/2018   ALBUMIN 3.5 02/08/2018   AST 20 02/08/2018   ALT 18 02/08/2018   ALKPHOS 72 02/08/2018   BILITOT 0.4 02/08/2018   GFRNONAA >60 02/08/2018   GFRAA >60 02/08/2018    Lab Results  Component Value Date   WBC 8.3 02/08/2018   NEUTROABS 5.9 01/27/2017   HGB 12.4 02/08/2018   HCT 39.3 02/08/2018   MCV 78.3 (L) 02/08/2018   PLT 436 (H) 02/08/2018     STUDIES: Dg Chest 2 View  Result Date: 02/04/2018 CLINICAL DATA:  Shortness of breath EXAM: CHEST - 2 VIEW COMPARISON:  None. FINDINGS: Lungs are clear. The heart size and pulmonary  vascularity are normal. No adenopathy. No bone lesions. IMPRESSION: No edema or consolidation. Electronically Signed   By: Lowella Grip III M.D.   On: 02/04/2018 13:18   Ct Angio Chest Pe W And/or Wo Contrast  Result Date: 02/09/2018 CLINICAL DATA:  Chest pain, upper extremity thrombus and family history of clotting disorder. EXAM: CT ANGIOGRAPHY CHEST WITH CONTRAST TECHNIQUE: Multidetector CT imaging of the chest was performed using the standard protocol during bolus administration of intravenous contrast. Multiplanar CT image reconstructions and MIPs were obtained to evaluate the vascular anatomy. CONTRAST:  129m ISOVUE-370 IOPAMIDOL (ISOVUE-370) INJECTION 76% COMPARISON:  None. FINDINGS: Cardiovascular: --Pulmonary arteries: Satisfactory contrast bolus. Beam attenuation caused by patient body habitus limits assessment beyond the proximal segmental level.Within that limitation, no proximal pulmonary embolus. The main pulmonary artery is within normal limits for size. --Aorta: Satisfactory opacification of the thoracic aorta. No aortic dissection or other acute aortic syndrome. Conventional 3 vessel aortic branching pattern. The aortic course and caliber are normal. There  is no aortic atherosclerosis. --Heart: Normal size. No pericardial effusion. Mediastinum/Nodes: No mediastinal, hilar or axillary lymphadenopathy. The visualized thyroid and thoracic esophageal course are unremarkable. Lungs/Pleura: No pulmonary nodules or masses. No pleural effusion or pneumothorax. No focal airspace consolidation. No focal pleural abnormality. Upper Abdomen: Contrast bolus timing is not optimized for evaluation of the abdominal organs. Within this limitation, the visualized organs of the upper abdomen are normal. Musculoskeletal: No chest wall abnormality. No acute or significant osseous findings. Review of the MIP images confirms the above findings. IMPRESSION: Reduced sensitivity secondary to beam attenuation caused  by body habitus. Within that limitation, no proximal pulmonary embolus or other acute thoracic abnormality. Electronically Signed   By: Ulyses Jarred M.D.   On: 02/09/2018 00:53   US Venous Img Upper Uni Right  Result Date: 02/09/2018 CLINICAL DATA:  Subacute onset of right upper extremity pain and swelling. Recently diagnosed with thrombophlebitis. EXAM: RIGHT UPPER EXTREMITY VENOUS DOPPLER ULTRASOUND TECHNIQUE: Gray-scale sonography with graded compression, as well as color Doppler and duplex ultrasound were performed to evaluate the upper extremity deep venous system from the level of the subclavian vein and including the jugular, axillary, basilic, radial, ulnar and upper cephalic vein. Spectral Doppler was utilized to evaluate flow at rest and with distal augmentation maneuvers. COMPARISON:  None. FINDINGS: Contralateral Subclavian Vein: Respiratory phasicity is normal and symmetric with the symptomatic side. No evidence of thrombus. Normal compressibility. Internal Jugular Vein: No evidence of thrombus. Normal compressibility, respiratory phasicity and response to augmentation. Subclavian Vein: No evidence of thrombus. Normal compressibility, respiratory phasicity and response to augmentation. Axillary Vein: No evidence of thrombus. Normal compressibility, respiratory phasicity and response to augmentation. Cephalic Vein: No evidence of thrombus. Normal compressibility, respiratory phasicity and response to augmentation. Basilic Vein: Occlusive thrombus is noted within the basilic vein. Brachial Veins: Occlusive thrombus is noted within one of the patient's paired brachial veins. Radial Veins: No evidence of thrombus. Normal compressibility, respiratory phasicity and response to augmentation. Ulnar Veins: No evidence of thrombus. Normal compressibility, respiratory phasicity and response to augmentation. Venous Reflux:  None visualized. Other Findings:  None visualized. IMPRESSION: Occlusive thrombus  noted in the basilic vein and one of the patient's brachial veins. These results were called by telephone at the time of interpretation on 02/09/2018 at 12:49 am to Dr. Marjean Donna, who verbally acknowledged these results. Electronically Signed   By: Garald Balding M.D.   On: 02/09/2018 00:51   US Venous Img Upper Uni Right  Result Date: 02/04/2018 CLINICAL DATA:  Right forearm pain, history prothrombin gene mutation and history of right arm DVT in 2013 with occlusive subclavian and brachial thrombus and nonocclusive axillary vein thrombus at that time. EXAM: RIGHT UPPER EXTREMITY VENOUS DOPPLER ULTRASOUND TECHNIQUE: Gray-scale sonography with graded compression, as well as color Doppler and duplex ultrasound were performed to evaluate the upper extremity deep venous system from the level of the subclavian vein and including the jugular, axillary, basilic, radial, ulnar and upper cephalic vein. Spectral Doppler was utilized to evaluate flow at rest and with distal augmentation maneuvers. COMPARISON:  07/10/2011 FINDINGS: Contralateral Subclavian Vein: Respiratory phasicity is normal and symmetric with the symptomatic side. No evidence of thrombus. Normal compressibility. Internal Jugular Vein: No evidence of thrombus. Normal compressibility, respiratory phasicity and response to augmentation. 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 Axillary Vein: No evidence of thrombus. Normal compressibility, respiratory phasicity and response to augmentation. Cephalic Vein:  No evidence of thrombus. Normal compressibility, respiratory phasicity and response to augmentation. Basilic Vein: Positive for superficial thrombophlebitis in the basilic vein beginning in the upper arm and extending into the mid forearm. This thrombus has an acute/subacute appearance. Brachial Veins: No evidence of thrombus. Normal compressibility,  respiratory phasicity and response to augmentation. Radial Veins: No evidence of thrombus. Normal compressibility, respiratory phasicity and response to augmentation. Ulnar Veins: No evidence of thrombus. Normal compressibility, respiratory phasicity and response to augmentation. Venous Reflux:  None visualized. Other Findings:  No abnormal fluid collections identified. IMPRESSION: 1. Positive for acute/subacute appearing superficial thrombophlebitis of the right basilic vein in the upper arm and extending across the antecubital fossa to the level of the mid forearm. 2. No evidence of acute appearing deep venous thrombosis. 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 Electronically Signed   By: Aletta Edouard M.D.   On: 02/04/2018 13:31    ASSESSMENT: Heterozygous for prothrombin gene mutation  PLAN:    1. Heterozygous for prothrombin gene mutation: The remainder of her hypercoagulable work-up was negative.  Patient is at approximately 2-3 times risk of developing pulmonary embolism and DVT over the general population.  This is now her second blood clot, although first without inciting factor.  Her initial blood clot occurred during pregnancy which is a known risk factor.  After lengthy discussion, patient expressed understanding of the risks and benefits of continuing lifelong anticoagulation and is not ready to make that decision.  She will require a minimum of 6 months of Eliquis.  Patient will return to clinic in June 2020 to decide whether or not to continue anticoagulation lifelong.  No further interventions are needed.  I spent a total of 60 minutes face-to-face with the patient of which greater than 50% of the visit was spent in counseling and coordination of care as detailed above.   Patient expressed understanding and was in agreement with this plan. She also understands that She can call clinic at  any time with any questions, concerns, or complaints.    Lloyd Huger, MD   02/25/2018 6:20 PM

## 2018-02-25 ENCOUNTER — Inpatient Hospital Stay: Payer: BLUE CROSS/BLUE SHIELD | Attending: Oncology | Admitting: Oncology

## 2018-02-25 ENCOUNTER — Other Ambulatory Visit: Payer: Self-pay

## 2018-02-25 VITALS — BP 142/83 | HR 69 | Temp 97.1°F | Ht 65.0 in | Wt 316.9 lb

## 2018-02-25 DIAGNOSIS — I2699 Other pulmonary embolism without acute cor pulmonale: Secondary | ICD-10-CM | POA: Diagnosis not present

## 2018-02-25 DIAGNOSIS — D6852 Prothrombin gene mutation: Secondary | ICD-10-CM | POA: Insufficient documentation

## 2018-02-25 DIAGNOSIS — I82619 Acute embolism and thrombosis of superficial veins of unspecified upper extremity: Secondary | ICD-10-CM | POA: Insufficient documentation

## 2018-02-25 DIAGNOSIS — I82629 Acute embolism and thrombosis of deep veins of unspecified upper extremity: Secondary | ICD-10-CM | POA: Diagnosis not present

## 2018-02-25 MED ORDER — APIXABAN 5 MG PO TABS: 5.0000 mg | ORAL_TABLET | Freq: Two times a day (BID) | ORAL | 6 refills | Status: DC

## 2018-02-25 NOTE — Progress Notes (Signed)
Patient is here today to establish care for acute embolism and thrombosis of unspecified deep veins of lower extremity. Patient stated that she has had this before six years ago and was not told what was the cause. Patient denied fever, chills, nausea, vomiting, diarrhea or constipation. Patient stated that she started to have headaches after she started taking Eliquis. Patient stated that she gets them once or twice a week, specially when she bends over and gets back up.

## 2018-02-26 ENCOUNTER — Encounter: Payer: BLUE CROSS/BLUE SHIELD | Admitting: Oncology

## 2018-04-04 ENCOUNTER — Encounter: Payer: Self-pay | Admitting: Oncology

## 2018-05-02 ENCOUNTER — Emergency Department
Admission: EM | Admit: 2018-05-02 | Discharge: 2018-05-02 | Disposition: A | Discharge status: Home / Self Care | Payer: BLUE CROSS/BLUE SHIELD | Attending: Emergency Medicine | Admitting: Emergency Medicine

## 2018-05-02 ENCOUNTER — Emergency Department: Payer: BLUE CROSS/BLUE SHIELD

## 2018-05-02 ENCOUNTER — Other Ambulatory Visit: Payer: Self-pay

## 2018-05-02 ENCOUNTER — Encounter: Payer: Self-pay | Admitting: Emergency Medicine

## 2018-05-02 DIAGNOSIS — R079 Chest pain, unspecified: Secondary | ICD-10-CM | POA: Diagnosis not present

## 2018-05-02 DIAGNOSIS — R0602 Shortness of breath: Secondary | ICD-10-CM | POA: Diagnosis not present

## 2018-05-02 DIAGNOSIS — R0789 Other chest pain: Secondary | ICD-10-CM | POA: Diagnosis not present

## 2018-05-02 LAB — BASIC METABOLIC PANEL
Anion gap: 10 (ref 5–15)
BUN: 10 mg/dL (ref 6–20)
CALCIUM: 9.1 mg/dL (ref 8.9–10.3)
CO2: 25 mmol/L (ref 22–32)
Chloride: 104 mmol/L (ref 98–111)
Creatinine, Ser: 0.59 mg/dL (ref 0.44–1.00)
GFR calc Af Amer: >60 mL/min (ref >60)
GFR calc non Af Amer: >60 mL/min (ref >60)
GLUCOSE: 119 mg/dL — AB (ref 70–99)
Potassium: 3.9 mmol/L (ref 3.5–5.1)
Sodium: 139 mmol/L (ref 135–145)

## 2018-05-02 LAB — POCT PREGNANCY, URINE: Preg Test, Ur: NEGATIVE

## 2018-05-02 LAB — TROPONIN I

## 2018-05-02 LAB — CBC
HCT: 40.2 % (ref 36.0–46.0)
Hemoglobin: 12.7 g/dL (ref 12.0–15.0)
MCH: 24.1 pg — ABNORMAL LOW (ref 26.0–34.0)
MCHC: 31.6 g/dL (ref 30.0–36.0)
MCV: 76.4 fL — ABNORMAL LOW (ref 80.0–100.0)
PLATELETS: 400 10*3/uL (ref 150–400)
RBC: 5.26 MIL/uL — ABNORMAL HIGH (ref 3.87–5.11)
RDW: 15 % (ref 11.5–15.5)
WBC: 9.2 10*3/uL (ref 4.0–10.5)
nRBC: 0 % (ref 0.0–0.2)

## 2018-05-02 MED ORDER — SODIUM CHLORIDE 0.9% FLUSH: 3.0000 mL | Freq: Once | INTRAVENOUS | Status: DC

## 2018-05-02 MED ORDER — ACETAMINOPHEN 500 MG PO TABS: 1000.0000 mg | ORAL_TABLET | Freq: Once | ORAL | Status: DC

## 2018-05-02 MED ORDER — IOPAMIDOL (ISOVUE-370) INJECTION 76%
100.0000 mL | Freq: Once | INTRAVENOUS | Status: AC | PRN
  Administered 2018-05-02: 100 mL via INTRAVENOUS

## 2018-05-02 NOTE — ED Triage Notes (Signed)
PT c /o SP and SOB xfew days. Pt in NAD, VSS

## 2018-05-02 NOTE — ED Notes (Signed)
Verified with Dr. Mariea Clonts that there is no other blood work or testing needed at this time.

## 2018-05-02 NOTE — ED Provider Notes (Addendum)
[facility] Emergency Department Provider Note  ____________________________________________  Time seen: Approximately 7:07 PM  I have reviewed the triage vital signs and the nursing notes.   HISTORY  Chief Complaint Chest Pain    HPI Diana Sexton is a 33 y.o. 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  The patient reports that left for the last few days she has had a pulling sensation in the right side of the chest.  Nothing makes it better or worse, including deep breaths.  She has not had any lower extremity swelling or calf pain.  She denies any shortness of breath, lightheadedness or syncope, cough or cold symptoms, fevers or chills.  Past Medical History:  Diagnosis Date  . Anemia   . Anxiety   . Cellulitis 2012   caused stillbirth  . DVT (deep venous thrombosis) ([place]) 06/2011   under right clavicle  . Dysmenorrhea   . Family history of breast cancer 11/2013   BRCA/MyRisk neg  . Headache    related to hormonal meds  . History of kidney stones   . History of prothrombin mutation   . Increased risk of breast cancer 11/2013   IBIS=27%  . Obesity     Patient Active Problem List   Diagnosis Date Noted  . DVT (deep venous thrombosis) ([place]) 02/09/2018  . Menorrhagia with irregular cycle 10/13/2016  . Other irritable bowel syndrome 05/02/2016  . Urinary tract infection 01/21/2015  . Anxiety 01/21/2015  . Nephrolithiasis 01/15/2015  . Abnormal radiologic findings on diagnostic imaging of renal pelvis, ureter, or bladder 01/15/2015  . Low HDL (under 40) 01/01/2015  . Elevated LDL cholesterol level 01/01/2015  . Insomnia 12/16/2014  . Morbid obesity ([place]) 12/11/2014  . Hematuria 12/11/2014  . Preventative health care 12/11/2014  . Low back pain 10/13/2014  . Prothrombin gene mutation ([place]) 10/13/2014  . Hydronephrosis 06/07/2012  . Incomplete emptying of bladder 06/07/2012  . Renal colic  56/25/6389  . Ureteric stone 06/07/2012    Past Surgical History:  Procedure Laterality Date  . CHOLECYSTECTOMY    . CYSTOSCOPY W/ RETROGRADES Left 02/02/2017   Procedure: CYSTOSCOPY WITH RETROGRADE PYELOGRAM;  Surgeon: Abbie Sons, MD;  Location: [facility] ORS;  Service: Urology;  Laterality: Left;  . CYSTOSCOPY/URETEROSCOPY/HOLMIUM LASER/STENT PLACEMENT Left 02/02/2017   Procedure: CYSTOSCOPY/URETEROSCOPY/HOLMIUM LASER/STENT PLACEMENT;  Surgeon: Abbie Sons, MD;  Location: [facility] ORS;  Service: Urology;  Laterality: Left;  . DILITATION & CURRETTAGE/HYSTROSCOPY WITH ESSURE    . INTRAUTERINE DEVICE (IUD) INSERTION    . LITHOTRIPSY    . TONSILLECTOMY    . TONSILLECTOMY AND ADENOIDECTOMY    . TUBAL LIGATION      Current Outpatient Rx  . Order #: [id number] Class: Normal    Allergies Penicillins  Family History  Problem Relation Age of Onset  . Cancer Mother        breast  . Diabetes Father   . Hypertension Father   . Heart disease Maternal Grandfather   . Dementia Paternal Grandmother   . Bladder Cancer Neg Hx   . Kidney cancer Neg Hx     Social History Social History   Tobacco Use  . Smoking status: Never Smoker  . Smokeless tobacco: Never Used  Substance Use Topics  . Alcohol use: Yes    Comment: 1 drink per month  . Drug use: No    Review of Systems Constitutional: No fever/chills.  Lightheadedness or syncope.  No diaphoresis. Eyes: No  visual changes. ENT: No sore throat. No congestion or rhinorrhea. Cardiovascular: Positive right-sided chest pain. Denies palpitations. Respiratory: Denies shortness of breath.  No cough. Gastrointestinal: No abdominal pain.  No nausea, no vomiting.  No diarrhea.  No constipation. Genitourinary: Negative for dysuria. Musculoskeletal: Negative for back pain.  Lower extremity swelling or calf pain. Skin: Negative for rash. Neurological: Negative for headaches. No focal numbness, tingling or weakness.      ____________________________________________   PHYSICAL EXAM:  VITAL SIGNS: ED Triage Vitals  Enc Vitals Group     BP 05/02/18 1533 140/73     Pulse Rate 05/02/18 1533 88     Resp 05/02/18 1533 20     Temp 05/02/18 1533 98.3 F (36.8 C)     Temp Source 05/02/18 1533 Oral     SpO2 05/02/18 1533 99 %     Weight 05/02/18 1534 300 lb (136.1 kg)     Height 05/02/18 1534 5' 5"  (1.651 m)     Head Circumference --      Peak Flow --      Pain Score --      Pain Loc --      Pain Edu? --      Excl. in [place]? --     Constitutional: Alert and oriented. Well appearing and in no acute distress. Answers questions appropriately. Eyes: Conjunctivae are normal.  EOMI. No scleral icterus. Head: Atraumatic. Nose: No congestion/rhinnorhea. Mouth/Throat: Mucous membranes are moist.  Neck: No stridor.  Supple.  No JVD.  No meningismus. Cardiovascular: Normal rate, regular rhythm. No murmurs, rubs or gallops.  Respiratory: Normal respiratory effort.  No accessory muscle use or retractions. Lungs CTAB.  No wheezes, rales or ronchi. Gastrointestinal: Soft, nontender and nondistended.  No guarding or rebound.  No peritoneal signs. Musculoskeletal: No LE edema. No ttp in the calves or palpable cords.  Negative Homan's sign. Neurologic:  A&Ox3.  Speech is clear.  Face and smile are symmetric.  EOMI.  Moves all extremities well. Skin:  Skin is warm, dry and intact. No rash noted. Psychiatric: Mood and affect are normal. Speech and behavior are normal.  Normal judgement.  ____________________________________________   LABS (all labs ordered are listed, but only abnormal results are displayed)  Labs Reviewed  BASIC METABOLIC PANEL - Abnormal; Notable for the following components:      Result Value   Glucose, Bld 119 (*)    All other components within normal limits  CBC - Abnormal; Notable for the following components:   RBC 5.26 (*)    MCV 76.4 (*)    MCH 24.1 (*)    All other components  within normal limits  TROPONIN I  POC URINE PREG, ED  POCT PREGNANCY, URINE  POC URINE PREG, ED   ____________________________________________  EKG  ED ECG REPORT I, Anne-Caroline Mariea Clonts, the attending physician, personally viewed and interpreted this ECG.   Date: 05/21/2018  EKG Time: 1531  Rate: 93  Rhythm: normal sinus rhythm  Axis: normal  Intervals:none  ST&T Change: No STEMI  ____________________________________________  RADIOLOGY  Dg Chest 2 View  Result Date: 05/02/2018 CLINICAL DATA:  Shortness of breath and chest pressure EXAM: CHEST - 2 VIEW COMPARISON:  Chest radiograph February 04, 2018 and chest CT February 09, 2018 FINDINGS: Lungs are clear. Heart size and pulmonary vascularity are normal. No adenopathy. No pneumothorax. No bone lesions. IMPRESSION: No edema or consolidation. Electronically Signed   By: Lowella Grip III M.D.   On: 05/02/2018 16:12  Ct Angio Chest Pe W And/or Wo Contrast  Result Date: 05/02/2018 CLINICAL DATA:  33 y.o. 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 The patient reports that left for the last few days she has had a pulling sensation in the right side of the chest. EXAM: CT ANGIOGRAPHY CHEST WITH CONTRAST TECHNIQUE: Multidetector CT imaging of the chest was performed using the standard protocol during bolus administration of intravenous contrast. Multiplanar CT image reconstructions and MIPs were obtained to evaluate the vascular anatomy. CONTRAST:  189m ISOVUE-370 IOPAMIDOL (ISOVUE-370) INJECTION 76% COMPARISON:  Current chest radiograph.  Chest CTA, 02/09/2018. FINDINGS: Cardiovascular: Contrast opacification of the pulmonary arteries is similar to the pulmonary veins, mildly limiting the exam, specifically for the smaller segmental and subsegmental vessels. Allowing for this mild limitation, there is no evidence of a pulmonary embolism. Heart is normal in size and configuration. No  pericardial effusion. Great vessels are within normal limits. Mediastinum/Nodes: No neck base or axillary masses or enlarged lymph nodes. No mediastinal or hilar masses or enlarged lymph nodes. Trachea and esophagus are within normal limits. Lungs/Pleura: Lungs are clear. No pleural effusion or pneumothorax. Upper Abdomen: Status post cholecystectomy.  No acute findings. Musculoskeletal: No chest wall abnormality. No acute or significant osseous findings. Review of the MIP images confirms the above findings. IMPRESSION: 1. No evidence of a pulmonary embolism, allowing for the mild limitation detailed above. 2. Normal exam. Electronically Signed   By: DLajean ManesM.D.   On: 05/02/2018 19:51    ____________________________________________   PROCEDURES  Procedure(s) performed: None  Procedures  Critical Care performed: No ____________________________________________   INITIAL IMPRESSION / ASSESSMENT AND PLAN / ED COURSE  Pertinent labs & imaging results that were available during my care of the patient were reviewed by me and considered in my medical decision making (see chart for details).  33y.o. female with a history of DVT not on Eliquis presenting with right-sided chest pain.  Overall, the patient is hemodynamically stable.  She has no hypoxia, tachycardia, fever or evidence of DVT.  However, she is very high risk for blood clots and will get a CT of the chest to rule out blood clot.  The remainder of her laboratory studies are reassuring.  Her risk of ACS or MI is very low.  Plan reevaluation for final disposition.  ----------------------------------------- 7:59 PM on 05/02/2018 -----------------------------------------  The patient's CT does not show any evidence of PE with the limitations noted on the radiologist report.  At this time, the patient will be discharged home.  Return precautions and follow-up instructions were  discussed.  ____________________________________________  FINAL CLINICAL IMPRESSION(S) / ED DIAGNOSES  Final diagnoses:  Right-sided chest pain         NEW MEDICATIONS STARTED DURING THIS VISIT:  New Prescriptions   No medications on file      NEula Listen MD 05/02/18 1913    NEula Listen MD 05/02/18 2000    NEula Listen MD 05/21/18 [phone]

## 2018-05-02 NOTE — Discharge Instructions (Addendum)
Please restart your Eliquis, if your primary care physician recommends this.  Return to the emergency department for severe pain, lightheadedness or fainting, shortness of breath, fever, or any other symptoms concerning to you.

## 2018-06-27 IMAGING — CT CT RENAL STONE PROTOCOL
2 of 4 series · 17 of 46 positions shown, 19 images · non-contrast
Comparison: 08/05/2016

CLINICAL DATA: Left-sided abdomen and pelvic pain since 4 a.m..
History of kidney stones.

EXAM:
CT ABDOMEN AND PELVIS WITHOUT CONTRAST
TECHNIQUE: Multidetector CT imaging of the abdomen and pelvis was performed
following the standard protocol without IV contrast.

[Series 2: stone full standard · axial · 0.85mm/px · z∈[-870,-430]mm · 14 of 97 slices shown, 16 images]
[im 5/97  soft-tissue]
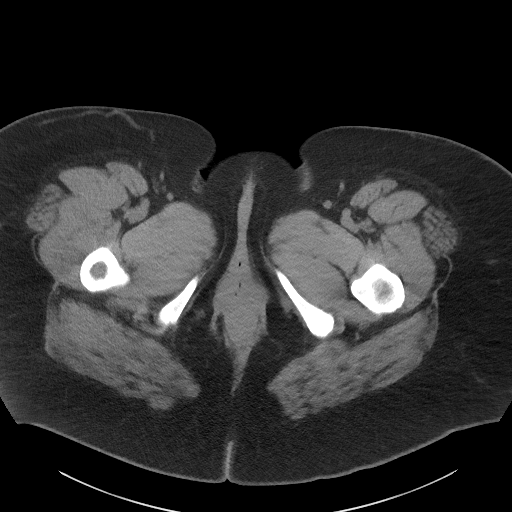
[im 5/97  bone]
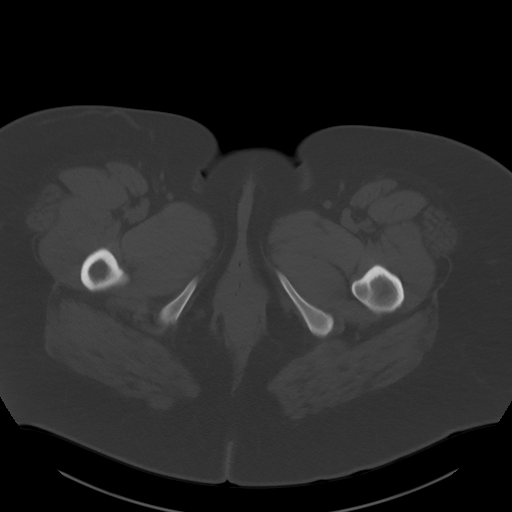
[im 13/97  soft-tissue]
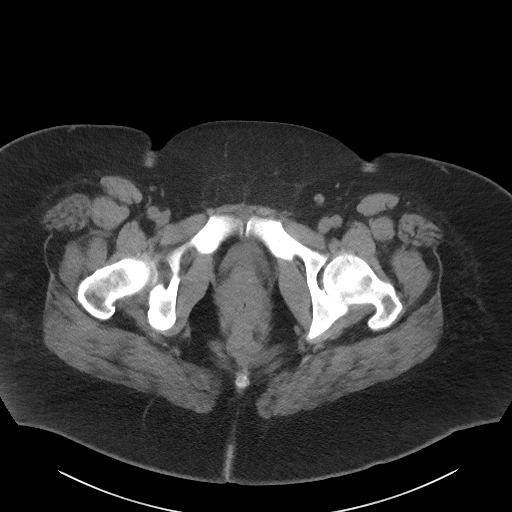
[im 21/97  soft-tissue]
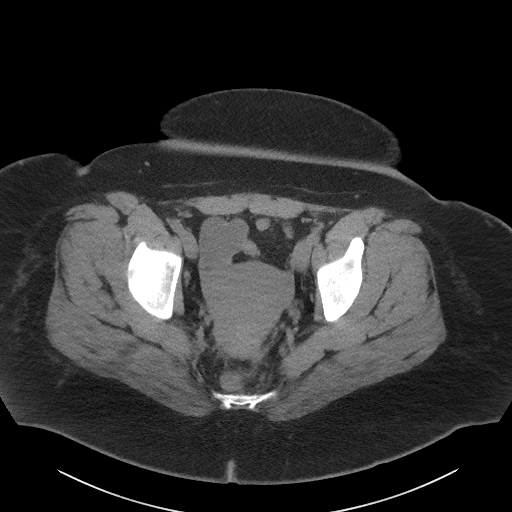
[im 25/97  soft-tissue]
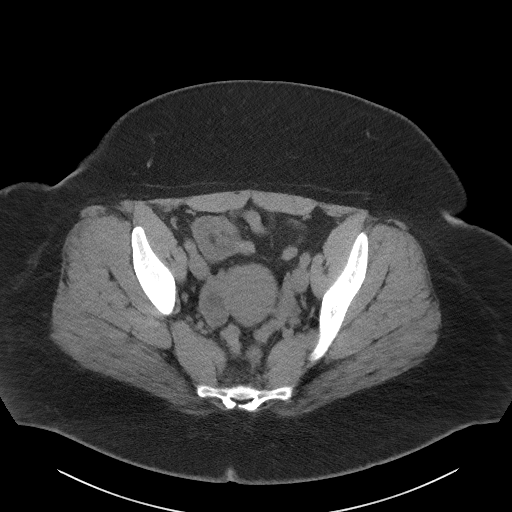
[im 33/97  soft-tissue]
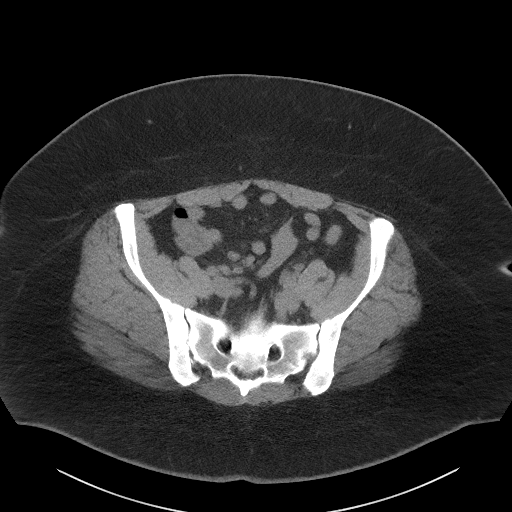
[im 41/97  soft-tissue]
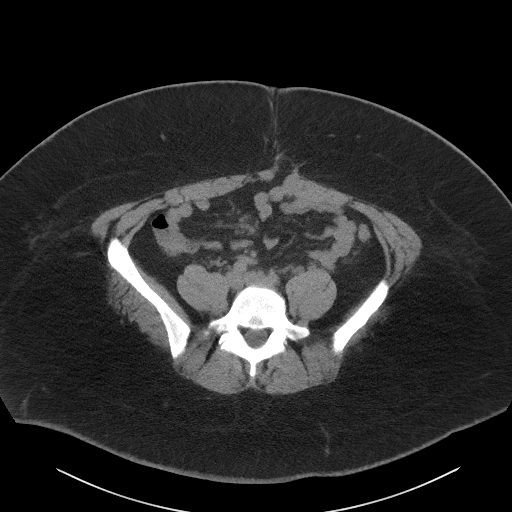
[im 45/97  soft-tissue]
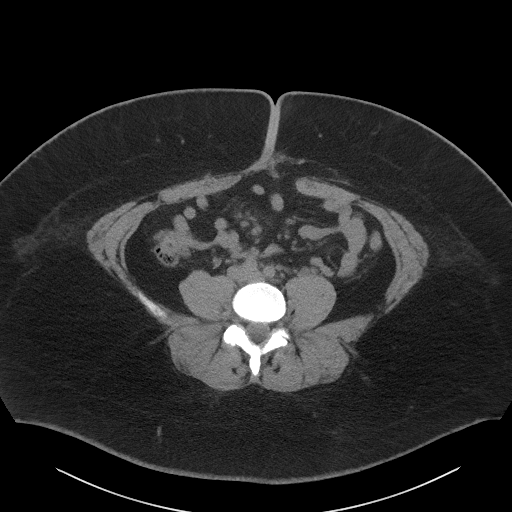
[im 53/97  soft-tissue]
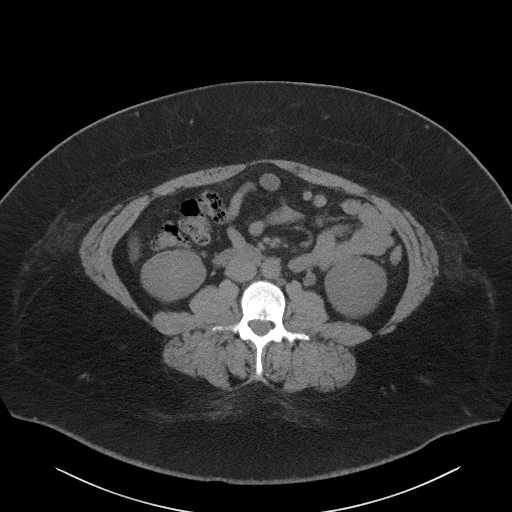
[im 57/97  soft-tissue]
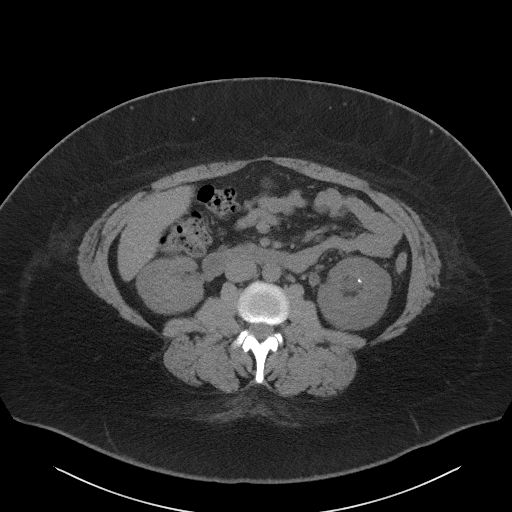
[im 57/97  bone]
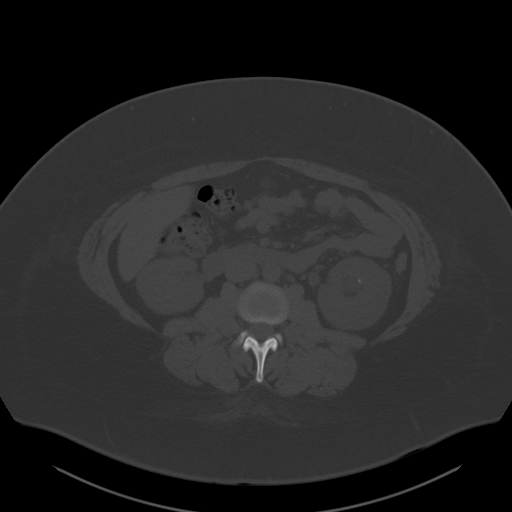
[im 65/97  soft-tissue]
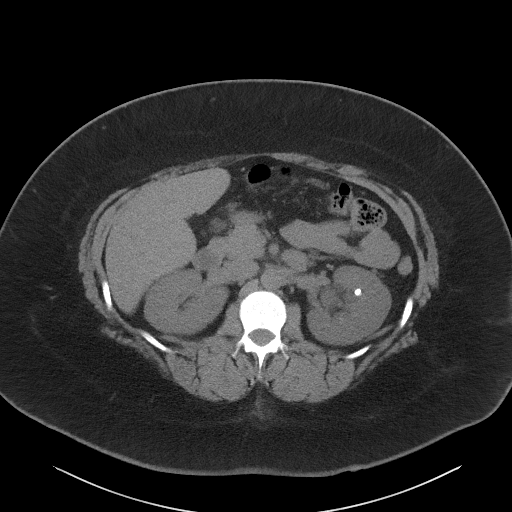
[im 73/97  soft-tissue]
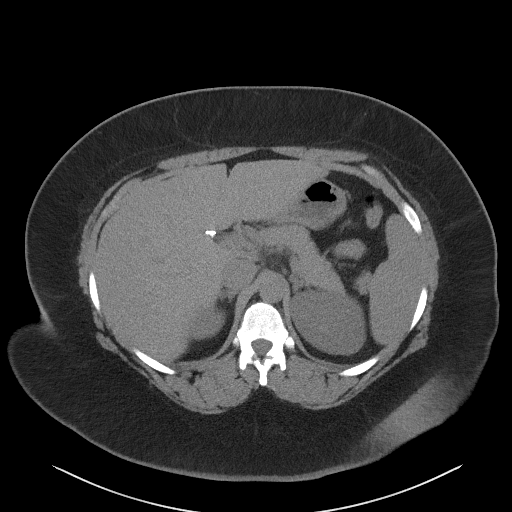
[im 77/97  soft-tissue]
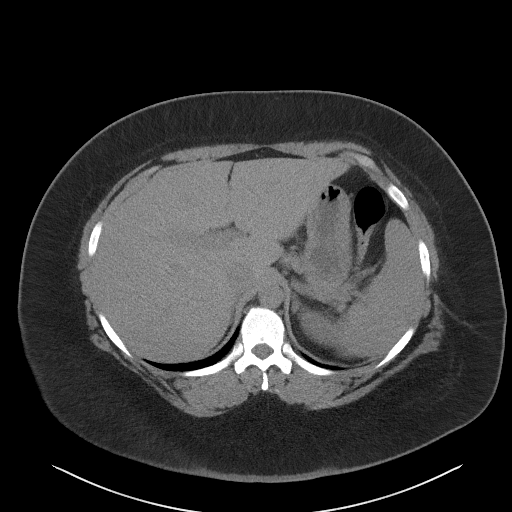
[im 85/97  soft-tissue]
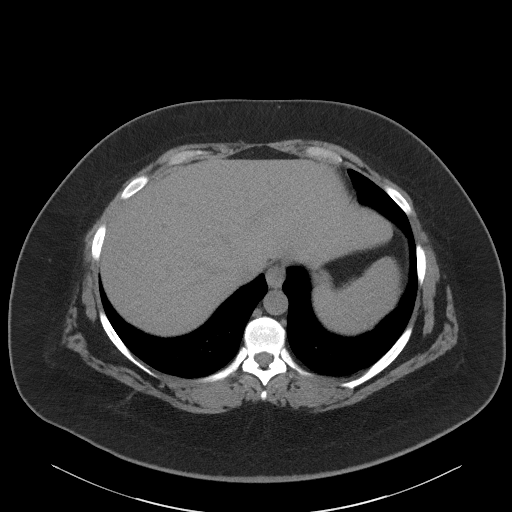
[im 93/97  soft-tissue]
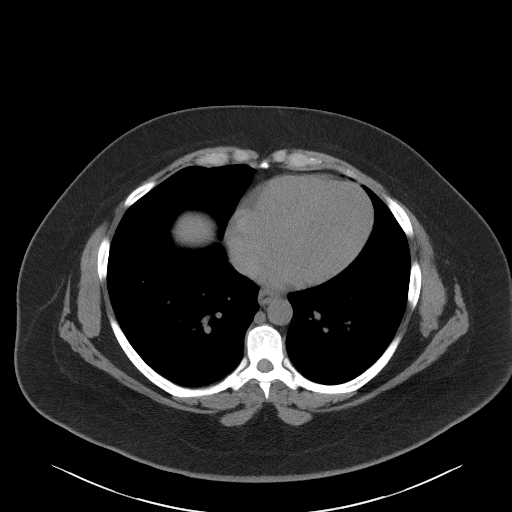

[Series 5: coronal · coronal · 0.86mm/px · 3 of 160 slices shown]
[im 54/160  soft-tissue]
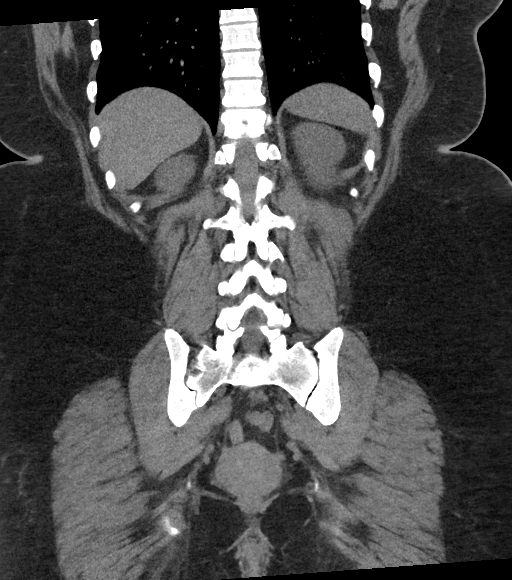
[im 71/160  soft-tissue]
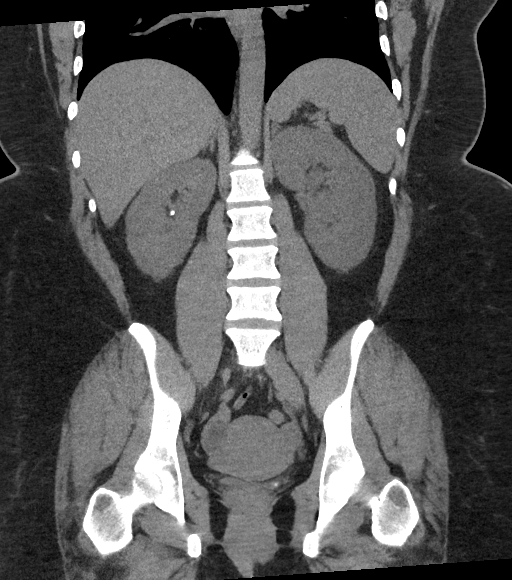
[im 89/160  soft-tissue]
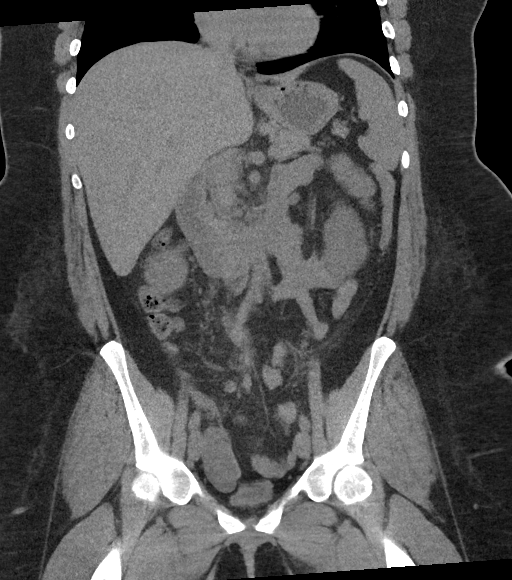

[17 of 46 positions shown; findings below may reference images not displayed]

FINDINGS: Lower chest: No acute abnormality.

Hepatobiliary: Postcholecystectomy. Liver is unremarkable in
appearance.

Pancreas: Unremarkable

Spleen: Unremarkable

Adrenals/Urinary Tract: Moderate left hydronephrosis is associated
with an 8 mm left ureterovesical junction calculus. Bilateral
nephrolithiasis is again demonstrated. The largest calculus in the
left kidney measures 12 mm in the lower pole. Largest calculus
within the right renal collecting system is 8 mm in the upper pole.
No evidence of right ureteral calculus. Adrenal glands are
unremarkable. Bladder is decompressed.

Stomach/Bowel: No evidence of disproportionate dilatation of bowel.
No obvious mass in the colon. Appendix is not clearly visualized.
Stomach and duodenum are decompressed.

Vascular/Lymphatic: No evidence of aortic aneurysm. Small bowel
mesenteric lymph nodes are stable.

Reproductive: Uterus and adnexa are within normal limits for age.

Other: No free-fluid.

Musculoskeletal: No vertebral compression deformity.
IMPRESSION: 8 mm left ureterovesical junction calculus is associated with left
hydronephrosis, a secondary finding of ureteral obstruction.

Bilateral nephrolithiasis.

## 2018-07-12 NOTE — Progress Notes (Signed)
[place]  Telephone:([phone] Fax:([phone]) [phone]  ID: [id number] CURBOW OB: 1985-08-30  MR#: [id number]  AOZ#:[id number]  Patient Care Team: Kathrine Haddock, NP as PCP - General (Nurse Practitioner)  I connected with Tonita Cong Krass on 07/20/18 at 10:45 AM EDT by video enabled telemedicine visit and verified that I am speaking with the correct person using two identifiers.   I discussed the limitations, risks, security and privacy concerns of performing an evaluation and management service by telemedicine and the availability of in-person appointments. I also discussed with the patient that there may be a patient responsible charge related to this service. The patient expressed understanding and agreed to proceed.   Other persons participating in the visit and their role in the encounter: Patient, MD  Patient's location: Home Provider's location: Clinic  CHIEF COMPLAINT: Heterozygous for prothrombin gene mutation.  INTERVAL HISTORY: Patient agreed to video enabled telemedicine visit for further evaluation and discussion on whether to continue anticoagulation.  Patient states secondary to financial cost, she discontinued Eliquis approximately 1 month ago.  She currently feels well and is asymptomatic.  She has no neurologic complaints.  She denies any recent fevers or illnesses.  She has a good appetite and denies weight loss.  She has no chest pain, shortness of breath, cough, or hemoptysis.  She has no nausea, vomiting, constipation, or diarrhea.  She has no urinary complaints.  Patient feels at her baseline offers no specific complaints today.  REVIEW OF SYSTEMS:   Review of Systems  Constitutional: Negative.  Negative for fever, malaise/fatigue and weight loss.  Respiratory: Negative.  Negative for cough and shortness of breath.   Cardiovascular: Negative.  Negative for chest pain and leg swelling.  Gastrointestinal: Negative.  Negative for abdominal pain, blood  in stool and melena.  Genitourinary: Negative.  Negative for hematuria.  Musculoskeletal: Negative.  Negative for back pain.  Skin: Negative.  Negative for rash.  Neurological: Negative.  Negative for focal weakness, weakness and headaches.  Psychiatric/Behavioral: Negative.  The patient is not nervous/anxious.     As per HPI. Otherwise, a complete review of systems is negative.  PAST MEDICAL HISTORY: Past Medical History:  Diagnosis Date  . Anemia   . Anxiety   . Cellulitis 2012   caused stillbirth  . DVT (deep venous thrombosis) ([place]) 06/2011   under right clavicle  . Dysmenorrhea   . Family history of breast cancer 11/2013   BRCA/MyRisk neg  . Headache    related to hormonal meds  . History of kidney stones   . History of prothrombin mutation   . Increased risk of breast cancer 11/2013   IBIS=27%  . Obesity     PAST SURGICAL HISTORY: Past Surgical History:  Procedure Laterality Date  . CHOLECYSTECTOMY    . CYSTOSCOPY W/ RETROGRADES Left 02/02/2017   Procedure: CYSTOSCOPY WITH RETROGRADE PYELOGRAM;  Surgeon: Abbie Sons, MD;  Location: [facility] ORS;  Service: Urology;  Laterality: Left;  . CYSTOSCOPY/URETEROSCOPY/HOLMIUM LASER/STENT PLACEMENT Left 02/02/2017   Procedure: CYSTOSCOPY/URETEROSCOPY/HOLMIUM LASER/STENT PLACEMENT;  Surgeon: Abbie Sons, MD;  Location: [facility] ORS;  Service: Urology;  Laterality: Left;  . DILITATION & CURRETTAGE/HYSTROSCOPY WITH ESSURE    . INTRAUTERINE DEVICE (IUD) INSERTION    . LITHOTRIPSY    . TONSILLECTOMY    . TONSILLECTOMY AND ADENOIDECTOMY    . TUBAL LIGATION      FAMILY HISTORY: Family History  Problem Relation Age of Onset  . Cancer Mother  breast  . Diabetes Father   . Hypertension Father   . Heart disease Maternal Grandfather   . Dementia Paternal Grandmother   . Bladder Cancer Neg Hx   . Kidney cancer Neg Hx     ADVANCED DIRECTIVES (Y/N):  N  HEALTH MAINTENANCE: Social History   Tobacco Use  . Smoking  status: Never Smoker  . Smokeless tobacco: Never Used  Substance Use Topics  . Alcohol use: Yes    Comment: 1 drink per month  . Drug use: No     Colonoscopy:  PAP:  Bone density:  Lipid panel:  Allergies  Allergen Reactions  . Penicillins Rash    Has patient had a PCN reaction causing immediate rash, facial/tongue/throat swelling, SOB or lightheadedness with hypotension: Yes Has patient had a PCN reaction causing severe rash involving mucus membranes or skin necrosis: Yes Has patient had a PCN reaction that required hospitalization: No Has patient had a PCN reaction occurring within the last 10 years: Yes If all of the above answers are "NO", then may proceed with Cephalosporin use.     Current Outpatient Medications  Medication Sig Dispense Refill  . apixaban (ELIQUIS) 5 MG TABS tablet Take 1 tablet (5 mg total) by mouth 2 (two) times daily. 60 tablet 6   No current facility-administered medications for this visit.     OBJECTIVE: There were no vitals filed for this visit.   There is no height or weight on file to calculate BMI.    ECOG FS:0 - Asymptomatic  General: Well-developed, well-nourished, no acute distress. HEENT: Normocephalic. Neuro: Alert, answering all questions appropriately. Cranial nerves grossly intact. Skin: No rashes or petechiae noted. Psych: Normal affect.  LAB RESULTS:  Lab Results  Component Value Date   NA 139 05/02/2018   K 3.9 05/02/2018   CL 104 05/02/2018   CO2 25 05/02/2018   GLUCOSE 119 (H) 05/02/2018   BUN 10 05/02/2018   CREATININE 0.59 05/02/2018   CALCIUM 9.1 05/02/2018   PROT 7.7 02/08/2018   ALBUMIN 3.5 02/08/2018   AST 20 02/08/2018   ALT 18 02/08/2018   ALKPHOS 72 02/08/2018   BILITOT 0.4 02/08/2018   GFRNONAA >60 05/02/2018   GFRAA >60 05/02/2018    Lab Results  Component Value Date   WBC 9.2 05/02/2018   NEUTROABS 5.9 01/27/2017   HGB 12.7 05/02/2018   HCT 40.2 05/02/2018   MCV 76.4 (L) 05/02/2018   PLT 400  05/02/2018     STUDIES: No results found.  ASSESSMENT: Heterozygous for prothrombin gene mutation  PLAN:    1. Heterozygous for prothrombin gene mutation: The remainder of her hypercoagulable work-up was negative.  Patient is at approximately 2-3 times risk of developing pulmonary embolism and DVT over the general population.  This is now her second blood clot, although first without inciting factor.  Her initial blood clot occurred during pregnancy which is a known risk factor.  After lengthy discussion with the patient, she expressed understanding of the risks and benefits of discontinuing Eliquis versus lifelong anticoagulation.  Patient wishes to discontinue treatment at this time, but did agree if she ever had a third clot for any reason at that point she would require lifelong anticoagulation.  We also discussed the possibility of prophylactic Lovenox for extended travel or other predictable risk factors.  Patient will return to clinic on an as-needed basis for discussion of temporary anticoagulation if needed.   I provided 25 minutes of face-to-face video visit time during this encounter,  and > 50% was spent counseling as documented under my assessment & plan.   Patient expressed understanding and was in agreement with this plan. She also understands that She can call clinic at any time with any questions, concerns, or complaints.    Lloyd Huger, MD   07/20/2018 11:03 AM

## 2018-07-18 ENCOUNTER — Other Ambulatory Visit: Payer: Self-pay

## 2018-07-19 ENCOUNTER — Encounter: Payer: Self-pay | Admitting: Oncology

## 2018-07-19 ENCOUNTER — Other Ambulatory Visit: Payer: Self-pay

## 2018-07-19 ENCOUNTER — Inpatient Hospital Stay: Payer: BLUE CROSS/BLUE SHIELD | Attending: Oncology | Admitting: Oncology

## 2018-07-19 DIAGNOSIS — Z7901 Long term (current) use of anticoagulants: Secondary | ICD-10-CM | POA: Diagnosis not present

## 2018-07-19 DIAGNOSIS — D6852 Prothrombin gene mutation: Secondary | ICD-10-CM

## 2018-07-19 DIAGNOSIS — Z86718 Personal history of other venous thrombosis and embolism: Secondary | ICD-10-CM

## 2018-07-19 NOTE — Progress Notes (Signed)
Patient stated that she had been doing well with no complaints. Patient just wants to know if she should continue taking her Eliquis.

## 2018-08-09 ENCOUNTER — Ambulatory Visit: Payer: BLUE CROSS/BLUE SHIELD | Admitting: Oncology

## 2018-08-27 ENCOUNTER — Ambulatory Visit (INDEPENDENT_AMBULATORY_CARE_PROVIDER_SITE_OTHER): Payer: BC Managed Care – PPO | Admitting: Obstetrics and Gynecology

## 2018-08-27 ENCOUNTER — Encounter: Payer: Self-pay | Admitting: Obstetrics and Gynecology

## 2018-08-27 ENCOUNTER — Encounter: Payer: Self-pay | Admitting: Oncology

## 2018-08-27 ENCOUNTER — Other Ambulatory Visit: Payer: Self-pay

## 2018-08-27 VITALS — BP 128/84 | Ht 65.0 in | Wt 324.0 lb

## 2018-08-27 DIAGNOSIS — N921 Excessive and frequent menstruation with irregular cycle: Secondary | ICD-10-CM

## 2018-08-27 DIAGNOSIS — D6852 Prothrombin gene mutation: Secondary | ICD-10-CM

## 2018-08-27 MED ORDER — MEDROXYPROGESTERONE ACETATE 10 MG PO TABS: 10.0000 mg | ORAL_TABLET | Freq: Every day | ORAL | 0 refills | Status: DC

## 2018-08-27 NOTE — Progress Notes (Signed)
Obstetrics & Gynecology Office Visit   Chief Complaint  Patient presents with  . Menstrual Problem   History of Present Illness: 33 y.o. [id number] female who presents in follow up for heavy menses.  At her last visit she was taking Provera.10 days per months.  August through December she had no menses.  She started her menses in December. At this point she had another blood clot in her right arm.  She was on no blood thinning medication at all during the August through December time-frame.  She started in [place] in December.  She took the medication for about 5 months.  She was told she did not need to take the medication any longer.  In June she had a normal period.  Her period stopped for about a week and she re-started and has been bleeding since.  She is forming really large clots at this time. She is requiring a pad and tampon at the same time.  She has not been taking Provera for about a year.  At that point she had been having periods that didn't really line up to when she was taking the medication.  The last pap smear I can find on record was about 5 years ago. It was normal.  She has a history of having an IUD. She had nearly-constant pelvic pain and she formed ovarian cysts.    Past Medical History:  Diagnosis Date  . Anemia   . Anxiety   . Cellulitis 2012   caused stillbirth  . DVT (deep venous thrombosis) ([place]) 06/2011   under right clavicle  . Dysmenorrhea   . Family history of breast cancer 11/2013   BRCA/MyRisk neg  . Headache    related to hormonal meds  . History of kidney stones   . History of prothrombin mutation   . Increased risk of breast cancer 11/2013   IBIS=27%  . Obesity    Past Surgical History:  Procedure Laterality Date  . CHOLECYSTECTOMY    . CYSTOSCOPY W/ RETROGRADES Left 02/02/2017   Procedure: CYSTOSCOPY WITH RETROGRADE PYELOGRAM;  Surgeon: Abbie Sons, MD;  Location: [facility] ORS;  Service: Urology;  Laterality: Left;  .  CYSTOSCOPY/URETEROSCOPY/HOLMIUM LASER/STENT PLACEMENT Left 02/02/2017   Procedure: CYSTOSCOPY/URETEROSCOPY/HOLMIUM LASER/STENT PLACEMENT;  Surgeon: Abbie Sons, MD;  Location: [facility] ORS;  Service: Urology;  Laterality: Left;  . DILITATION & CURRETTAGE/HYSTROSCOPY WITH ESSURE    . INTRAUTERINE DEVICE (IUD) INSERTION    . LITHOTRIPSY    . TONSILLECTOMY    . TONSILLECTOMY AND ADENOIDECTOMY    . TUBAL LIGATION     Gynecologic History: Patient's last menstrual period was 08/06/2018.  Obstetric History: [id number]  Family History  Problem Relation Age of Onset  . Cancer Mother        breast  . Diabetes Father   . Hypertension Father   . Heart disease Maternal Grandfather   . Dementia Paternal Grandmother   . Bladder Cancer Neg Hx   . Kidney cancer Neg Hx    Social History   Socioeconomic History  . Marital status: Married    Spouse name: Not on file  . Number of children: Not on file  . Years of education: Not on file  . Highest education level: Not on file  Occupational History  . Not on file  Social Needs  . Financial resource strain: Not hard at all  . Food insecurity    Worry: Never true    Inability: Never true  . Transportation needs  Medical: No    Non-medical: No  Tobacco Use  . Smoking status: Never Smoker  . Smokeless tobacco: Never Used  Substance and Sexual Activity  . Alcohol use: Yes    Comment: 1 drink per month  . Drug use: No  . Sexual activity: Yes    Birth control/protection: Other-see comments, Surgical    Comment: tubal ligation  Lifestyle  . Physical activity    Days per week: 0 days    Minutes per session: 0 min  . Stress: Not at all  Relationships  . Social Herbalist on phone: Twice a week    Gets together: Twice a week    Attends religious service: Never    Active member of club or organization: No    Attends meetings of clubs or organizations: Never    Relationship status: Married  . Intimate partner violence    Fear  of current or ex partner: No    Emotionally abused: No    Physically abused: No    Forced sexual activity: No  Other Topics Concern  . Not on file  Social History Narrative   Lives with husband.    Allergies  Allergen Reactions  . Penicillins Rash    Has patient had a PCN reaction causing immediate rash, facial/tongue/throat swelling, SOB or lightheadedness with hypotension: Yes Has patient had a PCN reaction causing severe rash involving mucus membranes or skin necrosis: Yes Has patient had a PCN reaction that required hospitalization: No Has patient had a PCN reaction occurring within the last 10 years: Yes If all of the above answers are "NO", then may proceed with Cephalosporin use.     Prior to Admission medications   Medication Sig Start Date End Date Taking? Authorizing Provider  apixaban (ELIQUIS) 5 MG TABS tablet Take 1 tablet (5 mg total) by mouth 2 (two) times daily. 02/25/18   Lloyd Huger, MD    Review of Systems  Constitutional: Negative.   HENT: Negative.   Eyes: Negative.   Respiratory: Negative.   Cardiovascular: Negative.   Gastrointestinal: Negative.   Genitourinary: Negative.   Musculoskeletal: Negative.   Skin: Negative.   Neurological: Negative.   Psychiatric/Behavioral: Negative.      Physical Exam BP 128/84   Ht '5\' 5"'$  (1.651 m)   Wt (!) 324 lb (147 kg)   LMP 08/06/2018   BMI 53.92 kg/m  Patient's last menstrual period was 08/06/2018. Physical Exam Constitutional:      General: She is not in acute distress.    Appearance: Normal appearance.  HENT:     Head: Normocephalic and atraumatic.  Eyes:     General: No scleral icterus.    Conjunctiva/sclera: Conjunctivae normal.  Neurological:     General: No focal deficit present.     Mental Status: She is alert and oriented to person, place, and time.     Cranial Nerves: No cranial nerve deficit.  Psychiatric:        Mood and Affect: Mood normal.        Behavior: Behavior normal.         Judgment: Judgment normal.   Patient declines pelvic exam.   Female chaperone present for pelvic and breast  portions of the physical exam  Assessment: 33 y.o. [id number] female here for  1. Menorrhagia with irregular cycle   2. Prothrombin gene mutation [facility])      Plan: Problem List Items Addressed This Visit      Other  Prothrombin gene mutation (HCC)   Relevant Orders   US PELVIS TRANSVAGINAL NON-OB (TV ONLY)   Menorrhagia with irregular cycle - Primary   Relevant Medications   medroxyPROGESTERone (PROVERA) 10 MG tablet   Other Relevant Orders   US PELVIS TRANSVAGINAL NON-OB (TV ONLY)     Pelvic u/s Needs emb, will get after u/s.  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   Prentice Docker, MD 08/27/2018 9:25 AM

## 2018-08-28 ENCOUNTER — Other Ambulatory Visit: Payer: Self-pay

## 2018-09-06 ENCOUNTER — Other Ambulatory Visit (HOSPITAL_COMMUNITY): Admit: 2018-09-06 | Payer: BC Managed Care – PPO

## 2018-09-06 ENCOUNTER — Other Ambulatory Visit (HOSPITAL_COMMUNITY)
Admission: RE | Admit: 2018-09-06 | Discharge: 2018-09-06 | Disposition: A | Discharge status: Home / Self Care | Payer: BC Managed Care – PPO | Source: Ambulatory Visit | Attending: Obstetrics and Gynecology | Admitting: Obstetrics and Gynecology

## 2018-09-06 ENCOUNTER — Other Ambulatory Visit: Payer: Self-pay

## 2018-09-06 ENCOUNTER — Ambulatory Visit (INDEPENDENT_AMBULATORY_CARE_PROVIDER_SITE_OTHER): Payer: BC Managed Care – PPO | Admitting: Obstetrics and Gynecology

## 2018-09-06 ENCOUNTER — Encounter: Payer: Self-pay | Admitting: Obstetrics and Gynecology

## 2018-09-06 ENCOUNTER — Ambulatory Visit (INDEPENDENT_AMBULATORY_CARE_PROVIDER_SITE_OTHER): Payer: BC Managed Care – PPO

## 2018-09-06 VITALS — BP 132/88 | Ht 65.0 in | Wt 322.0 lb

## 2018-09-06 DIAGNOSIS — N921 Excessive and frequent menstruation with irregular cycle: Secondary | ICD-10-CM

## 2018-09-06 DIAGNOSIS — Z124 Encounter for screening for malignant neoplasm of cervix: Secondary | ICD-10-CM

## 2018-09-06 DIAGNOSIS — Z113 Encounter for screening for infections with a predominantly sexual mode of transmission: Secondary | ICD-10-CM | POA: Diagnosis not present

## 2018-09-06 DIAGNOSIS — D6852 Prothrombin gene mutation: Secondary | ICD-10-CM

## 2018-09-06 NOTE — Progress Notes (Signed)
Gynecology Ultrasound Follow Up  Chief Complaint:  Chief Complaint  Patient presents with  . Follow-up  ultrasound for abnormal uterine bleeding with menorrhagia with irregular  cycle   History of Present Illness: Patient is a 33 y.o. female who presents today for ultrasound evaluation of the above .  Ultrasound demonstrates the following findings Adnexa: no masses seen  Uterus: anteverted with endometrial stripe  8.9 mm Additional: no abnormal findings  She has had no bleeding since her last appointment  Past Medical History:  Diagnosis Date  . Anemia   . Anxiety   . Cellulitis 2012   caused stillbirth  . DVT (deep venous thrombosis) ([place]) 06/2011   under right clavicle  . Dysmenorrhea   . Family history of breast cancer 11/2013   BRCA/MyRisk neg  . Headache    related to hormonal meds  . History of kidney stones   . History of prothrombin mutation   . Increased risk of breast cancer 11/2013   IBIS=27%  . Obesity     Past Surgical History:  Procedure Laterality Date  . CHOLECYSTECTOMY    . CYSTOSCOPY W/ RETROGRADES Left 02/02/2017   Procedure: CYSTOSCOPY WITH RETROGRADE PYELOGRAM;  Surgeon: Abbie Sons, MD;  Location: [facility] ORS;  Service: Urology;  Laterality: Left;  . CYSTOSCOPY/URETEROSCOPY/HOLMIUM LASER/STENT PLACEMENT Left 02/02/2017   Procedure: CYSTOSCOPY/URETEROSCOPY/HOLMIUM LASER/STENT PLACEMENT;  Surgeon: Abbie Sons, MD;  Location: [facility] OR[facility];  Service: Urology;  Laterality: Left;  . DILITATION & CURRETTAGE/HYSTROSCOPY WITH ESSURE    . INTRAUTERINE DEVICE (IUD) INSERTION    . LITHOTRIPSY    . TONSILLECTOMY    . TONSILLECTOMY AND ADENOIDECTOMY    . TUBAL LIGATION      Family History  Problem Relation Age of Onset  . Cancer Mother        breast  . Diabetes Father   . Hypertension Father   . Heart disease Maternal Grandfather   . Dementia Paternal Grandmother   . Bladder Cancer Neg Hx   . Kidney cancer Neg Hx     Social History    Socioeconomic History  . Marital status: Married    Spouse name: Not on file  . Number of children: Not on file  . Years of education: Not on file  . Highest education level: Not on file  Occupational History  . Not on file  Social Needs  . Financial resource strain: Not hard at all  . Food insecurity    Worry: Never true    Inability: Never true  . Transportation needs    Medical: No    Non-medical: No  Tobacco Use  . Smoking status: Never Smoker  . Smokeless tobacco: Never Used  Substance and Sexual Activity  . Alcohol use: Yes    Comment: 1 drink per month  . Drug use: No  . Sexual activity: Yes    Birth control/protection: Other-see comments, Surgical    Comment: tubal ligation  Lifestyle  . Physical activity    Days per week: 0 days    Minutes per session: 0 min  . Stress: Not at all  Relationships  . Social Herbalist on phone: Twice a week    Gets together: Twice a week    Attends religious service: Never    Active member of club or organization: No    Attends meetings of clubs or organizations: Never    Relationship status: Married  . Intimate partner violence    Fear of current or  ex partner: No    Emotionally abused: No    Physically abused: No    Forced sexual activity: No  Other Topics Concern  . Not on file  Social History Narrative   Lives with husband.    Allergies  Allergen Reactions  . Penicillins Rash    Has patient had a PCN reaction causing immediate rash, facial/tongue/throat swelling, SOB or lightheadedness with hypotension: Yes Has patient had a PCN reaction causing severe rash involving mucus membranes or skin necrosis: Yes Has patient had a PCN reaction that required hospitalization: No Has patient had a PCN reaction occurring within the last 10 years: Yes If all of the above answers are "NO", then may proceed with Cephalosporin use.     Prior to Admission medications   Medication Sig Start Date End Date Taking?  Authorizing Provider  medroxyPROGESTERone (PROVERA) 10 MG tablet Take 1 tablet (10 mg total) by mouth daily for 10 days. Patient not taking: Reported on 09/06/2018 08/27/18 09/06/18  Will Bonnet, MD    Physical Exam BP 132/88   Ht _0  (1.651 m)   Wt (!) 322 lb (146.1 kg)   BMI 53.58 kg/m    Physical Exam Constitutional:      General: She is not in acute distress.    Appearance: Normal appearance.  Genitourinary:     Pelvic exam was performed with patient in the lithotomy position.     Vulva, inguinal canal, urethra, vagina and cervix normal.  HENT:     Head: Normocephalic and atraumatic.  Eyes:     General: No scleral icterus.    Conjunctiva/sclera: Conjunctivae normal.  Neurological:     General: No focal deficit present.     Mental Status: She is alert and oriented to person, place, and time.     Cranial Nerves: No cranial nerve deficit.  Psychiatric:        Mood and Affect: Mood normal.        Behavior: Behavior normal.        Judgment: Judgment normal.    Endometrial Biopsy After discussion with the patient regarding her abnormal uterine bleeding I recommended that she proceed with an endometrial biopsy for further diagnosis. The risks, benefits, alternatives, and indications for an endometrial biopsy were discussed with the patient in detail. She understood the risks including infection, bleeding, cervical laceration and uterine perforation.  Verbal consent was obtained.   PROCEDURE NOTE:  P[place] endometrial biopsy was performed using aseptic technique with iodine preparation.  The uterus was sounded to a length of 8.5 cm.  Adequate sampling was obtained with minimal blood loss.  The patient tolerated the procedure well.  Disposition will be pending pathology.  Imaging Results US Pelvis Transvaginal Non-ob (tv Only)  Result Date: 09/06/2018 Patient Name: Diana Sexton DOB: [birth year] MRN: [id number] ULTRASOUND REPORT Location: [facility] Date of Service:  09/06/2018 Indications:Abnormal Uterine Bleeding Findings: The uterus is anteverted and measures 9.7 x 6.0 x 5.5 cm. Echo texture is homogenous without evidence of focal masses. The Endometrium measures 8.9 mm. Right Ovary: measures 2.7 x 2.7 x 2.1 cm. It is normal in appearance. Left Ovary: measures 2.5 x 2.5 x 2.4 cm. It is normal in appearance. There is a dominant follicle in the left ovary measuring 19.5 x 16.7 x 18.7 mm. Survey of the adnexa demonstrates no adnexal masses. There is no free fluid in the cul de sac. Impression: 1. Normal pelvic ultrasound. Gweneth Dimitri, RT The ultrasound images and findings were  reviewed by me and I agree with the above report. Prentice Docker, MD, [facility], [facility] 09/06/2018 11:59 AM       Assessment: 33 y.o. [id number]    ICD-10-CM   1. Menorrhagia with irregular cycle  N92.1 Cytology - PAP    Surgical pathology  2. Prothrombin gene mutation ([place])  D68.52   3. Pap smear for cervical cancer screening  Z12.4 Cytology - PAP  4. Screen for STD (sexually transmitted disease)  Z11.3 Cytology - PAP    Plan: Problem List Items Addressed This Visit      Other   Prothrombin gene mutation ([place])   Menorrhagia with irregular cycle - Primary   Relevant Orders   Cytology - PAP   Surgical pathology    Other Visit Diagnoses    Pap smear for cervical cancer screening       Relevant Orders   Cytology - PAP   Screen for STD (sexually transmitted disease)       Relevant Orders   Cytology - PAP      Long discussion had regarding treatment options.  She has tried hormonal options in the past and is failed or had side effects that were too bothersome for her to tolerate the medication.  Also, given her prothrombin gene mutation, she has had an increase in risk for VTE.  She strongly desires hysterectomy.  We discussed this as an option for treatment with some risk given her history of VTE and prothrombin gene mutation.  We will schedule  for robotic assisted total laparoscopic hysterectomy and bilateral salpingectomy.  Will send message to her hematologist for perioperative VTE prophylaxis, given her history.  Most likely she will need 4 to 6 weeks of Lovenox treatment postoperatively.  Pap smear and endometrial biopsy performed today.  20 minutes spent in face to face discussion with > 50% spent in counseling,management, and coordination of care of her menorrhagia with irregular cycle and prothrombin gene mutation.   Prentice Docker, MD, [facility], [facility] 09/06/2018 12:31 PM

## 2018-09-10 LAB — CYTOLOGY - PAP
Chlamydia: NEGATIVE
Diagnosis: NEGATIVE
HPV: NOT DETECTED
Neisseria Gonorrhea: NEGATIVE

## 2018-10-17 ENCOUNTER — Telehealth: Payer: Self-pay | Admitting: Obstetrics and Gynecology

## 2018-10-17 NOTE — Telephone Encounter (Signed)
Left generic VM 

## 2018-10-24 NOTE — Telephone Encounter (Signed)
Patient is calling for labs results. Please advise. 

## 2018-10-25 ENCOUNTER — Other Ambulatory Visit: Payer: Self-pay | Admitting: Obstetrics and Gynecology

## 2018-10-25 DIAGNOSIS — N921 Excessive and frequent menstruation with irregular cycle: Secondary | ICD-10-CM

## 2018-10-31 ENCOUNTER — Telehealth: Payer: Self-pay | Admitting: Obstetrics and Gynecology

## 2018-10-31 NOTE — Telephone Encounter (Signed)
Discussed surgical options with patient again.  Discussed trying to avoid hysterectomy if at all possible, given her risks, especially of DVT and PE.  I also suspect some significant scar tissue (see my operative report from her tubal ligation in 2013).  She is agreeable to attempting ablation prior to hysterectomy.  We discussed that there is no guarantee that ablation will be 100% successful.  She also understands that she may continue to bleed some after the ablation, however, the bleeding should be less.  However, there may be no change in her bleeding.  In that case we would need to proceed with hysterectomy if her bleeding continues to be severe and disruptive to her life.

## 2018-11-04 ENCOUNTER — Telehealth: Payer: Self-pay | Admitting: Obstetrics and Gynecology

## 2018-11-04 NOTE — Telephone Encounter (Signed)
-----   Message from Will Bonnet, MD sent at 10/31/2018  6:37 PM EDT ----- Regarding: Schedule surgery Surgery Booking Request Patient Full Name:  Diana Sexton  MRN: [id number]  DOB: [birth year]  Surgeon: Prentice Docker, MD  Requested Surgery Date and Time: TBD Primary Diagnosis AND Code: Menorrhagia with irregular cycle (N92.1), Prothrombin Gene Mutation [id number]) Secondary Diagnosis and Code:  Surgical Procedure: Hysteroscopy, dilation and curettage, endometrial ablation ([facility]erva system OK. Request rep be present) L&D Notification: No Admission Status: same day surgery Length of Surgery: 45 min Special Case Needs: Minerva System and REp H&P: TBD (date) Phone Interview???: no Interpreter: Language:  Medical Clearance: no Special Scheduling Instructions: none Acuity: P3

## 2018-11-04 NOTE — Telephone Encounter (Signed)
Patient is aware of H&P on 12/10/18 @ 8:50am w/ Dr. Glennon Mac, Pre-admit Testing to be scheduled, Covid testing on 12/20/18 @ 8-10:30am, and OR on 12/24/18. Patient is aware she will be asked to quarantine after Covid testing. Patient is aware she may receive calls from the [facility] and [facility] Patient confirmed BCBS and no secondary insurance.

## 2018-11-25 ENCOUNTER — Encounter: Payer: BC Managed Care – PPO | Admitting: Obstetrics and Gynecology

## 2018-12-02 ENCOUNTER — Emergency Department
Admission: EM | Admit: 2018-12-02 | Discharge: 2018-12-02 | Disposition: A | Discharge status: Home / Self Care | Payer: BC Managed Care – PPO | Attending: Emergency Medicine | Admitting: Emergency Medicine

## 2018-12-02 ENCOUNTER — Emergency Department: Payer: BC Managed Care – PPO

## 2018-12-02 ENCOUNTER — Ambulatory Visit: Payer: Self-pay | Admitting: *Deleted

## 2018-12-02 ENCOUNTER — Encounter: Payer: Self-pay | Admitting: Emergency Medicine

## 2018-12-02 ENCOUNTER — Other Ambulatory Visit: Payer: Self-pay

## 2018-12-02 DIAGNOSIS — J9801 Acute bronchospasm: Secondary | ICD-10-CM | POA: Diagnosis not present

## 2018-12-02 DIAGNOSIS — R509 Fever, unspecified: Secondary | ICD-10-CM | POA: Diagnosis not present

## 2018-12-02 DIAGNOSIS — R05 Cough: Secondary | ICD-10-CM | POA: Diagnosis not present

## 2018-12-02 DIAGNOSIS — R091 Pleurisy: Secondary | ICD-10-CM | POA: Insufficient documentation

## 2018-12-02 DIAGNOSIS — R0602 Shortness of breath: Secondary | ICD-10-CM | POA: Insufficient documentation

## 2018-12-02 DIAGNOSIS — Z0389 Encounter for observation for other suspected diseases and conditions ruled out: Secondary | ICD-10-CM | POA: Diagnosis not present

## 2018-12-02 DIAGNOSIS — R0789 Other chest pain: Secondary | ICD-10-CM | POA: Diagnosis not present

## 2018-12-02 LAB — CBC
HCT: 40.2 % (ref 36.0–46.0)
Hemoglobin: 11.9 g/dL — ABNORMAL LOW (ref 12.0–15.0)
MCH: 21.2 pg — ABNORMAL LOW (ref 26.0–34.0)
MCHC: 29.6 g/dL — ABNORMAL LOW (ref 30.0–36.0)
MCV: 71.5 fL — ABNORMAL LOW (ref 80.0–100.0)
Platelets: 496 10*3/uL — ABNORMAL HIGH (ref 150–400)
RBC: 5.62 MIL/uL — ABNORMAL HIGH (ref 3.87–5.11)
RDW: 16.5 % — ABNORMAL HIGH (ref 11.5–15.5)
WBC: 7.8 10*3/uL (ref 4.0–10.5)
nRBC: 0 % (ref 0.0–0.2)

## 2018-12-02 LAB — BASIC METABOLIC PANEL
Anion gap: 11 (ref 5–15)
BUN: 8 mg/dL (ref 6–20)
CO2: 21 mmol/L — ABNORMAL LOW (ref 22–32)
Calcium: 9 mg/dL (ref 8.9–10.3)
Chloride: 105 mmol/L (ref 98–111)
Creatinine, Ser: 0.62 mg/dL (ref 0.44–1.00)
GFR calc Af Amer: >60 mL/min (ref >60)
GFR calc non Af Amer: >60 mL/min (ref >60)
Glucose, Bld: 107 mg/dL — ABNORMAL HIGH (ref 70–99)
Potassium: 3.7 mmol/L (ref 3.5–5.1)
Sodium: 137 mmol/L (ref 135–145)

## 2018-12-02 LAB — POCT PREGNANCY, URINE: Preg Test, Ur: NEGATIVE

## 2018-12-02 LAB — TROPONIN I (HIGH SENSITIVITY): Troponin I (High Sensitivity): <2 ng/L (ref <18)

## 2018-12-02 MED ORDER — PREDNISONE 50 MG PO TABS: 50.0000 mg | ORAL_TABLET | Freq: Every day | ORAL | 0 refills | Status: DC

## 2018-12-02 MED ORDER — IOHEXOL 350 MG/ML SOLN
100.0000 mL | Freq: Once | INTRAVENOUS | Status: AC | PRN
  Administered 2018-12-02: 100 mL via INTRAVENOUS
  Filled 2018-12-02: qty 100

## 2018-12-02 MED ORDER — ALBUTEROL SULFATE HFA 108 (90 BASE) MCG/ACT IN AERS: 2.0000 | INHALATION_SPRAY | Freq: Four times a day (QID) | RESPIRATORY_TRACT | 0 refills | Status: DC | PRN

## 2018-12-02 MED ORDER — IPRATROPIUM-ALBUTEROL 0.5-2.5 (3) MG/3ML IN SOLN
3.0000 mL | Freq: Once | RESPIRATORY_TRACT | Status: AC
  Administered 2018-12-02: 3 mL via RESPIRATORY_TRACT
  Filled 2018-12-02: qty 3

## 2018-12-02 NOTE — ED Triage Notes (Signed)
Pt reports chest pressure and SOB since yesterday. Pt reports pain is mid chest and non radiating. Pt denies fevers, cough, congestion or other symptoms.

## 2018-12-02 NOTE — Telephone Encounter (Signed)
Agree with plan to go to ED.

## 2018-12-02 NOTE — Telephone Encounter (Signed)
Pt called with complaints of shortness of breath, and chest pressure like someone is pressing on her chest; the pt says that on 12/01/2018 she found that she was "taking deep long breaths to catch up"; she denies chest or nasal congestion, or cough; recommendations made per nurse triage protocol; she verbalized understanding, and will proceed to the ED; the pt sees Kathrine Haddock, [facility]; will route to office for notification.  Reason for Disposition . [1] MODERATE difficulty breathing (e.g., speaks in phrases, SOB even at rest, pulse 100-120) AND [2] NEW-onset or WORSE than normal  Answer Assessment - Initial Assessment Questions 1. RESPIRATORY STATUS: "Describe your breathing?" (e.g., wheezing, shortness of breath, unable to speak, severe coughing)      Shortness of breath 2. ONSET: "When did this breathing problem begin?"      12/01/2018 3. PATTERN "Does the difficult breathing come and go, or has it been constant since it started?"      constant 4. SEVERITY: "How bad is your breathing?" (e.g., mild, moderate, severe)    - MILD: No SOB at rest, mild SOB with walking, speaks normally in sentences, can lay down, no retractions, pulse < 100.    - MODERATE: SOB at rest, SOB with minimal exertion and prefers to sit, cannot lie down flat, speaks in phrases, mild retractions, audible wheezing, pulse 100-120.    - SEVERE: Very SOB at rest, speaks in single words, struggling to breathe, sitting hunched forward, retractions, pulse > 120    moderate 5. RECURRENT SYMPTOM: "Have you had difficulty breathing before?" If so, ask: "When was the last time?" and "What happened that time?"     no 6. CARDIAC HISTORY: "Do you have any history of heart disease?" (e.g., heart attack, angina, bypass surgery, angioplasty)      no 7. LUNG HISTORY: "Do you have any history of lung disease?"  (e.g., pulmonary embolus, asthma, emphysema)    no 8. CAUSE: "What do you think is causing the breathing problem?"    Not sure 9. OTHER SYMPTOMS: "Do you have any other symptoms? (e.g., dizziness, runny nose, cough, chest pain, fever)     Chest pressure like someone is pressing on her chest 10. PREGNANCY: "Is there any chance you are pregnant?" "When was your last menstrual period?"      No BTL  11. TRAVEL: "Have you traveled out of the country in the last month?" (e.g., travel history, exposures)      No  Protocols used: BREATHING DIFFICULTY-A-AH

## 2018-12-02 NOTE — ED Provider Notes (Signed)
[facility] Emergency Department Provider Note   ____________________________________________    I have reviewed the triage vital signs and the nursing notes.   HISTORY  Chief Complaint Chest Pain and Shortness of Breath     HPI Diana Sexton is a 33 y.o. female who presents with complaints of chest pain and shortness of breath.  She describes a tightening in her chest in the left anterior chest wall which started yesterday.  She describes a mild cough but no congestion or fevers.  No sick contacts noted.  Some mild pleurisy.  Reports a history of DVTs in the past.  No exposure to positive coronavirus patients that she knows of.  Feels like she needs to get deeper breath than usual, not worse with exertion   Past Medical History:  Diagnosis Date  . Anemia   . Anxiety   . Cellulitis 2012   caused stillbirth  . DVT (deep venous thrombosis) ([place]) 06/2011   under right clavicle  . Dysmenorrhea   . Family history of breast cancer 11/2013   BRCA/MyRisk neg  . Headache    related to hormonal meds  . History of kidney stones   . History of prothrombin mutation   . Increased risk of breast cancer 11/2013   IBIS=27%  . Obesity     Patient Active Problem List   Diagnosis Date Noted  . DVT (deep venous thrombosis) ([place]) 02/09/2018  . Menorrhagia with irregular cycle 10/13/2016  . Other irritable bowel syndrome 05/02/2016  . Urinary tract infection 01/21/2015  . Anxiety 01/21/2015  . Nephrolithiasis 01/15/2015  . Abnormal radiologic findings on diagnostic imaging of renal pelvis, ureter, or bladder 01/15/2015  . Low HDL (under 40) 01/01/2015  . Elevated LDL cholesterol level 01/01/2015  . Insomnia 12/16/2014  . Morbid obesity ([place]) 12/11/2014  . Hematuria 12/11/2014  . Preventative health care 12/11/2014  . Low back pain 10/13/2014  . Prothrombin gene mutation ([place]) 10/13/2014  . Hydronephrosis 06/07/2012  . Incomplete emptying of bladder  06/07/2012  . Renal colic 40/98/1191  . Ureteric stone 06/07/2012    Past Surgical History:  Procedure Laterality Date  . CHOLECYSTECTOMY    . CYSTOSCOPY W/ RETROGRADES Left 02/02/2017   Procedure: CYSTOSCOPY WITH RETROGRADE PYELOGRAM;  Surgeon: Abbie Sons, MD;  Location: [facility] ORS;  Service: Urology;  Laterality: Left;  . CYSTOSCOPY/URETEROSCOPY/HOLMIUM LASER/STENT PLACEMENT Left 02/02/2017   Procedure: CYSTOSCOPY/URETEROSCOPY/HOLMIUM LASER/STENT PLACEMENT;  Surgeon: Abbie Sons, MD;  Location: [facility] ORS;  Service: Urology;  Laterality: Left;  . DILITATION & CURRETTAGE/HYSTROSCOPY WITH ESSURE    . INTRAUTERINE DEVICE (IUD) INSERTION    . LITHOTRIPSY    . TONSILLECTOMY    . TONSILLECTOMY AND ADENOIDECTOMY    . TUBAL LIGATION      Prior to Admission medications   Medication Sig Start Date End Date Taking? Authorizing Provider  albuterol (VENTOLIN HFA) 108 (90 Base) MCG/ACT inhaler Inhale 2 puffs into the lungs every 6 (six) hours as needed for wheezing or shortness of breath. 12/02/18   Lavonia Drafts, MD  medroxyPROGESTERone (PROVERA) 10 MG tablet Take 1 tablet (10 mg total) by mouth daily for 10 days. 10/29/18 11/08/18  Will Bonnet, MD  predniSONE (DELTASONE) 50 MG tablet Take 1 tablet (50 mg total) by mouth daily with breakfast. 12/02/18   Lavonia Drafts, MD     Allergies Penicillins  Family History  Problem Relation Age of Onset  . Cancer Mother        breast  .  Diabetes Father   . Hypertension Father   . Heart disease Maternal Grandfather   . Dementia Paternal Grandmother   . Bladder Cancer Neg Hx   . Kidney cancer Neg Hx     Social History Social History   Tobacco Use  . Smoking status: Never Smoker  . Smokeless tobacco: Never Used  Substance Use Topics  . Alcohol use: Yes    Comment: 1 drink per month  . Drug use: No    Review of Systems  Constitutional: No fever/chills Eyes: No visual changes.  ENT: No sore throat. Cardiovascular: As  above Respiratory: As above Gastrointestinal: No abdominal pain.  No nausea, no vomiting.   Genitourinary: Negative for dysuria. Musculoskeletal: Negative for back pain. Skin: Negative for rash. Neurological: Negative for headaches or weakness   ____________________________________________   PHYSICAL EXAM:  VITAL SIGNS: ED Triage Vitals  Enc Vitals Group     BP 12/02/18 1108 136/65     Pulse Rate 12/02/18 1108 82     Resp 12/02/18 1108 (!) 21     Temp 12/02/18 1108 98.5 F (36.9 C)     Temp Source 12/02/18 1108 Oral     SpO2 12/02/18 1108 100 %     Weight 12/02/18 1104 136.1 kg (300 lb)     Height 12/02/18 1104 1.651 m (_0 )     Head Circumference --      Peak Flow --      Pain Score 12/02/18 1104 4     Pain Loc --      Pain Edu? --      Excl. in [place]? --     Constitutional: Alert and oriented.   Nose: No congestion/rhinnorhea.  Cardiovascular: Normal rate, regular rhythm. Grossly normal heart sounds.  Good peripheral circulation. Respiratory: Normal respiratory effort.  No retractions. Lungs CTAB. Gastrointestinal: Soft and nontender. No distention.  No CVA tenderness.  Musculoskeletal: No lower extremity tenderness nor edema.  Warm and well perfused Neurologic:  Normal speech and language. No gross focal neurologic deficits are appreciated.  Skin:  Skin is warm, dry and intact. No rash noted. Psychiatric: Mood and affect are normal. Speech and behavior are normal.  ____________________________________________   LABS (all labs ordered are listed, but only abnormal results are displayed)  Labs Reviewed  BASIC METABOLIC PANEL - Abnormal; Notable for the following components:      Result Value   CO2 21 (*)    Glucose, Bld 107 (*)    All other components within normal limits  CBC - Abnormal; Notable for the following components:   RBC 5.62 (*)    Hemoglobin 11.9 (*)    MCV 71.5 (*)    MCH 21.2 (*)    MCHC 29.6 (*)    RDW 16.5 (*)    Platelets 496 (*)     All other components within normal limits  POCT PREGNANCY, URINE  TROPONIN I (HIGH SENSITIVITY)   ____________________________________________  EKG  ED ECG REPORT I, Lavonia Drafts, the attending physician, personally viewed and interpreted this ECG.  Date: 12/02/2018  Rhythm: normal sinus rhythm QRS Axis: normal Intervals: normal ST/T Wave abnormalities: normal Narrative Interpretation: no evidence of acute ischemia  ____________________________________________  RADIOLOGY  Chest x-ray unremarkable CT angiography is negative for PE or pneumonia ____________________________________________   PROCEDURES  Procedure(s) performed: No  Procedures   Critical Care performed: No ____________________________________________   INITIAL IMPRESSION / ASSESSMENT AND PLAN / ED COURSE  Pertinent labs & imaging results that were available during  my care of the patient were reviewed by me and considered in my medical decision making (see chart for details).  Patient presents with mild shortness of breath and chest discomfort however symptoms have mostly resolved now.  She continues to feel like she needs to take a deep breath.  If she takes a very deep breath she feels a discomfort in her chest, she is concerned given her history of DVT.  Lab work, x-ray is quite reassuring, not consistent with ACS given normal EKG, normal troponin.  Will send for CTA given history and treatment DuoNeb for possible bronchospasm and she notes that her mother has been smoking close to the house  Patient feels  better after DuoNeb, CTA is negative.  She is appropriate for discharge at this time, will start her on prednisone, butyryl inhaler, return precautions discussed, follow-up with PCP    ____________________________________________   FINAL CLINICAL IMPRESSION(S) / ED DIAGNOSES  Final diagnoses:  Shortness of breath  Bronchospasm        Note:  This document was prepared using Dragon voice  recognition software and may include unintentional dictation errors.   Lavonia Drafts, MD 12/02/18 1534

## 2018-12-10 ENCOUNTER — Other Ambulatory Visit: Payer: Self-pay

## 2018-12-10 ENCOUNTER — Encounter: Payer: BC Managed Care – PPO | Admitting: Obstetrics and Gynecology

## 2018-12-10 ENCOUNTER — Ambulatory Visit (INDEPENDENT_AMBULATORY_CARE_PROVIDER_SITE_OTHER): Payer: BC Managed Care – PPO | Admitting: Obstetrics and Gynecology

## 2018-12-10 VITALS — BP 128/88 | Ht 65.0 in | Wt 330.0 lb

## 2018-12-10 DIAGNOSIS — N921 Excessive and frequent menstruation with irregular cycle: Secondary | ICD-10-CM

## 2018-12-10 NOTE — Progress Notes (Signed)
Preoperative History and Physical  Diana Sexton is a 33 y.o. [id number] here for surgical management of menorrhagia with irregular cycle.   No significant preoperative concerns.  History of Present Illness: 33 y.o. G9E0100 female with a long history of heavy menses.  In the past she was taking Provera.10 days per month.  August through December she had no menses.  She started her menses in December. At this point she had another blood clot in her right arm.  She was on no blood thinning medication at all during the August through December time-frame.  She started in [place] in December.  She took the medication for about 5 months.  She was told she did not need to take the medication any longer.  In June she had a normal period.  Her period stopped for about a week and she re-started and has been bleeding since.  She is forming really large clots at this time. She is requiring a pad and tampon at the same time.  She has not been taking Provera for about a year.  At that point she had been having periods that didn't really line up to when she was taking the medication.  The last pap smear I can find on record was about 5 years ago. It was normal.  She has a history of having an IUD. She had nearly-constant pelvic pain and she formed ovarian cysts.    Pelvic ultrasound was normal.  Pap smear: NILM, HPV negative STD screen negative Endometrial Biopsy: negative  Proposed surgery: Hysteroscopy, dilation and curettage, endometrial ablation.   Past Medical History:  Diagnosis Date   Anemia    Anxiety    Cellulitis 2012   caused stillbirth   DVT (deep venous thrombosis) ([place]) 06/2011   under right clavicle   Dysmenorrhea    Family history of breast cancer 11/2013   BRCA/MyRisk neg   Headache    related to hormonal meds   History of kidney stones    History of prothrombin mutation    Increased risk of breast cancer 11/2013   IBIS=27%   Obesity    Past Surgical History:    Procedure Laterality Date   CHOLECYSTECTOMY     CYSTOSCOPY W/ RETROGRADES Left 02/02/2017   Procedure: CYSTOSCOPY WITH RETROGRADE PYELOGRAM;  Surgeon: Abbie Sons, MD;  Location: [facility] ORS;  Service: Urology;  Laterality: Left;   CYSTOSCOPY/URETEROSCOPY/HOLMIUM LASER/STENT PLACEMENT Left 02/02/2017   Procedure: CYSTOSCOPY/URETEROSCOPY/HOLMIUM LASER/STENT PLACEMENT;  Surgeon: Abbie Sons, MD;  Location: [facility] ORS;  Service: Urology;  Laterality: Left;   DILITATION & CURRETTAGE/HYSTROSCOPY WITH ESSURE     INTRAUTERINE DEVICE (IUD) INSERTION     LITHOTRIPSY     TONSILLECTOMY     TONSILLECTOMY AND ADENOIDECTOMY     TUBAL LIGATION     OB History  Gravida Para Term Preterm AB Living  _0 SAB TAB Ectopic Multiple Live Births  3       2    # Outcome Date GA Lbr Len/2nd Weight Sex Delivery Anes PTL Lv  5 Term 03/05/12    F Vag-Spont   LIV  4 Term 11/29/09    M Vag-Spont   LIV  3 SAB           2 SAB           1 SAB           Patient denies any other pertinent gynecologic issues.   Current  Outpatient Medications on File Prior to Visit  Medication Sig Dispense Refill   albuterol (VENTOLIN HFA) 108 (90 Base) MCG/ACT inhaler Inhale 2 puffs into the lungs every 6 (six) hours as needed for wheezing or shortness of breath. 6.7 g 0   medroxyPROGESTERone (PROVERA) 10 MG tablet Take 1 tablet (10 mg total) by mouth daily for 10 days. 10 tablet 0   predniSONE (DELTASONE) 50 MG tablet Take 1 tablet (50 mg total) by mouth daily with breakfast. 5 tablet 0   No current facility-administered medications on file prior to visit.    Allergies  Allergen Reactions   Penicillins Rash    Has patient had a PCN reaction causing immediate rash, facial/tongue/throat swelling, SOB or lightheadedness with hypotension: Yes Has patient had a PCN reaction causing severe rash involving mucus membranes or skin necrosis: Yes Has patient had a PCN reaction that required hospitalization:  No Has patient had a PCN reaction occurring within the last 10 years: Yes If all of the above answers are "NO", then may proceed with Cephalosporin use.     Social History:   reports that she has never smoked. She has never used smokeless tobacco. She reports current alcohol use. She reports that she does not use drugs.  Family History  Problem Relation Age of Onset   Cancer Mother        breast   Diabetes Father    Hypertension Father    Heart disease Maternal Grandfather    Dementia Paternal Grandmother    Bladder Cancer Neg Hx    Kidney cancer Neg Hx     Review of Systems: Noncontributory  PHYSICAL EXAM: Blood pressure 128/88, height _0  (1.651 m), weight (!) 330 lb (149.7 kg). CONSTITUTIONAL: Well-developed, well-nourished female in no acute distress.  HENT:  Normocephalic, atraumatic, External right and left ear normal. Oropharynx is clear and moist EYES: Conjunctivae and EOM are normal. Pupils are equal, round, and reactive to light. No scleral icterus.  NECK: Normal range of motion, supple, no masses SKIN: Skin is warm and dry. No rash noted. Not diaphoretic. No erythema. No pallor. [place]: Alert and oriented to person, place, and time. Normal reflexes, muscle tone coordination. No cranial nerve deficit noted. PSYCHIATRIC: Normal mood and affect. Normal behavior. Normal judgment and thought content. CARDIOVASCULAR: Normal heart rate noted, regular rhythm RESPIRATORY: Effort and breath sounds normal, no problems with respiration noted ABDOMEN: Soft, nontender, nondistended. PELVIC: Deferred MUSCULOSKELETAL: Normal range of motion. No edema and no tenderness. 2+ distal pulses.  Labs: Results for orders placed or performed during the hospital encounter of 12/02/18 (from the past 336 hour(s))  Basic metabolic panel   Collection Time: 12/02/18 11:33 AM  Result Value Ref Range   Sodium 137 135 - 145 mmol/L   Potassium 3.7 3.5 - 5.1 mmol/L   Chloride 105 98 -  111 mmol/L   CO2 21 (L) 22 - 32 mmol/L   Glucose, Bld 107 (H) 70 - 99 mg/dL   BUN 8 6 - 20 mg/dL   Creatinine, Ser 0.62 0.44 - 1.00 mg/dL   Calcium 9.0 8.9 - 10.3 mg/dL   GFR calc non Af Amer >60 >60 mL/min   GFR calc Af Amer >60 >60 mL/min   Anion gap 11 5 - 15  CBC   Collection Time: 12/02/18 11:33 AM  Result Value Ref Range   WBC 7.8 4.0 - 10.5 K/uL   RBC 5.62 (H) 3.87 - 5.11 MIL/uL   Hemoglobin 11.9 (L) 12.0 - 15.0  g/dL   HCT 40.2 36.0 - 46.0 %   MCV 71.5 (L) 80.0 - 100.0 fL   MCH 21.2 (L) 26.0 - 34.0 pg   MCHC 29.6 (L) 30.0 - 36.0 g/dL   RDW 16.5 (H) 11.5 - 15.5 %   Platelets 496 (H) 150 - 400 K/uL   nRBC 0.0 0.0 - 0.2 %  Troponin I (High Sensitivity)   Collection Time: 12/02/18 11:33 AM  Result Value Ref Range   Troponin I (High Sensitivity) <2 <18 ng/L  Pregnancy, urine POC   Collection Time: 12/02/18 11:42 AM  Result Value Ref Range   Preg Test, Ur NEGATIVE NEGATIVE    Imaging Studies: Dg Chest 2 View  Result Date: 12/02/2018 CLINICAL DATA:  Pt reports chest pressure and SOB since yesterday. Pt reports pain is mid chest and non radiating. Pt denies fevers, cough, congestion or other symptoms EXAM: CHEST - 2 VIEW COMPARISON:  Chest radiograph 05/02/2018 FINDINGS: The heart size and mediastinal contours are within normal limits. The lungs are clear. No pneumothorax or pleural effusion. The visualized skeletal structures are unremarkable. IMPRESSION: No evidence of active disease in the chest. Electronically Signed   By: Audie Pinto M.D.   On: 12/02/2018 11:19   Ct Angio Chest Pe W And/or Wo Contrast  Result Date: 12/02/2018 CLINICAL DATA:  PE suspected EXAM: CT ANGIOGRAPHY CHEST WITH CONTRAST TECHNIQUE: Multidetector CT imaging of the chest was performed using the standard protocol during bolus administration of intravenous contrast. Multiplanar CT image reconstructions and MIPs were obtained to evaluate the vascular anatomy. CONTRAST:  155m OMNIPAQUE IOHEXOL 350  MG/ML SOLN COMPARISON:  05/02/2018 FINDINGS: Cardiovascular: Satisfactory opacification of the pulmonary arteries to the segmental level. No evidence of pulmonary embolism. Normal heart size. No pericardial effusion. Mediastinum/Nodes: No enlarged mediastinal, hilar, or axillary lymph nodes. Age-appropriate thymic remnant in the anterior mediastinum. Thyroid gland, trachea, and esophagus demonstrate no significant findings. Lungs/Pleura: Lungs are clear. No pleural effusion or pneumothorax. Upper Abdomen: No acute abnormality. Musculoskeletal: No chest wall abnormality. No acute or significant osseous findings. Review of the MIP images confirms the above findings. IMPRESSION: Negative examination for pulmonary embolism. Electronically Signed   By: AEddie CandleM.D.   On: 12/02/2018 14:29    Assessment: Patient Active Problem List   Diagnosis Date Noted   [place] 02[place]   Menorrhagia with irregular cycle 10/13/2016    Plan: Patient will undergo surgical management with the above-noted surgery.   The risks of surgery were discussed in detail with the patient including but not limited to: bleeding which may require transfusion or reoperation; infection which may require antibiotics; injury to surrounding organs which may involve bowel, bladder, ureters ; need for additional procedures including laparoscopy or laparotomy; thromboembolic phenomenon, surgical site problems and other postoperative/anesthesia complications. Likelihood of success in alleviating the patient's condition was discussed. Routine postoperative instructions will be reviewed with the patient and her family in detail after surgery.  The patient concurred with the proposed plan, giving informed written consent for the surgery.  Preoperative prophylactic antibiotics, as indicated, and SCDs ordered on call to the OR.    SPrentice Docker MD 12/10/2018 3:26 PM

## 2018-12-10 NOTE — H&P (View-Only) (Signed)
Preoperative History and Physical  Diana Sexton is a 33 y.o. [id number] here for surgical management of menorrhagia with irregular cycle.   No significant preoperative concerns.  History of Present Illness: 33 y.o. G9E0100 female with a long history of heavy menses.  In the past she was taking Provera.10 days per month.  August through December she had no menses.  She started her menses in December. At this point she had another blood clot in her right arm.  She was on no blood thinning medication at all during the August through December time-frame.  She started in [place] in December.  She took the medication for about 5 months.  She was told she did not need to take the medication any longer.  In June she had a normal period.  Her period stopped for about a week and she re-started and has been bleeding since.  She is forming really large clots at this time. She is requiring a pad and tampon at the same time.  She has not been taking Provera for about a year.  At that point she had been having periods that didn't really line up to when she was taking the medication.  The last pap smear I can find on record was about 5 years ago. It was normal.  She has a history of having an IUD. She had nearly-constant pelvic pain and she formed ovarian cysts.    Pelvic ultrasound was normal.  Pap smear: NILM, HPV negative STD screen negative Endometrial Biopsy: negative  Proposed surgery: Hysteroscopy, dilation and curettage, endometrial ablation.   Past Medical History:  Diagnosis Date   Anemia    Anxiety    Cellulitis 2012   caused stillbirth   DVT (deep venous thrombosis) ([place]) 06/2011   under right clavicle   Dysmenorrhea    Family history of breast cancer 11/2013   BRCA/MyRisk neg   Headache    related to hormonal meds   History of kidney stones    History of prothrombin mutation    Increased risk of breast cancer 11/2013   IBIS=27%   Obesity    Past Surgical History:    Procedure Laterality Date   CHOLECYSTECTOMY     CYSTOSCOPY W/ RETROGRADES Left 02/02/2017   Procedure: CYSTOSCOPY WITH RETROGRADE PYELOGRAM;  Surgeon: Abbie Sons, MD;  Location: [facility] ORS;  Service: Urology;  Laterality: Left;   CYSTOSCOPY/URETEROSCOPY/HOLMIUM LASER/STENT PLACEMENT Left 02/02/2017   Procedure: CYSTOSCOPY/URETEROSCOPY/HOLMIUM LASER/STENT PLACEMENT;  Surgeon: Abbie Sons, MD;  Location: [facility] ORS;  Service: Urology;  Laterality: Left;   DILITATION & CURRETTAGE/HYSTROSCOPY WITH ESSURE     INTRAUTERINE DEVICE (IUD) INSERTION     LITHOTRIPSY     TONSILLECTOMY     TONSILLECTOMY AND ADENOIDECTOMY     TUBAL LIGATION     OB History  Gravida Para Term Preterm AB Living  _0 SAB TAB Ectopic Multiple Live Births  3       2    # Outcome Date GA Lbr Len/2nd Weight Sex Delivery Anes PTL Lv  5 Term 03/05/12    F Vag-Spont   LIV  4 Term 11/29/09    M Vag-Spont   LIV  3 SAB           2 SAB           1 SAB           Patient denies any other pertinent gynecologic issues.   Current  Outpatient Medications on File Prior to Visit  Medication Sig Dispense Refill   albuterol (VENTOLIN HFA) 108 (90 Base) MCG/ACT inhaler Inhale 2 puffs into the lungs every 6 (six) hours as needed for wheezing or shortness of breath. 6.7 g 0   medroxyPROGESTERone (PROVERA) 10 MG tablet Take 1 tablet (10 mg total) by mouth daily for 10 days. 10 tablet 0   predniSONE (DELTASONE) 50 MG tablet Take 1 tablet (50 mg total) by mouth daily with breakfast. 5 tablet 0   No current facility-administered medications on file prior to visit.    Allergies  Allergen Reactions   Penicillins Rash    Has patient had a PCN reaction causing immediate rash, facial/tongue/throat swelling, SOB or lightheadedness with hypotension: Yes Has patient had a PCN reaction causing severe rash involving mucus membranes or skin necrosis: Yes Has patient had a PCN reaction that required hospitalization:  No Has patient had a PCN reaction occurring within the last 10 years: Yes If all of the above answers are "NO", then may proceed with Cephalosporin use.     Social History:   reports that she has never smoked. She has never used smokeless tobacco. She reports current alcohol use. She reports that she does not use drugs.  Family History  Problem Relation Age of Onset   Cancer Mother        breast   Diabetes Father    Hypertension Father    Heart disease Maternal Grandfather    Dementia Paternal Grandmother    Bladder Cancer Neg Hx    Kidney cancer Neg Hx     Review of Systems: Noncontributory  PHYSICAL EXAM: Blood pressure 128/88, height _0  (1.651 m), weight (!) 330 lb (149.7 kg). CONSTITUTIONAL: Well-developed, well-nourished female in no acute distress.  HENT:  Normocephalic, atraumatic, External right and left ear normal. Oropharynx is clear and moist EYES: Conjunctivae and EOM are normal. Pupils are equal, round, and reactive to light. No scleral icterus.  NECK: Normal range of motion, supple, no masses SKIN: Skin is warm and dry. No rash noted. Not diaphoretic. No erythema. No pallor. [place]: Alert and oriented to person, place, and time. Normal reflexes, muscle tone coordination. No cranial nerve deficit noted. PSYCHIATRIC: Normal mood and affect. Normal behavior. Normal judgment and thought content. CARDIOVASCULAR: Normal heart rate noted, regular rhythm RESPIRATORY: Effort and breath sounds normal, no problems with respiration noted ABDOMEN: Soft, nontender, nondistended. PELVIC: Deferred MUSCULOSKELETAL: Normal range of motion. No edema and no tenderness. 2+ distal pulses.  Labs: Results for orders placed or performed during the hospital encounter of 12/02/18 (from the past 336 hour(s))  Basic metabolic panel   Collection Time: 12/02/18 11:33 AM  Result Value Ref Range   Sodium 137 135 - 145 mmol/L   Potassium 3.7 3.5 - 5.1 mmol/L   Chloride 105 98 -  111 mmol/L   CO2 21 (L) 22 - 32 mmol/L   Glucose, Bld 107 (H) 70 - 99 mg/dL   BUN 8 6 - 20 mg/dL   Creatinine, Ser 0.62 0.44 - 1.00 mg/dL   Calcium 9.0 8.9 - 10.3 mg/dL   GFR calc non Af Amer >60 >60 mL/min   GFR calc Af Amer >60 >60 mL/min   Anion gap 11 5 - 15  CBC   Collection Time: 12/02/18 11:33 AM  Result Value Ref Range   WBC 7.8 4.0 - 10.5 K/uL   RBC 5.62 (H) 3.87 - 5.11 MIL/uL   Hemoglobin 11.9 (L) 12.0 - 15.0  g/dL   HCT 40.2 36.0 - 46.0 %   MCV 71.5 (L) 80.0 - 100.0 fL   MCH 21.2 (L) 26.0 - 34.0 pg   MCHC 29.6 (L) 30.0 - 36.0 g/dL   RDW 16.5 (H) 11.5 - 15.5 %   Platelets 496 (H) 150 - 400 K/uL   nRBC 0.0 0.0 - 0.2 %  Troponin I (High Sensitivity)   Collection Time: 12/02/18 11:33 AM  Result Value Ref Range   Troponin I (High Sensitivity) <2 <18 ng/L  Pregnancy, urine POC   Collection Time: 12/02/18 11:42 AM  Result Value Ref Range   Preg Test, Ur NEGATIVE NEGATIVE    Imaging Studies: Dg Chest 2 View  Result Date: 12/02/2018 CLINICAL DATA:  Pt reports chest pressure and SOB since yesterday. Pt reports pain is mid chest and non radiating. Pt denies fevers, cough, congestion or other symptoms EXAM: CHEST - 2 VIEW COMPARISON:  Chest radiograph 05/02/2018 FINDINGS: The heart size and mediastinal contours are within normal limits. The lungs are clear. No pneumothorax or pleural effusion. The visualized skeletal structures are unremarkable. IMPRESSION: No evidence of active disease in the chest. Electronically Signed   By: Audie Pinto M.D.   On: 12/02/2018 11:19   Ct Angio Chest Pe W And/or Wo Contrast  Result Date: 12/02/2018 CLINICAL DATA:  PE suspected EXAM: CT ANGIOGRAPHY CHEST WITH CONTRAST TECHNIQUE: Multidetector CT imaging of the chest was performed using the standard protocol during bolus administration of intravenous contrast. Multiplanar CT image reconstructions and MIPs were obtained to evaluate the vascular anatomy. CONTRAST:  155m OMNIPAQUE IOHEXOL 350  MG/ML SOLN COMPARISON:  05/02/2018 FINDINGS: Cardiovascular: Satisfactory opacification of the pulmonary arteries to the segmental level. No evidence of pulmonary embolism. Normal heart size. No pericardial effusion. Mediastinum/Nodes: No enlarged mediastinal, hilar, or axillary lymph nodes. Age-appropriate thymic remnant in the anterior mediastinum. Thyroid gland, trachea, and esophagus demonstrate no significant findings. Lungs/Pleura: Lungs are clear. No pleural effusion or pneumothorax. Upper Abdomen: No acute abnormality. Musculoskeletal: No chest wall abnormality. No acute or significant osseous findings. Review of the MIP images confirms the above findings. IMPRESSION: Negative examination for pulmonary embolism. Electronically Signed   By: AEddie CandleM.D.   On: 12/02/2018 14:29    Assessment: Patient Active Problem List   Diagnosis Date Noted   [place]   Menorrhagia with irregular cycle 10/13/2016    Plan: Patient will undergo surgical management with the above-noted surgery.   The risks of surgery were discussed in detail with the patient including but not limited to: bleeding which may require transfusion or reoperation; infection which may require antibiotics; injury to surrounding organs which may involve bowel, bladder, ureters ; need for additional procedures including laparoscopy or laparotomy; thromboembolic phenomenon, surgical site problems and other postoperative/anesthesia complications. Likelihood of success in alleviating the patient's condition was discussed. Routine postoperative instructions will be reviewed with the patient and her family in detail after surgery.  The patient concurred with the proposed plan, giving informed written consent for the surgery.  Preoperative prophylactic antibiotics, as indicated, and SCDs ordered on call to the OR.    SPrentice Docker MD 12/10/2018 3:26 PM

## 2018-12-11 ENCOUNTER — Encounter: Payer: Self-pay | Admitting: Obstetrics and Gynecology

## 2018-12-20 ENCOUNTER — Encounter
Admission: RE | Admit: 2018-12-20 | Discharge: 2018-12-20 | Disposition: A | Discharge status: Home / Self Care | Payer: BC Managed Care – PPO | Source: Ambulatory Visit | Attending: Obstetrics and Gynecology | Admitting: Obstetrics and Gynecology

## 2018-12-20 ENCOUNTER — Other Ambulatory Visit: Payer: Self-pay

## 2018-12-20 DIAGNOSIS — Z20828 Contact with and (suspected) exposure to other viral communicable diseases: Secondary | ICD-10-CM | POA: Diagnosis not present

## 2018-12-20 DIAGNOSIS — Z01818 Encounter for other preprocedural examination: Secondary | ICD-10-CM | POA: Diagnosis not present

## 2018-12-20 DIAGNOSIS — Z86718 Personal history of other venous thrombosis and embolism: Secondary | ICD-10-CM | POA: Diagnosis not present

## 2018-12-20 HISTORY — DX: Personal history of other venous thrombosis and embolism: Z86.718

## 2018-12-20 LAB — CBC
HCT: 39.2 % (ref 36.0–46.0)
Hemoglobin: 11.9 g/dL — ABNORMAL LOW (ref 12.0–15.0)
MCH: 21.7 pg — ABNORMAL LOW (ref 26.0–34.0)
MCHC: 30.4 g/dL (ref 30.0–36.0)
MCV: 71.4 fL — ABNORMAL LOW (ref 80.0–100.0)
Platelets: 417 10*3/uL — ABNORMAL HIGH (ref 150–400)
RBC: 5.49 MIL/uL — ABNORMAL HIGH (ref 3.87–5.11)
RDW: 17.9 % — ABNORMAL HIGH (ref 11.5–15.5)
WBC: 7.6 10*3/uL (ref 4.0–10.5)
nRBC: 0 % (ref 0.0–0.2)

## 2018-12-20 LAB — COMPREHENSIVE METABOLIC PANEL
ALT: 20 U/L (ref 0–44)
AST: 20 U/L (ref 15–41)
Albumin: 3.5 g/dL (ref 3.5–5.0)
Alkaline Phosphatase: 67 U/L (ref 38–126)
Anion gap: 10 (ref 5–15)
BUN: 11 mg/dL (ref 6–20)
CO2: 25 mmol/L (ref 22–32)
Calcium: 9.3 mg/dL (ref 8.9–10.3)
Chloride: 102 mmol/L (ref 98–111)
Creatinine, Ser: 0.57 mg/dL (ref 0.44–1.00)
GFR calc Af Amer: >60 mL/min (ref >60)
GFR calc non Af Amer: >60 mL/min (ref >60)
Glucose, Bld: 99 mg/dL (ref 70–99)
Potassium: 4.1 mmol/L (ref 3.5–5.1)
Sodium: 137 mmol/L (ref 135–145)
Total Bilirubin: 0.4 mg/dL (ref 0.3–1.2)
Total Protein: 8 g/dL (ref 6.5–8.1)

## 2018-12-20 LAB — SARS CORONAVIRUS 2 (TAT 6-24 HRS): SARS Coronavirus 2: NEGATIVE

## 2018-12-20 LAB — TYPE AND SCREEN
ABO/RH(D): AB POS
Antibody Screen: NEGATIVE

## 2018-12-20 NOTE — Patient Instructions (Addendum)
Your procedure is scheduled on: 12/24/2018 Tues Report to Same Day Surgery 2nd floor medical mall [facility] Entrance-take elevator on left to 2nd floor.  Check in with surgery information desk.) To find out your arrival time please call [phone] between 1PM - 3PM on 12/23/2018 Mon  Remember: Instructions that are not followed completely may result in serious medical risk, up to and including death, or upon the discretion of your surgeon and anesthesiologist your surgery may need to be rescheduled.    _x___ 1. Do not eat food after midnight the night before your procedure. You may drink clear liquids up to 2 hours before you are scheduled to arrive at the hospital for your procedure.  Do not drink clear liquids within 2 hours of your scheduled arrival to the hospital.  Clear liquids include  --Water or Apple juice without pulp  --Clear carbohydrate beverage such as ClearFast or Gatorade  --Black Coffee or Clear Tea (No milk, no creamers, do not add anything to                  the coffee or Tea Type 1 and type 2 diabetics should only drink water.   ____Ensure clear carbohydrate drink on the way to the hospital for bariatric patients  x____Ensure clear carbohydrate drink 3 hours before surgery. Complete 2 hours before coming to hospital.   No gum chewing or hard candies.     __x__ 2. No Alcohol for 24 hours before or after surgery.   __x__3. No Smoking or e-cigarettes for 24 prior to surgery.  Do not use any chewable tobacco products for at least 6 hour prior to surgery   ____  4. Bring all medications with you on the day of surgery if instructed.    __x__ 5. Notify your doctor if there is any change in your medical condition     (cold, fever, infections).    x___6. On the morning of surgery brush your teeth with toothpaste and water.  You may rinse your mouth with mouth wash if you wish.  Do not swallow any toothpaste or mouthwash.   Do not wear jewelry, make-up, hairpins,  clips or nail polish.  Do not wear lotions, powders, or perfumes. You may wear deodorant.  Do not shave 48 hours prior to surgery. Men may shave face and neck.  Do not bring valuables to the hospital.    [facility] is not responsible for any belongings or valuables.               Contacts, dentures or bridgework may not be worn into surgery.  Leave your suitcase in the car. After surgery it may be brought to your room.  For patients admitted to the hospital, discharge time is determined by your                       treatment team.  _  Patients discharged the day of surgery will not be allowed to drive home.  You will need someone to drive you home and stay with you the night of your procedure.    Please read over the following fact sheets that you were given:   [facility] Preparing for Surgery and or MRSA Information   _x___ Take anti-hypertensive listed below, cardiac, seizure, asthma,     anti-reflux and psychiatric medicines. These include:  1. albuterol (VENTOLIN HFA) 108 (90 Base) MCG/ACT inhaler if needed  2.  3.  4.  5.  6.  ____Fleets enema or Magnesium Citrate as directed.   _x___ Use CHG Soap or sage wipes as directed on instruction sheet   _x___ Use inhalers on the day of surgery and bring to hospital day of surgery  ____ Stop Metformin and Janumet 2 days prior to surgery.    ____ Take 1/2 of usual insulin dose the night before surgery and none on the morning     surgery.   _x___ Follow recommendations from Cardiologist, Pulmonologist or PCP regarding          stopping Aspirin, Coumadin, Plavix ,Eliquis, Effient, or Pradaxa, and Pletal.  X____Stop Anti-inflammatories such as Advil, Aleve, Ibuprofen, Motrin, Naproxen, Naprosyn, Goodies powders or aspirin products. OK to take Tylenol and                          Celebrex.   _x___ Stop supplements until after surgery.  But may continue Vitamin D, Vitamin B,       and multivitamin.   ____ Bring C-Pap to the  hospital.

## 2018-12-20 NOTE — Pre-Procedure Instructions (Signed)
Carbohydrate drink given along with instructions.

## 2018-12-24 ENCOUNTER — Ambulatory Visit
Admission: RE | Admit: 2018-12-24 | Discharge: 2018-12-24 | Disposition: A | Discharge status: Home / Self Care | Payer: BC Managed Care – PPO | Attending: Obstetrics and Gynecology | Admitting: Obstetrics and Gynecology

## 2018-12-24 ENCOUNTER — Ambulatory Visit: Payer: BC Managed Care – PPO | Admitting: Registered Nurse

## 2018-12-24 ENCOUNTER — Encounter
Admission: RE | Disposition: A | Discharge status: Home / Self Care | Payer: Self-pay | Source: Home / Self Care | Attending: Obstetrics and Gynecology

## 2018-12-24 ENCOUNTER — Other Ambulatory Visit: Payer: Self-pay

## 2018-12-24 DIAGNOSIS — D6852 Prothrombin gene mutation: Secondary | ICD-10-CM | POA: Diagnosis present

## 2018-12-24 DIAGNOSIS — Z86718 Personal history of other venous thrombosis and embolism: Secondary | ICD-10-CM | POA: Diagnosis not present

## 2018-12-24 DIAGNOSIS — N921 Excessive and frequent menstruation with irregular cycle: Secondary | ICD-10-CM | POA: Diagnosis present

## 2018-12-24 HISTORY — PX: DILITATION & CURRETTAGE/HYSTROSCOPY WITH NOVASURE ABLATION: SHX5568

## 2018-12-24 LAB — POCT PREGNANCY, URINE: Preg Test, Ur: NEGATIVE

## 2018-12-24 LAB — ABO/RH: ABO/RH(D): AB POS

## 2018-12-24 SURGERY — DILATATION & CURETTAGE/HYSTEROSCOPY WITH NOVASURE ABLATION
Anesthesia: Choice

## 2018-12-24 MED ORDER — FENTANYL CITRATE (PF) 100 MCG/2ML IJ SOLN
25.0000 ug | INTRAMUSCULAR | Status: DC | PRN
  Administered 2018-12-24 (×3): 25 ug via INTRAVENOUS

## 2018-12-24 MED ORDER — MIDAZOLAM HCL 2 MG/2ML IJ SOLN
INTRAMUSCULAR | Status: AC
  Filled 2018-12-24: qty 2

## 2018-12-24 MED ORDER — FENTANYL CITRATE (PF) 100 MCG/2ML IJ SOLN
INTRAMUSCULAR | Status: AC
  Filled 2018-12-24: qty 2

## 2018-12-24 MED ORDER — LACTATED RINGERS IV SOLN
INTRAVENOUS | Status: DC
  Administered 2018-12-24: 07:00:00 via INTRAVENOUS

## 2018-12-24 MED ORDER — ONDANSETRON HCL 4 MG/2ML IJ SOLN
INTRAMUSCULAR | Status: DC | PRN
  Administered 2018-12-24: 4 mg via INTRAVENOUS

## 2018-12-24 MED ORDER — PROPOFOL 10 MG/ML IV BOLUS
INTRAVENOUS | Status: DC | PRN
  Administered 2018-12-24: 200 mg via INTRAVENOUS

## 2018-12-24 MED ORDER — FAMOTIDINE 20 MG PO TABS
ORAL_TABLET | ORAL | Status: AC
  Administered 2018-12-24: 07:00:00 20 mg via ORAL
  Filled 2018-12-24: qty 1

## 2018-12-24 MED ORDER — DEXAMETHASONE SODIUM PHOSPHATE 10 MG/ML IJ SOLN
INTRAMUSCULAR | Status: DC | PRN
  Administered 2018-12-24: 10 mg via INTRAVENOUS

## 2018-12-24 MED ORDER — FENTANYL CITRATE (PF) 100 MCG/2ML IJ SOLN
INTRAMUSCULAR | Status: AC
  Administered 2018-12-24: 25 ug via INTRAVENOUS
  Filled 2018-12-24: qty 2

## 2018-12-24 MED ORDER — PROPOFOL 10 MG/ML IV BOLUS
INTRAVENOUS | Status: AC
  Filled 2018-12-24: qty 20

## 2018-12-24 MED ORDER — DEXMEDETOMIDINE HCL IN NACL 80 MCG/20ML IV SOLN
INTRAVENOUS | Status: AC
  Filled 2018-12-24: qty 20

## 2018-12-24 MED ORDER — IBUPROFEN 600 MG PO TABS
ORAL_TABLET | ORAL | Status: AC
  Filled 2018-12-24: qty 1

## 2018-12-24 MED ORDER — SUCCINYLCHOLINE CHLORIDE 20 MG/ML IJ SOLN
INTRAMUSCULAR | Status: DC | PRN
  Administered 2018-12-24: 100 mg via INTRAVENOUS

## 2018-12-24 MED ORDER — SILVER NITRATE-POT NITRATE 75-25 % EX MISC
CUTANEOUS | Status: DC | PRN
  Administered 2018-12-24: 1

## 2018-12-24 MED ORDER — SUCCINYLCHOLINE CHLORIDE 20 MG/ML IJ SOLN
INTRAMUSCULAR | Status: AC
  Filled 2018-12-24: qty 1

## 2018-12-24 MED ORDER — MIDAZOLAM HCL 2 MG/2ML IJ SOLN
INTRAMUSCULAR | Status: DC | PRN
  Administered 2018-12-24: 2 mg via INTRAVENOUS

## 2018-12-24 MED ORDER — SILVER NITRATE-POT NITRATE 75-25 % EX MISC
CUTANEOUS | Status: AC
  Filled 2018-12-24: qty 10

## 2018-12-24 MED ORDER — LIDOCAINE HCL (PF) 2 % IJ SOLN
INTRAMUSCULAR | Status: AC
  Filled 2018-12-24: qty 10

## 2018-12-24 MED ORDER — DEXAMETHASONE SODIUM PHOSPHATE 10 MG/ML IJ SOLN
INTRAMUSCULAR | Status: AC
  Filled 2018-12-24: qty 1

## 2018-12-24 MED ORDER — HYDROCODONE-ACETAMINOPHEN 5-325 MG PO TABS: 1.0000 | ORAL_TABLET | Freq: Three times a day (TID) | ORAL | 0 refills | Status: DC | PRN

## 2018-12-24 MED ORDER — IBUPROFEN 600 MG PO TABS
600.0000 mg | ORAL_TABLET | Freq: Four times a day (QID) | ORAL | Status: DC | PRN
  Administered 2018-12-24: 600 mg via ORAL
  Filled 2018-12-24: qty 1

## 2018-12-24 MED ORDER — LIDOCAINE HCL (CARDIAC) PF 100 MG/5ML IV SOSY
PREFILLED_SYRINGE | INTRAVENOUS | Status: DC | PRN
  Administered 2018-12-24: 50 mg via INTRAVENOUS

## 2018-12-24 MED ORDER — ENOXAPARIN SODIUM 40 MG/0.4ML ~~LOC~~ SOLN
40.0000 mg | SUBCUTANEOUS | Status: AC
  Administered 2018-12-24: 40 mg via SUBCUTANEOUS
  Filled 2018-12-24 (×2): qty 0.4

## 2018-12-24 MED ORDER — KETOROLAC TROMETHAMINE 30 MG/ML IJ SOLN
INTRAMUSCULAR | Status: DC | PRN
  Administered 2018-12-24: 30 mg via INTRAVENOUS

## 2018-12-24 MED ORDER — FENTANYL CITRATE (PF) 100 MCG/2ML IJ SOLN
INTRAMUSCULAR | Status: DC | PRN
  Administered 2018-12-24 (×2): 50 ug via INTRAVENOUS
  Administered 2018-12-24: 100 ug via INTRAVENOUS

## 2018-12-24 MED ORDER — KETOROLAC TROMETHAMINE 30 MG/ML IJ SOLN
INTRAMUSCULAR | Status: AC
  Filled 2018-12-24: qty 1

## 2018-12-24 MED ORDER — IBUPROFEN 600 MG PO TABS: 600.0000 mg | ORAL_TABLET | Freq: Four times a day (QID) | ORAL | 0 refills | Status: DC | PRN

## 2018-12-24 MED ORDER — DEXMEDETOMIDINE HCL 200 MCG/2ML IV SOLN
INTRAVENOUS | Status: DC | PRN
  Administered 2018-12-24 (×2): 20 ug via INTRAVENOUS

## 2018-12-24 MED ORDER — ONDANSETRON HCL 4 MG/2ML IJ SOLN: 4.0000 mg | Freq: Once | INTRAMUSCULAR | Status: DC | PRN

## 2018-12-24 MED ORDER — FAMOTIDINE 20 MG PO TABS
20.0000 mg | ORAL_TABLET | Freq: Once | ORAL | Status: AC
  Administered 2018-12-24: 07:00:00 20 mg via ORAL

## 2018-12-24 MED ORDER — ONDANSETRON HCL 4 MG/2ML IJ SOLN
INTRAMUSCULAR | Status: AC
  Filled 2018-12-24: qty 2

## 2018-12-24 SURGICAL SUPPLY — 30 items
BAG PRESSURE INFUS OVAL 1000CC (MISCELLANEOUS) ×3 IMPLANT
BAG URINE DRAIN 2000ML AR STRL (UROLOGICAL SUPPLIES) IMPLANT
CANISTER SUCT 3000ML PPV (MISCELLANEOUS) ×3 IMPLANT
CATH FOLEY 2WAY  5CC 16FR (CATHETERS)
CATH ROBINSON RED A/P 16FR (CATHETERS) ×3 IMPLANT
CATH URTH 16FR FL 2W BLN LF (CATHETERS) IMPLANT
COVER WAND RF STERILE (DRAPES) ×3 IMPLANT
DEVICE MYOSURE LITE (MISCELLANEOUS) IMPLANT
ELECT REM PT RETURN 9FT ADLT (ELECTROSURGICAL) ×3
ELECTRODE REM PT RTRN 9FT ADLT (ELECTROSURGICAL) ×1 IMPLANT
GLOVE BIO SURGEON STRL SZ7 (GLOVE) ×3 IMPLANT
GLOVE BIOGEL PI IND STRL 7.5 (GLOVE) ×1 IMPLANT
GLOVE BIOGEL PI INDICATOR 7.5 (GLOVE) ×2
GOWN STRL REUS W/ TWL LRG LVL3 (GOWN DISPOSABLE) ×2 IMPLANT
GOWN STRL REUS W/TWL LRG LVL3 (GOWN DISPOSABLE) ×4
HANDPIECE ABLA MINERVA ENDO (MISCELLANEOUS) ×3 IMPLANT
IV LACTATED RINGERS 1000ML (IV SOLUTION) ×3 IMPLANT
IV NS 1000ML (IV SOLUTION) ×2
IV NS 1000ML BAXH (IV SOLUTION) ×1 IMPLANT
IV NS IRRIG 3000ML ARTHROMATIC (IV SOLUTION) IMPLANT
KIT PROCEDURE FLUENT (KITS) IMPLANT
KIT TURNOVER CYSTO (KITS) ×3 IMPLANT
MYOSURE XL FIBROID (MISCELLANEOUS)
PACK DNC HYST (MISCELLANEOUS) ×3 IMPLANT
PAD OB MATERNITY 4.3X12.25 (PERSONAL CARE ITEMS) ×3 IMPLANT
PAD PREP 24X41 OB/GYN DISP (PERSONAL CARE ITEMS) ×3 IMPLANT
SEAL ROD LENS SCOPE MYOSURE (ABLATOR) ×3 IMPLANT
SYSTEM TISS REMOVAL MYOSURE XL (MISCELLANEOUS) IMPLANT
TUBING CONNECTING 10 (TUBING) ×2 IMPLANT
TUBING CONNECTING 10' (TUBING) ×1

## 2018-12-24 NOTE — Interval H&P Note (Signed)
History and Physical Interval Note:  12/24/2018 7:21 AM  Diana Sexton  has presented today for surgery, with the diagnosis of Menorrhagia with irregular cycle N92.1, Prothrombin Gene Mutation D68.52.  The various methods of treatment have been discussed with the patient and family. After consideration of risks, benefits and other options for treatment, the patient has consented to  Procedure(s): DILATATION & CURETTAGE/HYSTEROSCOPY WITH ENDOMETRIAL ABLATION as a surgical intervention.  The patient's history has been reviewed, patient examined, no change in status, stable for surgery.  I have reviewed the patient's chart and labs.  Questions were answered to the patient's satisfaction.    Prentice Docker, MD, [facility], [facility] 12/24/2018 7:21 AM

## 2018-12-24 NOTE — Discharge Instructions (Signed)

## 2018-12-24 NOTE — Anesthesia Preprocedure Evaluation (Signed)
Anesthesia Evaluation  Patient identified by MRN, date of birth, ID band Patient awake    Reviewed: Allergy & Precautions, NPO status , Patient's Chart, lab work & pertinent test results  History of Anesthesia Complications Negative for: history of anesthetic complications  Airway Mallampati: II  TM Distance: >3 FB Neck ROM: Full    Dental no notable dental hx.    Pulmonary neg pulmonary ROS, neg sleep apnea, neg COPD,    breath sounds clear to auscultation- rhonchi (-) wheezing      Cardiovascular Exercise Tolerance: Good (-) hypertension(-) CAD, (-) Past MI, (-) Cardiac Stents and (-) CABG  Rhythm:Regular Rate:Normal - Systolic murmurs and - Diastolic murmurs    Neuro/Psych  Headaches, neg Seizures Anxiety    GI/Hepatic negative GI ROS, Neg liver ROS,   Endo/Other  negative endocrine ROSneg diabetes  Renal/GU Renal disease: hx of nephrolithiasis.     Musculoskeletal negative musculoskeletal ROS (+)   Abdominal (+) + obese,   Peds  Hematology  (+) anemia ,   Anesthesia Other Findings Past Medical History: No date: Anemia No date: Anxiety 2012: Cellulitis     Comment:  caused stillbirth 06/2011: DVT (deep venous thrombosis) ([place])     Comment:  under right clavicle No date: Dysmenorrhea No date: Dyspnea 11/2013: Family history of breast cancer     Comment:  BRCA/MyRisk neg No date: H/O blood clots No date: Headache     Comment:  related to hormonal meds No date: History of kidney stones No date: History of prothrombin mutation 11/2013: Increased risk of breast cancer     Comment:  IBIS=27% No date: Obesity   Reproductive/Obstetrics                             Anesthesia Physical Anesthesia Plan  ASA: II  Anesthesia Plan: General   Post-op Pain Management:    Induction: Intravenous  PONV Risk Score and Plan: 2 and Ondansetron and Dexamethasone  Airway Management  Planned: Oral ETT  Additional Equipment:   Intra-op Plan:   Post-operative Plan: Extubation in OR  Informed Consent: I have reviewed the patients History and Physical, chart, labs and discussed the procedure including the risks, benefits and alternatives for the proposed anesthesia with the patient or authorized representative who has indicated his/her understanding and acceptance.     Dental advisory given  Plan Discussed with: CRNA and Anesthesiologist  Anesthesia Plan Comments:         Anesthesia Quick Evaluation

## 2018-12-24 NOTE — Anesthesia Post-op Follow-up Note (Signed)
Anesthesia QCDR form completed.        

## 2018-12-24 NOTE — OR Nursing (Signed)
Pt tolerating ginger ale, no s/s of nausea.  VSS, NAD.

## 2018-12-24 NOTE — Anesthesia Postprocedure Evaluation (Signed)
Anesthesia Post Note  Patient: Kalyiah B Bierlein  Procedure(s) Performed: DILATATION & CURETTAGE/HYSTEROSCOPY WITH ENDOMETRIAL ABLATION (MINERVA SYSTEM) (N/A )  Patient location during evaluation: PACU Anesthesia Type: General Level of consciousness: awake and alert and oriented Pain management: pain level controlled Vital Signs Assessment: post-procedure vital signs reviewed and stable Respiratory status: spontaneous breathing, nonlabored ventilation and respiratory function stable Cardiovascular status: blood pressure returned to baseline and stable Postop Assessment: no signs of nausea or vomiting Anesthetic complications: no     Last Vitals:  Vitals:   12/24/18 0918 12/24/18 0932  BP: 122/64 121/70  Pulse: 71 70  Resp: 14 14  Temp: (!) 36.2 C   SpO2: 97% 97%    Last Pain:  Vitals:   12/24/18 0932  TempSrc:   PainSc: 3                  Magen Suriano

## 2018-12-24 NOTE — Anesthesia Procedure Notes (Signed)
Procedure Name: Intubation Date/Time: 12/24/2018 7:37 AM Performed by: Jonna Clark, CRNA Pre-anesthesia Checklist: Patient identified, Patient being monitored, Timeout performed, Emergency Drugs available and Suction available Patient Re-evaluated:Patient Re-evaluated prior to induction Oxygen Delivery Method: Circle system utilized Preoxygenation: Pre-oxygenation with 100% oxygen Induction Type: IV induction Ventilation: Mask ventilation without difficulty Laryngoscope Size: 3 and McGraph Grade View: Grade I Tube type: Oral Tube size: 7.0 mm Number of attempts: 1 Airway Equipment and Method: Stylet Placement Confirmation: ETT inserted through vocal cords under direct vision,  positive ETCO2 and breath sounds checked- equal and bilateral Secured at: 21 cm Tube secured with: Tape Dental Injury: Teeth and Oropharynx as per pre-operative assessment

## 2018-12-24 NOTE — Op Note (Signed)
Operative Note   03/30/2015  PRE-OP DIAGNOSIS:  1) Menorrhagia with irregular cycle (N92.1) 2) Prothrombin Gene Mutation [id number])  POST-OP DIAGNOSIS:  1) Menorrhagia with irregular cycle (N92.1) 2) Prothrombin Gene Mutation [id number])  SURGEON: Will Bonnet, MD  PROCEDURE:  1) Hysteroscopy 2 Dilation and Curettage 3) Endometrial ablation with Minerva  ANESTHESIA: General   ESTIMATED BLOOD LOSS: 5 mL   IV Fluid: 500 mL crystalloid  SPECIMENS: endometrial curettings  FLUID DEFICIT: min (no more than 200 mL of fluid instilled into uterus  COMPLICATIONS: none   DISPOSITION: PACU - hemodynamically stable.   CONDITION: stable   FINDINGS: Exam under anesthesia revealed small, mobile uterus with no masses and bilateral adnexa without masses or fullness. Hysteroscopy revealed grossly normal appearing uterine cavity with bilateral tubal ostia and normal appearing endocervical canal. Findings after ablation revealed globally ablated endometrium.  PROCEDURE IN DETAIL: After informed consent was obtained, the patient was taken to the operating room where anesthesia was obtained without difficulty. The patient was positioned in the dorsal lithotomy position in [place]. The patient's bladder was catheterized with an in-and-out foley catheter. The patient was examined under anesthesia, with the above noted findings. The bivalved speculum was placed inside the patient's vagina, and the the anterior lip of the cervix was seen and grasped with the tenaculum. The uterine cavity was sounded to 9.5 cm, and then the cervix was progressively dilated to a 63mm using Hegar dilators. The cervix was measured to be 4.0 cm.  The 30 degree hysteroscope was introduced, with LR fluid used to distend the intrauterine cavity, with the above noted findings.   The hystersocope was removed and the uterine cavity was curetted until a gritty texture was noted, yielding endometrial curettings.    The Minerva  device was then placed without difficulty. Patient was noted to have a uterine length of 5.5 cm. The Minerva device was first tested and after confirmation the procedures performed. Length of procedure was the full 120 seconds. The Minerva device is then removed and repeat hysteroscopy reveals an appropriate lining of the uterus and no perforation or injury. Hysteroscope is removed with minimal discrepancy of fluid.   Tenaculum was removed with excellent hemostasis noted after application of silver nitrate to the tenaculum entry sites. She was then taken out of dorsal lithotomy. Hemostasis noted.  The patient tolerated the procedure well. Sponge, lap and needle counts were correct x2. The patient was taken to recovery room in excellent condition. For VTE prophylaxis she was used SCDs and was given Lovenox 40 mg [place] prior to the procedure. No antibiotic prophylaxis was utilized as none was indicated.    Prentice Docker, MD 12/24/2018 8:21 AM

## 2018-12-24 NOTE — Transfer of Care (Signed)
Immediate Anesthesia Transfer of Care Note  Patient: Diana Sexton  Procedure(s) Performed: DILATATION & CURETTAGE/HYSTEROSCOPY WITH ENDOMETRIAL ABLATION (MINERVA SYSTEM) (N/A )  Patient Location: PACU  Anesthesia Type:General  Level of Consciousness: drowsy and patient cooperative  Airway & Oxygen Therapy: Patient Spontanous Breathing and Patient connected to face mask oxygen  Post-op Assessment: Report given to RN and Post -op Vital signs reviewed and stable  Post vital signs: Reviewed and stable  Last Vitals:  Vitals Value Taken Time  BP 104/62 12/24/18 0828  Temp    Pulse 65 12/24/18 0829  Resp 15 12/24/18 0829  SpO2 98 % 12/24/18 0829  Vitals shown include unvalidated device data.  Last Pain:  Vitals:   12/24/18 0609  TempSrc: Temporal  PainSc: 0-No pain         Complications: No apparent anesthesia complications

## 2018-12-25 ENCOUNTER — Encounter: Payer: Self-pay | Admitting: Obstetrics and Gynecology

## 2018-12-25 LAB — SURGICAL PATHOLOGY

## 2019-01-14 ENCOUNTER — Ambulatory Visit (INDEPENDENT_AMBULATORY_CARE_PROVIDER_SITE_OTHER): Payer: BC Managed Care – PPO | Admitting: Obstetrics and Gynecology

## 2019-01-14 ENCOUNTER — Other Ambulatory Visit: Payer: Self-pay

## 2019-01-14 ENCOUNTER — Encounter: Payer: Self-pay | Admitting: Obstetrics and Gynecology

## 2019-01-14 VITALS — BP 128/88 | Ht 65.0 in | Wt 330.0 lb

## 2019-01-14 DIAGNOSIS — N921 Excessive and frequent menstruation with irregular cycle: Secondary | ICD-10-CM | POA: Diagnosis not present

## 2019-01-14 DIAGNOSIS — Z09 Encounter for follow-up examination after completed treatment for conditions other than malignant neoplasm: Secondary | ICD-10-CM

## 2019-01-14 NOTE — Progress Notes (Signed)
   Postoperative Follow-up Patient presents post op from hysteroscopy, dilation and curettage, endometrial ablation 1 month ago for menorrhagia with irregular cycle.  Subjective: Patient reports marked improvement in her preop symptoms. Eating a regular diet without difficulty. The patient is not having any pain.  Activity: normal activities of daily living.  Objective: Vitals:   01/14/19 0907  BP: 128/88   Vital Signs: BP 128/88   Ht 5\' 5"  (1.651 m)   Wt (!) 330 lb (149.7 kg)   BMI 54.91 kg/m  Physical Exam Constitutional:      General: She is not in acute distress.    Appearance: Normal appearance.  HENT:     Head: Normocephalic and atraumatic.  Eyes:     General: No scleral icterus.    Conjunctiva/sclera: Conjunctivae normal.  Neurological:     General: No focal deficit present.     Mental Status: She is alert and oriented to person, place, and time.     Cranial Nerves: No cranial nerve deficit.  Psychiatric:        Mood and Affect: Mood normal.        Behavior: Behavior normal.        Judgment: Judgment normal.      Assessment: 33 y.o. s/p hysteroscopy, D&C, endometrial ablation progressing well  Plan: Patient has done well after surgery with no apparent complications.  I have discussed the post-operative course to date, and the expected progress moving forward.  The patient understands what complications to be concerned about.  I will see the patient in routine follow up, or sooner if needed.    Activity plan: No restriction.  Prentice Docker, MD 01/14/2019, 9:18 AM

## 2019-03-23 DIAGNOSIS — H66002 Acute suppurative otitis media without spontaneous rupture of ear drum, left ear: Secondary | ICD-10-CM | POA: Diagnosis not present

## 2019-04-08 ENCOUNTER — Emergency Department
Admission: EM | Admit: 2019-04-08 | Discharge: 2019-04-08 | Disposition: A | Discharge status: Home / Self Care | Payer: BC Managed Care – PPO | Attending: Emergency Medicine | Admitting: Emergency Medicine

## 2019-04-08 ENCOUNTER — Encounter: Payer: Self-pay | Admitting: Emergency Medicine

## 2019-04-08 ENCOUNTER — Emergency Department: Payer: BC Managed Care – PPO

## 2019-04-08 ENCOUNTER — Other Ambulatory Visit: Payer: Self-pay

## 2019-04-08 DIAGNOSIS — Y9301 Activity, walking, marching and hiking: Secondary | ICD-10-CM | POA: Insufficient documentation

## 2019-04-08 DIAGNOSIS — S8992XA Unspecified injury of left lower leg, initial encounter: Secondary | ICD-10-CM | POA: Diagnosis not present

## 2019-04-08 DIAGNOSIS — S83002A Unspecified subluxation of left patella, initial encounter: Secondary | ICD-10-CM | POA: Diagnosis not present

## 2019-04-08 DIAGNOSIS — S79912A Unspecified injury of left hip, initial encounter: Secondary | ICD-10-CM | POA: Diagnosis not present

## 2019-04-08 DIAGNOSIS — W000XXA Fall on same level due to ice and snow, initial encounter: Secondary | ICD-10-CM | POA: Diagnosis not present

## 2019-04-08 DIAGNOSIS — Y999 Unspecified external cause status: Secondary | ICD-10-CM | POA: Insufficient documentation

## 2019-04-08 DIAGNOSIS — S83012A Lateral subluxation of left patella, initial encounter: Secondary | ICD-10-CM | POA: Diagnosis not present

## 2019-04-08 DIAGNOSIS — Y929 Unspecified place or not applicable: Secondary | ICD-10-CM | POA: Insufficient documentation

## 2019-04-08 DIAGNOSIS — S8392XA Sprain of unspecified site of left knee, initial encounter: Secondary | ICD-10-CM | POA: Insufficient documentation

## 2019-04-08 DIAGNOSIS — W19XXXA Unspecified fall, initial encounter: Secondary | ICD-10-CM

## 2019-04-08 DIAGNOSIS — W009XXA Unspecified fall due to ice and snow, initial encounter: Secondary | ICD-10-CM

## 2019-04-08 MED ORDER — TRAMADOL HCL 50 MG PO TABS: 50.0000 mg | ORAL_TABLET | Freq: Four times a day (QID) | ORAL | 0 refills | Status: DC | PRN

## 2019-04-08 MED ORDER — IBUPROFEN 600 MG PO TABS: 600.0000 mg | ORAL_TABLET | Freq: Three times a day (TID) | ORAL | 0 refills | Status: DC | PRN

## 2019-04-08 NOTE — ED Provider Notes (Signed)
[facility] Emergency Department Provider Note   ____________________________________________   First MD Initiated Contact with Patient 04/08/19 0940     (approximate)  I have reviewed the triage vital signs and the nursing notes.   HISTORY  Chief Complaint No chief complaint on file.   HPI Diana Sexton is a 34 y.o. female presents to the ED after falling this morning walking down a ramp.  Patient states that she is having pain to her left knee but is able to bear weight and has continued to ambulate.  Patient denies any head injury or loss of consciousness.  She denies any previous injury to her left knee.  She rates her pain as a 5/10.       Past Medical History:  Diagnosis Date  . Anemia   . Anxiety   . Cellulitis 2012   caused stillbirth  . DVT (deep venous thrombosis) ([place]) 06/2011   under right clavicle  . Dysmenorrhea   . Dyspnea   . Family history of breast cancer 11/2013   BRCA/MyRisk neg  . H/O blood clots   . Headache    related to hormonal meds  . History of kidney stones   . History of prothrombin mutation   . Increased risk of breast cancer 11/2013   IBIS=27%  . Obesity     Patient Active Problem List   Diagnosis Date Noted  . DVT (deep venous thrombosis) ([place]) 02/09/2018  . Menorrhagia with irregular cycle 10/13/2016  . Other irritable bowel syndrome 05/02/2016  . Urinary tract infection 01/21/2015  . Anxiety 01/21/2015  . Nephrolithiasis 01/15/2015  . Abnormal radiologic findings on diagnostic imaging of renal pelvis, ureter, or bladder 01/15/2015  . Low HDL (under 40) 01/01/2015  . Elevated LDL cholesterol level 01/01/2015  . Insomnia 12/16/2014  . Morbid obesity ([place]) 12/11/2014  . Hematuria 12/11/2014  . Preventative health care 12/11/2014  . Low back pain 10/13/2014  . Prothrombin gene mutation ([place]) 10/13/2014  . Hydronephrosis 06/07/2012  . Incomplete emptying of bladder 06/07/2012  . Renal colic  91/47/8295  . Ureteric stone 06/07/2012    Past Surgical History:  Procedure Laterality Date  . CHOLECYSTECTOMY    . CYSTOSCOPY W/ RETROGRADES Left 02/02/2017   Procedure: CYSTOSCOPY WITH RETROGRADE PYELOGRAM;  Surgeon: Abbie Sons, MD;  Location: [facility] ORS;  Service: Urology;  Laterality: Left;  . CYSTOSCOPY/URETEROSCOPY/HOLMIUM LASER/STENT PLACEMENT Left 02/02/2017   Procedure: CYSTOSCOPY/URETEROSCOPY/HOLMIUM LASER/STENT PLACEMENT;  Surgeon: Abbie Sons, MD;  Location: [facility] ORS;  Service: Urology;  Laterality: Left;  . DILITATION & CURRETTAGE/HYSTROSCOPY WITH ESSURE    . DILITATION & CURRETTAGE/HYSTROSCOPY WITH NOVASURE ABLATION N/A 12/24/2018   Procedure: DILATATION & CURETTAGE/HYSTEROSCOPY WITH ENDOMETRIAL ABLATION (MINERVA SYSTEM);  Surgeon: Will Bonnet, MD;  Location: [facility] ORS;  Service: Gynecology;  Laterality: N/A;  . INTRAUTERINE DEVICE (IUD) INSERTION    . LITHOTRIPSY    . TONSILLECTOMY    . TONSILLECTOMY AND ADENOIDECTOMY    . TUBAL LIGATION      Prior to Admission medications   Medication Sig Start Date End Date Taking? Authorizing Provider  albuterol (VENTOLIN HFA) 108 (90 Base) MCG/ACT inhaler Inhale 2 puffs into the lungs every 6 (six) hours as needed for wheezing or shortness of breath. 12/02/18   Lavonia Drafts, MD  ibuprofen (ADVIL) 600 MG tablet Take 1 tablet (600 mg total) by mouth every 8 (eight) hours as needed. 04/08/19   Johnn Hai, PA-C  traMADol (ULTRAM) 50 MG tablet Take 1 tablet (  50 mg total) by mouth every 6 (six) hours as needed for moderate pain. 04/08/19   Johnn Hai, PA-C    Allergies Penicillins  Family History  Problem Relation Age of Onset  . Cancer Mother        breast  . Diabetes Father   . Hypertension Father   . Heart disease Maternal Grandfather   . Dementia Paternal Grandmother   . Bladder Cancer Neg Hx   . Kidney cancer Neg Hx     Social History Social History   Tobacco Use  . Smoking status: Never  Smoker  . Smokeless tobacco: Never Used  Substance Use Topics  . Alcohol use: Yes    Comment: 1 drink per month  . Drug use: No    Review of Systems Constitutional: No fever/chills Eyes: No visual changes. ENT: No trauma. Cardiovascular: Denies chest pain. Respiratory: Denies shortness of breath. Gastrointestinal: No abdominal pain.  No nausea, no vomiting. Musculoskeletal: Positive left knee pain, left posterior hip pain. Skin: Negative for rash. Neurological: Negative for headaches, focal weakness or numbness. ____________________________________________   PHYSICAL EXAM:  VITAL SIGNS: ED Triage Vitals  Enc Vitals Group     BP 04/08/19 0930 133/78     Pulse Rate 04/08/19 0930 90     Resp 04/08/19 0930 20     Temp 04/08/19 0930 98.5 F (36.9 C)     Temp Source 04/08/19 0930 Oral     SpO2 04/08/19 0930 97 %     Weight 04/08/19 0927 300 lb (136.1 kg)     Height 04/08/19 0927 5' 5"  (1.651 m)     Head Circumference --      Peak Flow --      Pain Score 04/08/19 0927 5     Pain Loc --      Pain Edu? --      Excl. in [place]? --     Constitutional: Alert and oriented. Well appearing and in no acute distress. Eyes: Conjunctivae are normal.  Head: Atraumatic. Nose: No trauma. Neck: No stridor.   Cardiovascular: Normal rate, regular rhythm. Grossly normal heart sounds.  Good peripheral circulation. Respiratory: Normal respiratory effort.  No retractions. Lungs CTAB. Gastrointestinal: Soft and nontender. No distention.  Musculoskeletal: On examination of the left knee there is no gross deformity but generalized anterior tenderness noted.  Patient is able to flex and extend her knee without difficulty.  Patient is also able to bear weight.  Skin is intact and no injury is noted such as an abrasion.  No effusion is palpable.  There is some tenderness on palpation of the left posterior hip area without deformity.  No injury noted to the skin.  No tenderness noted cervical and thoracic  spine to palpation posteriorly.  Patient is able move upper extremities without any difficulty.  Neurologic:  Normal speech and language. No gross focal neurologic deficits are appreciated. No gait instability. Skin:  Skin is warm, dry and intact. No rash noted. Psychiatric: Mood and affect are normal. Speech and behavior are normal.  ____________________________________________   LABS (all labs ordered are listed, but only abnormal results are displayed)  Labs Reviewed - No data to display RADIOLOGY  Official radiology report(s): DG Knee Complete 4 Views Left  Result Date: 04/08/2019 CLINICAL DATA:  Pain following fall EXAM: LEFT KNEE - COMPLETE 4+ VIEW COMPARISON:  None. FINDINGS: Frontal, lateral, and bilateral oblique views were obtained. There is lateral patellar subluxation. There is no fracture or frank dislocation. No joint  effusion. There is slight joint space narrowing medially. Other joint spaces appear normal. No erosive change. IMPRESSION: 1. Lateral patellar subluxation without dislocation. No fracture. No joint effusion. 2.  Slight narrowing medially.  Other joint spaces appear normal. Electronically Signed   By: Lowella Grip III M.D.   On: 04/08/2019 10:35   DG HIP UNILAT WITH PELVIS 2-3 VIEWS LEFT  Result Date: 04/08/2019 CLINICAL DATA:  Pain following fall EXAM: DG HIP (WITH OR WITHOUT PELVIS) 2-3V LEFT COMPARISON:  None. FINDINGS: There is no evidence of hip fracture or dislocation. There is no evidence of arthropathy or other focal bone abnormality. Frontal pelvis as well as frontal and lateral left hip images were obtained. No fracture or dislocation. Joint spaces appear normal. No erosive change. IMPRESSION: No fracture or dislocation.  No evident arthropathy. Electronically Signed   By: Lowella Grip III M.D.   On: 04/08/2019 10:34    ____________________________________________   PROCEDURES  Procedure(s) performed (including Critical  Care):  Procedures Ace wrap was applied and patient was given crutches.  ____________________________________________   INITIAL IMPRESSION / ASSESSMENT AND PLAN / ED COURSE  As part of my medical decision making, I reviewed the following data within the electronic MEDICAL RECORD NUMBER Notes from prior ED visits and [place] Controlled Substance Database  34 year old female presents to the ED after falling on ice this morning.  Patient states that she was ambulatory after the accident.  She denies any head injury or loss of consciousness.  She complains of pain to her left knee and left posterior hip.  X-rays were negative for acute bony injury however left knee does show a patella subluxation without dislocation.  Patient is able to flex and extend her knee without any difficulty and was ambulatory while in the ED.  A knee and immobilizer was attempted however this did not fit and Ace wraps were applied.  Patient was given crutches.  She was also given a prescription for ibuprofen and tramadol and instructions to ice and elevate as needed for her knee.  She is to follow-up with her PCP or Dr. Rudene Christians if any continued problems with her knee. ____________________________________________   FINAL CLINICAL IMPRESSION(S) / ED DIAGNOSES  Final diagnoses:  Sprain of left knee, unspecified ligament, initial encounter  Fall due to slipping on ice or snow, initial encounter  Patellar subluxation, left, initial encounter     ED Discharge Orders         Ordered    ibuprofen (ADVIL) 600 MG tablet  Every 8 hours PRN     04/08/19 1056    traMADol (ULTRAM) 50 MG tablet  Every 6 hours PRN     04/08/19 1056           Note:  This document was prepared using Dragon voice recognition software and may include unintentional dictation errors.    Johnn Hai, PA-C 04/08/19 1144    Blake Divine, MD 04/08/19 2140

## 2019-04-08 NOTE — Discharge Instructions (Signed)
Follow-up with your primary care provider if any continued problems.  If he continued having problems with your knee then you will need to make an appointment with the orthopedist who is Dr. Truitt Leep.  His contact information is listed on your discharge papers.  Ice and elevation to reduce swelling and help with pain.  Use crutches for support and protection.  The Ace wrap can be removed when you are sleeping.  Ibuprofen as needed for inflammation and pain.

## 2019-04-08 NOTE — ED Notes (Signed)
Pt informed she is up for DC. Calling for ride. Michela Pitcher they would be here in 30 minutes.

## 2019-04-08 NOTE — ED Triage Notes (Signed)
States she fell going down a ramp  Having pain to left knee   States she is able to bear wt

## 2019-04-08 NOTE — ED Notes (Signed)
Pt taken to xray via stretcher  

## 2019-04-14 ENCOUNTER — Other Ambulatory Visit: Payer: Self-pay

## 2019-04-14 ENCOUNTER — Encounter: Payer: Self-pay | Admitting: Nurse Practitioner

## 2019-04-14 ENCOUNTER — Ambulatory Visit (INDEPENDENT_AMBULATORY_CARE_PROVIDER_SITE_OTHER): Payer: BC Managed Care – PPO | Admitting: Nurse Practitioner

## 2019-04-14 VITALS — BP 116/74 | HR 99 | Temp 98.0°F | Ht 65.0 in | Wt 329.2 lb

## 2019-04-14 DIAGNOSIS — F32A Depression, unspecified: Secondary | ICD-10-CM

## 2019-04-14 DIAGNOSIS — S8392XD Sprain of unspecified site of left knee, subsequent encounter: Secondary | ICD-10-CM | POA: Diagnosis not present

## 2019-04-14 DIAGNOSIS — F329 Major depressive disorder, single episode, unspecified: Secondary | ICD-10-CM | POA: Diagnosis not present

## 2019-04-14 DIAGNOSIS — F419 Anxiety disorder, unspecified: Secondary | ICD-10-CM

## 2019-04-14 DIAGNOSIS — S8392XA Sprain of unspecified site of left knee, initial encounter: Secondary | ICD-10-CM | POA: Insufficient documentation

## 2019-04-14 MED ORDER — TRAZODONE HCL 50 MG PO TABS: 25.0000 mg | ORAL_TABLET | Freq: Every evening | ORAL | 0 refills | Status: DC | PRN

## 2019-04-14 NOTE — Progress Notes (Signed)
BP 116/74 (BP Location: Right Arm, Patient Position: Sitting, Cuff Size: Normal)   Pulse 99   Temp 98 F (36.7 C) (Oral)   Ht 5' 5"  (1.651 m)   Wt (!) 329 lb 3.2 oz (149.3 kg)   SpO2 97%   BMI 54.78 kg/m    Subjective:    Patient ID: Diana Sexton, female    DOB: [birth year], 34 y.o.   MRN: [id number]  HPI: Diana Sexton is a 34 y.o. female  Chief Complaint  Patient presents with  . Hospitalization Follow-up  . Knee Sprain   KNEE PAIN Duration: weeks Involved knee: left Mechanism of injury: fall Location:anterior Onset: gradual Severity: uncomfortable  Quality:  throbbing Frequency: intermittent Radiation: no Aggravating factors: notices more when sitting and walking  Alleviating factors: ice, crutches and rest  Status: better Treatments attempted: ice  Relief with NSAIDs?:  mild Weakness with weight bearing or walking: when going down stairs Sensation of giving way: when going down stairs Locking: no Popping: yes; sometimes when walking Bruising: no Swelling: no Redness: no Paresthesias/decreased sensation: no Fevers: no   STRESS/MOOD Mood status: uncontrolled Satisfied with current treatment?: not currently on treatment Symptom severity: moderate  Duration of current treatment : not currently on treatment Side effects: n/a Medication compliance: n/a Psychotherapy/counseling: not currently in the past  Previous psychiatric medications: trazadone Depressed mood: no Anxious mood: yes Anhedonia: no Significant weight loss or gain: no Insomnia: yes hard to fall asleep Fatigue: yes Feelings of worthlessness or guilt: yes; feels mother does not treat well Impaired concentration/indecisiveness: no Suicidal ideations: no Hopelessness: no Crying spells: yes Depression screen [facility] 2/9 04/14/2019 01/28/2015 12/11/2014  Decreased Interest 1 1 0  Down, Depressed, Hopeless 1 0 1  PHQ - 2 Score 2 1 1   Altered sleeping 3 - -  Tired, decreased energy 1 - -    Change in appetite 2 - -  Feeling bad or failure about yourself  1 - -  Trouble concentrating 1 - -  Moving slowly or fidgety/restless 0 - -  Suicidal thoughts 0 - -  PHQ-9 Score 10 - -  Difficult doing work/chores Somewhat difficult - -    Allergies  Allergen Reactions  . Penicillins Rash    Has patient had a PCN reaction causing immediate rash, facial/tongue/throat swelling, SOB or lightheadedness with hypotension: Yes Has patient had a PCN reaction causing severe rash involving mucus membranes or skin necrosis: Yes Has patient had a PCN reaction that required hospitalization: No Has patient had a PCN reaction occurring within the last 10 years: Yes If all of the above answers are "NO", then may proceed with Cephalosporin use.    Outpatient Encounter Medications as of 04/14/2019  Medication Sig Note  . albuterol (VENTOLIN HFA) 108 (90 Base) MCG/ACT inhaler Inhale 2 puffs into the lungs every 6 (six) hours as needed for wheezing or shortness of breath. (Patient not taking: Reported on 04/14/2019) 04/14/2019: PRN only  . ibuprofen (ADVIL) 600 MG tablet Take 1 tablet (600 mg total) by mouth every 8 (eight) hours as needed. (Patient not taking: Reported on 04/14/2019) 04/14/2019: PRN only  . traZODone (DESYREL) 50 MG tablet Take 0.5-1 tablets (25-50 mg total) by mouth at bedtime as needed for sleep (and anxiety).   . [DISCONTINUED] traMADol (ULTRAM) 50 MG tablet Take 1 tablet (50 mg total) by mouth every 6 (six) hours as needed for moderate pain. (Patient not taking: Reported on 04/14/2019)    No facility-administered encounter medications on  file as of 04/14/2019.   Patient Active Problem List   Diagnosis Date Noted  . Sprain of left knee 04/14/2019  . DVT (deep venous thrombosis) ([place]) 02/09/2018  . Menorrhagia with irregular cycle 10/13/2016  . Other irritable bowel syndrome 05/02/2016  . Urinary tract infection 01/21/2015  . Anxiety and depression 01/21/2015  . Nephrolithiasis 01/15/2015   . Abnormal radiologic findings on diagnostic imaging of renal pelvis, ureter, or bladder 01/15/2015  . Low HDL (under 40) 01/01/2015  . Elevated LDL cholesterol level 01/01/2015  . Insomnia 12/16/2014  . Morbid obesity ([place]) 12/11/2014  . Hematuria 12/11/2014  . Preventative health care 12/11/2014  . Low back pain 10/13/2014  . Prothrombin gene mutation ([place]) 10/13/2014  . Hydronephrosis 06/07/2012  . Incomplete emptying of bladder 06/07/2012  . Renal colic 67/20/9470  . Ureteric stone 06/07/2012   Past Medical History:  Diagnosis Date  . Anemia   . Anxiety   . Cellulitis 2012   caused stillbirth  . DVT (deep venous thrombosis) ([place]) 06/2011   under right clavicle  . Dysmenorrhea   . Dyspnea   . Family history of breast cancer 11/2013   BRCA/MyRisk neg  . H/O blood clots   . Headache    related to hormonal meds  . History of kidney stones   . History of prothrombin mutation   . Increased risk of breast cancer 11/2013   IBIS=27%  . Obesity    Review of Systems  Constitutional: Negative.  Negative for appetite change and fever.  Respiratory: Negative.   Cardiovascular: Negative.   Musculoskeletal: Positive for arthralgias. Negative for back pain, gait problem and joint swelling.  Skin: Negative.   Neurological: Negative.  Negative for dizziness, weakness and headaches.  Psychiatric/Behavioral: Positive for sleep disturbance. Negative for agitation, behavioral problems, confusion, decreased concentration and suicidal ideas. The patient is nervous/anxious. The patient is not hyperactive.    Per HPI unless specifically indicated above     Objective:    BP 116/74 (BP Location: Right Arm, Patient Position: Sitting, Cuff Size: Normal)   Pulse 99   Temp 98 F (36.7 C) (Oral)   Ht 5' 5"  (1.651 m)   Wt (!) 329 lb 3.2 oz (149.3 kg)   SpO2 97%   BMI 54.78 kg/m   Wt Readings from Last 3 Encounters:  04/14/19 (!) 329 lb 3.2 oz (149.3 kg)  04/08/19 300 lb (136.1 kg)   01/14/19 (!) 330 lb (149.7 kg)    Physical Exam Vitals and nursing note reviewed.  Constitutional:      General: She is not in acute distress.    Appearance: Normal appearance. She is normal weight. She is not toxic-appearing.  Cardiovascular:     Rate and Rhythm: Normal rate and regular rhythm.     Heart sounds: No murmur.  Pulmonary:     Effort: Pulmonary effort is normal. No respiratory distress.     Breath sounds: Normal breath sounds. No wheezing or rhonchi.  Musculoskeletal:        General: Tenderness present. No swelling. Normal range of motion.     Right lower leg: No edema.     Left lower leg: No edema.  Skin:    General: Skin is warm and dry.     Coloration: Skin is not jaundiced or pale.     Findings: No erythema or rash.  Neurological:     General: No focal deficit present.     Mental Status: She is alert and oriented to person, place,  and time. Mental status is at baseline.     Motor: No weakness.     Gait: Gait normal.  Psychiatric:        Mood and Affect: Mood normal.        Behavior: Behavior normal.        Thought Content: Thought content normal.        Judgment: Judgment normal.       Assessment & Plan:   Problem List Items Addressed This Visit      Musculoskeletal and Integument   Sprain of left knee - Primary    Acute, ongoing.  Continue rest, ice, compress, and elevate.  Encouraged to ice nightly.  Can continue Tylenol for pain as needed.  Monitor popping sound, if not better or worse in 2 weeks, may consider orthopedic referral.  Patient advised to go to ED with any sudden onset pain or discomfort in knee.        Other   Anxiety and depression    PHQ-9 increased and with increased stress and trouble sleeping, will start Trazodone 63m nightly.  Advised to call or return to office with concerns.  Will follow up on mood in 4 weeks.       Relevant Medications   traZODone (DESYREL) 50 MG tablet       Follow up plan: Return in about 4 weeks  (around 05/12/2019) for mood f/u, can be virtual.

## 2019-04-14 NOTE — Assessment & Plan Note (Signed)
Acute, ongoing.  Continue rest, ice, compress, and elevate.  Encouraged to ice nightly.  Can continue Tylenol for pain as needed.  Monitor popping sound, if not better or worse in 2 weeks, may consider orthopedic referral.  Patient advised to go to ED with any sudden onset pain or discomfort in knee.

## 2019-04-14 NOTE — Assessment & Plan Note (Signed)
PHQ-9 increased and with increased stress and trouble sleeping, will start Trazodone 50mg  nightly.  Advised to call or return to office with concerns.  Will follow up on mood in 4 weeks.

## 2019-04-27 ENCOUNTER — Encounter: Payer: Self-pay | Admitting: Nurse Practitioner

## 2019-04-28 NOTE — Telephone Encounter (Signed)
Needs appointment

## 2019-05-05 ENCOUNTER — Ambulatory Visit: Payer: BC Managed Care – PPO | Admitting: Nurse Practitioner

## 2019-05-06 ENCOUNTER — Encounter: Payer: Self-pay | Admitting: Nurse Practitioner

## 2019-05-06 ENCOUNTER — Ambulatory Visit (INDEPENDENT_AMBULATORY_CARE_PROVIDER_SITE_OTHER): Payer: BC Managed Care – PPO | Admitting: Nurse Practitioner

## 2019-05-06 ENCOUNTER — Other Ambulatory Visit: Payer: Self-pay

## 2019-05-06 VITALS — BP 139/76 | HR 81 | Temp 98.0°F | Ht 65.0 in | Wt 332.0 lb

## 2019-05-06 DIAGNOSIS — S8392XD Sprain of unspecified site of left knee, subsequent encounter: Secondary | ICD-10-CM

## 2019-05-06 NOTE — Assessment & Plan Note (Signed)
Acute, improving, but still not at 100%.  Continue rest, ice, compress, and elevate.  Can continue Tylenol prn pain.  Will refer to physical therapy @ [facility] due to cost with insurance.  Knee exercises given today.  Advised to get knee brace/support with direction from PT.  With sudden onset acute pain or discomfort in knee, go to ED.

## 2019-05-06 NOTE — Progress Notes (Signed)
BP 139/76 (BP Location: Left Arm, Patient Position: Sitting, Cuff Size: Normal)   Pulse 81   Temp 98 F (36.7 C) (Oral)   Ht 5' 5" (1.651 m)   Wt (!) 332 lb (150.6 kg)   SpO2 98%   BMI 55.25 kg/m    Subjective:    Patient ID: Diana Sexton, female    DOB: May 14, 1985, 34 y.o.   MRN: [id number]  HPI: Diana Sexton is a 34 y.o. female presenting for knee pain follow up.  Chief Complaint  Patient presents with  . Knee Pain    Left knee. Not constant. Painful when walking long distances.   KNEE PAIN Duration: months Involved knee: left Mechanism of injury: fall Location:anterior Onset: gradual Severity: mild  Quality:  throbbing Frequency: intermittent Radiation: no Aggravating factors: walking, running, stairs and movement  Alleviating factors: ice and crutches  Status: better , but not at 100%, trying to increase physical activity Treatments attempted: rest, ice and APAP  Relief with NSAIDs?:  mild Weakness with weight bearing or walking: no Sensation of giving way: no Locking: no Popping: no Bruising: no Swelling: no Redness: no Paresthesias/decreased sensation: no Fevers: no  STRESS/MOOD Started Trazodone 9m qhs at last visit.  She reports she started taking it and it would make her go to sleep very quickly.  She reports she took it for about 1 week and then stopped taking it because it made her fall asleep too quickly and wake up too early.  She reports her stress/mood is much better after "catching up" on that week of good sleep with the Trazodone.  Mood status: better Satisfied with current treatment?: yes Symptom severity: mild  Duration of current treatment : weeks Side effects: no Medication compliance: excellent compliance Psychotherapy/counseling: no  Previous psychiatric medications: Trazodone Depressed mood: no Anxious mood: no Anhedonia: no Significant weight loss or gain: no Insomnia: no  Fatigue: no Feelings of worthlessness or  guilt: no Impaired concentration/indecisiveness: no Suicidal ideations: no Hopelessness: no Crying spells: no [facility] 04/14/2019 01/28/2015 12/11/2014  Decreased Interest 1 1 0  Down, Depressed, Hopeless 1 0 1  PHQ - 2 Score _0 Altered sleeping 3 - -  Tired, decreased energy 1 - -  Change in appetite 2 - -  Feeling bad or failure about yourself  1 - -  Trouble concentrating 1 - -  Moving slowly or fidgety/restless 0 - -  Suicidal thoughts 0 - -  PHQ-9 Score 10 - -  Difficult doing work/chores Somewhat difficult - -   Allergies  Allergen Reactions  . Penicillins Rash    Has patient had a PCN reaction causing immediate rash, facial/tongue/throat swelling, SOB or lightheadedness with hypotension: Yes Has patient had a PCN reaction causing severe rash involving mucus membranes or skin necrosis: Yes Has patient had a PCN reaction that required hospitalization: No Has patient had a PCN reaction occurring within the last 10 years: Yes If all of the above answers are "NO", then may proceed with Cephalosporin use.    Outpatient Encounter Medications as of 05/06/2019  Medication Sig Note  . albuterol (VENTOLIN HFA) 108 (90 Base) MCG/ACT inhaler Inhale 2 puffs into the lungs every 6 (six) hours as needed for wheezing or shortness of breath. 04/14/2019: PRN only  . ibuprofen (ADVIL) 600 MG tablet Take 1 tablet (600 mg total) by mouth every 8 (eight) hours as needed. 04/14/2019: PRN only  . traZODone (DESYREL) 50 MG tablet Take 0.5-1  tablets (25-50 mg total) by mouth at bedtime as needed for sleep (and anxiety).    No facility-administered encounter medications on file as of 05/06/2019.   Patient Active Problem List   Diagnosis Date Noted  . Sprain of left knee 04/14/2019  . DVT (deep venous thrombosis) ([place]) 02/09/2018  . Menorrhagia with irregular cycle 10/13/2016  . Other irritable bowel syndrome 05/02/2016  . Urinary tract infection 01/21/2015  . Anxiety and  depression 01/21/2015  . Nephrolithiasis 01/15/2015  . Abnormal radiologic findings on diagnostic imaging of renal pelvis, ureter, or bladder 01/15/2015  . Low HDL (under 40) 01/01/2015  . Elevated LDL cholesterol level 01/01/2015  . Insomnia 12/16/2014  . Morbid obesity ([place]) 12/11/2014  . Hematuria 12/11/2014  . Preventative health care 12/11/2014  . Low back pain 10/13/2014  . Prothrombin gene mutation ([place]) 10/13/2014  . Hydronephrosis 06/07/2012  . Incomplete emptying of bladder 06/07/2012  . Renal colic 18/84/1660  . Ureteric stone 06/07/2012   Past Medical History:  Diagnosis Date  . Anemia   . Anxiety   . Cellulitis 2012   caused stillbirth  . DVT (deep venous thrombosis) ([place]) 06/2011   under right clavicle  . Dysmenorrhea   . Dyspnea   . Family history of breast cancer 11/2013   BRCA/MyRisk neg  . H/O blood clots   . Headache    related to hormonal meds  . History of kidney stones   . History of prothrombin mutation   . Increased risk of breast cancer 11/2013   IBIS=27%  . Obesity    Relevant past medical, surgical, family and social history reviewed and updated as indicated. Interim medical history since our last visit reviewed.  Review of Systems  Constitutional: Negative.  Negative for activity change, appetite change and fever.  Musculoskeletal: Positive for arthralgias. Negative for back pain, gait problem, joint swelling, neck pain and neck stiffness.  Skin: Negative.  Negative for color change, pallor and rash.  Neurological: Negative.  Negative for dizziness, weakness, light-headedness and headaches.  Psychiatric/Behavioral: Negative.  Negative for confusion, decreased concentration, sleep disturbance and suicidal ideas. The patient is not nervous/anxious.     Per HPI unless specifically indicated above     Objective:    BP 139/76 (BP Location: Left Arm, Patient Position: Sitting, Cuff Size: Normal)   Pulse 81   Temp 98 F (36.7 C) (Oral)   Ht  5' 5" (1.651 m)   Wt (!) 332 lb (150.6 kg)   SpO2 98%   BMI 55.25 kg/m   Wt Readings from Last 3 Encounters:  05/06/19 (!) 332 lb (150.6 kg)  04/14/19 (!) 329 lb 3.2 oz (149.3 kg)  04/08/19 300 lb (136.1 kg)    Physical Exam Vitals and nursing note reviewed.  Constitutional:      General: She is not in acute distress.    Appearance: Normal appearance. She is not toxic-appearing.  Musculoskeletal:        General: No swelling, tenderness or deformity. Normal range of motion.     Right lower leg: No edema.     Left lower leg: No edema.  Skin:    General: Skin is warm and dry.     Coloration: Skin is not jaundiced or pale.     Findings: No bruising or erythema.  Neurological:     General: No focal deficit present.     Mental Status: She is alert and oriented to person, place, and time. Mental status is at baseline.  Motor: No weakness.     Gait: Gait normal.  Psychiatric:        Mood and Affect: Mood normal.        Behavior: Behavior normal.        Thought Content: Thought content normal.        Judgment: Judgment normal.       Assessment & Plan:   Problem List Items Addressed This Visit      Musculoskeletal and Integument   Sprain of left knee - Primary    Acute, improving, but still not at 100%.  Continue rest, ice, compress, and elevate.  Can continue Tylenol prn pain.  Will refer to physical therapy @ [facility] due to cost with insurance.  Knee exercises given today.  Advised to get knee brace/support with direction from PT.  With sudden onset acute pain or discomfort in knee, go to ED.      Relevant Orders   Ambulatory referral to Physical Therapy       Follow up plan: Return if symptoms worsen or fail to improve.

## 2019-05-06 NOTE — Patient Instructions (Signed)
Journal for Nurse Practitioners, 15(4), 263-267. Retrieved November 19, 2017 from [email or web address].com/nursing">  Knee Exercises Ask your health care provider which exercises are safe for you. Do exercises exactly as told by your health care provider and adjust them as directed. It is normal to feel mild stretching, pulling, tightness, or discomfort as you do these exercises. Stop right away if you feel sudden pain or your pain gets worse. Do not begin these exercises until told by your health care provider. Stretching and range-of-motion exercises These exercises warm up your muscles and joints and improve the movement and flexibility of your knee. These exercises also help to relieve pain and swelling. Knee extension, prone 1. Lie on your abdomen (prone position) on a bed. 2. Place your left / right knee just beyond the edge of the surface so your knee is not on the bed. You can put a towel under your left / right thigh just above your kneecap for comfort. 3. Relax your leg muscles and allow gravity to straighten your knee (extension). You should feel a stretch behind your left / right knee. 4. Hold this position for __________ seconds. 5. Scoot up so your knee is supported between repetitions. Repeat __________ times. Complete this exercise __________ times a day. Knee flexion, active  1. Lie on your back with both legs straight. If this causes back discomfort, bend your left / right knee so your foot is flat on the floor. 2. Slowly slide your left / right heel back toward your buttocks. Stop when you feel a gentle stretch in the front of your knee or thigh (flexion). 3. Hold this position for __________ seconds. 4. Slowly slide your left / right heel back to the starting position. Repeat __________ times. Complete this exercise __________ times a day. Quadriceps stretch, prone  1. Lie on your abdomen on a firm surface, such as a bed or padded floor. 2. Bend your left / right knee and hold  your ankle. If you cannot reach your ankle or pant leg, loop a belt around your foot and grab the belt instead. 3. Gently pull your heel toward your buttocks. Your knee should not slide out to the side. You should feel a stretch in the front of your thigh and knee (quadriceps). 4. Hold this position for __________ seconds. Repeat __________ times. Complete this exercise __________ times a day. Hamstring, supine 1. Lie on your back (supine position). 2. Loop a belt or towel over the ball of your left / right foot. The ball of your foot is on the walking surface, right under your toes. 3. Straighten your left / right knee and slowly pull on the belt to raise your leg until you feel a gentle stretch behind your knee (hamstring). ? Do not let your knee bend while you do this. ? Keep your other leg flat on the floor. 4. Hold this position for __________ seconds. Repeat __________ times. Complete this exercise __________ times a day. Strengthening exercises These exercises build strength and endurance in your knee. Endurance is the ability to use your muscles for a long time, even after they get tired. Quadriceps, isometric This exercise stretches the muscles in front of your thigh (quadriceps) without moving your knee joint (isometric). 1. Lie on your back with your left / right leg extended and your other knee bent. Put a rolled towel or small pillow under your knee if told by your health care provider. 2. Slowly tense the muscles in the front of your left /   right thigh. You should see your kneecap slide up toward your hip or see increased dimpling just above the knee. This motion will push the back of the knee toward the floor. 3. For __________ seconds, hold the muscle as tight as you can without increasing your pain. 4. Relax the muscles slowly and completely. Repeat __________ times. Complete this exercise __________ times a day. Straight leg raises This exercise stretches the muscles in front  of your thigh (quadriceps) and the muscles that move your hips (hip flexors). 1. Lie on your back with your left / right leg extended and your other knee bent. 2. Tense the muscles in the front of your left / right thigh. You should see your kneecap slide up or see increased dimpling just above the knee. Your thigh may even shake a bit. 3. Keep these muscles tight as you raise your leg 4-6 inches (10-15 cm) off the floor. Do not let your knee bend. 4. Hold this position for __________ seconds. 5. Keep these muscles tense as you lower your leg. 6. Relax your muscles slowly and completely after each repetition. Repeat __________ times. Complete this exercise __________ times a day. Hamstring, isometric 1. Lie on your back on a firm surface. 2. Bend your left / right knee about __________ degrees. 3. Dig your left / right heel into the surface as if you are trying to pull it toward your buttocks. Tighten the muscles in the back of your thighs (hamstring) to "dig" as hard as you can without increasing any pain. 4. Hold this position for __________ seconds. 5. Release the tension gradually and allow your muscles to relax completely for __________ seconds after each repetition. Repeat __________ times. Complete this exercise __________ times a day. Hamstring curls If told by your health care provider, do this exercise while wearing ankle weights. Begin with __________ lb weights. Then increase the weight by 1 lb (0.5 kg) increments. Do not wear ankle weights that are more than __________ lb. 1. Lie on your abdomen with your legs straight. 2. Bend your left / right knee as far as you can without feeling pain. Keep your hips flat against the floor. 3. Hold this position for __________ seconds. 4. Slowly lower your leg to the starting position. Repeat __________ times. Complete this exercise __________ times a day. Squats This exercise strengthens the muscles in front of your thigh and knee  (quadriceps). 1. Stand in front of a table, with your feet and knees pointing straight ahead. You may rest your hands on the table for balance but not for support. 2. Slowly bend your knees and lower your hips like you are going to sit in a chair. ? Keep your weight over your heels, not over your toes. ? Keep your lower legs upright so they are parallel with the table legs. ? Do not let your hips go lower than your knees. ? Do not bend lower than told by your health care provider. ? If your knee pain increases, do not bend as low. 3. Hold the squat position for __________ seconds. 4. Slowly push with your legs to return to standing. Do not use your hands to pull yourself to standing. Repeat __________ times. Complete this exercise __________ times a day. Wall slides This exercise strengthens the muscles in front of your thigh and knee (quadriceps). 1. Lean your back against a smooth wall or door, and walk your feet out 18-24 inches (46-61 cm) from it. 2. Place your feet hip-width apart. 3.   Slowly slide down the wall or door until your knees bend __________ degrees. Keep your knees over your heels, not over your toes. Keep your knees in line with your hips. 4. Hold this position for __________ seconds. Repeat __________ times. Complete this exercise __________ times a day. Straight leg raises This exercise strengthens the muscles that rotate the leg at the hip and move it away from your body (hip abductors). 1. Lie on your side with your left / right leg in the top position. Lie so your head, shoulder, knee, and hip line up. You may bend your bottom knee to help you keep your balance. 2. Roll your hips slightly forward so your hips are stacked directly over each other and your left / right knee is facing forward. 3. Leading with your heel, lift your top leg 4-6 inches (10-15 cm). You should feel the muscles in your outer hip lifting. ? Do not let your foot drift forward. ? Do not let your knee  roll toward the ceiling. 4. Hold this position for __________ seconds. 5. Slowly return your leg to the starting position. 6. Let your muscles relax completely after each repetition. Repeat __________ times. Complete this exercise __________ times a day. Straight leg raises This exercise stretches the muscles that move your hips away from the front of the pelvis (hip extensors). 1. Lie on your abdomen on a firm surface. You can put a pillow under your hips if that is more comfortable. 2. Tense the muscles in your buttocks and lift your left / right leg about 4-6 inches (10-15 cm). Keep your knee straight as you lift your leg. 3. Hold this position for __________ seconds. 4. Slowly lower your leg to the starting position. 5. Let your leg relax completely after each repetition. Repeat __________ times. Complete this exercise __________ times a day. This information is not intended to replace advice given to you by your health care provider. Make sure you discuss any questions you have with your health care provider. Document Revised: 11/20/2017 Document Reviewed: 11/20/2017 [employer] Patient Education  2020 [employer]  

## 2019-05-30 ENCOUNTER — Encounter: Payer: Self-pay | Admitting: Nurse Practitioner

## 2019-06-23 ENCOUNTER — Emergency Department
Admission: EM | Admit: 2019-06-23 | Discharge: 2019-06-23 | Disposition: A | Discharge status: Home / Self Care | Payer: BC Managed Care – PPO | Attending: Emergency Medicine | Admitting: Emergency Medicine

## 2019-06-23 ENCOUNTER — Other Ambulatory Visit: Payer: Self-pay

## 2019-06-23 ENCOUNTER — Emergency Department: Payer: BC Managed Care – PPO

## 2019-06-23 ENCOUNTER — Encounter: Payer: Self-pay | Admitting: Emergency Medicine

## 2019-06-23 DIAGNOSIS — N202 Calculus of kidney with calculus of ureter: Secondary | ICD-10-CM | POA: Diagnosis not present

## 2019-06-23 DIAGNOSIS — R109 Unspecified abdominal pain: Secondary | ICD-10-CM | POA: Diagnosis not present

## 2019-06-23 DIAGNOSIS — N200 Calculus of kidney: Secondary | ICD-10-CM | POA: Insufficient documentation

## 2019-06-23 LAB — URINALYSIS, COMPLETE (UACMP) WITH MICROSCOPIC
Bacteria, UA: NONE SEEN
Bilirubin Urine: NEGATIVE
Glucose, UA: NEGATIVE mg/dL
Ketones, ur: NEGATIVE mg/dL
Leukocytes,Ua: NEGATIVE
Nitrite: NEGATIVE
Protein, ur: 100 mg/dL — AB
RBC / HPF: >50 RBC/hpf — ABNORMAL HIGH (ref 0–5)
Specific Gravity, Urine: 1.027 (ref 1.005–1.030)
WBC, UA: NONE SEEN WBC/hpf (ref 0–5)
pH: 5 (ref 5.0–8.0)

## 2019-06-23 LAB — POC URINE PREG, ED: Preg Test, Ur: NEGATIVE

## 2019-06-23 LAB — COMPREHENSIVE METABOLIC PANEL
ALT: 21 U/L (ref 0–44)
AST: 20 U/L (ref 15–41)
Albumin: 3.5 g/dL (ref 3.5–5.0)
Alkaline Phosphatase: 70 U/L (ref 38–126)
Anion gap: 10 (ref 5–15)
BUN: 13 mg/dL (ref 6–20)
CO2: 25 mmol/L (ref 22–32)
Calcium: 9.4 mg/dL (ref 8.9–10.3)
Chloride: 104 mmol/L (ref 98–111)
Creatinine, Ser: 0.75 mg/dL (ref 0.44–1.00)
GFR calc Af Amer: >60 mL/min (ref >60)
GFR calc non Af Amer: >60 mL/min (ref >60)
Glucose, Bld: 110 mg/dL — ABNORMAL HIGH (ref 70–99)
Potassium: 3.7 mmol/L (ref 3.5–5.1)
Sodium: 139 mmol/L (ref 135–145)
Total Bilirubin: 0.5 mg/dL (ref 0.3–1.2)
Total Protein: 7.6 g/dL (ref 6.5–8.1)

## 2019-06-23 LAB — CBC
HCT: 37.6 % (ref 36.0–46.0)
Hemoglobin: 12.3 g/dL (ref 12.0–15.0)
MCH: 23.9 pg — ABNORMAL LOW (ref 26.0–34.0)
MCHC: 32.7 g/dL (ref 30.0–36.0)
MCV: 73 fL — ABNORMAL LOW (ref 80.0–100.0)
Platelets: 447 10*3/uL — ABNORMAL HIGH (ref 150–400)
RBC: 5.15 MIL/uL — ABNORMAL HIGH (ref 3.87–5.11)
RDW: 15.9 % — ABNORMAL HIGH (ref 11.5–15.5)
WBC: 10.6 10*3/uL — ABNORMAL HIGH (ref 4.0–10.5)
nRBC: 0 % (ref 0.0–0.2)

## 2019-06-23 LAB — LIPASE, BLOOD: Lipase: 22 U/L (ref 11–51)

## 2019-06-23 MED ORDER — SODIUM CHLORIDE 0.9% FLUSH: 3.0000 mL | Freq: Once | INTRAVENOUS | Status: DC

## 2019-06-23 MED ORDER — ONDANSETRON HCL 4 MG/2ML IJ SOLN
4.0000 mg | Freq: Once | INTRAMUSCULAR | Status: AC
  Administered 2019-06-23: 4 mg via INTRAVENOUS
  Filled 2019-06-23: qty 2

## 2019-06-23 MED ORDER — OXYCODONE-ACETAMINOPHEN 5-325 MG PO TABS
2.0000 | ORAL_TABLET | Freq: Once | ORAL | Status: AC
  Administered 2019-06-23: 2 via ORAL
  Filled 2019-06-23: qty 2

## 2019-06-23 MED ORDER — OXYCODONE-ACETAMINOPHEN 5-325 MG PO TABS: 1.0000 | ORAL_TABLET | ORAL | 0 refills | Status: DC | PRN

## 2019-06-23 MED ORDER — HYDROMORPHONE HCL 1 MG/ML IJ SOLN
1.0000 mg | Freq: Once | INTRAMUSCULAR | Status: AC
  Administered 2019-06-23: 1 mg via INTRAVENOUS
  Filled 2019-06-23: qty 1

## 2019-06-23 MED ORDER — ONDANSETRON 4 MG PO TBDP
4.0000 mg | ORAL_TABLET | Freq: Once | ORAL | Status: AC | PRN
  Administered 2019-06-23: 4 mg via ORAL
  Filled 2019-06-23 (×2): qty 1

## 2019-06-23 MED ORDER — TAMSULOSIN HCL 0.4 MG PO CAPS
0.4000 mg | ORAL_CAPSULE | Freq: Every day | ORAL | 0 refills | Status: DC
Start: 2019-06-23 — End: 2019-12-22

## 2019-06-23 MED ORDER — ONDANSETRON HCL 4 MG PO TABS: 4.0000 mg | ORAL_TABLET | Freq: Every day | ORAL | 0 refills | Status: DC | PRN

## 2019-06-23 MED ORDER — KETOROLAC TROMETHAMINE 30 MG/ML IJ SOLN
30.0000 mg | Freq: Once | INTRAMUSCULAR | Status: AC
  Administered 2019-06-23: 30 mg via INTRAVENOUS
  Filled 2019-06-23: qty 1

## 2019-06-23 MED ORDER — SODIUM CHLORIDE 0.9 % IV BOLUS
1000.0000 mL | Freq: Once | INTRAVENOUS | Status: AC
  Administered 2019-06-23: 1000 mL via INTRAVENOUS

## 2019-06-23 NOTE — ED Notes (Signed)
Pt states coming in for right sided back and abdominal pain with nauseas.

## 2019-06-23 NOTE — ED Provider Notes (Addendum)
[facility] Emergency Department Provider Note  Time seen: 8:45 PM  I have reviewed the triage vital signs and the nursing notes.   HISTORY  Chief Complaint Abdominal Pain   HPI Diana Sexton is a 34 y.o. female with a past medical history of anemia, cellulitis, presents to the emergency department for acute onset of right-sided abdominal pain.  According to the patient around 7 PM tonight she developed acute onset of right flank pain starting in her back and now radiating around to her right lower abdomen.  States nausea but no vomiting or diarrhea.  No fever.  Patient states she had blood in her urine upon arrival to the emergency department.  No dysuria.  Does have a history of kidney stones but has been several years and this feels more intense than her past kidney stones.  Patient rates her pain 10/10 sharp pain in her right flank.   Past Medical History:  Diagnosis Date  . Anemia   . Anxiety   . Cellulitis 2012   caused stillbirth  . DVT (deep venous thrombosis) ([employer]) 06/2011   under right clavicle  . Dysmenorrhea   . Dyspnea   . Family history of breast cancer 11/2013   BRCA/MyRisk neg  . H/O blood clots   . Headache    related to hormonal meds  . History of kidney stones   . History of prothrombin mutation   . Increased risk of breast cancer 11/2013   IBIS=27%  . Obesity     Patient Active Problem List   Diagnosis Date Noted  . Sprain of left knee 04/14/2019  . DVT (deep venous thrombosis) ([place]) 02/09/2018  . Menorrhagia with irregular cycle 10/13/2016  . Other irritable bowel syndrome 05/02/2016  . Urinary tract infection 01/21/2015  . Anxiety and depression 01/21/2015  . Nephrolithiasis 01/15/2015  . Abnormal radiologic findings on diagnostic imaging of renal pelvis, ureter, or bladder 01/15/2015  . Low HDL (under 40) 01/01/2015  . Elevated LDL cholesterol level 01/01/2015  . Insomnia 12/16/2014  . Morbid obesity ([place]) 12/11/2014   . Hematuria 12/11/2014  . Preventative health care 12/11/2014  . Low back pain 10/13/2014  . Prothrombin gene mutation ([place]) 10/13/2014  . Hydronephrosis 06/07/2012  . Incomplete emptying of bladder 06/07/2012  . Renal colic 11/05/3005  . Ureteric stone 06/07/2012    Past Surgical History:  Procedure Laterality Date  . CHOLECYSTECTOMY    . CYSTOSCOPY W/ RETROGRADES Left 02/02/2017   Procedure: CYSTOSCOPY WITH RETROGRADE PYELOGRAM;  Surgeon: Abbie Sons, MD;  Location: [facility] ORS;  Service: Urology;  Laterality: Left;  . CYSTOSCOPY/URETEROSCOPY/HOLMIUM LASER/STENT PLACEMENT Left 02/02/2017   Procedure: CYSTOSCOPY/URETEROSCOPY/HOLMIUM LASER/STENT PLACEMENT;  Surgeon: Abbie Sons, MD;  Location: [facility] ORS;  Service: Urology;  Laterality: Left;  . DILITATION & CURRETTAGE/HYSTROSCOPY WITH ESSURE    . DILITATION & CURRETTAGE/HYSTROSCOPY WITH NOVASURE ABLATION N/A 12/24/2018   Procedure: DILATATION & CURETTAGE/HYSTEROSCOPY WITH ENDOMETRIAL ABLATION (MINERVA SYSTEM);  Surgeon: Will Bonnet, MD;  Location: [facility] ORS;  Service: Gynecology;  Laterality: N/A;  . INTRAUTERINE DEVICE (IUD) INSERTION    . LITHOTRIPSY    . TONSILLECTOMY    . TONSILLECTOMY AND ADENOIDECTOMY    . TUBAL LIGATION      Prior to Admission medications   Medication Sig Start Date End Date Taking? Authorizing Provider  albuterol (VENTOLIN HFA) 108 (90 Base) MCG/ACT inhaler Inhale 2 puffs into the lungs every 6 (six) hours as needed for wheezing or shortness of breath. 12/02/18  Lavonia Drafts, MD  ibuprofen (ADVIL) 600 MG tablet Take 1 tablet (600 mg total) by mouth every 8 (eight) hours as needed. 04/08/19   Johnn Hai, PA-C  traZODone (DESYREL) 50 MG tablet Take 0.5-1 tablets (25-50 mg total) by mouth at bedtime as needed for sleep (and anxiety). 04/14/19   Carnella Guadalajara I, NP    Allergies  Allergen Reactions  . Penicillins Rash    Has patient had a PCN reaction causing immediate rash,  facial/tongue/throat swelling, SOB or lightheadedness with hypotension: Yes Has patient had a PCN reaction causing severe rash involving mucus membranes or skin necrosis: Yes Has patient had a PCN reaction that required hospitalization: No Has patient had a PCN reaction occurring within the last 10 years: Yes If all of the above answers are "NO", then may proceed with Cephalosporin use.     Family History  Problem Relation Age of Onset  . Cancer Mother        breast  . Diabetes Father   . Hypertension Father   . Heart disease Maternal Grandfather   . Dementia Paternal Grandmother   . Bladder Cancer Neg Hx   . Kidney cancer Neg Hx     Social History Social History   Tobacco Use  . Smoking status: Never Smoker  . Smokeless tobacco: Never Used  Substance Use Topics  . Alcohol use: Yes    Comment: 1 drink per month  . Drug use: No    Review of Systems Constitutional: Negative for fever. Cardiovascular: Negative for chest pain. Respiratory: Negative for shortness of breath. Gastrointestinal: Positive for right flank pain.  Positive for nausea but negative for vomiting or diarrhea. Genitourinary: Negative for urinary compaints Musculoskeletal: Negative for musculoskeletal complaints Neurological: Negative for headache All other ROS negative  ____________________________________________   PHYSICAL EXAM:  VITAL SIGNS: ED Triage Vitals  Enc Vitals Group     BP 06/23/19 2013 (!) 145/67     Pulse Rate 06/23/19 2013 85     Resp 06/23/19 2013 20     Temp 06/23/19 2013 97.9 F (36.6 C)     Temp Source 06/23/19 2013 Oral     SpO2 06/23/19 2013 99 %     Weight 06/23/19 2014 300 lb (136.1 kg)     Height 06/23/19 2014 5' 5"  (1.651 m)     Head Circumference --      Peak Flow --      Pain Score 06/23/19 2017 9     Pain Loc --      Pain Edu? --      Excl. in [place]? --     Constitutional: Alert and oriented.  Moderate distress due to right flank pain. Eyes: Normal  exam ENT      Head: Normocephalic and atraumatic.      Mouth/Throat: Mucous membranes are moist. Cardiovascular: Normal rate, regular rhythm. Respiratory: Normal respiratory effort without tachypnea nor retractions. Breath sounds are clear  Gastrointestinal: Soft and nontender. No distention.  Musculoskeletal: Nontender with normal range of motion in all extremities.  Neurologic:  Normal speech and language. No gross focal neurologic deficits Skin:  Skin is warm, dry and intact.  Psychiatric: Mood and affect are normal.   ____________________________________________   RADIOLOGY  CT scan shows 67 mm right mid to distal ureteral stone.  ____________________________________________   INITIAL IMPRESSION / ASSESSMENT AND PLAN / ED COURSE  Pertinent labs & imaging results that were available during my care of the patient were reviewed by me and  considered in my medical decision making (see chart for details).   Patient presents to the emergency department significant right flank pain starting around 7 PM tonight.  Differential would include ureterolithiasis, UTI, pyelonephritis.  Patient is status post cholecystectomy.  History of 2 kidney stones previously.  No tenderness on exam.  We will proceed with CT imaging treat pain and nausea, IV hydrate while awaiting results.  Lab work shows hematuria otherwise no concerning findings.  CT shows mid to distal right ureteral stone, 6 to 7 mm.  Patient states some pain improvement after Toradol.  We will dose narcotic pain medication and reassess.  Given her otherwise reassuring work-up I believe the patient would be safe for discharge home with close urology follow-up.  If the patient's pain is not able to be controlled we will admit.   Patient states her pain is minimal.  We will discharge patient home with urology follow-up.  Discussed my typical kidney stone return precautions.  Diana Sexton was evaluated in Emergency Department on  06/23/2019 for the symptoms described in the history of present illness. She was evaluated in the context of the global COVID-19 pandemic, which necessitated consideration that the patient might be at risk for infection with the SARS-CoV-2 virus that causes COVID-19. Institutional protocols and algorithms that pertain to the evaluation of patients at risk for COVID-19 are in a state of rapid change based on information released by regulatory bodies including the CDC and federal and state organizations. These policies and algorithms were followed during the patient's care in the ED.  ____________________________________________   FINAL CLINICAL IMPRESSION(S) / ED DIAGNOSES  Kidney stone   Harvest Dark, MD 06/23/19 2252    Harvest Dark, MD 06/23/19 2342

## 2019-06-23 NOTE — Discharge Instructions (Signed)
Please call the number provided for urology tomorrow morning to arrange a follow-up appointment as soon as possible.  Return to the emergency department for any significant increase in pain, development of fever or painful urination, or any other symptom personally concerning to yourself.

## 2019-06-23 NOTE — ED Triage Notes (Signed)
Pt to ED from home c/o right upper and lower abd pain radiating to back that started suddenly around 1900 tonight.  States nausea without vomiting or diarrhea.  Has urinated since and states was normal.  Pt dry heaving in triage.  States hx of kidney stones but this feels different.

## 2019-06-24 ENCOUNTER — Encounter: Payer: Self-pay | Admitting: Oncology

## 2019-06-24 ENCOUNTER — Ambulatory Visit
Admission: RE | Admit: 2019-06-24 | Discharge: 2019-06-24 | Disposition: A | Discharge status: Home / Self Care | Payer: BC Managed Care – PPO | Source: Ambulatory Visit | Attending: Urology | Admitting: Urology

## 2019-06-24 ENCOUNTER — Other Ambulatory Visit: Payer: Self-pay | Admitting: Radiology

## 2019-06-24 ENCOUNTER — Ambulatory Visit: Payer: BC Managed Care – PPO | Admitting: Urology

## 2019-06-24 ENCOUNTER — Other Ambulatory Visit
Admission: RE | Admit: 2019-06-24 | Discharge: 2019-06-24 | Disposition: A | Discharge status: Home / Self Care | Payer: BC Managed Care – PPO | Source: Ambulatory Visit | Attending: Urology | Admitting: Urology

## 2019-06-24 ENCOUNTER — Encounter: Payer: Self-pay | Admitting: Urology

## 2019-06-24 VITALS — BP 131/80 | HR 71 | Ht 65.0 in | Wt 300.0 lb

## 2019-06-24 DIAGNOSIS — N200 Calculus of kidney: Secondary | ICD-10-CM | POA: Diagnosis not present

## 2019-06-24 DIAGNOSIS — Z01812 Encounter for preprocedural laboratory examination: Secondary | ICD-10-CM | POA: Diagnosis not present

## 2019-06-24 DIAGNOSIS — Z20822 Contact with and (suspected) exposure to covid-19: Secondary | ICD-10-CM | POA: Diagnosis not present

## 2019-06-24 LAB — URINE CULTURE: Culture: <10000 — AB

## 2019-06-24 NOTE — Progress Notes (Signed)
06/24/2019 1:55 PM   Diana Sexton 1985/07/07 [id number]  Referring provider: Helayne Seminole, NP [address] [place],  [place] [address]  Chief Complaint  Patient presents with  . Nephrolithiasis    HPI: Diana Sexton is a 34 y.o. female with a history recurrent stone disease who presented to the ED on 06/23/2019 with right renal colic.  -Acute onset right flank pain radiating to right lower quadrant yesterday -Severity rated 10/10 -Positive nausea without vomiting -No fever or chills -No bothersome lower urinary tract symptoms -CT with 7 mm calculus junction right mid-distal ureter with moderate to severe hydronephrosis/hydroureter -Pain controlled with parenteral narcotic and ketorolac -Discharged on tamsulosin, oxycodone and Zofran -Since ED visit mild discomfort which has been controlled with oral analgesics -Ureteroscopic stone removal times 04/04/2013 and 2018 -Stone analysis 2018 90% calcium oxalate monohydrate   PMH: Past Medical History:  Diagnosis Date  . Anemia   . Anxiety   . Cellulitis 2012   caused stillbirth  . DVT (deep venous thrombosis) ([place]) 06/2011   under right clavicle  . Dysmenorrhea   . Dyspnea   . Family history of breast cancer 11/2013   BRCA/MyRisk neg  . H/O blood clots   . Headache    related to hormonal meds  . History of kidney stones   . History of prothrombin mutation   . Increased risk of breast cancer 11/2013   IBIS=27%  . Obesity     Surgical History: Past Surgical History:  Procedure Laterality Date  . CHOLECYSTECTOMY    . CYSTOSCOPY W/ RETROGRADES Left 02/02/2017   Procedure: CYSTOSCOPY WITH RETROGRADE PYELOGRAM;  Surgeon: Abbie Sons, MD;  Location: [facility] ORS;  Service: Urology;  Laterality: Left;  . CYSTOSCOPY/URETEROSCOPY/HOLMIUM LASER/STENT PLACEMENT Left 02/02/2017   Procedure: CYSTOSCOPY/URETEROSCOPY/HOLMIUM LASER/STENT PLACEMENT;  Surgeon: Abbie Sons, MD;  Location: [facility] ORS;  Service: Urology;   Laterality: Left;  . DILITATION & CURRETTAGE/HYSTROSCOPY WITH ESSURE    . DILITATION & CURRETTAGE/HYSTROSCOPY WITH NOVASURE ABLATION N/A 12/24/2018   Procedure: DILATATION & CURETTAGE/HYSTEROSCOPY WITH ENDOMETRIAL ABLATION (MINERVA SYSTEM);  Surgeon: Will Bonnet, MD;  Location: [facility] ORS;  Service: Gynecology;  Laterality: N/A;  . INTRAUTERINE DEVICE (IUD) INSERTION    . LITHOTRIPSY    . TONSILLECTOMY    . TONSILLECTOMY AND ADENOIDECTOMY    . TUBAL LIGATION      Home Medications:  Allergies as of 06/24/2019      Reactions   Penicillins Rash   Has patient had a PCN reaction causing immediate rash, facial/tongue/throat swelling, SOB or lightheadedness with hypotension: Yes Has patient had a PCN reaction causing severe rash involving mucus membranes or skin necrosis: Yes Has patient had a PCN reaction that required hospitalization: No Has patient had a PCN reaction occurring within the last 10 years: Yes If all of the above answers are "NO", then may proceed with Cephalosporin use.      Medication List       Accurate as of Jun 24, 2019  1:55 PM. If you have any questions, ask your nurse or doctor.        albuterol 108 (90 Base) MCG/ACT inhaler Commonly known as: VENTOLIN HFA Inhale 2 puffs into the lungs every 6 (six) hours as needed for wheezing or shortness of breath.   ibuprofen 600 MG tablet Commonly known as: ADVIL Take 1 tablet (600 mg total) by mouth every 8 (eight) hours as needed.   ondansetron 4 MG tablet Commonly known as: Zofran Take 1 tablet (  4 mg total) by mouth daily as needed for nausea or vomiting.   oxyCODONE-acetaminophen 5-325 MG tablet Commonly known as: Percocet Take 1 tablet by mouth every 4 (four) hours as needed for severe pain.   tamsulosin 0.4 MG Caps capsule Commonly known as: FLOMAX Take 1 capsule (0.4 mg total) by mouth daily.   traZODone 50 MG tablet Commonly known as: DESYREL Take 0.5-1 tablets (25-50 mg total) by mouth at bedtime  as needed for sleep (and anxiety).       Allergies:  Allergies  Allergen Reactions  . Penicillins Rash    Has patient had a PCN reaction causing immediate rash, facial/tongue/throat swelling, SOB or lightheadedness with hypotension: Yes Has patient had a PCN reaction causing severe rash involving mucus membranes or skin necrosis: Yes Has patient had a PCN reaction that required hospitalization: No Has patient had a PCN reaction occurring within the last 10 years: Yes If all of the above answers are "NO", then may proceed with Cephalosporin use.     Family History: Family History  Problem Relation Age of Onset  . Cancer Mother        breast  . Diabetes Father   . Hypertension Father   . Heart disease Maternal Grandfather   . Dementia Paternal Grandmother   . Bladder Cancer Neg Hx   . Kidney cancer Neg Hx     Social History:  reports that she has never smoked. She has never used smokeless tobacco. She reports current alcohol use. She reports that she does not use drugs.   Physical Exam: BP 131/80   Pulse 71   Ht 5' 5" (1.651 m)   Wt 300 lb (136.1 kg)   BMI 49.92 kg/m   Constitutional:  Alert and oriented, No acute distress. HEENT: [place] AT, moist mucus membranes.  Trachea midline, no masses. Cardiovascular: No clubbing, cyanosis, or edema.  RRR Respiratory: Normal respiratory effort, no increased work of breathing.  Clear GI: Abdomen is soft, nontender, nondistended, no abdominal masses GU: No CVA tenderness Lymph: No cervical or inguinal lymphadenopathy. Skin: No rashes, bruises or suspicious lesions. Neurologic: Grossly intact, no focal deficits, moving all 4 extremities. Psychiatric: Normal mood and affect.   Pertinent Imaging: A KUB was obtained and the calculus is not definitely visualized on KUB.  Hounsfield units were ~600.  CT images were personally reviewed Results for orders placed during the hospital encounter of 06/23/19  CT Renal Stone Study    Narrative CLINICAL DATA:  Right-sided flank pain for several hours  EXAM: CT ABDOMEN AND PELVIS WITHOUT CONTRAST  TECHNIQUE: Multidetector CT imaging of the abdomen and pelvis was performed following the standard protocol without IV contrast.  COMPARISON:  01/27/2017  FINDINGS: Lower chest: No acute abnormality.  Hepatobiliary: No focal liver abnormality is seen. Status post cholecystectomy. No biliary dilatation.  Pancreas: Unremarkable. No pancreatic ductal dilatation or surrounding inflammatory changes.  Spleen: Normal in size without focal abnormality.  Adrenals/Urinary Tract: Adrenal glands are within normal limits. Left kidney demonstrates nonobstructing renal calculi. The largest of these lies in the lower pole and measures approximately 6 mm. The left ureter is unremarkable. The bladder is decompressed. The right kidney demonstrates focal enlargement as well as hydronephrosis secondary to a obstructing stone in the mid to distal ureter which measures approximately 6-7 mm. Nonobstructing stones are seen within the right kidney. The largest of these measures approximately 4 mm in greatest dimension.  Stomach/Bowel: The appendix is within normal limits. Colon shows no obstructive or inflammatory   changes. No small bowel or gastric abnormality is seen.  Vascular/Lymphatic: No significant vascular findings are present. No enlarged abdominal or pelvic lymph nodes.  Reproductive: Uterus and bilateral adnexa are unremarkable.  Other: No abdominal wall hernia or abnormality. No abdominopelvic ascites.  Musculoskeletal: No acute or significant osseous findings.  IMPRESSION: 6-7 mm right mid to distal ureteral stone with obstructive change.  Bilateral nonobstructing stones as described.  No other focal abnormality is seen.   Electronically Signed   By: Inez Catalina M.D.   On: 06/23/2019 21:10     Assessment & Plan:    - Right ureteral calculus  We discussed  various treatment options for urolithiasis including observation with or without medical expulsive therapy, shockwave lithotripsy (SWL), ureteroscopy and laser lithotripsy with stent placement.  We discussed that management is based on stone size, location, density, patient co-morbidities, and patient preference.   Stones <60m in size have a >80% spontaneous passage rate. Data surrounding the use of tamsulosin for medical expulsive therapy is controversial, but meta analyses suggests it is most efficacious for distal stones between 5-164min size.  She states she has not been able to pass stones previously.  SWL has a lower stone free rate in a single procedure, but also a lower complication rate compared to ureteroscopy and avoids a stent and associated stent related symptoms. Possible complications include renal hematoma, steinstrasse, and need for additional treatment.  Ureteroscopy with laser lithotripsy and stent placement has a higher stone free rate than SWL in a single procedure, however increased complication rate including possible infection, ureteral injury, bleeding, and stent related morbidity. Common stent related symptoms include dysuria, urgency/frequency, and flank pain.  After an extensive discussion of the risks and benefits of the above treatment options, the patient would like to proceed with ureteroscopic stone removal which will be scheduled this week.   ScAbbie SonsMDGrand Beach[address] Bishop DriveSuBarbertonuBergoo[place] [phone]

## 2019-06-24 NOTE — H&P (View-Only) (Signed)
06/24/2019 1:55 PM   Diana Sexton 1985/07/07 [id number]  Referring provider: Helayne Seminole, NP [address] [place],  [place] [address]  Chief Complaint  Patient presents with  . Nephrolithiasis    HPI: Diana Sexton is a 34 y.o. female with a history recurrent stone disease who presented to the ED on 06/23/2019 with right renal colic.  -Acute onset right flank pain radiating to right lower quadrant yesterday -Severity rated 10/10 -Positive nausea without vomiting -No fever or chills -No bothersome lower urinary tract symptoms -CT with 7 mm calculus junction right mid-distal ureter with moderate to severe hydronephrosis/hydroureter -Pain controlled with parenteral narcotic and ketorolac -Discharged on tamsulosin, oxycodone and Zofran -Since ED visit mild discomfort which has been controlled with oral analgesics -Ureteroscopic stone removal times 04/04/2013 and 2018 -Stone analysis 2018 90% calcium oxalate monohydrate   PMH: Past Medical History:  Diagnosis Date  . Anemia   . Anxiety   . Cellulitis 2012   caused stillbirth  . DVT (deep venous thrombosis) ([place]) 06/2011   under right clavicle  . Dysmenorrhea   . Dyspnea   . Family history of breast cancer 11/2013   BRCA/MyRisk neg  . H/O blood clots   . Headache    related to hormonal meds  . History of kidney stones   . History of prothrombin mutation   . Increased risk of breast cancer 11/2013   IBIS=27%  . Obesity     Surgical History: Past Surgical History:  Procedure Laterality Date  . CHOLECYSTECTOMY    . CYSTOSCOPY W/ RETROGRADES Left 02/02/2017   Procedure: CYSTOSCOPY WITH RETROGRADE PYELOGRAM;  Surgeon: Abbie Sons, MD;  Location: [facility];  Service: Urology;  Laterality: Left;  . CYSTOSCOPY/URETEROSCOPY/HOLMIUM LASER/STENT PLACEMENT Left 02/02/2017   Procedure: CYSTOSCOPY/URETEROSCOPY/HOLMIUM LASER/STENT PLACEMENT;  Surgeon: Abbie Sons, MD;  Location: [facility];  Service: Urology;   Laterality: Left;  . DILITATION & CURRETTAGE/HYSTROSCOPY WITH ESSURE    . DILITATION & CURRETTAGE/HYSTROSCOPY WITH NOVASURE ABLATION N/A 12/24/2018   Procedure: DILATATION & CURETTAGE/HYSTEROSCOPY WITH ENDOMETRIAL ABLATION (MINERVA SYSTEM);  Surgeon: Will Bonnet, MD;  Location: [facility];  Service: Gynecology;  Laterality: N/A;  . INTRAUTERINE DEVICE (IUD) INSERTION    . LITHOTRIPSY    . TONSILLECTOMY    . TONSILLECTOMY AND ADENOIDECTOMY    . TUBAL LIGATION      Home Medications:  Allergies as of 06/24/2019      Reactions   Penicillins Rash   Has patient had a PCN reaction causing immediate rash, facial/tongue/throat swelling, SOB or lightheadedness with hypotension: Yes Has patient had a PCN reaction causing severe rash involving mucus membranes or skin necrosis: Yes Has patient had a PCN reaction that required hospitalization: No Has patient had a PCN reaction occurring within the last 10 years: Yes If all of the above answers are "NO", then may proceed with Cephalosporin use.      Medication List       Accurate as of Jun 24, 2019  1:55 PM. If you have any questions, ask your nurse or doctor.        albuterol 108 (90 Base) MCG/ACT inhaler Commonly known as: VENTOLIN HFA Inhale 2 puffs into the lungs every 6 (six) hours as needed for wheezing or shortness of breath.   ibuprofen 600 MG tablet Commonly known as: ADVIL Take 1 tablet (600 mg total) by mouth every 8 (eight) hours as needed.   ondansetron 4 MG tablet Commonly known as: Zofran Take 1 tablet (  4 mg total) by mouth daily as needed for nausea or vomiting.   oxyCODONE-acetaminophen 5-325 MG tablet Commonly known as: Percocet Take 1 tablet by mouth every 4 (four) hours as needed for severe pain.   tamsulosin 0.4 MG Caps capsule Commonly known as: FLOMAX Take 1 capsule (0.4 mg total) by mouth daily.   traZODone 50 MG tablet Commonly known as: DESYREL Take 0.5-1 tablets (25-50 mg total) by mouth at bedtime  as needed for sleep (and anxiety).       Allergies:  Allergies  Allergen Reactions  . Penicillins Rash    Has patient had a PCN reaction causing immediate rash, facial/tongue/throat swelling, SOB or lightheadedness with hypotension: Yes Has patient had a PCN reaction causing severe rash involving mucus membranes or skin necrosis: Yes Has patient had a PCN reaction that required hospitalization: No Has patient had a PCN reaction occurring within the last 10 years: Yes If all of the above answers are "NO", then may proceed with Cephalosporin use.     Family History: Family History  Problem Relation Age of Onset  . Cancer Mother        breast  . Diabetes Father   . Hypertension Father   . Heart disease Maternal Grandfather   . Dementia Paternal Grandmother   . Bladder Cancer Neg Hx   . Kidney cancer Neg Hx     Social History:  reports that she has never smoked. She has never used smokeless tobacco. She reports current alcohol use. She reports that she does not use drugs.   Physical Exam: BP 131/80   Pulse 71   Ht _0  (1.651 m)   Wt 300 lb (136.1 kg)   BMI 49.92 kg/m   Constitutional:  Alert and oriented, No acute distress. HEENT: [place] AT, moist mucus membranes.  Trachea midline, no masses. Cardiovascular: No clubbing, cyanosis, or edema.  RRR Respiratory: Normal respiratory effort, no increased work of breathing.  Clear GI: Abdomen is soft, nontender, nondistended, no abdominal masses GU: No CVA tenderness Lymph: No cervical or inguinal lymphadenopathy. Skin: No rashes, bruises or suspicious lesions. Neurologic: Grossly intact, no focal deficits, moving all 4 extremities. Psychiatric: Normal mood and affect.   Pertinent Imaging: A KUB was obtained and the calculus is not definitely visualized on KUB.  Hounsfield units were ~600.  CT images were personally reviewed Results for orders placed during the hospital encounter of 06/23/19  CT Renal Stone Study    Narrative CLINICAL DATA:  Right-sided flank pain for several hours  EXAM: CT ABDOMEN AND PELVIS WITHOUT CONTRAST  TECHNIQUE: Multidetector CT imaging of the abdomen and pelvis was performed following the standard protocol without IV contrast.  COMPARISON:  01/27/2017  FINDINGS: Lower chest: No acute abnormality.  Hepatobiliary: No focal liver abnormality is seen. Status post cholecystectomy. No biliary dilatation.  Pancreas: Unremarkable. No pancreatic ductal dilatation or surrounding inflammatory changes.  Spleen: Normal in size without focal abnormality.  Adrenals/Urinary Tract: Adrenal glands are within normal limits. Left kidney demonstrates nonobstructing renal calculi. The largest of these lies in the lower pole and measures approximately 6 mm. The left ureter is unremarkable. The bladder is decompressed. The right kidney demonstrates focal enlargement as well as hydronephrosis secondary to a obstructing stone in the mid to distal ureter which measures approximately 6-7 mm. Nonobstructing stones are seen within the right kidney. The largest of these measures approximately 4 mm in greatest dimension.  Stomach/Bowel: The appendix is within normal limits. Colon shows no obstructive or inflammatory  changes. No small bowel or gastric abnormality is seen.  Vascular/Lymphatic: No significant vascular findings are present. No enlarged abdominal or pelvic lymph nodes.  Reproductive: Uterus and bilateral adnexa are unremarkable.  Other: No abdominal wall hernia or abnormality. No abdominopelvic ascites.  Musculoskeletal: No acute or significant osseous findings.  IMPRESSION: 6-7 mm right mid to distal ureteral stone with obstructive change.  Bilateral nonobstructing stones as described.  No other focal abnormality is seen.   Electronically Signed   By: Inez Catalina M.D.   On: 06/23/2019 21:10     Assessment & Plan:    - Right ureteral calculus  We discussed  various treatment options for urolithiasis including observation with or without medical expulsive therapy, shockwave lithotripsy (SWL), ureteroscopy and laser lithotripsy with stent placement.  We discussed that management is based on stone size, location, density, patient co-morbidities, and patient preference.   Stones <60m in size have a >80% spontaneous passage rate. Data surrounding the use of tamsulosin for medical expulsive therapy is controversial, but meta analyses suggests it is most efficacious for distal stones between 5-164min size.  She states she has not been able to pass stones previously.  SWL has a lower stone free rate in a single procedure, but also a lower complication rate compared to ureteroscopy and avoids a stent and associated stent related symptoms. Possible complications include renal hematoma, steinstrasse, and need for additional treatment.  Ureteroscopy with laser lithotripsy and stent placement has a higher stone free rate than SWL in a single procedure, however increased complication rate including possible infection, ureteral injury, bleeding, and stent related morbidity. Common stent related symptoms include dysuria, urgency/frequency, and flank pain.  After an extensive discussion of the risks and benefits of the above treatment options, the patient would like to proceed with ureteroscopic stone removal which will be scheduled this week.   ScAbbie SonsMDGrand Beach[address] Bishop DriveSuBarbertonuBergoo[place] [phone]

## 2019-06-25 LAB — URINALYSIS, COMPLETE
Bilirubin, UA: NEGATIVE
Glucose, UA: NEGATIVE
Ketones, UA: NEGATIVE
Leukocytes,UA: NEGATIVE
Nitrite, UA: NEGATIVE
Specific Gravity, UA: 1.02 (ref 1.005–1.030)
Urobilinogen, Ur: 0.2 mg/dL (ref 0.2–1.0)
pH, UA: 5.5 (ref 5.0–7.5)

## 2019-06-25 LAB — MICROSCOPIC EXAMINATION
Bacteria, UA: NONE SEEN
RBC, Urine: >30 /hpf — AB (ref 0–2)

## 2019-06-25 LAB — SARS CORONAVIRUS 2 (TAT 6-24 HRS): SARS Coronavirus 2: NEGATIVE

## 2019-06-25 MED ORDER — CIPROFLOXACIN IN D5W 400 MG/200ML IV SOLN
400.0000 mg | INTRAVENOUS | Status: AC
  Administered 2019-06-26: 400 mg via INTRAVENOUS

## 2019-06-26 ENCOUNTER — Ambulatory Visit
Admission: RE | Admit: 2019-06-26 | Discharge: 2019-06-26 | Disposition: A | Discharge status: Home / Self Care | Payer: BC Managed Care – PPO | Attending: Urology | Admitting: Urology

## 2019-06-26 ENCOUNTER — Encounter: Payer: Self-pay | Admitting: Urology

## 2019-06-26 ENCOUNTER — Ambulatory Visit: Payer: BC Managed Care – PPO | Admitting: Anesthesiology

## 2019-06-26 ENCOUNTER — Encounter
Admission: RE | Disposition: A | Discharge status: Home / Self Care | Payer: Self-pay | Source: Home / Self Care | Attending: Urology

## 2019-06-26 ENCOUNTER — Ambulatory Visit: Payer: BC Managed Care – PPO

## 2019-06-26 ENCOUNTER — Other Ambulatory Visit: Payer: Self-pay

## 2019-06-26 DIAGNOSIS — N200 Calculus of kidney: Secondary | ICD-10-CM

## 2019-06-26 DIAGNOSIS — Z86718 Personal history of other venous thrombosis and embolism: Secondary | ICD-10-CM | POA: Diagnosis not present

## 2019-06-26 DIAGNOSIS — N202 Calculus of kidney with calculus of ureter: Secondary | ICD-10-CM | POA: Diagnosis not present

## 2019-06-26 DIAGNOSIS — N201 Calculus of ureter: Secondary | ICD-10-CM | POA: Insufficient documentation

## 2019-06-26 DIAGNOSIS — Z6841 Body Mass Index (BMI) 40.0 and over, adult: Secondary | ICD-10-CM | POA: Diagnosis not present

## 2019-06-26 DIAGNOSIS — F418 Other specified anxiety disorders: Secondary | ICD-10-CM | POA: Diagnosis not present

## 2019-06-26 HISTORY — PX: CYSTOSCOPY W/ RETROGRADES: SHX1426

## 2019-06-26 HISTORY — PX: CYSTOSCOPY/URETEROSCOPY/HOLMIUM LASER/STENT PLACEMENT: SHX6546

## 2019-06-26 LAB — POCT PREGNANCY, URINE: Preg Test, Ur: NEGATIVE

## 2019-06-26 SURGERY — CYSTOSCOPY/URETEROSCOPY/HOLMIUM LASER/STENT PLACEMENT
Anesthesia: General | Site: Ureter | Laterality: Right

## 2019-06-26 MED ORDER — ACETAMINOPHEN 500 MG PO TABS
1000.0000 mg | ORAL_TABLET | Freq: Once | ORAL | Status: AC
  Administered 2019-06-26: 1000 mg via ORAL

## 2019-06-26 MED ORDER — FENTANYL CITRATE (PF) 100 MCG/2ML IJ SOLN
INTRAMUSCULAR | Status: AC
  Filled 2019-06-26: qty 2

## 2019-06-26 MED ORDER — FAMOTIDINE 20 MG PO TABS
ORAL_TABLET | ORAL | Status: AC
  Administered 2019-06-26: 20 mg via ORAL
  Filled 2019-06-26: qty 1

## 2019-06-26 MED ORDER — OXYCODONE HCL 5 MG PO TABS
5.0000 mg | ORAL_TABLET | Freq: Once | ORAL | Status: AC | PRN
  Administered 2019-06-26: 5 mg via ORAL

## 2019-06-26 MED ORDER — IOHEXOL 180 MG/ML  SOLN
INTRAMUSCULAR | Status: DC | PRN
  Administered 2019-06-26: 20 mL

## 2019-06-26 MED ORDER — OXYBUTYNIN CHLORIDE 5 MG PO TABS
ORAL_TABLET | ORAL | 0 refills | Status: DC
Start: 2019-06-26 — End: 2019-12-22

## 2019-06-26 MED ORDER — SUCCINYLCHOLINE CHLORIDE 20 MG/ML IJ SOLN
INTRAMUSCULAR | Status: DC | PRN
  Administered 2019-06-26: 120 mg via INTRAVENOUS

## 2019-06-26 MED ORDER — MIDAZOLAM HCL 2 MG/2ML IJ SOLN
INTRAMUSCULAR | Status: AC
  Filled 2019-06-26: qty 2

## 2019-06-26 MED ORDER — LIDOCAINE HCL (CARDIAC) PF 100 MG/5ML IV SOSY
PREFILLED_SYRINGE | INTRAVENOUS | Status: DC | PRN
  Administered 2019-06-26: 100 mg via INTRAVENOUS

## 2019-06-26 MED ORDER — ONDANSETRON HCL 4 MG/2ML IJ SOLN
INTRAMUSCULAR | Status: DC | PRN
  Administered 2019-06-26: 4 mg via INTRAVENOUS

## 2019-06-26 MED ORDER — PROPOFOL 10 MG/ML IV BOLUS
INTRAVENOUS | Status: DC | PRN
  Administered 2019-06-26: 200 mg via INTRAVENOUS

## 2019-06-26 MED ORDER — CIPROFLOXACIN IN D5W 400 MG/200ML IV SOLN
INTRAVENOUS | Status: AC
  Filled 2019-06-26: qty 200

## 2019-06-26 MED ORDER — OXYBUTYNIN CHLORIDE 5 MG PO TABS
5.0000 mg | ORAL_TABLET | Freq: Once | ORAL | Status: AC
  Administered 2019-06-26: 5 mg via ORAL
  Filled 2019-06-26: qty 1

## 2019-06-26 MED ORDER — ACETAMINOPHEN 500 MG PO TABS
ORAL_TABLET | ORAL | Status: AC
  Filled 2019-06-26: qty 2

## 2019-06-26 MED ORDER — SEVOFLURANE IN SOLN
RESPIRATORY_TRACT | Status: AC
  Filled 2019-06-26: qty 250

## 2019-06-26 MED ORDER — FENTANYL CITRATE (PF) 100 MCG/2ML IJ SOLN: 25.0000 ug | INTRAMUSCULAR | Status: DC | PRN

## 2019-06-26 MED ORDER — ROCURONIUM BROMIDE 100 MG/10ML IV SOLN
INTRAVENOUS | Status: DC | PRN
  Administered 2019-06-26 (×4): 10 mg via INTRAVENOUS

## 2019-06-26 MED ORDER — DEXAMETHASONE SODIUM PHOSPHATE 10 MG/ML IJ SOLN
INTRAMUSCULAR | Status: DC | PRN
  Administered 2019-06-26: 10 mg via INTRAVENOUS

## 2019-06-26 MED ORDER — FENTANYL CITRATE (PF) 100 MCG/2ML IJ SOLN
INTRAMUSCULAR | Status: DC | PRN
  Administered 2019-06-26: 100 ug via INTRAVENOUS

## 2019-06-26 MED ORDER — FAMOTIDINE 20 MG PO TABS: 20.0000 mg | ORAL_TABLET | Freq: Once | ORAL | Status: AC

## 2019-06-26 MED ORDER — MIDAZOLAM HCL 2 MG/2ML IJ SOLN
INTRAMUSCULAR | Status: DC | PRN
  Administered 2019-06-26: 2 mg via INTRAVENOUS

## 2019-06-26 MED ORDER — OXYCODONE HCL 5 MG PO TABS
ORAL_TABLET | ORAL | Status: AC
  Filled 2019-06-26: qty 1

## 2019-06-26 MED ORDER — OXYBUTYNIN CHLORIDE 5 MG PO TABS
ORAL_TABLET | ORAL | Status: AC
  Filled 2019-06-26: qty 1

## 2019-06-26 MED ORDER — LACTATED RINGERS IV SOLN: INTRAVENOUS | Status: DC

## 2019-06-26 MED ORDER — OXYCODONE HCL 5 MG/5ML PO SOLN: 5.0000 mg | Freq: Once | ORAL | Status: AC | PRN

## 2019-06-26 SURGICAL SUPPLY — 29 items
BAG DRAIN CYSTO-URO LG1000N (MISCELLANEOUS) ×3 IMPLANT
BASKET ZERO TIP 1.9FR (BASKET) IMPLANT
BRUSH SCRUB EZ 1% IODOPHOR (MISCELLANEOUS) ×3 IMPLANT
CATH URETL 5X70 OPEN END (CATHETERS) IMPLANT
CNTNR SPEC 2.5X3XGRAD LEK (MISCELLANEOUS)
CONT SPEC 4OZ STER OR WHT (MISCELLANEOUS)
CONTAINER SPEC 2.5X3XGRAD LEK (MISCELLANEOUS) IMPLANT
DRAPE UTILITY 15X26 TOWEL STRL (DRAPES) ×3 IMPLANT
FIBER LASER TRACTIP 200 (UROLOGICAL SUPPLIES) ×3 IMPLANT
GLOVE BIOGEL PI IND STRL 7.5 (GLOVE) ×1 IMPLANT
GLOVE BIOGEL PI INDICATOR 7.5 (GLOVE) ×2
GOWN STRL REUS W/ TWL LRG LVL3 (GOWN DISPOSABLE) ×1 IMPLANT
GOWN STRL REUS W/ TWL XL LVL3 (GOWN DISPOSABLE) ×1 IMPLANT
GOWN STRL REUS W/TWL LRG LVL3 (GOWN DISPOSABLE) ×2
GOWN STRL REUS W/TWL XL LVL3 (GOWN DISPOSABLE) ×2
GUIDEWIRE STR DUAL SENSOR (WIRE) ×5 IMPLANT
INFUSOR MANOMETER BAG 3000ML (MISCELLANEOUS) ×3 IMPLANT
INTRODUCER DILATOR DOUBLE (INTRODUCER) IMPLANT
KIT TURNOVER CYSTO (KITS) ×3 IMPLANT
PACK CYSTO AR (MISCELLANEOUS) ×3 IMPLANT
SET CYSTO W/LG BORE CLAMP LF (SET/KITS/TRAYS/PACK) ×3 IMPLANT
SHEATH URETERAL 12FRX35CM (MISCELLANEOUS) IMPLANT
SOL .9 NS 3000ML IRR  AL (IV SOLUTION) ×2
SOL .9 NS 3000ML IRR UROMATIC (IV SOLUTION) ×1 IMPLANT
STENT URET 6FRX24 CONTOUR (STENTS) ×2 IMPLANT
STENT URET 6FRX26 CONTOUR (STENTS) IMPLANT
SURGILUBE 2OZ TUBE FLIPTOP (MISCELLANEOUS) ×3 IMPLANT
VALVE UROSEAL ADJ ENDO (VALVE) ×2 IMPLANT
WATER STERILE IRR 1000ML POUR (IV SOLUTION) ×3 IMPLANT

## 2019-06-26 NOTE — OR Nursing (Signed)
Per Dr. Elenor Quinones, office will call patient to schedule follow up appt; which will be approx. 4 weeks - added this to discharge instructions and deleted the "today" follow up note that was on instructions.

## 2019-06-26 NOTE — Anesthesia Procedure Notes (Signed)
Procedure Name: Intubation Date/Time: 06/26/2019 11:31 AM Performed by: Nelda Marseille, CRNA Pre-anesthesia Checklist: Patient identified, Patient being monitored, Timeout performed, Emergency Drugs available and Suction available Patient Re-evaluated:Patient Re-evaluated prior to induction Oxygen Delivery Method: Circle system utilized Preoxygenation: Pre-oxygenation with 100% oxygen Induction Type: IV induction Ventilation: Mask ventilation without difficulty Laryngoscope Size: Mac, 4 and McGraph Grade View: Grade I Tube type: Oral Tube size: 7.0 mm Number of attempts: 1 Airway Equipment and Method: Stylet Placement Confirmation: ETT inserted through vocal cords under direct vision,  positive ETCO2 and breath sounds checked- equal and bilateral Secured at: 21 cm Tube secured with: Tape Dental Injury: Teeth and Oropharynx as per pre-operative assessment

## 2019-06-26 NOTE — Anesthesia Preprocedure Evaluation (Signed)
Anesthesia Evaluation  Patient identified by MRN, date of birth, ID band Patient awake    Reviewed: Allergy & Precautions, H&P , NPO status , Patient's Chart, lab work & pertinent test results  History of Anesthesia Complications Negative for: history of anesthetic complications  Airway Mallampati: III  TM Distance: >3 FB Neck ROM: full    Dental  (+) Chipped   Pulmonary neg pulmonary ROS, neg shortness of breath,    Pulmonary exam normal        Cardiovascular Exercise Tolerance: Good (-) angina(-) Past MI and (-) DOE negative cardio ROS Normal cardiovascular exam     Neuro/Psych  Headaches, PSYCHIATRIC DISORDERS    GI/Hepatic negative GI ROS, Neg liver ROS,   Endo/Other  negative endocrine ROS  Renal/GU Renal disease     Musculoskeletal   Abdominal   Peds  Hematology negative hematology ROS (+)   Anesthesia Other Findings Past Medical History: No date: Anemia No date: Anxiety 2012: Cellulitis     Comment:  caused stillbirth 06/2011: DVT (deep venous thrombosis) ([place])     Comment:  under right clavicle No date: Dysmenorrhea No date: Dyspnea 11/2013: Family history of breast cancer     Comment:  BRCA/MyRisk neg No date: H/O blood clots No date: Headache     Comment:  related to hormonal meds No date: History of kidney stones No date: History of prothrombin mutation 11/2013: Increased risk of breast cancer     Comment:  IBIS=27% No date: Obesity  Past Surgical History: No date: CHOLECYSTECTOMY 02/02/2017: CYSTOSCOPY W/ RETROGRADES; Left     Comment:  Procedure: CYSTOSCOPY WITH RETROGRADE PYELOGRAM;                Surgeon: Abbie Sons, MD;  Location: [facility];                Service: Urology;  Laterality: Left; 02/02/2017: CYSTOSCOPY/URETEROSCOPY/HOLMIUM LASER/STENT PLACEMENT;  Left     Comment:  Procedure: CYSTOSCOPY/URETEROSCOPY/HOLMIUM LASER/STENT               PLACEMENT;  Surgeon: Abbie Sons, MD;  Location:               [facility];  Service: Urology;  Laterality: Left; No date: DILITATION & CURRETTAGE/HYSTROSCOPY WITH ESSURE 12/24/2018: DILITATION & CURRETTAGE/HYSTROSCOPY WITH NOVASURE  ABLATION; N/A     Comment:  Procedure: DILATATION & CURETTAGE/HYSTEROSCOPY WITH               ENDOMETRIAL ABLATION ([place]);  Surgeon: Will Bonnet, MD;  Location: [facility];  Service: Gynecology;              Laterality: N/A; No date: INTRAUTERINE DEVICE (IUD) INSERTION No date: LITHOTRIPSY No date: TONSILLECTOMY No date: TONSILLECTOMY AND ADENOIDECTOMY No date: TUBAL LIGATION  BMI    Body Mass Index: 49.92 kg/m      Reproductive/Obstetrics negative OB ROS                             Anesthesia Physical Anesthesia Plan  ASA: III  Anesthesia Plan: General ETT   Post-op Pain Management:    Induction: Intravenous  PONV Risk Score and Plan: Ondansetron, Dexamethasone, Midazolam and Treatment may vary due to age or medical condition  Airway Management Planned: Oral ETT  Additional Equipment:   Intra-op Plan:   Post-operative Plan: Extubation in OR  Informed Consent: I have reviewed the patients History and Physical, chart, labs and discussed the procedure including the risks, benefits and alternatives for the proposed anesthesia with the patient or authorized representative who has indicated his/her understanding and acceptance.     Dental Advisory Given  Plan Discussed with: Anesthesiologist, CRNA and Surgeon  Anesthesia Plan Comments: (Patient consented for risks of anesthesia including but not limited to:  - adverse reactions to medications - damage to eyes, teeth, lips or other oral mucosa - nerve damage due to positioning  - sore throat or hoarseness - Damage to heart, brain, nerves, lungs or loss of life  Patient voiced understanding.)        Anesthesia Quick Evaluation

## 2019-06-26 NOTE — Interval H&P Note (Signed)
History and Physical Interval Note:  06/26/2019 11:22 AM  Diana Sexton  has presented today for surgery, with the diagnosis of right ureteral calculus.  The various methods of treatment have been discussed with the patient and family. After consideration of risks, benefits and other options for treatment, the patient has consented to  Procedure(s): CYSTOSCOPY/URETEROSCOPY/HOLMIUM LASER/STENT PLACEMENT (Right) as a surgical intervention.  The patient's history has been reviewed, patient examined, no change in status, stable for surgery.  I have reviewed the patient's chart and labs.  Questions were answered to the patient's satisfaction.     [place]

## 2019-06-26 NOTE — Discharge Instructions (Signed)
DISCHARGE INSTRUCTIONS FOR KIDNEY STONE/URETERAL STENT   MEDICATIONS:  1. Resume all your other meds from home.  2.  AZO (over-the-counter) can help with the burning/stinging when you urinate. 3. oxybutinin is for stent pain, Rx was sent to your pharmacy.  ACTIVITY:  1. May resume regular activities in 24 hours. 2. No driving while on narcotic pain medications  3. Drink plenty of water  4. Continue to walk at home - you can still get blood clots when you are at home, so keep active, but don't over do it.  5. May return to work/school tomorrow or when you feel ready   BATHING:  1. You can shower. 2. You have a string coming from your urethra: The stent string is attached to your ureteral stent. Do not pull on this.   SIGNS/SYMPTOMS TO CALL:  Please call us if you have a fever greater than 101.5, uncontrolled nausea/vomiting, uncontrolled pain, dizziness, unable to urinate, bloody urine, chest pain, shortness of breath, leg swelling, leg pain, or any other concerns or questions.   You can reach [place] at [phone].   FOLLOW-UP:  1. You will be contacted re a follow-up appointment  2. You have a string attached to your stent, you may remove it on Sunday 5/16 in AM. To do this, pull the string until the stent is completely removed. You may feel an odd sensation in your back.

## 2019-06-27 ENCOUNTER — Other Ambulatory Visit: Payer: Self-pay

## 2019-06-27 DIAGNOSIS — T83122A Displacement of urinary stent, initial encounter: Secondary | ICD-10-CM | POA: Insufficient documentation

## 2019-06-27 DIAGNOSIS — R310 Gross hematuria: Secondary | ICD-10-CM | POA: Insufficient documentation

## 2019-06-27 DIAGNOSIS — Z9889 Other specified postprocedural states: Secondary | ICD-10-CM | POA: Insufficient documentation

## 2019-06-27 DIAGNOSIS — Y69 Unspecified misadventure during surgical and medical care: Secondary | ICD-10-CM | POA: Insufficient documentation

## 2019-06-27 DIAGNOSIS — R110 Nausea: Secondary | ICD-10-CM | POA: Insufficient documentation

## 2019-06-27 DIAGNOSIS — N23 Unspecified renal colic: Secondary | ICD-10-CM | POA: Insufficient documentation

## 2019-06-27 DIAGNOSIS — R1031 Right lower quadrant pain: Secondary | ICD-10-CM | POA: Diagnosis not present

## 2019-06-27 LAB — URINALYSIS, COMPLETE (UACMP) WITH MICROSCOPIC
RBC / HPF: >50 RBC/hpf — ABNORMAL HIGH (ref 0–5)
Specific Gravity, Urine: 1.023 (ref 1.005–1.030)
Squamous Epithelial / HPF: NONE SEEN (ref 0–5)
WBC, UA: >50 WBC/hpf — ABNORMAL HIGH (ref 0–5)

## 2019-06-27 LAB — CBC
HCT: 37.6 % (ref 36.0–46.0)
Hemoglobin: 11.5 g/dL — ABNORMAL LOW (ref 12.0–15.0)
MCH: 23.2 pg — ABNORMAL LOW (ref 26.0–34.0)
MCHC: 30.6 g/dL (ref 30.0–36.0)
MCV: 76 fL — ABNORMAL LOW (ref 80.0–100.0)
Platelets: 446 10*3/uL — ABNORMAL HIGH (ref 150–400)
RBC: 4.95 MIL/uL (ref 3.87–5.11)
RDW: 15.8 % — ABNORMAL HIGH (ref 11.5–15.5)
WBC: 11.8 10*3/uL — ABNORMAL HIGH (ref 4.0–10.5)
nRBC: 0 % (ref 0.0–0.2)

## 2019-06-27 LAB — BASIC METABOLIC PANEL
Anion gap: 7 (ref 5–15)
BUN: 15 mg/dL (ref 6–20)
CO2: 27 mmol/L (ref 22–32)
Calcium: 8.8 mg/dL — ABNORMAL LOW (ref 8.9–10.3)
Chloride: 104 mmol/L (ref 98–111)
Creatinine, Ser: 0.8 mg/dL (ref 0.44–1.00)
GFR calc Af Amer: >60 mL/min (ref >60)
GFR calc non Af Amer: >60 mL/min (ref >60)
Glucose, Bld: 107 mg/dL — ABNORMAL HIGH (ref 70–99)
Potassium: 3.8 mmol/L (ref 3.5–5.1)
Sodium: 138 mmol/L (ref 135–145)

## 2019-06-27 NOTE — Anesthesia Postprocedure Evaluation (Signed)
Anesthesia Post Note  Patient: Diana Sexton  Procedure(s) Performed: CYSTOSCOPY/URETEROSCOPY/HOLMIUM LASER/STENT PLACEMENT (Right Ureter) CYSTOSCOPY WITH RETROGRADE PYELOGRAM (Right Ureter)  Patient location during evaluation: PACU Anesthesia Type: General Level of consciousness: awake and alert Pain management: pain level controlled Vital Signs Assessment: post-procedure vital signs reviewed and stable Respiratory status: spontaneous breathing, nonlabored ventilation, respiratory function stable and patient connected to nasal cannula oxygen Cardiovascular status: blood pressure returned to baseline and stable Postop Assessment: no apparent nausea or vomiting Anesthetic complications: no     Last Vitals:  Vitals:   06/26/19 1317 06/26/19 1411  BP: 114/73 128/74  Pulse: 70 68  Resp: 18 18  Temp: 36.4 C   SpO2: 98% 98%    Last Pain:  Vitals:   06/27/19 0832  TempSrc:   PainSc: 2                  Precious Haws Amaree Leeper

## 2019-06-27 NOTE — ED Triage Notes (Signed)
Patient states that she was diagnosed with kidney stones on Monday. Patient states that she had surgery Thursday and had a stent placed. Patient states that the stent came out today while urinating. Patient states that about an hour or two after the sent coming out she started having a lot of right lower abdomen and back pain.

## 2019-06-27 NOTE — Transfer of Care (Signed)
Immediate Anesthesia Transfer of Care Note  Patient: Diana Sexton  Procedure(s) Performed: CYSTOSCOPY/URETEROSCOPY/HOLMIUM LASER/STENT PLACEMENT (Right Ureter) CYSTOSCOPY WITH RETROGRADE PYELOGRAM (Right Ureter)  Patient Location: PACU  Anesthesia Type:General  Level of Consciousness: sedated  Airway & Oxygen Therapy: Patient Spontanous Breathing and Patient connected to face mask oxygen  Post-op Assessment: Report given to RN and Post -op Vital signs reviewed and stable  Post vital signs: Reviewed and stable  Last Vitals:  Vitals Value Taken Time  BP 128/74 06/26/19 1411  Temp 36.4 C 06/26/19 1317  Pulse 68 06/26/19 1411  Resp 18 06/26/19 1411  SpO2 98 % 06/26/19 1411    Last Pain:  Vitals:   06/27/19 0832  TempSrc:   PainSc: 2       Patients Stated Pain Goal: 0 ([id number])  Complications: No apparent anesthesia complications

## 2019-06-28 ENCOUNTER — Emergency Department
Admission: EM | Admit: 2019-06-28 | Discharge: 2019-06-28 | Disposition: A | Discharge status: Home / Self Care | Payer: BC Managed Care – PPO | Attending: Emergency Medicine | Admitting: Emergency Medicine

## 2019-06-28 DIAGNOSIS — T83122A Displacement of urinary stent, initial encounter: Secondary | ICD-10-CM

## 2019-06-28 DIAGNOSIS — N23 Unspecified renal colic: Secondary | ICD-10-CM

## 2019-06-28 MED ORDER — ONDANSETRON HCL 4 MG/2ML IJ SOLN
4.0000 mg | INTRAMUSCULAR | Status: AC
  Administered 2019-06-28: 4 mg via INTRAVENOUS
  Filled 2019-06-28: qty 2

## 2019-06-28 MED ORDER — KETOROLAC TROMETHAMINE 30 MG/ML IJ SOLN
15.0000 mg | Freq: Once | INTRAMUSCULAR | Status: AC
  Administered 2019-06-28: 15 mg via INTRAVENOUS
  Filled 2019-06-28: qty 1

## 2019-06-28 MED ORDER — MORPHINE SULFATE (PF) 4 MG/ML IV SOLN
4.0000 mg | Freq: Once | INTRAVENOUS | Status: AC
  Administered 2019-06-28: 4 mg via INTRAVENOUS
  Filled 2019-06-28: qty 1

## 2019-06-28 NOTE — Discharge Instructions (Signed)
As we discussed, Dr. Bernardo Heater felt that what you are experiencing is normal after the procedure you had and then subsequently having the ureteral stent removed.  Please continue to use the pain medicine that you have at home according to label instructions.  Drink plenty of fluids.  Follow-up with the urology office on Monday as needed and return to the emergency department with new or worsening symptoms that concern you.

## 2019-06-28 NOTE — ED Notes (Signed)
Reviewed discharge instructions and follow-up care with patient. Patient verbalized understanding of all information reviewed. Patient stable, with no distress noted at this time.    

## 2019-06-28 NOTE — Op Note (Signed)
Preoperative diagnosis: Right mid ureteral calculus  Postoperative diagnosis: Same  Procedure:  1. Cystoscopy 2. Right ureteroscopy and stone removal 3. Ureteroscopic laser lithotripsy 4. Right ureteral stent placement (6FR) 24 cm 5. Right retrograde pyelography with interpretation  Surgeon: Nicki Reaper C. Alyra Patty, M.D.  Anesthesia: General  Complications: None  Intraoperative findings:  1.  Right retrograde pyelography post procedure showed no filling defects, stone fragments or contrast extravasation  EBL: Minimal  Specimens: 1. None   Indication: Diana Sexton is a 34 y.o. with a history of recurrent calcium oxalate stone disease.  She recently presented to the ED with right renal colic and CT showing a 7 mm right mid ureteral calculus with upstream hydronephrosis.  She also has a 4 mm nonobstructing right renal calculus.  After reviewing the management options for treatment, the patient elected to proceed with the above surgical procedure(s). We have discussed the potential benefits and risks of the procedure, side effects of the proposed treatment, the likelihood of the patient achieving the goals of the procedure, and any potential problems that might occur during the procedure or recuperation. Informed consent has been obtained.  Description of procedure:  The patient was taken to the operating room and general anesthesia was induced.  The patient was placed in the dorsal lithotomy position, prepped and draped in the usual sterile fashion, and preoperative antibiotics were administered. A preoperative time-out was performed.   A 21 French cystoscope was lubricated and passed per urethra.  Panendoscopy was performed and the bladder mucosa showed no erythema, solid or papillary lesions.  Attention was directed to the right ureteral orifice and a 0.038 Sensor wire was then advanced up the ureter into the renal pelvis under fluoroscopic guidance.  A 4.5 Fr semirigid ureteroscope  was unable to be advanced into the ureter.  A second sensor wire was placed through the ureteroscope and into the right ureteral orifice and the ureteroscope was easily advanced over the wire.  Once in the distal ureter the second guidewire was removed and the ureteroscope was advanced proximally.  The calculus was identified and the lower portion of the mid ureter.  The calculus was then fragmented with a 200 micron holmium laser fiber on a setting of 0.8 J and frequency of 8 hz.   All fragments were then removed from the ureter with a zero tip nitinol basket.  Reinspection of the ureter revealed no remaining visible stones or fragments.   The semirigid ureteroscope was removed and a single channel digital ureteroscope was then passed per urethra.  A second Sensor wire was placed through the flexible ureteroscope and the ureteral orifice.  The flexible ureteroscope was unable to be advanced beyond the first centimeter of the distal ureter.  She has not previously tolerated stents well and in order to minimize stent dwell time it was elected not to dilate the distal ureter.  Retrograde pyelogram was performed through the ureteroscope with findings as described above.  A 6 FR/24 CM stent was placed under fluoroscopic guidance.  The wire was then removed with an adequate stent curl noted in the renal pelvis as well as in the bladder.  The bladder was then emptied and the procedure ended.  The patient appeared to tolerate the procedure well and without complications.  After anesthetic reversal the patient was transported to the PACU in stable condition.   Plan: The stent was left attached to a tether, cut short and tucked into the vaginal vault.  She was instructed to remove the  stent in the a.m. 06/29/2019.  John Giovanni, MD

## 2019-06-28 NOTE — ED Provider Notes (Signed)
[facility] Emergency Department Provider Note  ____________________________________________   First MD Initiated Contact with Patient 06/28/19 0205     (approximate)  I have reviewed the triage vital signs and the nursing notes.   HISTORY  Chief Complaint Post-op Problem and Flank Pain    HPI Diana Sexton is a 34 y.o. female with medical history as listed below who presents for evaluation of acute onset and severe pain in her right flank as well as hematuria in the setting of a recent ureteral stent placement.  She was diagnosed with a large right ureteral stone earlier in the week.  She subsequently saw Dr. Bernardo Heater  and had a ureteral stent placed 2 days ago.  She reports that earlier tonight she was urinating and the stent came out when she wipes.  She had near immediate onset of pain in the right flank that was not as bad as her original kidney stone diagnosis but is still severe.  She also reports having gross blood come out in her urine.  She reports that she called Dr. Bernardo Heater who told her to come directly and immediately to the emergency department.  She denies fever.  She has had some nausea but no vomiting.  She denies sore throat, chest pain, and shortness of breath.  Nothing in particular makes the symptoms better or worse.        Past Medical History:  Diagnosis Date  . Anemia   . Anxiety   . Cellulitis 2012   caused stillbirth  . DVT (deep venous thrombosis) ([place]) 06/2011   under right clavicle  . Dysmenorrhea   . Dyspnea   . Family history of breast cancer 11/2013   BRCA/MyRisk neg  . H/O blood clots   . Headache    related to hormonal meds  . History of kidney stones   . History of prothrombin mutation   . Increased risk of breast cancer 11/2013   IBIS=27%  . Obesity     Patient Active Problem List   Diagnosis Date Noted  . Sprain of left knee 04/14/2019  . DVT (deep venous thrombosis) ([place]) 02/09/2018  . Menorrhagia  with irregular cycle 10/13/2016  . Other irritable bowel syndrome 05/02/2016  . Urinary tract infection 01/21/2015  . Anxiety and depression 01/21/2015  . Nephrolithiasis 01/15/2015  . Abnormal radiologic findings on diagnostic imaging of renal pelvis, ureter, or bladder 01/15/2015  . Low HDL (under 40) 01/01/2015  . Elevated LDL cholesterol level 01/01/2015  . Insomnia 12/16/2014  . Morbid obesity ([place]) 12/11/2014  . Hematuria 12/11/2014  . Preventative health care 12/11/2014  . Low back pain 10/13/2014  . Prothrombin gene mutation ([place]) 10/13/2014  . Hydronephrosis 06/07/2012  . Incomplete emptying of bladder 06/07/2012  . Renal colic 52/77/8242  . Ureteric stone 06/07/2012    Past Surgical History:  Procedure Laterality Date  . CHOLECYSTECTOMY    . CYSTOSCOPY W/ RETROGRADES Left 02/02/2017   Procedure: CYSTOSCOPY WITH RETROGRADE PYELOGRAM;  Surgeon: Abbie Sons, MD;  Location: [facility] ORS;  Service: Urology;  Laterality: Left;  . CYSTOSCOPY W/ RETROGRADES Right 06/26/2019   Procedure: CYSTOSCOPY WITH RETROGRADE PYELOGRAM;  Surgeon: Abbie Sons, MD;  Location: [facility] ORS;  Service: Urology;  Laterality: Right;  . CYSTOSCOPY/URETEROSCOPY/HOLMIUM LASER/STENT PLACEMENT Left 02/02/2017   Procedure: CYSTOSCOPY/URETEROSCOPY/HOLMIUM LASER/STENT PLACEMENT;  Surgeon: Abbie Sons, MD;  Location: [facility] ORS;  Service: Urology;  Laterality: Left;  . CYSTOSCOPY/URETEROSCOPY/HOLMIUM LASER/STENT PLACEMENT Right 06/26/2019   Procedure: CYSTOSCOPY/URETEROSCOPY/HOLMIUM LASER/STENT PLACEMENT;  Surgeon: Abbie Sons, MD;  Location: [facility] OR[facility];  Service: Urology;  Laterality: Right;  . DILITATION & CURRETTAGE/HYSTROSCOPY WITH ESSURE    . DILITATION & CURRETTAGE/HYSTROSCOPY WITH NOVASURE ABLATION N/A 12/24/2018   Procedure: DILATATION & CURETTAGE/HYSTEROSCOPY WITH ENDOMETRIAL ABLATION (MINERVA SYSTEM);  Surgeon: Will Bonnet, MD;  Location: [facility] ORS;  Service: Gynecology;  Laterality: N/A;   . INTRAUTERINE DEVICE (IUD) INSERTION    . LITHOTRIPSY    . TONSILLECTOMY    . TONSILLECTOMY AND ADENOIDECTOMY    . TUBAL LIGATION      Prior to Admission medications   Medication Sig Start Date End Date Taking? Authorizing Provider  albuterol (VENTOLIN HFA) 108 (90 Base) MCG/ACT inhaler Inhale 2 puffs into the lungs every 6 (six) hours as needed for wheezing or shortness of breath. 12/02/18   Lavonia Drafts, MD  ondansetron (ZOFRAN) 4 MG tablet Take 1 tablet (4 mg total) by mouth daily as needed for nausea or vomiting. 06/23/19   Harvest Dark, MD  oxybutynin (DITROPAN) 5 MG tablet 1 tab tid prn frequency,urgency, bladder spasm 06/26/19   Stoioff, Ronda Fairly, MD  oxyCODONE-acetaminophen (PERCOCET) 5-325 MG tablet Take 1 tablet by mouth every 4 (four) hours as needed for severe pain. 06/23/19   Harvest Dark, MD  tamsulosin (FLOMAX) 0.4 MG CAPS capsule Take 1 capsule (0.4 mg total) by mouth daily. 06/23/19   Harvest Dark, MD  traZODone (DESYREL) 50 MG tablet Take 0.5-1 tablets (25-50 mg total) by mouth at bedtime as needed for sleep (and anxiety). Patient taking differently: Take 25 mg by mouth at bedtime as needed for sleep (and anxiety).  04/14/19   Carnella Guadalajara I, NP    Allergies Penicillins  Family History  Problem Relation Age of Onset  . Cancer Mother        breast  . Diabetes Father   . Hypertension Father   . Heart disease Maternal Grandfather   . Dementia Paternal Grandmother   . Bladder Cancer Neg Hx   . Kidney cancer Neg Hx     Social History Social History   Tobacco Use  . Smoking status: Never Smoker  . Smokeless tobacco: Never Used  Substance Use Topics  . Alcohol use: Yes    Comment: 1 drink per month  . Drug use: No    Review of Systems Constitutional: No fever/chills Eyes: No visual changes. ENT: No sore throat. Cardiovascular: Denies chest pain. Respiratory: Denies shortness of breath. Gastrointestinal: Right flank and right-sided  abdominal pain with some nausea. Genitourinary: Negative for dysuria. Musculoskeletal: Right flank pain. Integumentary: Negative for rash. Neurological: Negative for headaches, focal weakness or numbness.   ____________________________________________   PHYSICAL EXAM:  VITAL SIGNS: ED Triage Vitals [06/27/19 2242]  Enc Vitals Group     BP (!) 146/87     Pulse Rate 63     Resp 18     Temp 97.9 F (36.6 C)     Temp Source Oral     SpO2 100 %     Weight 136.1 kg (300 lb)     Height 1.651 m (_0 )     Head Circumference      Peak Flow      Pain Score 8     Pain Loc      Pain Edu?      Excl. in [place]?     Constitutional: Alert and oriented.  Appears uncomfortable. Eyes: Conjunctivae are normal.  Head: Atraumatic. Nose: No congestion/rhinnorhea. Mouth/Throat: Patient is wearing a mask. Neck:  No stridor.  No meningeal signs.   Cardiovascular: Normal rate, regular rhythm. Good peripheral circulation. Grossly normal heart sounds. Respiratory: Normal respiratory effort.  No retractions. Gastrointestinal: Soft and nontender. No distention.  Musculoskeletal: Right CVA tenderness to percussion.  No lower extremity tenderness nor edema. No gross deformities of extremities. Neurologic:  Normal speech and language. No gross focal neurologic deficits are appreciated.  Skin:  Skin is warm, dry and intact. Psychiatric: Mood and affect are normal. Speech and behavior are normal.  ____________________________________________   LABS (all labs ordered are listed, but only abnormal results are displayed)  Labs Reviewed  URINALYSIS, COMPLETE (UACMP) WITH MICROSCOPIC - Abnormal; Notable for the following components:      Result Value   Color, Urine RED (*)    APPearance CLOUDY (*)    Glucose, UA   (*)    Value: TEST NOT REPORTED DUE TO COLOR INTERFERENCE OF URINE PIGMENT   Hgb urine dipstick   (*)    Value: TEST NOT REPORTED DUE TO COLOR INTERFERENCE OF URINE PIGMENT   Bilirubin Urine    (*)    Value: TEST NOT REPORTED DUE TO COLOR INTERFERENCE OF URINE PIGMENT   Ketones, ur   (*)    Value: TEST NOT REPORTED DUE TO COLOR INTERFERENCE OF URINE PIGMENT   Protein, ur   (*)    Value: TEST NOT REPORTED DUE TO COLOR INTERFERENCE OF URINE PIGMENT   Nitrite   (*)    Value: TEST NOT REPORTED DUE TO COLOR INTERFERENCE OF URINE PIGMENT   Leukocytes,Ua   (*)    Value: TEST NOT REPORTED DUE TO COLOR INTERFERENCE OF URINE PIGMENT   RBC / HPF >50 (*)    WBC, UA >50 (*)    Bacteria, UA MANY (*)    All other components within normal limits  BASIC METABOLIC PANEL - Abnormal; Notable for the following components:   Glucose, Bld 107 (*)    Calcium 8.8 (*)    All other components within normal limits  CBC - Abnormal; Notable for the following components:   WBC 11.8 (*)    Hemoglobin 11.5 (*)    MCV 76.0 (*)    MCH 23.2 (*)    RDW 15.8 (*)    Platelets 446 (*)    All other components within normal limits   ____________________________________________  EKG  None - EKG not ordered by ED physician ____________________________________________  RADIOLOGY Ursula Alert, personally viewed and evaluated these images (plain radiographs) as part of my medical decision making, as well as reviewing the written report by the radiologist.  ED MD interpretation:  No indication for emergent imaging  Official radiology report(s): No results found.  ____________________________________________   PROCEDURES   Procedure(s) performed (including Critical Care):  Procedures   ____________________________________________   INITIAL IMPRESSION / MDM / [place] / ED COURSE  As part of my medical decision making, I reviewed the following data within the [place] notes reviewed and incorporated, Labs reviewed , Old chart reviewed, Discussed with urologist (Dr. Bernardo Heater) and reviewed Notes from prior ED visits   Differential diagnosis includes, but  is not limited to, trauma due to stent placement and subsequent accidental removal, retained stone, UTI/pyelonephritis.  Patient has gross hematuria, normal metabolic panel, and essentially normal CBC.  I will discussed the case with urology before getting any additional imaging.  Will place IV and provide morphine 4 mg IV and Zofran 4 mg IV.  Clinical Course as of Jun 27 305  Sat Jun 28, 2019  0218 Paged Dr. Bernardo Heater   [CF]  [phone] Paging Dr. Bernardo Heater again.   [CF]  [id number] I spoke by phone with Dr. Bernardo Heater and discussed the case.  He remembers the patient and said that he was able to remove the stone in its entirety.  He did not see an indication for emergent imaging at this time.  He did not feel that a urine culture was necessary.  He recommended pain control and follow-up after the weekend although he felt that she is likely just experiencing some trauma from the procedure and some ureteral edema that should resolve over the next couple of days.  I will update the patient.   [CF]  [id number] Patient reports that she feels much better after the morphine and her pain is at about a 3.  She is comfortable with the plan to go home after I updated her with the information from Dr. Bernardo Heater.  I have ordered Toradol 15 mg IV to help a little bit more with the pain and inflammation and she will continue taking the Tylenol or Norco that she has at home.  She will follow up with Dr. Bernardo Heater on Monday.  I gave my usual and customary return precautions.   [CF]    Clinical Course User Index [CF] Hinda Kehr, MD     ____________________________________________  FINAL CLINICAL IMPRESSION(S) / ED DIAGNOSES  Final diagnoses:  Ureteral colic  Ureteral stent displacement, initial encounter ([place])     MEDICATIONS GIVEN DURING THIS VISIT:  Medications  ketorolac (TORADOL) 30 MG/ML injection 15 mg (has no administration in time range)  morphine 4 MG/ML injection 4 mg (4 mg Intravenous Given 06/28/19 0235)   ondansetron (ZOFRAN) injection 4 mg (4 mg Intravenous Given 06/28/19 0234)     ED Discharge Orders    None      *Please note:  Diana Sexton was evaluated in Emergency Department on 06/28/2019 for the symptoms described in the history of present illness. She was evaluated in the context of the global COVID-19 pandemic, which necessitated consideration that the patient might be at risk for infection with the SARS-CoV-2 virus that causes COVID-19. Institutional protocols and algorithms that pertain to the evaluation of patients at risk for COVID-19 are in a state of rapid change based on information released by regulatory bodies including the CDC and federal and state organizations. These policies and algorithms were followed during the patient's care in the ED.  Some ED evaluations and interventions may be delayed as a result of limited staffing during the pandemic.*  Note:  This document was prepared using Dragon voice recognition software and may include unintentional dictation errors.   Hinda Kehr, MD 06/28/19 [phone]

## 2019-07-01 ENCOUNTER — Telehealth: Payer: Self-pay

## 2019-07-01 NOTE — Telephone Encounter (Signed)
Patient reports she visited the ED on Saturday due to her stent falling out. She is still experiencing gross hematuria, nausea, pressure and a strong urge to void that is constant. Patient would like to be seen to rule out infection and follow up from her ED visit. Appt made with Dr Bernardo Heater for 07/02/19.

## 2019-07-02 ENCOUNTER — Encounter: Payer: Self-pay | Admitting: Urology

## 2019-07-02 ENCOUNTER — Encounter: Payer: Self-pay | Admitting: Nurse Practitioner

## 2019-07-02 ENCOUNTER — Ambulatory Visit (INDEPENDENT_AMBULATORY_CARE_PROVIDER_SITE_OTHER): Payer: BC Managed Care – PPO | Admitting: Urology

## 2019-07-02 ENCOUNTER — Other Ambulatory Visit: Payer: Self-pay

## 2019-07-02 VITALS — BP 138/88 | HR 93 | Ht 67.0 in | Wt 300.0 lb

## 2019-07-02 DIAGNOSIS — N200 Calculus of kidney: Secondary | ICD-10-CM

## 2019-07-02 NOTE — Progress Notes (Signed)
07/01/19 11:20 PM   Diana Sexton 07/12/1985 [id number]  Referring provider: Helayne Seminole, NP [address] [place],  [place] [address] No chief complaint on file.   HPI: Diana Sexton is a 34 y.o. female status post ureteroscopic removal of a right mid ureteral calculus on 06/26/2019  -Stent was left on a tether and was to be removed on 5/16 -Inadvertently removed 06/27/2019 and seen in the ED for right renal colic -Severe pain has resolved however complains of pressure pain with urge to urinate, hematuria, and nausea during the day -Denies fever or chills  PMH: Past Medical History:  Diagnosis Date  . Anemia   . Anxiety   . Cellulitis 2012   caused stillbirth  . DVT (deep venous thrombosis) ([place]) 06/2011   under right clavicle  . Dysmenorrhea   . Dyspnea   . Family history of breast cancer 11/2013   BRCA/MyRisk neg  . H/O blood clots   . Headache    related to hormonal meds  . History of kidney stones   . History of prothrombin mutation   . Increased risk of breast cancer 11/2013   IBIS=27%  . Obesity     Surgical History: Past Surgical History:  Procedure Laterality Date  . CHOLECYSTECTOMY    . CYSTOSCOPY W/ RETROGRADES Left 02/02/2017   Procedure: CYSTOSCOPY WITH RETROGRADE PYELOGRAM;  Surgeon: Abbie Sons, MD;  Location: [facility] OR[facility];  Service: Urology;  Laterality: Left;  . CYSTOSCOPY W/ RETROGRADES Right 06/26/2019   Procedure: CYSTOSCOPY WITH RETROGRADE PYELOGRAM;  Surgeon: Abbie Sons, MD;  Location: [facility];  Service: Urology;  Laterality: Right;  . CYSTOSCOPY/URETEROSCOPY/HOLMIUM LASER/STENT PLACEMENT Left 02/02/2017   Procedure: CYSTOSCOPY/URETEROSCOPY/HOLMIUM LASER/STENT PLACEMENT;  Surgeon: Abbie Sons, MD;  Location: [facility];  Service: Urology;  Laterality: Left;  . CYSTOSCOPY/URETEROSCOPY/HOLMIUM LASER/STENT PLACEMENT Right 06/26/2019   Procedure: CYSTOSCOPY/URETEROSCOPY/HOLMIUM LASER/STENT PLACEMENT;  Surgeon: Abbie Sons,  MD;  Location: [facility];  Service: Urology;  Laterality: Right;  . DILITATION & CURRETTAGE/HYSTROSCOPY WITH ESSURE    . DILITATION & CURRETTAGE/HYSTROSCOPY WITH NOVASURE ABLATION N/A 12/24/2018   Procedure: DILATATION & CURETTAGE/HYSTEROSCOPY WITH ENDOMETRIAL ABLATION (MINERVA SYSTEM);  Surgeon: Will Bonnet, MD;  Location: [facility];  Service: Gynecology;  Laterality: N/A;  . INTRAUTERINE DEVICE (IUD) INSERTION    . LITHOTRIPSY    . TONSILLECTOMY    . TONSILLECTOMY AND ADENOIDECTOMY    . TUBAL LIGATION      Home Medications:  Allergies as of 07/02/2019      Reactions   Penicillins Rash   Has patient had a PCN reaction causing immediate rash, facial/tongue/throat swelling, SOB or lightheadedness with hypotension: Yes Has patient had a PCN reaction causing severe rash involving mucus membranes or skin necrosis: Yes Has patient had a PCN reaction that required hospitalization: No Has patient had a PCN reaction occurring within the last 10 years: Yes If all of the above answers are "NO", then may proceed with Cephalosporin use.      Medication List       Accurate as of Jul 01, 2019 11:20 PM. If you have any questions, ask your nurse or doctor.        albuterol 108 (90 Base) MCG/ACT inhaler Commonly known as: VENTOLIN HFA Inhale 2 puffs into the lungs every 6 (six) hours as needed for wheezing or shortness of breath.   ondansetron 4 MG tablet Commonly known as: Zofran Take 1 tablet (4 mg total) by mouth daily as needed for nausea  or vomiting.   oxybutynin 5 MG tablet Commonly known as: DITROPAN 1 tab tid prn frequency,urgency, bladder spasm   oxyCODONE-acetaminophen 5-325 MG tablet Commonly known as: Percocet Take 1 tablet by mouth every 4 (four) hours as needed for severe pain.   tamsulosin 0.4 MG Caps capsule Commonly known as: FLOMAX Take 1 capsule (0.4 mg total) by mouth daily.   traZODone 50 MG tablet Commonly known as: DESYREL Take 0.5-1 tablets (25-50 mg  total) by mouth at bedtime as needed for sleep (and anxiety). What changed: how much to take       Allergies:  Allergies  Allergen Reactions  . Penicillins Rash    Has patient had a PCN reaction causing immediate rash, facial/tongue/throat swelling, SOB or lightheadedness with hypotension: Yes Has patient had a PCN reaction causing severe rash involving mucus membranes or skin necrosis: Yes Has patient had a PCN reaction that required hospitalization: No Has patient had a PCN reaction occurring within the last 10 years: Yes If all of the above answers are "NO", then may proceed with Cephalosporin use.     Family History: Family History  Problem Relation Age of Onset  . Cancer Mother        breast  . Diabetes Father   . Hypertension Father   . Heart disease Maternal Grandfather   . Dementia Paternal Grandmother   . Bladder Cancer Neg Hx   . Kidney cancer Neg Hx     Social History:  reports that she has never smoked. She has never used smokeless tobacco. She reports current alcohol use. She reports that she does not use drugs.   Physical Exam: There were no vitals taken for this visit.  Constitutional:  Alert and oriented, No acute distress. HEENT: [place] AT, moist mucus membranes.  Trachea midline, no masses. Cardiovascular: No clubbing, cyanosis, or edema. Respiratory: Normal respiratory effort, no increased work of breathing. Skin: No rashes, bruises or suspicious lesions. Neurologic: Grossly intact, no focal deficits, moving all 4 extremities. Psychiatric: Normal mood and affect.  Urinalysis Microhematuria, no pyuria   Assessment & Plan:    1.  Status post ureteroscopic stone removal -Symptoms are most likely secondary to residual ureteral edema. - She will continue tamsulosin -She is given 31m of myrbetriq samples daily - She is instructed to call if her urinary symptoms do not improve in 1 week, will schedule a follow up CT scan.  Call earlier for worsening  pain, fever  [address] [place] [phone] I, KJoneen BoersPeace, am acting as a sEducation administratorfor Dr. SNicki ReaperC. Latavius Capizzi.  I have reviewed the above documentation for accuracy and completeness, and I agree with the above.   SAbbie Sons MD

## 2019-07-03 LAB — URINALYSIS, COMPLETE
Bilirubin, UA: NEGATIVE
Glucose, UA: NEGATIVE
Ketones, UA: NEGATIVE
Leukocytes,UA: NEGATIVE
Nitrite, UA: NEGATIVE
Specific Gravity, UA: 1.025 (ref 1.005–1.030)
Urobilinogen, Ur: 0.2 mg/dL (ref 0.2–1.0)
pH, UA: 5.5 (ref 5.0–7.5)

## 2019-07-03 LAB — MICROSCOPIC EXAMINATION: RBC, Urine: >30 /hpf — AB (ref 0–2)

## 2019-07-23 ENCOUNTER — Encounter: Payer: Self-pay | Admitting: *Deleted

## 2019-07-23 ENCOUNTER — Ambulatory Visit: Payer: BC Managed Care – PPO | Admitting: Urology

## 2019-07-23 ENCOUNTER — Encounter: Payer: Self-pay | Admitting: Urology

## 2019-07-23 ENCOUNTER — Other Ambulatory Visit: Payer: Self-pay

## 2019-07-23 ENCOUNTER — Telehealth (INDEPENDENT_AMBULATORY_CARE_PROVIDER_SITE_OTHER): Payer: BC Managed Care – PPO | Admitting: Urology

## 2019-07-23 DIAGNOSIS — N200 Calculus of kidney: Secondary | ICD-10-CM

## 2019-07-23 NOTE — Progress Notes (Signed)
Virtual Visit via Telephone Note  I connected with Diana Sexton on 07/23/19 at  8:00 AM EDT by telephone and verified that I am speaking with the correct person using two identifiers.  Location: Patient: Home Provider: [facility]   I discussed the limitations, risks, security and privacy concerns of performing an evaluation and management service by telephone and the availability of in person appointments. I also discussed with the patient that there may be a patient responsible charge related to this service. The patient expressed understanding and agreed to proceed.   History of Present Illness: 69 female who underwent ureteroscopic removal of a mid ureteral calculus 06/26/2019.  Stent was inadvertently removed the day after the procedure and she required an ED visit for renal colic.  I saw her on 5/19 and her pain had resolved but she was complaining of urinary frequency and urgency.  She was continued on tamsulosin given samples of Myrbetriq and states symptoms are significantly improved and minimal.   Observations/Objective: N/A  Assessment and Plan: Doing well status post ureteroscopic stone removal  Follow Up Instructions: She has a recurrent stone former and have recommended a metabolic evaluation I have [place].  She will be notified with results.   I discussed the assessment and treatment plan with the patient. The patient was provided an opportunity to ask questions and all were answered. The patient agreed with the plan and demonstrated an understanding of the instructions.   The patient was advised to call back or seek an in-person evaluation if the symptoms worsen or if the condition fails to improve as anticipated.  I provided 11 minutes of non-face-to-face time during this encounter.   Abbie Sons, MD

## 2019-10-06 ENCOUNTER — Ambulatory Visit: Admit: 2019-10-06 | Discharge status: Against Medical Advice | Payer: BC Managed Care – PPO

## 2019-10-06 DIAGNOSIS — M7662 Achilles tendinitis, left leg: Secondary | ICD-10-CM | POA: Diagnosis not present

## 2019-12-16 ENCOUNTER — Emergency Department
Admission: EM | Admit: 2019-12-16 | Discharge: 2019-12-16 | Disposition: A | Discharge status: Home / Self Care | Payer: BC Managed Care – PPO | Attending: Emergency Medicine | Admitting: Emergency Medicine

## 2019-12-16 ENCOUNTER — Other Ambulatory Visit: Payer: Self-pay

## 2019-12-16 ENCOUNTER — Emergency Department: Payer: BC Managed Care – PPO

## 2019-12-16 DIAGNOSIS — R109 Unspecified abdominal pain: Secondary | ICD-10-CM

## 2019-12-16 DIAGNOSIS — K439 Ventral hernia without obstruction or gangrene: Secondary | ICD-10-CM | POA: Diagnosis not present

## 2019-12-16 DIAGNOSIS — K429 Umbilical hernia without obstruction or gangrene: Secondary | ICD-10-CM | POA: Diagnosis not present

## 2019-12-16 DIAGNOSIS — Z880 Allergy status to penicillin: Secondary | ICD-10-CM | POA: Insufficient documentation

## 2019-12-16 DIAGNOSIS — K469 Unspecified abdominal hernia without obstruction or gangrene: Secondary | ICD-10-CM | POA: Diagnosis not present

## 2019-12-16 DIAGNOSIS — N202 Calculus of kidney with calculus of ureter: Secondary | ICD-10-CM | POA: Diagnosis not present

## 2019-12-16 DIAGNOSIS — N200 Calculus of kidney: Secondary | ICD-10-CM

## 2019-12-16 DIAGNOSIS — Z87442 Personal history of urinary calculi: Secondary | ICD-10-CM | POA: Diagnosis not present

## 2019-12-16 LAB — COMPREHENSIVE METABOLIC PANEL
ALT: 21 U/L (ref 0–44)
AST: 19 U/L (ref 15–41)
Albumin: 3.5 g/dL (ref 3.5–5.0)
Alkaline Phosphatase: 69 U/L (ref 38–126)
Anion gap: 8 (ref 5–15)
BUN: 11 mg/dL (ref 6–20)
CO2: 26 mmol/L (ref 22–32)
Calcium: 8.8 mg/dL — ABNORMAL LOW (ref 8.9–10.3)
Chloride: 102 mmol/L (ref 98–111)
Creatinine, Ser: 0.56 mg/dL (ref 0.44–1.00)
GFR, Estimated: >60 mL/min (ref >60)
Glucose, Bld: 116 mg/dL — ABNORMAL HIGH (ref 70–99)
Potassium: 3.9 mmol/L (ref 3.5–5.1)
Sodium: 136 mmol/L (ref 135–145)
Total Bilirubin: 0.6 mg/dL (ref 0.3–1.2)
Total Protein: 7.6 g/dL (ref 6.5–8.1)

## 2019-12-16 LAB — URINALYSIS, COMPLETE (UACMP) WITH MICROSCOPIC
Bacteria, UA: NONE SEEN
Bilirubin Urine: NEGATIVE
Glucose, UA: NEGATIVE mg/dL
Hgb urine dipstick: NEGATIVE
Ketones, ur: NEGATIVE mg/dL
Leukocytes,Ua: NEGATIVE
Nitrite: NEGATIVE
Protein, ur: 30 mg/dL — AB
Specific Gravity, Urine: 1.024 (ref 1.005–1.030)
pH: 5 (ref 5.0–8.0)

## 2019-12-16 LAB — CBC
HCT: 39.3 % (ref 36.0–46.0)
Hemoglobin: 12.5 g/dL (ref 12.0–15.0)
MCH: 24.3 pg — ABNORMAL LOW (ref 26.0–34.0)
MCHC: 31.8 g/dL (ref 30.0–36.0)
MCV: 76.5 fL — ABNORMAL LOW (ref 80.0–100.0)
Platelets: 383 10*3/uL (ref 150–400)
RBC: 5.14 MIL/uL — ABNORMAL HIGH (ref 3.87–5.11)
RDW: 16 % — ABNORMAL HIGH (ref 11.5–15.5)
WBC: 9.4 10*3/uL (ref 4.0–10.5)
nRBC: 0 % (ref 0.0–0.2)

## 2019-12-16 MED ORDER — KETOROLAC TROMETHAMINE 30 MG/ML IJ SOLN
30.0000 mg | Freq: Once | INTRAMUSCULAR | Status: AC
  Administered 2019-12-16: 30 mg via INTRAMUSCULAR
  Filled 2019-12-16: qty 1

## 2019-12-16 NOTE — ED Provider Notes (Signed)
[facility] Emergency Department Provider Note ____________________________________________   First MD Initiated Contact with Patient 12/16/19 [phone]     (approximate)  I have reviewed the triage vital signs and the nursing notes.  HISTORY  Chief Complaint Flank Pain   HPI Diana Sexton is a 34 y.o. femalewho presents to the ED for evaluation of left-sided flank and abdominal pain.  Chart review indicates history of ureterolithiasis, follows with urology.  History of obesity.  History of unprovoked DVT in 2013  Patient presents to the ED with left-sided abdominal and flank pain has been constant since midnight.  She reports that the sensation feels different than her typical ureterolithiasis pain.  She indicates a constant and "deep or gnawing" sensation, that is 4/10 intensity and unremitting.  Reports taking Tylenol at home without relief of symptoms.  She denies any associated symptoms with this pain.  Denies nausea, vomiting, diarrhea, dysuria, vaginal discharge or bleeding, shortness of breath, chest pain, pleuritic pain or syncope.    Past Medical History:  Diagnosis Date  . Anemia   . Anxiety   . Cellulitis 2012   caused stillbirth  . DVT (deep venous thrombosis) ([place]) 06/2011   under right clavicle  . Dysmenorrhea   . Dyspnea   . Family history of breast cancer 11/2013   BRCA/MyRisk neg  . H/O blood clots   . Headache    related to hormonal meds  . History of kidney stones   . History of prothrombin mutation   . Increased risk of breast cancer 11/2013   IBIS=27%  . Obesity     Patient Active Problem List   Diagnosis Date Noted  . Sprain of left knee 04/14/2019  . DVT (deep venous thrombosis) ([place]) 02/09/2018  . Menorrhagia with irregular cycle 10/13/2016  . Other irritable bowel syndrome 05/02/2016  . Urinary tract infection 01/21/2015  . Anxiety and depression 01/21/2015  . Nephrolithiasis 01/15/2015  . Abnormal radiologic  findings on diagnostic imaging of renal pelvis, ureter, or bladder 01/15/2015  . Low HDL (under 40) 01/01/2015  . Elevated LDL cholesterol level 01/01/2015  . Insomnia 12/16/2014  . Morbid obesity ([place]) 12/11/2014  . Hematuria 12/11/2014  . Preventative health care 12/11/2014  . Low back pain 10/13/2014  . Prothrombin gene mutation ([place]) 10/13/2014  . Hydronephrosis 06/07/2012  . Incomplete emptying of bladder 06/07/2012  . Renal colic 96/05/5407  . Ureteric stone 06/07/2012    Past Surgical History:  Procedure Laterality Date  . CHOLECYSTECTOMY    . CYSTOSCOPY W/ RETROGRADES Left 02/02/2017   Procedure: CYSTOSCOPY WITH RETROGRADE PYELOGRAM;  Surgeon: Abbie Sons, MD;  Location: [facility] ORS;  Service: Urology;  Laterality: Left;  . CYSTOSCOPY W/ RETROGRADES Right 06/26/2019   Procedure: CYSTOSCOPY WITH RETROGRADE PYELOGRAM;  Surgeon: Abbie Sons, MD;  Location: [facility] OR[facility];  Service: Urology;  Laterality: Right;  . CYSTOSCOPY/URETEROSCOPY/HOLMIUM LASER/STENT PLACEMENT Left 02/02/2017   Procedure: CYSTOSCOPY/URETEROSCOPY/HOLMIUM LASER/STENT PLACEMENT;  Surgeon: Abbie Sons, MD;  Location: [facility] OR[facility];  Service: Urology;  Laterality: Left;  . CYSTOSCOPY/URETEROSCOPY/HOLMIUM LASER/STENT PLACEMENT Right 06/26/2019   Procedure: CYSTOSCOPY/URETEROSCOPY/HOLMIUM LASER/STENT PLACEMENT;  Surgeon: Abbie Sons, MD;  Location: [facility] OR[facility];  Service: Urology;  Laterality: Right;  . DILITATION & CURRETTAGE/HYSTROSCOPY WITH ESSURE    . DILITATION & CURRETTAGE/HYSTROSCOPY WITH NOVASURE ABLATION N/A 12/24/2018   Procedure: DILATATION & CURETTAGE/HYSTEROSCOPY WITH ENDOMETRIAL ABLATION (MINERVA SYSTEM);  Surgeon: Will Bonnet, MD;  Location: [facility] OR[facility];  Service: Gynecology;  Laterality: N/A;  . INTRAUTERINE DEVICE (IUD)  INSERTION    . LITHOTRIPSY    . TONSILLECTOMY    . TONSILLECTOMY AND ADENOIDECTOMY    . TUBAL LIGATION      Prior to Admission medications   Medication Sig Start Date End  Date Taking? Authorizing Provider  albuterol (VENTOLIN HFA) 108 (90 Base) MCG/ACT inhaler Inhale 2 puffs into the lungs every 6 (six) hours as needed for wheezing or shortness of breath. 12/02/18   Lavonia Drafts, MD  ondansetron (ZOFRAN) 4 MG tablet Take 1 tablet (4 mg total) by mouth daily as needed for nausea or vomiting. 06/23/19   Harvest Dark, MD  oxybutynin (DITROPAN) 5 MG tablet 1 tab tid prn frequency,urgency, bladder spasm 06/26/19   Stoioff, Ronda Fairly, MD  oxyCODONE-acetaminophen (PERCOCET) 5-325 MG tablet Take 1 tablet by mouth every 4 (four) hours as needed for severe pain. 06/23/19   Harvest Dark, MD  tamsulosin (FLOMAX) 0.4 MG CAPS capsule Take 1 capsule (0.4 mg total) by mouth daily. 06/23/19   Harvest Dark, MD  traZODone (DESYREL) 50 MG tablet Take 0.5-1 tablets (25-50 mg total) by mouth at bedtime as needed for sleep (and anxiety). Patient taking differently: Take 25 mg by mouth at bedtime as needed for sleep (and anxiety).  04/14/19   Eulogio Bear, NP    Allergies Penicillins  Family History  Problem Relation Age of Onset  . Cancer Mother        breast  . Diabetes Father   . Hypertension Father   . Heart disease Maternal Grandfather   . Dementia Paternal Grandmother   . Bladder Cancer Neg Hx   . Kidney cancer Neg Hx     Social History Social History   Tobacco Use  . Smoking status: Never Smoker  . Smokeless tobacco: Never Used  Vaping Use  . Vaping Use: Never used  Substance Use Topics  . Alcohol use: Yes    Comment: 1 drink per month  . Drug use: No    Review of Systems  Constitutional: No fever/chills Eyes: No visual changes. ENT: No sore throat. Cardiovascular: Denies chest pain. Respiratory: Denies shortness of breath. Gastrointestinal: Positive for abdominal pain.  No nausea, no vomiting.  No diarrhea.  No constipation. Genitourinary: Negative for dysuria. Musculoskeletal: Negative for back pain. Skin: Negative for  rash. Neurological: Negative for headaches, focal weakness or numbness.  ____________________________________________   PHYSICAL EXAM:  VITAL SIGNS: Vitals:   12/16/19 0740 12/16/19 0745  BP: 132/65   Pulse:  72  Resp:    Temp:    SpO2:  98%     Constitutional: Alert and oriented. Well appearing and in no acute distress.  Conversational in full sentences without distress. Eyes: Conjunctivae are normal. PERRL. EOMI. Head: Atraumatic. Nose: No congestion/rhinnorhea. Mouth/Throat: Mucous membranes are moist.  Oropharynx non-erythematous. Neck: No stridor. No cervical spine tenderness to palpation. Cardiovascular: Normal rate, regular rhythm. Grossly normal heart sounds.  Good peripheral circulation. Respiratory: Normal respiratory effort.  No retractions. Lungs CTAB. Gastrointestinal: Soft , nondistended. No abdominal bruits. No CVA tenderness. Mild and diffuse left-sided and left flank tenderness without localizing peritoneal features.  Upper and right-sided abdomen is benign.  No CVA tenderness bilaterally. Musculoskeletal: No lower extremity tenderness nor edema.  No joint effusions. No signs of acute trauma. Neurologic:  Normal speech and language. No gross focal neurologic deficits are appreciated. No gait instability noted. Skin:  Skin is warm, dry and intact. No rash noted. Psychiatric: Mood and affect are normal. Speech and behavior are normal.  ____________________________________________  LABS (all labs ordered are listed, but only abnormal results are displayed)  Labs Reviewed  CBC - Abnormal; Notable for the following components:      Result Value   RBC 5.14 (*)    MCV 76.5 (*)    MCH 24.3 (*)    RDW 16.0 (*)    All other components within normal limits  COMPREHENSIVE METABOLIC PANEL - Abnormal; Notable for the following components:   Glucose, Bld 116 (*)    Calcium 8.8 (*)    All other components within normal limits  URINALYSIS, COMPLETE (UACMP) WITH  MICROSCOPIC - Abnormal; Notable for the following components:   Color, Urine YELLOW (*)    APPearance HAZY (*)    Protein, ur 30 (*)    All other components within normal limits    ____________________________________________  RADIOLOGY  ED MD interpretation: CT renal study reviewed by me with bilateral nephrolithiasis, no evidence of ureterolithiasis.  She does have a small left paramedian ventral hernia  Official radiology report(s): CT Renal Stone Study  Result Date: 12/16/2019 CLINICAL DATA:  Flank pain, evaluate for kidney stone EXAM: CT ABDOMEN AND PELVIS WITHOUT CONTRAST TECHNIQUE: Multidetector CT imaging of the abdomen and pelvis was performed following the standard protocol without IV contrast. COMPARISON:  06/23/2019. FINDINGS: Lower chest: No acute abnormality. Hepatobiliary: No focal liver abnormality. Previous cholecystectomy. No biliary ductal dilatation. Pancreas: Unremarkable. No pancreatic ductal dilatation or surrounding inflammatory changes. Spleen: Normal in size without focal abnormality. Adrenals/Urinary Tract: Normal appearance of the adrenal glands. Bilateral nephrolithiasis. The largest right kidney stone is in the mid to lower pole collecting system measuring 4 mm, image 85/5. The largest left kidney stone is in the inferior pole measuring 6 mm, image 91/5. No hydronephrosis identified bilaterally. No hydroureter or ureteral lithiasis identified. No bladder calcifications identified. Stomach/Bowel: Stomach appears normal. The appendix is visualized and appears within normal limits. No bowel wall thickening, inflammation or dilatation. Vascular/Lymphatic: No significant vascular findings are present. No enlarged abdominal or pelvic lymph nodes. Reproductive: Uterus and bilateral adnexa are unremarkable. Other: At the level of the umbilicus there is a left paramidline ventral abdominal wall hernia contains fat and a knuckle of nondilated small bowel, image 59/2. there is no  free fluid or fluid collections identified within the abdomen or pelvis. Musculoskeletal: No acute or significant osseous findings. IMPRESSION: 1. No acute findings within the abdomen or pelvis. 2. Bilateral nephrolithiasis. No hydronephrosis or hydroureter. No ureteral lithiasis identified. 3. Left paramidline ventral abdominal wall hernia containing fat and a knuckle of nondilated small bowel. Electronically Signed   By: Kerby Moors M.D.   On: 12/16/2019 05:52    ____________________________________________   PROCEDURES and INTERVENTIONS  Procedure(s) performed (including Critical Care):  Procedures  Medications  ketorolac (TORADOL) 30 MG/ML injection 30 mg (30 mg Intramuscular Given 12/16/19 0745)   ____________________________________________   MDM / ED COURSE  34 year old woman with history of ureterolithiasis and unprovoked DVT presents to the ED with acute abdominal pain, most consistent with a ventral hernia, and amenable to outpatient management with general surgery follow-up.  Normal vital signs on room air.  Exam is reassuring with mild and vague left-sided abdominal tenderness without peritoneal features.  She otherwise looks well.  Blood work is reassuring without evidence of acute pathology.  While she has a history of unprovoked DVT, and these symptoms could represent acute PE, she has no tachycardia, pleuritic or respiratory symptoms, and the CT demonstrating a ventral hernia is more likely the source of  her symptoms.  We discussed this possibility, and she is comfortable without further imaging to rule out acute PE, which I think is reasonable.  She has no evidence of obstruction, or incarcerated hernia to require emergent surgical evaluation, so provide her with information for outpatient general surgery follow-up.  We discussed return precautions for the ED that may represent incarceration or obstruction.  Patient medically stable for discharge  home. ____________________________________________   FINAL CLINICAL IMPRESSION(S) / ED DIAGNOSES  Final diagnoses:  Ventral hernia without obstruction or gangrene  Nephrolith  Left flank pain     ED Discharge Orders    None       Iliyana Convey   Note:  This document was prepared using Dragon voice recognition software and may include unintentional dictation errors.   Vladimir Crofts, MD 12/16/19 [id number]

## 2019-12-16 NOTE — Discharge Instructions (Signed)
As we discussed, you have a small ventral hernia that is likely causing your pain.  Please follow-up with the general surgeon, I have attached their phone number to call the clinic to set up this follow-up appointment.  Please take Tylenol and ibuprofen/Advil for your pain.  It is safe to take them together, or to alternate them every few hours.  Take up to 1000mg  of Tylenol at a time, up to 4 times per day.  Do not take more than 4000 mg of Tylenol in 24 hours.  For ibuprofen, take 400-600 mg, 4-5 times per day.   If you develop any significantly worsening pain not improved with the above medications, fevers with your pain, please return to the ED.

## 2019-12-16 NOTE — ED Triage Notes (Signed)
Pt started having left flank pain since midnight, states has worsened since. Pt has hx of kidney stones but states feels different.

## 2019-12-22 ENCOUNTER — Encounter: Payer: Self-pay | Admitting: Surgery

## 2019-12-22 ENCOUNTER — Telehealth: Payer: Self-pay

## 2019-12-22 ENCOUNTER — Ambulatory Visit: Payer: BC Managed Care – PPO | Admitting: Surgery

## 2019-12-22 ENCOUNTER — Telehealth: Payer: Self-pay | Admitting: Surgery

## 2019-12-22 ENCOUNTER — Other Ambulatory Visit: Payer: Self-pay

## 2019-12-22 VITALS — BP 146/80 | HR 79 | Temp 98.2°F | Resp 12 | Ht 65.0 in | Wt 315.0 lb

## 2019-12-22 DIAGNOSIS — K439 Ventral hernia without obstruction or gangrene: Secondary | ICD-10-CM | POA: Diagnosis not present

## 2019-12-22 NOTE — Telephone Encounter (Signed)
Patient has been advised of Pre-Admission date/time, COVID Testing date and Surgery date.  Surgery Date: 01/06/20 Preadmission Testing Date: 12/30/19 (phone 8a-1p) Covid Testing Date: 01/05/20 - patient advised to go to the [facility] ([place]) between 8a-10:00 am    Patient has been made aware to call [phone], between 1-3:00pm the day before surgery, to find out what time to arrive for surgery.

## 2019-12-22 NOTE — Patient Instructions (Addendum)
We will send Clearance to Dr.Timothy Fennigan. Our surgery scheduler will call to schedule your surgery within 24-48 hours. Please have the [facility] surgery sheet available when speaking with her.    Ventral Hernia  A ventral hernia is a bulge of tissue from inside the abdomen that pushes through a weak area of the muscles that form the front wall of the abdomen. The tissues inside the abdomen are inside a sac (peritoneum). These tissues include the small intestine, large intestine, and the fatty tissue that covers the intestines (omentum). Sometimes, the bulge that forms a hernia contains intestines. Other hernias contain only fat. Ventral hernias do not go away without surgical treatment. There are several types of ventral hernias. You may have:  A hernia at an incision site from previous abdominal surgery (incisional hernia).  A hernia just above the belly button (epigastric hernia), or at the belly button (umbilical hernia). These types of hernias can develop from heavy lifting or straining.  A hernia that comes and goes (reducible hernia). It may be visible only when you lift or strain. This type of hernia can be pushed back into the abdomen (reduced).  A hernia that traps abdominal tissue inside the hernia (incarcerated hernia). This type of hernia does not reduce.  A hernia that cuts off blood flow to the tissues inside the hernia (strangulated hernia). The tissues can start to die if this happens. This is a very painful bulge that cannot be reduced. A strangulated hernia is a medical emergency. What are the causes? This condition is caused by abdominal tissue putting pressure on an area of weakness in the abdominal muscles. What increases the risk? The following factors may make you more likely to develop this condition:  Being female.  Being 34 or older.  Being overweight or obese.  Having had previous abdominal surgery, especially if there was an infection after surgery.  Having had  an injury to the abdominal wall.  Having had several pregnancies.  Having a buildup of fluid inside the abdomen (ascites). What are the signs or symptoms? The only symptom of a ventral hernia may be a painless bulge in the abdomen. A reducible hernia may be visible only when you strain, cough, or lift. Other symptoms may include:  Dull pain.  A feeling of pressure. Signs and symptoms of a strangulated hernia may include:  Increasing pain.  Nausea and vomiting.  Pain when pressing on the hernia.  The skin over the hernia turning red or purple.  Constipation.  Blood in the stool (feces). How is this diagnosed? This condition may be diagnosed based on:  Your symptoms.  Your medical history.  A physical exam. You may be asked to cough or strain while standing. These actions increase the pressure inside your abdomen and force the hernia through the opening in your muscles. Your health care provider may try to reduce the hernia by pressing on it.  Imaging studies, such as an ultrasound or CT scan. How is this treated? This condition is treated with surgery. If you have a strangulated hernia, surgery is done as soon as possible. If your hernia is small and not incarcerated, you may be asked to lose some weight before surgery. Follow these instructions at home:  Follow instructions from your health care provider about eating or drinking restrictions.  If you are overweight, your health care provider may recommend that you increase your activity level and eat a healthier diet.  Do not lift anything that is heavier than 10  lb (4.5 kg).  Return to your normal activities as told by your health care provider. Ask your health care provider what activities are safe for you. You may need to avoid activities that increase pressure on your hernia.  Take over-the-counter and prescription medicines only as told by your health care provider.  Keep all follow-up visits as told by your  health care provider. This is important. Contact a health care provider if:  Your hernia gets larger.  Your hernia becomes painful. Get help right away if:  Your hernia becomes increasingly painful.  You have pain along with any of the following: ? Changes in skin color in the area of the hernia. ? Nausea. ? Vomiting. ? Fever. Summary  A ventral hernia is a bulge of tissue from inside the abdomen that pushes through a weak area of the muscles that form the front wall of the abdomen.  This condition is treated with surgery, which may be urgent depending on your hernia.  Do not lift anything that is heavier than 10 lb (4.5 kg), and follow activity instructions from your health care provider. This information is not intended to replace advice given to you by your health care provider. Make sure you discuss any questions you have with your health care provider. Document Revised: 03/14/2017 Document Reviewed: 08/21/2016 Elsevier Patient Education  [place].

## 2019-12-22 NOTE — Telephone Encounter (Signed)
Anticoagulant Clearance faxed to Dr.Timothy Finnegan today.

## 2019-12-22 NOTE — H&P (View-Only) (Signed)
12/22/2019  Reason for Visit:  Ventral hernia  History of Present Illness: Diana Sexton is a 34 y.o. female presenting for evaluation of a ventral hernia.  The patient presented to the emergency department on 12/16/2019 with left-sided abdominal pain.  The patient does have a history of kidney stones and has had 2 procedures with Dr. Bernardo Heater in the past.  However patient reports that her pain is not the same as with kidney stones in the past.  She reports that her pain is more "inside" on her left side compared to kidney stone pain.  She had a CT scan of the abdomen pelvis which showed bilateral nephrolithiasis but without any complications and also a left paramidline ventral abdominal wall hernia which contains fat but also a knuckle of small bowel right the edge of the defect.  Patient reports that she has been eating well but at times feeling somewhat constipated.  Denies any nausea, vomiting.  Denies any right-sided abdominal pain.  The pain is mostly on the left side and left flank.  Denies any fevers or chills, chest pain or shortness of breath.  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  She had been on Eliquis but stopped last year.  She had been followed by Dr. [name].  She also has a history of laparoscopic cholecystectomy and a laparoscopic tubal ligation.  Past Medical History: Past Medical History:  Diagnosis Date  . Anemia   . Anxiety   . Cellulitis 2012   caused stillbirth  . DVT (deep venous thrombosis) ([place]) 06/2011   under right clavicle  . Dysmenorrhea   . Dyspnea   . Family history of breast cancer 11/2013   BRCA/MyRisk neg  . H/O blood clots   . Headache    related to hormonal meds  . History of kidney stones   . History of prothrombin mutation   . Increased risk of breast cancer 11/2013   IBIS=27%  . Obesity      Past Surgical History: Past Surgical History:  Procedure Laterality Date  .  CHOLECYSTECTOMY  2007  . CYSTOSCOPY W/ RETROGRADES Left 02/02/2017   Procedure: CYSTOSCOPY WITH RETROGRADE PYELOGRAM;  Surgeon: Abbie Sons, MD;  Location: [facility];  Service: Urology;  Laterality: Left;  . CYSTOSCOPY W/ RETROGRADES Right 06/26/2019   Procedure: CYSTOSCOPY WITH RETROGRADE PYELOGRAM;  Surgeon: Abbie Sons, MD;  Location: [facility];  Service: Urology;  Laterality: Right;  . CYSTOSCOPY/URETEROSCOPY/HOLMIUM LASER/STENT PLACEMENT Left 02/02/2017   Procedure: CYSTOSCOPY/URETEROSCOPY/HOLMIUM LASER/STENT PLACEMENT;  Surgeon: Abbie Sons, MD;  Location: [facility];  Service: Urology;  Laterality: Left;  . CYSTOSCOPY/URETEROSCOPY/HOLMIUM LASER/STENT PLACEMENT Right 06/26/2019   Procedure: CYSTOSCOPY/URETEROSCOPY/HOLMIUM LASER/STENT PLACEMENT;  Surgeon: Abbie Sons, MD;  Location: [facility];  Service: Urology;  Laterality: Right;  . DILITATION & CURRETTAGE/HYSTROSCOPY WITH ESSURE    . DILITATION & CURRETTAGE/HYSTROSCOPY WITH NOVASURE ABLATION N/A 12/24/2018   Procedure: DILATATION & CURETTAGE/HYSTEROSCOPY WITH ENDOMETRIAL ABLATION (MINERVA SYSTEM);  Surgeon: Will Bonnet, MD;  Location: [facility];  Service: Gynecology;  Laterality: N/A;  . INTRAUTERINE DEVICE (IUD) INSERTION    . LITHOTRIPSY    . TONSILLECTOMY    . TONSILLECTOMY AND ADENOIDECTOMY    . TUBAL LIGATION  2014    Home Medications: Prior to Admission medications   Not on File    Allergies: Allergies  Allergen Reactions  . Penicillins Rash    Has patient had a PCN reaction  causing immediate rash, facial/tongue/throat swelling, SOB or lightheadedness with hypotension: Yes Has patient had a PCN reaction causing severe rash involving mucus membranes or skin necrosis: Yes Has patient had a PCN reaction that required hospitalization: No Has patient had a PCN reaction occurring within the last 10 years: Yes If all of the above answers are "NO", then may proceed with Cephalosporin use.     Social  History:  reports that she has never smoked. She has never used smokeless tobacco. She reports current alcohol use. She reports that she does not use drugs.   Family History: Family History  Problem Relation Age of Onset  . Cancer Mother        breast  . Diabetes Father   . Hypertension Father   . Heart disease Maternal Grandfather   . Dementia Paternal Grandmother   . Bladder Cancer Neg Hx   . Kidney cancer Neg Hx     Review of Systems: Review of Systems  Constitutional: Negative for chills and fever.  HENT: Negative for hearing loss.   Respiratory: Negative for shortness of breath.   Cardiovascular: Negative for chest pain.  Gastrointestinal: Positive for abdominal pain and constipation. Negative for diarrhea, nausea and vomiting.  Genitourinary: Negative for dysuria.  Musculoskeletal: Negative for myalgias.  Skin: Negative for rash.  Neurological: Negative for dizziness.  Psychiatric/Behavioral: Negative for depression.    Physical Exam BP (!) 146/80   Pulse 79   Temp 98.2 F (36.8 C) (Oral)   Resp 12   Ht 5' 5"  (1.651 m)   Wt (!) 315 lb (142.9 kg)   SpO2 97%   BMI 52.42 kg/m  CONSTITUTIONAL: No acute distress HEENT:  Normocephalic, atraumatic, extraocular motion intact. NECK: Trachea is midline, and there is no jugular venous distension.  RESPIRATORY:  Normal respiratory effort without pathologic use of accessory muscles. CARDIOVASCULAR: Regular rhythm and rate. GI: The abdomen is soft, obese, nondistended, with tenderness to palpation just to the left of midline near the umbilicus.  Patient has a very small umbilical hernia as well as a left paramedian hernia next to the umbilicus.  It is in the left area that the patient is tender to palpation.  No peritonitis.  Hernia is reducible.   MUSCULOSKELETAL:  Normal muscle strength and tone in all four extremities.  No peripheral edema or cyanosis. SKIN: Skin turgor is normal. There are no pathologic skin lesions.   NEUROLOGIC:  Motor and sensation is grossly normal.  Cranial nerves are grossly intact. PSYCH:  Alert and oriented to person, place and time. Affect is normal.  Laboratory Analysis: Labs from 12/16/2019: Sodium 136, potassium 3.9, chloride 102, CO2 26, BUN 11, creatinine 0.56.  LFTs within normal.  WBC 9.4, hemoglobin 12.5, hematocrit 39.3, platelet 383  Imaging: CT scan abdomen and pelvis on 12/16/2019: IMPRESSION: 1. No acute findings within the abdomen or pelvis. 2. Bilateral nephrolithiasis. No hydronephrosis or hydroureter. No ureteral lithiasis identified. 3. Left paramidline ventral abdominal wall hernia containing fat and a knuckle of nondilated small bowel.   Assessment and Plan: This is a 34 y.o. female with a symptomatic ventral hernia.  -Discussed with the patient that she indeed has 2 small hernia defects.  One of them is a very small defect at the umbilicus itself and the other one slightly bigger to the left of it.  Discussed with her that I think that is the source of her pain as she is somewhat tender to palpation during my exam.  Discussed with her that  unfortunately there is no medication or exercise that she could do to repair decrease the size of the hernia.  Unfortunately this can only grow with time if left alone.  Recommended based on her symptoms that we could proceed with a minimally invasive robotic ventral hernia repair.  Discussed with her the surgery at length and that we will close the defect and have a mesh as a second layer of repair.  Discussed with her the risks of bleeding, infection, injury to surrounding structures.  She is willing to proceed.  She also understands that she will need a COVID-19 test prior to surgery.  Also discussed with her postoperative restrictions including no heavy lifting or pushing nothing more than 10 to 15 pounds for period of 4 weeks. -We will schedule patient for 01/06/2020  Face-to-face time spent with the patient and care  providers was 45 minutes, with more than 50% of the time spent counseling, educating, and coordinating care of the patient.     Melvyn Neth, [facility]

## 2019-12-22 NOTE — Progress Notes (Signed)
12/22/2019  Reason for Visit:  Ventral hernia  History of Present Illness: Diana Sexton is a 34 y.o. female presenting for evaluation of a ventral hernia.  The patient presented to the emergency department on 12/16/2019 with left-sided abdominal pain.  The patient does have a history of kidney stones and has had 2 procedures with Dr. Bernardo Heater in the past.  However patient reports that her pain is not the same as with kidney stones in the past.  She reports that her pain is more "inside" on her left side compared to kidney stone pain.  She had a CT scan of the abdomen pelvis which showed bilateral nephrolithiasis but without any complications and also a left paramidline ventral abdominal wall hernia which contains fat but also a knuckle of small bowel right the edge of the defect.  Patient reports that she has been eating well but at times feeling somewhat constipated.  Denies any nausea, vomiting.  Denies any right-sided abdominal pain.  The pain is mostly on the left side and left flank.  Denies any fevers or chills, chest pain or shortness of breath.  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  She had been on Eliquis but stopped last year.  She had been followed by Dr. [name].  She also has a history of laparoscopic cholecystectomy and a laparoscopic tubal ligation.  Past Medical History: Past Medical History:  Diagnosis Date  . Anemia   . Anxiety   . Cellulitis 2012   caused stillbirth  . DVT (deep venous thrombosis) ([place]) 06/2011   under right clavicle  . Dysmenorrhea   . Dyspnea   . Family history of breast cancer 11/2013   BRCA/MyRisk neg  . H/O blood clots   . Headache    related to hormonal meds  . History of kidney stones   . History of prothrombin mutation   . Increased risk of breast cancer 11/2013   IBIS=27%  . Obesity      Past Surgical History: Past Surgical History:  Procedure Laterality Date  .  CHOLECYSTECTOMY  2007  . CYSTOSCOPY W/ RETROGRADES Left 02/02/2017   Procedure: CYSTOSCOPY WITH RETROGRADE PYELOGRAM;  Surgeon: Abbie Sons, MD;  Location: [facility];  Service: Urology;  Laterality: Left;  . CYSTOSCOPY W/ RETROGRADES Right 06/26/2019   Procedure: CYSTOSCOPY WITH RETROGRADE PYELOGRAM;  Surgeon: Abbie Sons, MD;  Location: [facility];  Service: Urology;  Laterality: Right;  . CYSTOSCOPY/URETEROSCOPY/HOLMIUM LASER/STENT PLACEMENT Left 02/02/2017   Procedure: CYSTOSCOPY/URETEROSCOPY/HOLMIUM LASER/STENT PLACEMENT;  Surgeon: Abbie Sons, MD;  Location: [facility];  Service: Urology;  Laterality: Left;  . CYSTOSCOPY/URETEROSCOPY/HOLMIUM LASER/STENT PLACEMENT Right 06/26/2019   Procedure: CYSTOSCOPY/URETEROSCOPY/HOLMIUM LASER/STENT PLACEMENT;  Surgeon: Abbie Sons, MD;  Location: [facility];  Service: Urology;  Laterality: Right;  . DILITATION & CURRETTAGE/HYSTROSCOPY WITH ESSURE    . DILITATION & CURRETTAGE/HYSTROSCOPY WITH NOVASURE ABLATION N/A 12/24/2018   Procedure: DILATATION & CURETTAGE/HYSTEROSCOPY WITH ENDOMETRIAL ABLATION (MINERVA SYSTEM);  Surgeon: Will Bonnet, MD;  Location: [facility];  Service: Gynecology;  Laterality: N/A;  . INTRAUTERINE DEVICE (IUD) INSERTION    . LITHOTRIPSY    . TONSILLECTOMY    . TONSILLECTOMY AND ADENOIDECTOMY    . TUBAL LIGATION  2014    Home Medications: Prior to Admission medications   Not on File    Allergies: Allergies  Allergen Reactions  . Penicillins Rash    Has patient had a PCN reaction  causing immediate rash, facial/tongue/throat swelling, SOB or lightheadedness with hypotension: Yes Has patient had a PCN reaction causing severe rash involving mucus membranes or skin necrosis: Yes Has patient had a PCN reaction that required hospitalization: No Has patient had a PCN reaction occurring within the last 10 years: Yes If all of the above answers are "NO", then may proceed with Cephalosporin use.     Social  History:  reports that she has never smoked. She has never used smokeless tobacco. She reports current alcohol use. She reports that she does not use drugs.   Family History: Family History  Problem Relation Age of Onset  . Cancer Mother        breast  . Diabetes Father   . Hypertension Father   . Heart disease Maternal Grandfather   . Dementia Paternal Grandmother   . Bladder Cancer Neg Hx   . Kidney cancer Neg Hx     Review of Systems: Review of Systems  Constitutional: Negative for chills and fever.  HENT: Negative for hearing loss.   Respiratory: Negative for shortness of breath.   Cardiovascular: Negative for chest pain.  Gastrointestinal: Positive for abdominal pain and constipation. Negative for diarrhea, nausea and vomiting.  Genitourinary: Negative for dysuria.  Musculoskeletal: Negative for myalgias.  Skin: Negative for rash.  Neurological: Negative for dizziness.  Psychiatric/Behavioral: Negative for depression.    Physical Exam BP (!) 146/80   Pulse 79   Temp 98.2 F (36.8 C) (Oral)   Resp 12   Ht 5' 5"  (1.651 m)   Wt (!) 315 lb (142.9 kg)   SpO2 97%   BMI 52.42 kg/m  CONSTITUTIONAL: No acute distress HEENT:  Normocephalic, atraumatic, extraocular motion intact. NECK: Trachea is midline, and there is no jugular venous distension.  RESPIRATORY:  Normal respiratory effort without pathologic use of accessory muscles. CARDIOVASCULAR: Regular rhythm and rate. GI: The abdomen is soft, obese, nondistended, with tenderness to palpation just to the left of midline near the umbilicus.  Patient has a very small umbilical hernia as well as a left paramedian hernia next to the umbilicus.  It is in the left area that the patient is tender to palpation.  No peritonitis.  Hernia is reducible.   MUSCULOSKELETAL:  Normal muscle strength and tone in all four extremities.  No peripheral edema or cyanosis. SKIN: Skin turgor is normal. There are no pathologic skin lesions.   NEUROLOGIC:  Motor and sensation is grossly normal.  Cranial nerves are grossly intact. PSYCH:  Alert and oriented to person, place and time. Affect is normal.  Laboratory Analysis: Labs from 12/16/2019: Sodium 136, potassium 3.9, chloride 102, CO2 26, BUN 11, creatinine 0.56.  LFTs within normal.  WBC 9.4, hemoglobin 12.5, hematocrit 39.3, platelet 383  Imaging: CT scan abdomen and pelvis on 12/16/2019: IMPRESSION: 1. No acute findings within the abdomen or pelvis. 2. Bilateral nephrolithiasis. No hydronephrosis or hydroureter. No ureteral lithiasis identified. 3. Left paramidline ventral abdominal wall hernia containing fat and a knuckle of nondilated small bowel.   Assessment and Plan: This is a 34 y.o. female with a symptomatic ventral hernia.  -Discussed with the patient that she indeed has 2 small hernia defects.  One of them is a very small defect at the umbilicus itself and the other one slightly bigger to the left of it.  Discussed with her that I think that is the source of her pain as she is somewhat tender to palpation during my exam.  Discussed with her that  unfortunately there is no medication or exercise that she could do to repair decrease the size of the hernia.  Unfortunately this can only grow with time if left alone.  Recommended based on her symptoms that we could proceed with a minimally invasive robotic ventral hernia repair.  Discussed with her the surgery at length and that we will close the defect and have a mesh as a second layer of repair.  Discussed with her the risks of bleeding, infection, injury to surrounding structures.  She is willing to proceed.  She also understands that she will need a COVID-19 test prior to surgery.  Also discussed with her postoperative restrictions including no heavy lifting or pushing nothing more than 10 to 15 pounds for period of 4 weeks. -We will schedule patient for 01/06/2020  Face-to-face time spent with the patient and care  providers was 45 minutes, with more than 50% of the time spent counseling, educating, and coordinating care of the patient.     Melvyn Neth, [facility]

## 2019-12-24 ENCOUNTER — Telehealth: Payer: Self-pay

## 2019-12-24 NOTE — Telephone Encounter (Signed)
Anticoagulant Clearance received from [facility]- medium risk- Patient not seen since 07/2018 Unclear if she is on Anticoagulation currently. If not recommend 2-3 day post-op Lovenox BID.

## 2019-12-29 ENCOUNTER — Ambulatory Visit: Payer: Self-pay | Admitting: Surgery

## 2019-12-30 ENCOUNTER — Encounter
Admission: RE | Admit: 2019-12-30 | Discharge: 2019-12-30 | Disposition: A | Discharge status: Home / Self Care | Payer: BC Managed Care – PPO | Source: Ambulatory Visit | Attending: Surgery | Admitting: Surgery

## 2019-12-30 DIAGNOSIS — Z01812 Encounter for preprocedural laboratory examination: Secondary | ICD-10-CM | POA: Diagnosis not present

## 2019-12-30 NOTE — Patient Instructions (Addendum)
Your procedure is scheduled on: 01/06/20- Tuesday Report to the Registration Desk on the 1st floor of the [facility]. To find out your arrival time, please call [phone] between 1PM - 3PM on: 01/05/20- Monday  REMEMBER: Instructions that are not followed completely may result in serious medical risk, up to and including death; or upon the discretion of your surgeon and anesthesiologist your surgery may need to be rescheduled.  Do not eat food after midnight the night before surgery.  No gum chewing, lozengers or hard candies.  You may however, drink CLEAR liquids up to 2 hours before you are scheduled to arrive for your surgery. Do not drink anything within 2 hours of your scheduled arrival time.  Clear liquids include: - water  - apple juice without pulp - gatorade (not RED, PURPLE, OR BLUE) - black coffee or tea (Do NOT add milk or creamers to the coffee or tea) Do NOT drink anything that is not on this list.  TAKE THESE MEDICATIONS THE MORNING OF SURGERY WITH A SIP OF WATER: - acetaminophen (TYLENOL) 500 MG tablet, may take 1000 mg if needed for moderate pain.   One week prior to surgery: Stop Taking today 12/30/19 . Stop Anti-inflammatories (NSAIDS) such as Advil, Aleve, Ibuprofen, Motrin, Naproxen, Naprosyn and Aspirin based products such as Excedrin, Goodys Powder, BC Powder.  Stop ANY OVER THE COUNTER supplements until after surgery. (However, you may continue taking Vitamin D, Vitamin B, and multivitamin up until the day before surgery.)  No Alcohol for 24 hours before or after surgery.  No Smoking including e-cigarettes for 24 hours prior to surgery.  No chewable tobacco products for at least 6 hours prior to surgery.  No nicotine patches on the day of surgery.  Do not use any "recreational" drugs for at least a week prior to your surgery.  Please be advised that the combination of cocaine and anesthesia may have negative outcomes, up to and including death. If  you test positive for cocaine, your surgery will be cancelled.  On the morning of surgery brush your teeth with toothpaste and water, you may rinse your mouth with mouthwash if you wish. Do not swallow any toothpaste or mouthwash.  Do not wear jewelry, make-up, hairpins, clips or nail polish.  Do not wear lotions, powders, or perfumes.   Do not shave body from the neck down 48 hours prior to surgery just in case you cut yourself which could leave a site for infection.  Also, freshly shaved skin may become irritated if using the CHG soap.  Contact lenses, hearing aids and dentures may not be worn into surgery.  Do not bring valuables to the hospital. [facility] is not responsible for any missing/lost belongings or valuables.   Use CHG Soap or wipes as directed on instruction sheet.  Notify your doctor if there is any change in your medical condition (cold, fever, infection).  Wear comfortable clothing (specific to your surgery type) to the hospital.  Plan for stool softeners for home use; pain medications have a tendency to cause constipation. You can also help prevent constipation by eating foods high in fiber such as fruits and vegetables and drinking plenty of fluids as your diet allows.  After surgery, you can help prevent lung complications by doing breathing exercises.  Take deep breaths and cough every 1-2 hours. Your doctor may order a device called an Incentive Spirometer to help you take deep breaths. When coughing or sneezing, hold a pillow firmly against your  incision with both hands. This is called "splinting." Doing this helps protect your incision. It also decreases belly discomfort.  If you are being admitted to the hospital overnight, leave your suitcase in the car. After surgery it may be brought to your room.  If you are being discharged the day of surgery, you will not be allowed to drive home. You will need a responsible adult (18 years or older) to drive you  home and stay with you that night.   If you are taking public transportation, you will need to have a responsible adult (18 years or older) with you. Please confirm with your physician that it is acceptable to use public transportation.   Please call the [facility] at [phone] if you have any questions about these instructions.  Visitation Policy:  Patients undergoing a surgery or procedure may have one family member or support person with them as long as that person is not COVID-19 positive or experiencing its symptoms.  That person may remain in the waiting area during the procedure.  Inpatient Visitation Update:   In an effort to ensure the safety of our team members and our patients, we are implementing a change to our visitation policy:  Effective Monday, Aug. 9, at 7 a.m., inpatients will be allowed one support person.  o The support person may change daily.  o The support person must pass our screening, gel in and out, and wear a mask at all times, including in the patient's room.  o Patients must also wear a mask when staff or their support person are in the room.  o Masking is required regardless of vaccination status.  Systemwide, no visitors 17 or younger.

## 2019-12-31 NOTE — Progress Notes (Signed)
[facility] Perioperative Services: Pre-Admission/Anesthesia Testing   Date: 12/31/19 Name: PARISHA BEAULAC MRN:   [id number]  Re: Consideration of preoperative prophylactic antibiotic change   Request sent to: Olean Ree, MD (routed and/or faxed via [facility])  Planned Surgical Procedure(s):    Case: [id number] Date/Time: 01/06/20 1020   Procedure: XI ROBOTIC ASSISTED VENTRAL HERNIA (N/A )   Anesthesia type: General   Pre-op diagnosis: Ventral hernia   Location: [facility] OR ROOM 04 / [place] [facility]   Surgeons: Olean Ree, MD     Notes: 1. Patient has a documented allergy to PCN  . Advising that PCN has caused her to experience low severity rash in the past.   2. Received cephalosporin with no documented complications  CEFTRIAXONE was given on 01/19/2015  3. Screened as appropriate for cephalosporin use during medication reconciliation . No immediate angioedema, dysphagia, SOB, anaphylaxis symptoms. . No severe rash involving mucous membranes or skin necrosis. . No hospital admissions related to side effects of PCN/cephalosporin use.  . No documented reaction to cephalosporin in the last 10 years.  Request:  As an evidence based approach to reducing the rate of incidence for post-operative SSI and the development of MDROs, could an agent with narrower coverage for preoperative prophylaxis in this patient's upcoming surgical course be considered?   1. Currently ordered preoperative prophylactic ABX: clindamycin.  2. Specifically requesting change to cephalosporin (CEFAZOLIN).   3. Please communicate decision with me and I will change the orders in Epic as per your direction.   Things to consider:  Many patients report that they were "allergic" to PCN earlier in life, however this does not translate into a true lifelong allergy. Patients can lose sensitivity to specific IgE antibodies over time if PCN is avoided (Kleris & Lugar, 2019).   Up  to 10% of the adult population and 15% of hospitalized patients report an allergy to PCN, however clinical studies suggest that 90% of those reporting an allergy can tolerate PCN antibiotics (Kleris & Lugar, 2019).   Cross-sensitivity between PCN and cephalosporins has been documented as being as high as 10%, however this estimation included data believed to have been collected in a setting where there was contamination. Newer data suggests that the prevalence of cross-sensitivity between PCN and cephalosporins is actually estimated to be closer to 1% (Hermanides et al., 2018).    Patients labeled as PCN allergic, whether they are truly allergic or not, have been found to have inferior outcomes in terms of rates of serious infection, and these patients tend to have longer hospital stays (Naknek, 2019).   Treatment related secondary infections, such as Clostridioides difficile, have been linked to the improper use of broad spectrum antibiotics in patients improperly labeled as PCN allergic (Kleris & Lugar, 2019).   Anaphylaxis from cephalosporins is rare and the evidence suggests that there is no increased risk of an anaphylactic type reaction when cephalosporins are used in a PCN allergic patient (Pichichero, 2006).  Citations: Hermanides J, Lemkes BA, Prins Pearla Dubonnet MW, Terreehorst I. Presumed -Lactam Allergy and Cross-reactivity in the Operating Theater: A Practical Approach. Anesthesiology. 2018 Aug;129(2):335-342. doi: 10.1097/ALN.0000000000002252. PMID: 07371062.  Kleris, [place]., & Lugar, P. L. (2019). Things We Do For No Reason: Failing to Question a Penicillin Allergy History. Journal of hospital medicine, 14(10), 904 021 5359. Advance online publication. https://www.wallace-middleton.info/  Pichichero, M. E. (2006). Cephalosporins can be prescribed safely for penicillin-allergic patients. Journal of family medicine, 55(2), 106-112. Accessed:  https://cdn.mdedge.com/files/s81fs-public/Document/September-2017/5502JFP_AppliedEvidence1.pdf  Honor Loh, MSN, APRN, FNP-C, CEN [facility]  Peri-operative Services Nurse Practitioner 12/31/19, 4:04 PM

## 2020-01-01 NOTE — Progress Notes (Signed)
  [facility] Perioperative Services: Pre-Admission/Anesthesia Testing     Date: 01/01/20  Name: Diana Sexton MRN:   [id number]  Re: Change in [place] for upcoming surgery   Case: [id number] Date/Time: 01/06/20 1020   Procedure: XI ROBOTIC ASSISTED VENTRAL HERNIA (N/A )   Anesthesia type: General   Pre-op diagnosis: Ventral hernia   Location: [facility] OR ROOM 04 / [place] [facility]IA [facility]   Surgeons: Olean Ree, MD     Primary attending surgeon was consulted regarding consideration of therapeutic change in antimicrobial agent being used for preoperative prophylaxis in this patient's upcoming surgical case. Following analysis of the risk versus benefits, Dr. Hampton Abbot, Jacqulyn Bath, MD advising that it would be acceptable to discontinue the ordered clindamycin and place an order for cefazolin 2 gm IV on call to the OR. Orders for this patient were amended by me following collaborative conversation with attending surgeon.  Honor Loh, MSN, APRN, FNP-C, CEN [facility]  Peri-operative Services Nurse Practitioner Phone: [phone] 01/01/20 10:03 AM

## 2020-01-02 ENCOUNTER — Other Ambulatory Visit: Payer: BC Managed Care – PPO

## 2020-01-05 ENCOUNTER — Other Ambulatory Visit
Admission: RE | Admit: 2020-01-05 | Discharge: 2020-01-05 | Disposition: A | Discharge status: Home / Self Care | Payer: BC Managed Care – PPO | Source: Ambulatory Visit | Attending: Surgery | Admitting: Surgery

## 2020-01-05 ENCOUNTER — Other Ambulatory Visit: Payer: Self-pay

## 2020-01-05 DIAGNOSIS — D6852 Prothrombin gene mutation: Secondary | ICD-10-CM | POA: Insufficient documentation

## 2020-01-05 DIAGNOSIS — Z01812 Encounter for preprocedural laboratory examination: Secondary | ICD-10-CM | POA: Insufficient documentation

## 2020-01-05 DIAGNOSIS — Z880 Allergy status to penicillin: Secondary | ICD-10-CM | POA: Diagnosis not present

## 2020-01-05 DIAGNOSIS — K432 Incisional hernia without obstruction or gangrene: Secondary | ICD-10-CM | POA: Diagnosis not present

## 2020-01-05 DIAGNOSIS — N200 Calculus of kidney: Secondary | ICD-10-CM | POA: Diagnosis not present

## 2020-01-05 DIAGNOSIS — Z86718 Personal history of other venous thrombosis and embolism: Secondary | ICD-10-CM | POA: Insufficient documentation

## 2020-01-05 DIAGNOSIS — Z20822 Contact with and (suspected) exposure to covid-19: Secondary | ICD-10-CM | POA: Insufficient documentation

## 2020-01-05 DIAGNOSIS — Z7901 Long term (current) use of anticoagulants: Secondary | ICD-10-CM | POA: Insufficient documentation

## 2020-01-05 DIAGNOSIS — K5900 Constipation, unspecified: Secondary | ICD-10-CM | POA: Diagnosis not present

## 2020-01-05 DIAGNOSIS — Z87442 Personal history of urinary calculi: Secondary | ICD-10-CM | POA: Diagnosis not present

## 2020-01-05 LAB — SARS CORONAVIRUS 2 (TAT 6-24 HRS): SARS Coronavirus 2: NEGATIVE

## 2020-01-05 LAB — TYPE AND SCREEN
ABO/RH(D): AB POS
Antibody Screen: NEGATIVE

## 2020-01-05 NOTE — Progress Notes (Signed)
  [facility] Perioperative Services: Pre-Admission/Anesthesia Testing     Date: 01/05/20  Name: Diana Sexton MRN:   [id number]  Re: Anticoagulation plans for upcoming surgery.  Patient was scheduled for robotic assisted ventral hernia repair on 1123 with Dr. Ardath Sax.  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  Patient has had a total of 2 DVTs in her life; one provoked by pregnancy, and the other unprovoked.  Hypercoagulable work-up significant for heterozygous prothrombin gene mutation, which places the patient at a 2-3 times risk of developing VTE as compared to the general population.  Patient currently followed by hematology [name], MD).  Patient has not been seen in clinic since 07/2018 at which time patient had self discontinued her apixaban therapy 1 month prior citing financial concerns.  Extended conversation held with patient by hematologist regarding increased risk for DVT due to her prothrombin gene mutation, however patient was adamant about discontinuing anticoagulant.  Hematologist discussed that development of a subsequent thromboses  would necessitate lifelong anticoagulation; patient verbalized understanding.  Regarding her upcoming procedure with Dr. Hampton Abbot, hematology issued clearance at a MODERATE risk stratification.  Dr. [name] unsure if patient is currently taking oral anticoagulant, and notes that if she is not he recommends 2 to 3 days of postoperative enoxaparin twice daily.  Surgeons office is aware of recommendations.  Honor Loh, MSN, APRN, FNP-C, CEN [facility]  Peri-operative Services Nurse Practitioner Phone: [phone] 01/05/20 11:16 AM

## 2020-01-06 ENCOUNTER — Encounter: Payer: Self-pay | Admitting: Surgery

## 2020-01-06 ENCOUNTER — Ambulatory Visit
Admission: RE | Admit: 2020-01-06 | Discharge: 2020-01-06 | Disposition: A | Discharge status: Home / Self Care | Payer: BC Managed Care – PPO | Attending: Surgery | Admitting: Surgery

## 2020-01-06 ENCOUNTER — Encounter
Admission: RE | Disposition: A | Discharge status: Home / Self Care | Payer: Self-pay | Source: Home / Self Care | Attending: Surgery

## 2020-01-06 ENCOUNTER — Ambulatory Visit: Payer: BC Managed Care – PPO | Admitting: Urgent Care

## 2020-01-06 DIAGNOSIS — Z86718 Personal history of other venous thrombosis and embolism: Secondary | ICD-10-CM | POA: Insufficient documentation

## 2020-01-06 DIAGNOSIS — N200 Calculus of kidney: Secondary | ICD-10-CM | POA: Diagnosis not present

## 2020-01-06 DIAGNOSIS — K439 Ventral hernia without obstruction or gangrene: Secondary | ICD-10-CM

## 2020-01-06 DIAGNOSIS — Z20822 Contact with and (suspected) exposure to covid-19: Secondary | ICD-10-CM | POA: Diagnosis not present

## 2020-01-06 DIAGNOSIS — F418 Other specified anxiety disorders: Secondary | ICD-10-CM | POA: Diagnosis not present

## 2020-01-06 DIAGNOSIS — Z880 Allergy status to penicillin: Secondary | ICD-10-CM | POA: Insufficient documentation

## 2020-01-06 DIAGNOSIS — K432 Incisional hernia without obstruction or gangrene: Secondary | ICD-10-CM | POA: Diagnosis not present

## 2020-01-06 DIAGNOSIS — D6852 Prothrombin gene mutation: Secondary | ICD-10-CM | POA: Diagnosis not present

## 2020-01-06 DIAGNOSIS — E7800 Pure hypercholesterolemia, unspecified: Secondary | ICD-10-CM | POA: Diagnosis not present

## 2020-01-06 DIAGNOSIS — K5900 Constipation, unspecified: Secondary | ICD-10-CM | POA: Insufficient documentation

## 2020-01-06 DIAGNOSIS — Z87442 Personal history of urinary calculi: Secondary | ICD-10-CM | POA: Insufficient documentation

## 2020-01-06 HISTORY — PX: XI ROBOTIC ASSISTED VENTRAL HERNIA: SHX6789

## 2020-01-06 LAB — POCT PREGNANCY, URINE: Preg Test, Ur: NEGATIVE

## 2020-01-06 SURGERY — REPAIR, HERNIA, VENTRAL, ROBOT-ASSISTED
Anesthesia: General

## 2020-01-06 MED ORDER — SUGAMMADEX SODIUM 500 MG/5ML IV SOLN
INTRAVENOUS | Status: AC
  Filled 2020-01-06: qty 5

## 2020-01-06 MED ORDER — SUCCINYLCHOLINE CHLORIDE 20 MG/ML IJ SOLN
INTRAMUSCULAR | Status: DC | PRN
  Administered 2020-01-06: 140 mg via INTRAVENOUS

## 2020-01-06 MED ORDER — CHLORHEXIDINE GLUCONATE 0.12 % MT SOLN
OROMUCOSAL | Status: AC
  Administered 2020-01-06: 15 mL via OROMUCOSAL
  Filled 2020-01-06: qty 15

## 2020-01-06 MED ORDER — SUCCINYLCHOLINE CHLORIDE 200 MG/10ML IV SOSY
PREFILLED_SYRINGE | INTRAVENOUS | Status: AC
  Filled 2020-01-06: qty 10

## 2020-01-06 MED ORDER — ONDANSETRON HCL 4 MG/2ML IJ SOLN
INTRAMUSCULAR | Status: AC
  Filled 2020-01-06: qty 2

## 2020-01-06 MED ORDER — PROPOFOL 10 MG/ML IV BOLUS
INTRAVENOUS | Status: AC
  Filled 2020-01-06: qty 20

## 2020-01-06 MED ORDER — KETOROLAC TROMETHAMINE 30 MG/ML IJ SOLN
INTRAMUSCULAR | Status: AC
  Filled 2020-01-06: qty 1

## 2020-01-06 MED ORDER — ONDANSETRON HCL 4 MG/2ML IJ SOLN
INTRAMUSCULAR | Status: DC | PRN
  Administered 2020-01-06: 4 mg via INTRAVENOUS

## 2020-01-06 MED ORDER — FENTANYL CITRATE (PF) 100 MCG/2ML IJ SOLN
INTRAMUSCULAR | Status: AC
  Filled 2020-01-06: qty 2

## 2020-01-06 MED ORDER — DEXMEDETOMIDINE (PRECEDEX) IN NS 20 MCG/5ML (4 MCG/ML) IV SYRINGE
PREFILLED_SYRINGE | INTRAVENOUS | Status: AC
  Filled 2020-01-06: qty 5

## 2020-01-06 MED ORDER — ENOXAPARIN SODIUM 40 MG/0.4ML ~~LOC~~ SOLN: 40.0000 mg | Freq: Two times a day (BID) | SUBCUTANEOUS | 0 refills | Status: DC

## 2020-01-06 MED ORDER — BUPIVACAINE-EPINEPHRINE 0.25% -1:200000 IJ SOLN
INTRAMUSCULAR | Status: DC | PRN
  Administered 2020-01-06: 30 mL

## 2020-01-06 MED ORDER — FENTANYL CITRATE (PF) 100 MCG/2ML IJ SOLN
25.0000 ug | INTRAMUSCULAR | Status: AC | PRN
  Administered 2020-01-06 (×8): 25 ug via INTRAVENOUS

## 2020-01-06 MED ORDER — BUPIVACAINE LIPOSOME 1.3 % IJ SUSP
INTRAMUSCULAR | Status: DC | PRN
  Administered 2020-01-06: 20 mL

## 2020-01-06 MED ORDER — FAMOTIDINE 20 MG PO TABS
ORAL_TABLET | ORAL | Status: AC
  Administered 2020-01-06: 20 mg via ORAL
  Filled 2020-01-06: qty 1

## 2020-01-06 MED ORDER — OXYCODONE HCL 5 MG PO TABS
5.0000 mg | ORAL_TABLET | Freq: Once | ORAL | Status: AC
  Administered 2020-01-06: 5 mg via ORAL

## 2020-01-06 MED ORDER — LIDOCAINE HCL (PF) 2 % IJ SOLN
INTRAMUSCULAR | Status: AC
  Filled 2020-01-06: qty 5

## 2020-01-06 MED ORDER — CEFAZOLIN SODIUM-DEXTROSE 2-4 GM/100ML-% IV SOLN
INTRAVENOUS | Status: AC
  Filled 2020-01-06: qty 100

## 2020-01-06 MED ORDER — PROPOFOL 10 MG/ML IV BOLUS
INTRAVENOUS | Status: DC | PRN
  Administered 2020-01-06: 200 mg via INTRAVENOUS

## 2020-01-06 MED ORDER — CHLORHEXIDINE GLUCONATE CLOTH 2 % EX PADS: 6.0000 | MEDICATED_PAD | Freq: Once | CUTANEOUS | Status: DC

## 2020-01-06 MED ORDER — DEXMEDETOMIDINE HCL 200 MCG/2ML IV SOLN
INTRAVENOUS | Status: DC | PRN
  Administered 2020-01-06: 4 ug via INTRAVENOUS
  Administered 2020-01-06 (×2): 8 ug via INTRAVENOUS

## 2020-01-06 MED ORDER — CELECOXIB 200 MG PO CAPS
ORAL_CAPSULE | ORAL | Status: AC
  Administered 2020-01-06: 200 mg via ORAL
  Filled 2020-01-06: qty 1

## 2020-01-06 MED ORDER — FAMOTIDINE 20 MG PO TABS: 20.0000 mg | ORAL_TABLET | Freq: Once | ORAL | Status: AC

## 2020-01-06 MED ORDER — MIDAZOLAM HCL 2 MG/2ML IJ SOLN
INTRAMUSCULAR | Status: AC
  Filled 2020-01-06: qty 2

## 2020-01-06 MED ORDER — GABAPENTIN 300 MG PO CAPS
ORAL_CAPSULE | ORAL | Status: AC
  Administered 2020-01-06: 300 mg via ORAL
  Filled 2020-01-06: qty 1

## 2020-01-06 MED ORDER — LIDOCAINE HCL (CARDIAC) PF 100 MG/5ML IV SOSY
PREFILLED_SYRINGE | INTRAVENOUS | Status: DC | PRN
  Administered 2020-01-06: 100 mg via INTRAVENOUS

## 2020-01-06 MED ORDER — CHLORHEXIDINE GLUCONATE 0.12 % MT SOLN: 15.0000 mL | Freq: Once | OROMUCOSAL | Status: AC

## 2020-01-06 MED ORDER — LACTATED RINGERS IV SOLN: INTRAVENOUS | Status: DC

## 2020-01-06 MED ORDER — ONDANSETRON HCL 4 MG/2ML IJ SOLN
4.0000 mg | Freq: Once | INTRAMUSCULAR | Status: AC | PRN
  Administered 2020-01-06: 4 mg via INTRAVENOUS

## 2020-01-06 MED ORDER — SUGAMMADEX SODIUM 200 MG/2ML IV SOLN
INTRAVENOUS | Status: DC | PRN
Start: 2020-01-06 — End: 2020-01-06
  Administered 2020-01-06: 300 mg via INTRAVENOUS

## 2020-01-06 MED ORDER — DEXAMETHASONE SODIUM PHOSPHATE 10 MG/ML IJ SOLN
INTRAMUSCULAR | Status: AC
  Filled 2020-01-06: qty 1

## 2020-01-06 MED ORDER — ROCURONIUM BROMIDE 100 MG/10ML IV SOLN
INTRAVENOUS | Status: DC | PRN
Start: 2020-01-06 — End: 2020-01-06
  Administered 2020-01-06: 10 mg via INTRAVENOUS
  Administered 2020-01-06 (×2): 20 mg via INTRAVENOUS
  Administered 2020-01-06: 30 mg via INTRAVENOUS
  Administered 2020-01-06: 40 mg via INTRAVENOUS

## 2020-01-06 MED ORDER — ACETAMINOPHEN 500 MG PO TABS
ORAL_TABLET | ORAL | Status: AC
  Administered 2020-01-06: 1000 mg via ORAL
  Filled 2020-01-06: qty 2

## 2020-01-06 MED ORDER — ACETAMINOPHEN 500 MG PO TABS: 1000.0000 mg | ORAL_TABLET | ORAL | Status: AC

## 2020-01-06 MED ORDER — BUPIVACAINE-EPINEPHRINE (PF) 0.25% -1:200000 IJ SOLN
INTRAMUSCULAR | Status: AC
  Filled 2020-01-06: qty 30

## 2020-01-06 MED ORDER — ROCURONIUM BROMIDE 10 MG/ML (PF) SYRINGE
PREFILLED_SYRINGE | INTRAVENOUS | Status: AC
  Filled 2020-01-06: qty 10

## 2020-01-06 MED ORDER — MIDAZOLAM HCL 2 MG/2ML IJ SOLN
INTRAMUSCULAR | Status: DC | PRN
  Administered 2020-01-06: 2 mg via INTRAVENOUS

## 2020-01-06 MED ORDER — OXYCODONE HCL 5 MG PO TABS
ORAL_TABLET | ORAL | Status: AC
  Filled 2020-01-06: qty 1

## 2020-01-06 MED ORDER — ORAL CARE MOUTH RINSE: 15.0000 mL | Freq: Once | OROMUCOSAL | Status: AC

## 2020-01-06 MED ORDER — FENTANYL CITRATE (PF) 100 MCG/2ML IJ SOLN
INTRAMUSCULAR | Status: DC | PRN
  Administered 2020-01-06: 100 ug via INTRAVENOUS
  Administered 2020-01-06: 50 ug via INTRAVENOUS
  Administered 2020-01-06: 100 ug via INTRAVENOUS
  Administered 2020-01-06: 50 ug via INTRAVENOUS

## 2020-01-06 MED ORDER — OXYCODONE HCL 5 MG PO TABS: 5.0000 mg | ORAL_TABLET | ORAL | 0 refills | Status: DC | PRN

## 2020-01-06 MED ORDER — IBUPROFEN 600 MG PO TABS: 600.0000 mg | ORAL_TABLET | Freq: Three times a day (TID) | ORAL | 1 refills | Status: DC | PRN

## 2020-01-06 MED ORDER — DEXAMETHASONE SODIUM PHOSPHATE 10 MG/ML IJ SOLN
INTRAMUSCULAR | Status: DC | PRN
  Administered 2020-01-06: 10 mg via INTRAVENOUS

## 2020-01-06 MED ORDER — CEFAZOLIN SODIUM-DEXTROSE 2-4 GM/100ML-% IV SOLN
2.0000 g | Freq: Once | INTRAVENOUS | Status: AC
  Administered 2020-01-06: 2 g via INTRAVENOUS

## 2020-01-06 MED ORDER — BUPIVACAINE LIPOSOME 1.3 % IJ SUSP
INTRAMUSCULAR | Status: AC
  Filled 2020-01-06: qty 20

## 2020-01-06 MED ORDER — GABAPENTIN 300 MG PO CAPS: 300.0000 mg | ORAL_CAPSULE | ORAL | Status: AC

## 2020-01-06 MED ORDER — CELECOXIB 200 MG PO CAPS: 200.0000 mg | ORAL_CAPSULE | ORAL | Status: AC

## 2020-01-06 SURGICAL SUPPLY — 59 items
ADH SKN CLS APL DERMABOND .7 (GAUZE/BANDAGES/DRESSINGS) ×1
APL PRP STRL LF DISP 70% ISPRP (MISCELLANEOUS) ×1
BINDER ABDOMINAL 12 ML 46-62 (SOFTGOODS) ×3 IMPLANT
BLADE SURG SZ11 CARB STEEL (BLADE) ×3 IMPLANT
CANISTER SUCT 1200ML W/VALVE (MISCELLANEOUS) IMPLANT
CANNULA REDUC XI 12-8 STAPL (CANNULA) ×1
CANNULA REDUC XI 12-8MM STAPL (CANNULA) ×1
CANNULA REDUCER 12-8 DVNC XI (CANNULA) ×1 IMPLANT
CHLORAPREP W/TINT 26 (MISCELLANEOUS) ×3 IMPLANT
COVER TIP SHEARS 8 DVNC (MISCELLANEOUS) ×1 IMPLANT
COVER TIP SHEARS 8MM DA VINCI (MISCELLANEOUS) ×2
COVER WAND RF STERILE (DRAPES) ×3 IMPLANT
DEFOGGER SCOPE WARMER CLEARIFY (MISCELLANEOUS) ×3 IMPLANT
DERMABOND ADVANCED (GAUZE/BANDAGES/DRESSINGS) ×2
DERMABOND ADVANCED .7 DNX12 (GAUZE/BANDAGES/DRESSINGS) ×1 IMPLANT
DRAPE ARM DVNC X/XI (DISPOSABLE) ×4 IMPLANT
DRAPE COLUMN DVNC XI (DISPOSABLE) ×1 IMPLANT
DRAPE DA VINCI XI ARM (DISPOSABLE) ×8
DRAPE DA VINCI XI COLUMN (DISPOSABLE) ×2
ELECT CAUTERY BLADE 6.4 (BLADE) ×3 IMPLANT
ELECT REM PT RETURN 9FT ADLT (ELECTROSURGICAL) ×3
ELECTRODE REM PT RTRN 9FT ADLT (ELECTROSURGICAL) ×1 IMPLANT
GLOVE SURG SYN 7.0 (GLOVE) ×6 IMPLANT
GLOVE SURG SYN 7.5  E (GLOVE) ×4
GLOVE SURG SYN 7.5 E (GLOVE) ×2 IMPLANT
GOWN STRL REUS W/ TWL LRG LVL3 (GOWN DISPOSABLE) ×3 IMPLANT
GOWN STRL REUS W/TWL LRG LVL3 (GOWN DISPOSABLE) ×9
GRASPER SUT TROCAR 14GX15 (MISCELLANEOUS) ×3 IMPLANT
IRRIGATION STRYKERFLOW (MISCELLANEOUS) IMPLANT
IRRIGATOR STRYKERFLOW (MISCELLANEOUS)
IV NS 1000ML (IV SOLUTION)
IV NS 1000ML BAXH (IV SOLUTION) IMPLANT
KIT PINK PAD W/HEAD ARE REST (MISCELLANEOUS) ×3
KIT PINK PAD W/HEAD ARM REST (MISCELLANEOUS) ×1 IMPLANT
LABEL OR SOLS (LABEL) ×3 IMPLANT
MANIFOLD NEPTUNE II (INSTRUMENTS) ×3 IMPLANT
MESH VENT LT ST 11.4CM CRL (Mesh General) ×3 IMPLANT
NEEDLE HYPO 22GX1.5 SAFETY (NEEDLE) ×3 IMPLANT
NEEDLE INSUFFLATION 14GA 120MM (NEEDLE) ×3 IMPLANT
OBTURATOR OPTICAL STANDARD 8MM (TROCAR) ×2
OBTURATOR OPTICAL STND 8 DVNC (TROCAR) ×1
OBTURATOR OPTICALSTD 8 DVNC (TROCAR) ×1 IMPLANT
PACK LAP CHOLECYSTECTOMY (MISCELLANEOUS) ×3 IMPLANT
PENCIL ELECTRO HAND CTR (MISCELLANEOUS) ×3 IMPLANT
SEAL CANN UNIV 5-8 DVNC XI (MISCELLANEOUS) ×2 IMPLANT
SEAL XI 5MM-8MM UNIVERSAL (MISCELLANEOUS) ×4
SET TUBE SMOKE EVAC HIGH FLOW (TUBING) ×3 IMPLANT
SOLUTION ELECTROLUBE (MISCELLANEOUS) ×3 IMPLANT
SPONGE LAP 18X18 RF (DISPOSABLE) ×3 IMPLANT
STAPLER CANNULA SEAL DVNC XI (STAPLE) ×1 IMPLANT
STAPLER CANNULA SEAL XI (STAPLE) ×2
SUT MNCRL 4-0 (SUTURE) ×3
SUT MNCRL 4-0 27XMFL (SUTURE) ×1
SUT STRATAFIX PDS 30 CT-1 (SUTURE) ×3 IMPLANT
SUT VICRYL 0 AB UR-6 (SUTURE) ×3 IMPLANT
SUT VLOC 90 2/L VL 12 GS22 (SUTURE) ×6 IMPLANT
SUTURE MNCRL 4-0 27XMF (SUTURE) ×1 IMPLANT
TRAY FOLEY SLVR 16FR LF STAT (SET/KITS/TRAYS/PACK) ×3 IMPLANT
TROCAR BALLN GELPORT 12X130M (ENDOMECHANICALS) ×3 IMPLANT

## 2020-01-06 NOTE — Op Note (Addendum)
  Procedure Date:  01/06/2020  Pre-operative Diagnosis:  Incisional Ventral hernia  Post-operative Diagnosis:  Incisional Ventral hernia  Procedure:  Robotic assisted IPOM Incisional Ventral Hernia Repair with mesh  Surgeon:  Melvyn Neth, MD  Anesthesia:  General endotracheal  Estimated Blood Loss:  10 ml  Specimens:  None  Complications:  None  Indications for Procedure:  This is a 34 y.o. female who presents with an incisional ventral hernia, s/p prior laparoscopic cholecystectomy.  The options of surgery versus observation were reviewed with the patient and/or family. The risks of bleeding, abscess or infection, recurrence of symptoms, potential for an open procedure, injury to surrounding structures, and chronic pain were all discussed with the patient and was willing to proceed.  Description of Procedure: The patient was correctly identified in the preoperative area and brought into the operating room.  The patient was placed supine with VTE prophylaxis in place.  Appropriate time-outs were performed.  Anesthesia was induced and the patient was intubated.  Appropriate antibiotics were infused.  The abdomen was prepped and draped in a sterile fashion. The patient's hernia defect was marked with a marking pen.  A Veress needle was introduced in the right upper quadrant and pneumoperitoneum was obtained with appropriate pressures.  Optivue technique was used to insert an 8 mm robotic trocar in the right lateral abdominal wall, followed by an additional 8 mm in the RUQ and a 12 mm port in the RLQ.  All these were done under direct visualization.  The DaVinci platform was docked from the patient's right side, camera targeted, and instruments placed under direct visualization.  The patient's ventral hernia was noted to contain both omentum and an edge of a small bowel loop.  These were carefully dissected using combination of cautery and sharp dissection.  There was no bowel injury.   The hernia defect was closed in layers using 0 Stratafix suture.  The hernia defect was measured to be 5 cm and a 11.2 cm circular Bard Ventralight ST Echo mesh was placed via the 12-mm port.  The positioning system was insufflated and the mesh was placed over the defect covering it entirely.  The mesh was sutured in place using 2-0 V-lock suture circumferentially.  The 12-mm port was removed and the fascia was closed under direct visualization utilizing an Endo Close technique with 0 Vicryl interrupted sutures.  It was noted on the liver edge there was a small injury from the veress needle.  This was cauterized.  The 8 mm ports were removed.  Local anesthetic was infused in all incisions.  The RLQ incision was closed using 3-0 Vicryl and 4-0 Monocryl, and the other incisions were closed with 4-0 Monocryl.  The wounds were cleaned and sealed with DermaBond.  The patient was emerged from anesthesia and extubated and brought to the recovery room for further management.  The patient tolerated the procedure well and all counts were correct at the end of the case.   Melvyn Neth, MD

## 2020-01-06 NOTE — Discharge Instructions (Addendum)
Enoxaparin injection What is this medicine? ENOXAPARIN (ee nox a PA rin) is used after knee, hip, or abdominal surgeries to prevent blood clotting. It is also used to treat existing blood clots in the lungs or in the veins. This medicine may be used for other purposes; ask your health care provider or pharmacist if you have questions. COMMON BRAND NAME(S): Lovenox What should I tell my health care provider before I take this medicine? They need to know if you have any of these conditions:  bleeding disorders, hemorrhage, or hemophilia  infection of the heart or heart valves  kidney or liver disease  previous stroke  prosthetic heart valve  recent surgery or delivery of a baby  ulcer in the stomach or intestine, diverticulitis, or other bowel disease  an unusual or allergic reaction to enoxaparin, heparin, pork or pork products, other medicines, foods, dyes, or preservatives  pregnant or trying to get pregnant  breast-feeding How should I use this medicine? This medicine is for injection under the skin. It is usually given by a health-care professional. You or a family member may be trained on how to give the injections. If you are to give yourself injections, make sure you understand how to use the syringe, measure the dose if necessary, and give the injection. To avoid bruising, do not rub the site where this medicine has been injected. Do not take your medicine more often than directed. Do not stop taking except on the advice of your doctor or health care professional. Make sure you receive a puncture-resistant container to dispose of the needles and syringes once you have finished with them. Do not reuse these items. Return the container to your doctor or health care professional for proper disposal. Talk to your pediatrician regarding the use of this medicine in children. Special care may be needed. Overdosage: If you think you have taken too much of this medicine contact a poison  control center or emergency room at once. NOTE: This medicine is only for you. Do not share this medicine with others. What if I miss a dose? If you miss a dose, take it as soon as you can. If it is almost time for your next dose, take only that dose. Do not take double or extra doses. What may interact with this medicine?  aspirin and aspirin-like medicines  certain medicines that treat or prevent blood clots  dipyridamole  NSAIDs, medicines for pain and inflammation, like ibuprofen or naproxen This list may not describe all possible interactions. Give your health care provider a list of all the medicines, herbs, non-prescription drugs, or dietary supplements you use. Also tell them if you smoke, drink alcohol, or use illegal drugs. Some items may interact with your medicine. What should I watch for while using this medicine? Visit your healthcare professional for regular checks on your progress. You may need blood work done while you are taking this medicine. Your condition will be monitored carefully while you are receiving this medicine. It is important not to miss any appointments. If you are going to need surgery or other procedure, tell your healthcare professional that you are using this medicine. Using this medicine for a long time may weaken your bones and increase the risk of bone fractures. Avoid sports and activities that might cause injury while you are using this medicine. Severe falls or injuries can cause unseen bleeding. Be careful when using sharp tools or knives. Consider using an Copy. Take special care brushing or flossing your  teeth. Report any injuries, bruising, or red spots on the skin to your healthcare professional. Wear a medical ID bracelet or chain. Carry a card that describes your disease and details of your medicine and dosage times. What side effects may I notice from receiving this medicine? Side effects that you should report to your doctor or health  care professional as soon as possible:  allergic reactions like skin rash, itching or hives, swelling of the face, lips, or tongue  bone pain  signs and symptoms of bleeding such as bloody or black, tarry stools; red or dark-brown urine; spitting up blood or brown material that looks like coffee grounds; red spots on the skin; unusual bruising or bleeding from the eye, gums, or nose  signs and symptoms of a blood clot such as chest pain; shortness of breath; pain, swelling, or warmth in the leg  signs and symptoms of a stroke such as changes in vision; confusion; trouble speaking or understanding; severe headaches; sudden numbness or weakness of the face, arm or leg; trouble walking; dizziness; loss of coordination Side effects that usually do not require medical attention (report to your doctor or health care professional if they continue or are bothersome):  hair loss  pain, redness, or irritation at site where injected This list may not describe all possible side effects. Call your doctor for medical advice about side effects. You may report side effects to FDA at 1-800-FDA-1088. Where should I keep my medicine? Keep out of the reach of children. Store at room temperature between 15 and 30 degrees C (59 and 86 degrees F). Do not freeze. If your injections have been specially prepared, you may need to store them in the refrigerator. Ask your pharmacist. Throw away any unused medicine after the expiration date. NOTE: This sheet is a summary. It may not cover all possible information. If you have questions about this medicine, talk to your doctor, pharmacist, or health care provider.  2020 Elsevier/Gold Standard (2017-01-25 11:25:34) Laparoscopic Ventral Hernia Repair, Care After This sheet gives you information about how to care for yourself after your procedure. Your health care provider may also give you more specific instructions. If you have problems or questions, contact your health  care provider. What can I expect after the procedure? After the procedure, it is common to have:  Pain, discomfort, or soreness. Follow these instructions at home: Incision care   Follow instructions from your health care provider about how to take care of your incision. Make sure you: ? Wash your hands with soap and water before you change your bandage (dressing) or before you touch your abdomen. If soap and water are not available, use hand sanitizer. ? Change your dressing as told by your health care provider. ? Leave stitches (sutures), skin glue, or adhesive strips in place. These skin closures may need to stay in place for 2 weeks or longer. If adhesive strip edges start to loosen and curl up, you may trim the loose edges. Do not remove adhesive strips completely unless your health care provider tells you to do that.  Check your incision area every day for signs of infection. Check for: ? Redness, swelling, or pain. ? Fluid or blood. ? Warmth. ? Pus or a bad smell. Bathing   Do not take baths, swim, or use a hot tub until your health care provider approves. Ask your health care provider if you can take showers. You may only be allowed to take sponge baths for bathing.  Keep  your bandage (dressing) dry until your health care provider says it can be removed. Activity  Do not lift anything that is heavier than 10 lb (4.5 kg) until your health care provider approves.  Do not drive or use heavy machinery while taking prescription pain medicine. Ask your health care provider when it is safe for you to drive or use heavy machinery.  Do not drive for 24 hours if you were given a medicine to help you relax (sedative) during your procedure.  Rest as told by your health care provider. You may return to your normal activities when your health care provider approves. General instructions  Take over-the-counter and prescription medicines only as told by your health care provider.  To  prevent or treat constipation while you are taking prescription pain medicine, your health care provider may recommend that you: ? Take over-the-counter or prescription medicines. ? Eat foods that are high in fiber, such as fresh fruits and vegetables, whole grains, and beans. ? Limit foods that are high in fat and processed sugars, such as fried and sweet foods.  Drink enough fluid to keep your urine clear or pale yellow.  Hold a pillow over your abdomen when you cough or sneeze. This helps with pain.  Keep all follow-up visits as told by your health care provider. This is important. Contact a health care provider if:  You have: ? A fever or chills. ? Redness, swelling, or pain around your incision. ? Fluid or blood coming from your incision. ? Pus or a bad smell coming from your incision. ? Pain that gets worse or does not get better with medicine. ? Nausea or vomiting. ? A cough. ? Shortness of breath.  Your incision feels warm to the touch.  You have not had a bowel movement in three days.  You are not able to urinate. Get help right away if:  You have severe pain in your abdomen.  You have persistent nausea and vomiting.  You have redness, warmth, or pain in your leg.  You have chest pain.  You have trouble breathing. Summary  After this procedure, it is common to have pain, discomfort, or soreness.  Follow instructions from your health care provider about how to take care of your incision.  Check your incision area every day for signs of infection. Report any signs of infection to your health care provider.  Keep all follow-up visits as told by your health care provider. This is important. This information is not intended to replace advice given to you by your health care provider. Make sure you discuss any questions you have with your health care provider. Document Revised: 01/12/2017 Document Reviewed: 09/22/2015 Elsevier Patient Education  [place]. Bupivacaine Liposomal Suspension for Injection What is this medicine? BUPIVACAINE LIPOSOMAL (bue PIV a kane LIP oh som al) is an anesthetic. It causes loss of feeling in the skin or other tissues. It is used to prevent and to treat pain from some procedures. This medicine may be used for other purposes; ask your health care provider or pharmacist if you have questions. COMMON BRAND NAME(S): EXPAREL What should I tell my health care provider before I take this medicine? They need to know if you have any of these conditions:  G6PD deficiency  heart disease  kidney disease  liver disease  low blood pressure  lung or breathing disease, like asthma  an unusual or allergic reaction to bupivacaine, other medicines, foods, dyes, or preservatives  pregnant or trying  to get pregnant  breast-feeding How should I use this medicine? This medicine is for injection into the affected area. It is given by a health care professional in a hospital or clinic setting. Talk to your pediatrician regarding the use of this medicine in children. Special care may be needed. Overdosage: If you think you have taken too much of this medicine contact a poison control center or emergency room at once. NOTE: This medicine is only for you. Do not share this medicine with others. What if I miss a dose? This does not apply. What may interact with this medicine? This medicine may interact with the following medications:  acetaminophen  certain antibiotics like dapsone, nitrofurantoin, aminosalicylic acid, sulfonamides  certain medicines for seizures like phenobarbital, phenytoin, valproic acid  chloroquine  cyclophosphamide  flutamide  hydroxyurea  ifosfamide  metoclopramide  nitric oxide  nitroglycerin  nitroprusside  nitrous oxide  other local anesthetics like lidocaine, pramoxine, tetracaine  primaquine  quinine  rasburicase  sulfasalazine This list may not describe all  possible interactions. Give your health care provider a list of all the medicines, herbs, non-prescription drugs, or dietary supplements you use. Also tell them if you smoke, drink alcohol, or use illegal drugs. Some items may interact with your medicine. What should I watch for while using this medicine? Your condition will be monitored carefully while you are receiving this medicine. Be careful to avoid injury while the area is numb, and you are not aware of pain. What side effects may I notice from receiving this medicine? Side effects that you should report to your doctor or health care professional as soon as possible:  allergic reactions like skin rash, itching or hives, swelling of the face, lips, or tongue  seizures  signs and symptoms of a dangerous change in heartbeat or heart rhythm like chest pain; dizziness; fast, irregular heartbeat; palpitations; feeling faint or lightheaded; falls; breathing problems  signs and symptoms of methemoglobinemia such as pale, gray, or blue colored skin; headache; fast heartbeat; shortness of breath; feeling faint or lightheaded, falls; tiredness Side effects that usually do not require medical attention (report to your doctor or health care professional if they continue or are bothersome):  anxious  back pain  changes in taste  changes in vision  constipation  dizziness  fever  nausea, vomiting This list may not describe all possible side effects. Call your doctor for medical advice about side effects. You may report side effects to FDA at 1-800-FDA-1088. Where should I keep my medicine? This drug is given in a hospital or clinic and will not be stored at home. NOTE: This sheet is a summary. It may not cover all possible information. If you have questions about this medicine, talk to your doctor, pharmacist, or health care provider.  2020 Elsevier/Gold Standard (2018-11-12 10:48:23)   AMBULATORY SURGERY  DISCHARGE  INSTRUCTIONS   1) The drugs that you were given will stay in your system until tomorrow so for the next 24 hours you should not:  A) Drive an automobile B) Make any legal decisions C) Drink any alcoholic beverage   2) You may resume regular meals tomorrow.  Today it is better to start with liquids and gradually work up to solid foods.  You may eat anything you prefer, but it is better to start with liquids, then soup and crackers, and gradually work up to solid foods.   3) Please notify your doctor immediately if you have any unusual bleeding, trouble breathing, redness and  pain at the surgery site, drainage, fever, or pain not relieved by medication.    4) Additional Instructions:  Follow up with Dr. Hampton Abbot on December 10th at 09:30 am.  Please contact your physician with any problems or Same Day Surgery at [phone], Monday through Friday 6 am to 4 pm, or [place] at [facility] number at [phone].

## 2020-01-06 NOTE — Anesthesia Preprocedure Evaluation (Signed)
Anesthesia Evaluation  Patient identified by MRN, date of birth, ID band Patient awake    Reviewed: Allergy & Precautions, NPO status , Patient's Chart, lab work & pertinent test results  History of Anesthesia Complications Negative for: history of anesthetic complications  Airway Mallampati: II       Dental   Pulmonary neg sleep apnea, neg COPD, Not current smoker,           Cardiovascular (-) hypertension(-) Past MI and (-) CHF (-) dysrhythmias (-) Valvular Problems/Murmurs     Neuro/Psych neg Seizures Anxiety Depression    GI/Hepatic Neg liver ROS, neg GERD  ,  Endo/Other  neg diabetesMorbid obesity  Renal/GU Renal disease (stones)     Musculoskeletal   Abdominal   Peds  Hematology  (+) anemia ,   Anesthesia Other Findings   Reproductive/Obstetrics                             Anesthesia Physical Anesthesia Plan  ASA: III  Anesthesia Plan: General   Post-op Pain Management:    Induction: Intravenous  PONV Risk Score and Plan: 3 and Ondansetron and Dexamethasone  Airway Management Planned: Oral ETT  Additional Equipment:   Intra-op Plan:   Post-operative Plan:   Informed Consent: I have reviewed the patients History and Physical, chart, labs and discussed the procedure including the risks, benefits and alternatives for the proposed anesthesia with the patient or authorized representative who has indicated his/her understanding and acceptance.       Plan Discussed with:   Anesthesia Plan Comments:         Anesthesia Quick Evaluation

## 2020-01-06 NOTE — Anesthesia Procedure Notes (Signed)
Procedure Name: Intubation Date/Time: 01/06/2020 10:28 AM Performed by: Tollie Eth, CRNA Pre-anesthesia Checklist: Patient identified, Patient being monitored, Timeout performed, Emergency Drugs available and Suction available Patient Re-evaluated:Patient Re-evaluated prior to induction Oxygen Delivery Method: Circle system utilized Preoxygenation: Pre-oxygenation with 100% oxygen Induction Type: IV induction Ventilation: Mask ventilation without difficulty Laryngoscope Size: McGraph and 4 Grade View: Grade I Tube type: Oral Tube size: 7.0 mm Number of attempts: 1 Airway Equipment and Method: Stylet and Video-laryngoscopy Placement Confirmation: ETT inserted through vocal cords under direct vision,  positive ETCO2 and breath sounds checked- equal and bilateral Secured at: 21 cm Tube secured with: Tape Dental Injury: Teeth and Oropharynx as per pre-operative assessment

## 2020-01-06 NOTE — Transfer of Care (Signed)
Immediate Anesthesia Transfer of Care Note  Patient: Diana Sexton  Procedure(s) Performed: XI ROBOTIC ASSISTED VENTRAL HERNIA (N/A )  Patient Location: PACU  Anesthesia Type:General  Level of Consciousness: drowsy  Airway & Oxygen Therapy: Patient Spontanous Breathing and Patient connected to face mask oxygen  Post-op Assessment: Report given to RN and Post -op Vital signs reviewed and stable  Post vital signs: Reviewed and stable  Last Vitals:  Vitals Value Taken Time  BP 123/75 01/06/20 1405  Temp    Pulse 88 01/06/20 1408  Resp 20 01/06/20 1408  SpO2 99 % 01/06/20 1408  Vitals shown include unvalidated device data.  Last Pain:  Vitals:   01/06/20 0847  TempSrc: Oral  PainSc: 3          Complications: No complications documented.

## 2020-01-06 NOTE — Anesthesia Postprocedure Evaluation (Signed)
Anesthesia Post Note  Patient: Diana Sexton  Procedure(s) Performed: XI ROBOTIC ASSISTED VENTRAL HERNIA (N/A )  Patient location during evaluation: PACU Anesthesia Type: General Level of consciousness: awake and alert Pain management: pain level controlled Vital Signs Assessment: post-procedure vital signs reviewed and stable Respiratory status: spontaneous breathing and respiratory function stable Cardiovascular status: stable Anesthetic complications: no   No complications documented.   Last Vitals:  Vitals:   01/06/20 1505 01/06/20 1520  BP: 140/63 138/76  Pulse: 90 89  Resp: 16 15  Temp:  36.6 C  SpO2: 96% 96%    Last Pain:  Vitals:   01/06/20 1520  TempSrc:   PainSc: 4                  Janith Nielson K

## 2020-01-06 NOTE — Interval H&P Note (Signed)
History and Physical Interval Note:  01/06/2020 9:54 AM  Diana Sexton  has presented today for surgery, with the diagnosis of Ventral hernia.  The various methods of treatment have been discussed with the patient and family. After consideration of risks, benefits and other options for treatment, the patient has consented to  Procedure(s): XI ROBOTIC [place] (N/A) as a surgical intervention.  The patient's history has been reviewed, patient examined, no change in status, stable for surgery.  I have reviewed the patient's chart and labs.  Questions were answered to the patient's satisfaction.     Yetunde Leis

## 2020-01-07 ENCOUNTER — Encounter: Payer: Self-pay | Admitting: Surgery

## 2020-01-09 ENCOUNTER — Encounter: Payer: Self-pay | Admitting: Surgery

## 2020-01-11 ENCOUNTER — Encounter: Payer: Self-pay | Admitting: Surgery

## 2020-01-12 ENCOUNTER — Encounter: Payer: Self-pay | Admitting: Surgery

## 2020-01-12 ENCOUNTER — Telehealth: Payer: Self-pay | Admitting: *Deleted

## 2020-01-12 ENCOUNTER — Ambulatory Visit (INDEPENDENT_AMBULATORY_CARE_PROVIDER_SITE_OTHER): Payer: BC Managed Care – PPO | Admitting: Surgery

## 2020-01-12 VITALS — BP 123/76 | HR 76 | Temp 97.7°F | Ht 65.0 in | Wt 316.2 lb

## 2020-01-12 DIAGNOSIS — R21 Rash and other nonspecific skin eruption: Secondary | ICD-10-CM

## 2020-01-12 DIAGNOSIS — T8149XA Infection following a procedure, other surgical site, initial encounter: Secondary | ICD-10-CM

## 2020-01-12 MED ORDER — SULFAMETHOXAZOLE-TRIMETHOPRIM 800-160 MG PO TABS: 1.0000 | ORAL_TABLET | Freq: Two times a day (BID) | ORAL | 0 refills | Status: AC

## 2020-01-12 NOTE — Telephone Encounter (Signed)
Patient seeing Dr Hampton Abbot in [place] office today.

## 2020-01-12 NOTE — Telephone Encounter (Signed)
Patient called and stated that she had surgery on 01/06/20 by Dr Hampton Abbot ventral hernia and since then she has developed a rash around the incisions and all over her stomach area. She stated that its very itchy and red and it looks like one of the bumps over her stomach is draining. She sent a picture through mychart but this morning it looks worse. Please call and advise

## 2020-01-12 NOTE — Patient Instructions (Addendum)
Dr.Piscoya discussed with patient and prescribed Bactrim DS for 10 days for possible infection at incisional site.  Patient advised to try over the counter Benadryl pill or Benadryl ointment to help with whole body rash. Patient advised if rash has worsen by Tuesday to give our office a call Wednesday. Patient will keep follow up appointment scheduled with Dr.Piscoya.   GENERAL POST-OPERATIVE PATIENT INSTRUCTIONS   WOUND CARE INSTRUCTIONS:  Keep a dry clean dressing on the wound if there is drainage. The initial bandage may be removed after 24 hours.  Once the wound has quit draining you may leave it open to air.  If clothing rubs against the wound or causes irritation and the wound is not draining you may cover it with a dry dressing during the daytime.  Try to keep the wound dry and avoid ointments on the wound unless directed to do so.  If the wound becomes bright red and painful or starts to drain infected material that is not clear, please contact your physician immediately.  If the wound is mildly pink and has a thick firm ridge underneath it, this is normal, and is referred to as a healing ridge.  This will resolve over the next 4-6 weeks.  BATHING: You may shower if you have been informed of this by your surgeon. However, Please do not submerge in a tub, hot tub, or pool until incisions are completely sealed or have been told by your surgeon that you may do so.  DIET:  You may eat any foods that you can tolerate.  It is a good idea to eat a high fiber diet and take in plenty of fluids to prevent constipation.  If you do become constipated you may want to take a mild laxative or take ducolax tablets on a daily basis until your bowel habits are regular.  Constipation can be very uncomfortable, along with straining, after recent surgery.  ACTIVITY:  You are encouraged to cough and deep breath or use your incentive spirometer if you were given one, every 15-30 minutes when awake.  This will help  prevent respiratory complications and low grade fevers post-operatively if you had a general anesthetic.  You may want to hug a pillow when coughing and sneezing to add additional support to the surgical area, if you had abdominal or chest surgery, which will decrease pain during these times.  You are encouraged to walk and engage in light activity for the next two weeks.  You should not lift more than 20 pounds, until 4 to 6 weeks after surgery as it could put you at increased risk for complications.  Twenty pounds is roughly equivalent to a plastic bag of groceries. At that time- Listen to your body when lifting, if you have pain when lifting, stop and then try again in a few days. Soreness after doing exercises or activities of daily living is normal as you get back in to your normal routine.  MEDICATIONS:  Try to take narcotic medications and anti-inflammatory medications, such as tylenol, ibuprofen, naprosyn, etc., with food.  This will minimize stomach upset from the medication.  Should you develop nausea and vomiting from the pain medication, or develop a rash, please discontinue the medication and contact your physician.  You should not drive, make important decisions, or operate machinery when taking narcotic pain medication.  SUNBLOCK Use sun block to incision area over the next year if this area will be exposed to sun. This helps decrease scarring and will allow you  avoid a permanent darkened area over your incision.  QUESTIONS:  Please feel free to call our office if you have any questions, and we will be glad to assist you. [phone].   Wound Care, Adult Taking care of your wound properly can help to prevent pain, infection, and scarring. It can also help your wound to heal more quickly. How to care for your wound Wound care      Follow instructions from your health care provider about how to take care of your wound. Make sure you: ? Wash your hands with soap and water before you  change the bandage (dressing). If soap and water are not available, use hand sanitizer. ? Change your dressing as told by your health care provider. ? Leave stitches (sutures), skin glue, or adhesive strips in place. These skin closures may need to stay in place for 2 weeks or longer. If adhesive strip edges start to loosen and curl up, you may trim the loose edges. Do not remove adhesive strips completely unless your health care provider tells you to do that.  Check your wound area every day for signs of infection. Check for: ? Redness, swelling, or pain. ? Fluid or blood. ? Warmth. ? Pus or a bad smell.  Ask your health care provider if you should clean the wound with mild soap and water. Doing this may include: ? Using a clean towel to pat the wound dry after cleaning it. Do not rub or scrub the wound. ? Applying a cream or ointment. Do this only as told by your health care provider. ? Covering the incision with a clean dressing.  Ask your health care provider when you can leave the wound uncovered.  Keep the dressing dry until your health care provider says it can be removed. Do not take baths, swim, use a hot tub, or do anything that would put the wound underwater until your health care provider approves. Ask your health care provider if you can take showers. You may only be allowed to take sponge baths. Medicines   If you were prescribed an antibiotic medicine, cream, or ointment, take or use the antibiotic as told by your health care provider. Do not stop taking or using the antibiotic even if your condition improves.  Take over-the-counter and prescription medicines only as told by your health care provider. If you were prescribed pain medicine, take it 30 or more minutes before you do any wound care or as told by your health care provider. General instructions  Return to your normal activities as told by your health care provider. Ask your health care provider what activities are  safe.  Do not scratch or pick at the wound.  Do not use any products that contain nicotine or tobacco, such as cigarettes and e-cigarettes. These may delay wound healing. If you need help quitting, ask your health care provider.  Keep all follow-up visits as told by your health care provider. This is important.  Eat a diet that includes protein, vitamin A, vitamin C, and other nutrient-rich foods to help the wound heal. ? Foods rich in protein include meat, dairy, beans, nuts, and other sources. ? Foods rich in vitamin A include carrots and dark green, leafy vegetables. ? Foods rich in vitamin C include citrus, tomatoes, and other fruits and vegetables. ? Nutrient-rich foods have protein, carbohydrates, fat, vitamins, or minerals. Eat a variety of healthy foods including vegetables, fruits, and whole grains. Contact a health care provider if:  You received  a tetanus shot and you have swelling, severe pain, redness, or bleeding at the injection site.  Your pain is not controlled with medicine.  You have redness, swelling, or pain around the wound.  You have fluid or blood coming from the wound.  Your wound feels warm to the touch.  You have pus or a bad smell coming from the wound.  You have a fever or chills.  You are nauseous or you vomit.  You are dizzy. Get help right away if:  You have a red streak going away from your wound.  The edges of the wound open up and separate.  Your wound is bleeding, and the bleeding does not stop with gentle pressure.  You have a rash.  You faint.  You have trouble breathing. Summary  Always wash your hands with soap and water before changing your bandage (dressing).  To help with healing, eat foods that are rich in protein, vitamin A, vitamin C, and other nutrients.  Check your wound every day for signs of infection. Contact your health care provider if you suspect that your wound is infected. This information is not intended to  replace advice given to you by your health care provider. Make sure you discuss any questions you have with your health care provider. Document Revised: 05/20/2018 Document Reviewed: 08/17/2015 Elsevier Patient Education  [place].

## 2020-01-12 NOTE — Progress Notes (Signed)
01/12/2020  HPI: Diana Sexton is a 34 y.o. female s/p robotic assisted IPOM ventraln incisional hernia repair on 11/23.  She reports that she has developed erythema around the incisions and has since developed a body rahs in her torso.  She is itching quite a lot.  Has not taken anything yet for it.  She noticed some old bloody drainage from the middle right lateral incision.    She has been taking Tylenol, Ibuprofen, and Oxycodone for her post-surgical pain, and she took Lovenox for DVT prophylaxis for 3 days post-op as instructed.  She reports only having allergies to PCN, and has taken Percocet in the past.  Vital signs: BP 123/76    Pulse 76    Temp 97.7 F (36.5 C) (Oral)    Ht 5\' 5"  (1.651 m)    Wt (!) 316 lb 3.2 oz (143.4 kg)    SpO2 94%    BMI 52.62 kg/m    Physical Exam: Constitutional:  No acute distress Abdomen:  Soft, obese, non-distended, with some tenderness to palpation around the incisions and in the umbilical area as expected from her surgery a week ago.  There is erythema surrounding the top two incisions for about 1 cm around the incisions, but more widespread to about 3-5 cm around the inferior larger incision.  There is no drainage from any of the incisions.  There is some ecchymosis around the middle incision.  In addition, she also has widespread papular rash around her torso, anteriorly and laterally.  Assessment/Plan: This is a 34 y.o. female s/p robotic assisted ventral incisional hernia repair   --Patient appears to have a rash as well as possible wound infection, particularly of the inferior incision.  This could all be related to an allergic reaction, but as a precaution will give a 10 day course of Bactrim. --In addition, recommended that she start taking Benadryl po to help with the itching and rash, and she can use Benadryl ointment as well over her incisions. --Follow up next week on 12/8 or sooner if any worsening.   Melvyn Neth, [facility]

## 2020-01-19 ENCOUNTER — Telehealth: Payer: Self-pay | Admitting: *Deleted

## 2020-01-19 NOTE — Telephone Encounter (Signed)
Patient states she is having intermittent pain begins in lower back and lower abdomen. The pain started the night before. Last bowel movement this morning, small. She stated she she has not had a good bowel movement in quite sometime. She can pass gas. Denies fever. Denies vomiting but has had some nausea maybe from bowels not moving well.  Patient was instructed to increase water intake and call office if bowel movements are not better after taking Miralax.

## 2020-01-19 NOTE — Telephone Encounter (Signed)
Patient called and stated that she had ventral hernia repair on 01/06/20 Dr Hampton Abbot and a couple of nights ago she started experiencing really bad back pain and cramps. She gets nauseous and will get hot and cold, she stated that will come and go. She has not had any fevers. She is taking tylenol Advil for pain. She called her primary care physician was told that she need to talk with [place].

## 2020-01-20 ENCOUNTER — Other Ambulatory Visit: Payer: Self-pay

## 2020-01-20 ENCOUNTER — Emergency Department: Payer: BC Managed Care – PPO

## 2020-01-20 ENCOUNTER — Encounter: Payer: Self-pay | Admitting: Intensive Care

## 2020-01-20 ENCOUNTER — Emergency Department
Admission: EM | Admit: 2020-01-20 | Discharge: 2020-01-20 | Disposition: A | Discharge status: Home / Self Care | Payer: BC Managed Care – PPO | Attending: Emergency Medicine | Admitting: Emergency Medicine

## 2020-01-20 DIAGNOSIS — N83292 Other ovarian cyst, left side: Secondary | ICD-10-CM | POA: Diagnosis not present

## 2020-01-20 DIAGNOSIS — R1030 Lower abdominal pain, unspecified: Secondary | ICD-10-CM | POA: Diagnosis not present

## 2020-01-20 DIAGNOSIS — N857 Hematometra: Secondary | ICD-10-CM | POA: Diagnosis not present

## 2020-01-20 DIAGNOSIS — I10 Essential (primary) hypertension: Secondary | ICD-10-CM | POA: Diagnosis not present

## 2020-01-20 DIAGNOSIS — R9389 Abnormal findings on diagnostic imaging of other specified body structures: Secondary | ICD-10-CM

## 2020-01-20 DIAGNOSIS — N83202 Unspecified ovarian cyst, left side: Secondary | ICD-10-CM | POA: Diagnosis not present

## 2020-01-20 DIAGNOSIS — R109 Unspecified abdominal pain: Secondary | ICD-10-CM | POA: Diagnosis not present

## 2020-01-20 DIAGNOSIS — K5900 Constipation, unspecified: Secondary | ICD-10-CM | POA: Diagnosis not present

## 2020-01-20 DIAGNOSIS — R102 Pelvic and perineal pain: Secondary | ICD-10-CM

## 2020-01-20 DIAGNOSIS — N83201 Unspecified ovarian cyst, right side: Secondary | ICD-10-CM

## 2020-01-20 DIAGNOSIS — N83291 Other ovarian cyst, right side: Secondary | ICD-10-CM | POA: Diagnosis not present

## 2020-01-20 DIAGNOSIS — R100 Acute abdomen: Secondary | ICD-10-CM | POA: Diagnosis not present

## 2020-01-20 LAB — URINALYSIS, COMPLETE (UACMP) WITH MICROSCOPIC
Bilirubin Urine: NEGATIVE
Glucose, UA: NEGATIVE mg/dL
Ketones, ur: NEGATIVE mg/dL
Leukocytes,Ua: NEGATIVE
Nitrite: NEGATIVE
Protein, ur: NEGATIVE mg/dL
Specific Gravity, Urine: 1.006 (ref 1.005–1.030)
pH: 5 (ref 5.0–8.0)

## 2020-01-20 LAB — CBC
HCT: 40.4 % (ref 36.0–46.0)
Hemoglobin: 13.1 g/dL (ref 12.0–15.0)
MCH: 24.8 pg — ABNORMAL LOW (ref 26.0–34.0)
MCHC: 32.4 g/dL (ref 30.0–36.0)
MCV: 76.4 fL — ABNORMAL LOW (ref 80.0–100.0)
Platelets: 409 10*3/uL — ABNORMAL HIGH (ref 150–400)
RBC: 5.29 MIL/uL — ABNORMAL HIGH (ref 3.87–5.11)
RDW: 15.7 % — ABNORMAL HIGH (ref 11.5–15.5)
WBC: 8.3 10*3/uL (ref 4.0–10.5)
nRBC: 0 % (ref 0.0–0.2)

## 2020-01-20 LAB — COMPREHENSIVE METABOLIC PANEL
ALT: 18 U/L (ref 0–44)
AST: 17 U/L (ref 15–41)
Albumin: 3.7 g/dL (ref 3.5–5.0)
Alkaline Phosphatase: 64 U/L (ref 38–126)
Anion gap: 9 (ref 5–15)
BUN: 8 mg/dL (ref 6–20)
CO2: 24 mmol/L (ref 22–32)
Calcium: 9.3 mg/dL (ref 8.9–10.3)
Chloride: 104 mmol/L (ref 98–111)
Creatinine, Ser: 0.59 mg/dL (ref 0.44–1.00)
GFR, Estimated: >60 mL/min (ref >60)
Glucose, Bld: 98 mg/dL (ref 70–99)
Potassium: 3.9 mmol/L (ref 3.5–5.1)
Sodium: 137 mmol/L (ref 135–145)
Total Bilirubin: 0.4 mg/dL (ref 0.3–1.2)
Total Protein: 8.1 g/dL (ref 6.5–8.1)

## 2020-01-20 LAB — POC URINE PREG, ED: Preg Test, Ur: NEGATIVE

## 2020-01-20 LAB — LIPASE, BLOOD: Lipase: 24 U/L (ref 11–51)

## 2020-01-20 MED ORDER — IOHEXOL 300 MG/ML  SOLN
125.0000 mL | Freq: Once | INTRAMUSCULAR | Status: AC | PRN
  Administered 2020-01-20: 125 mL via INTRAVENOUS

## 2020-01-20 MED ORDER — HYDROMORPHONE HCL 1 MG/ML IJ SOLN
1.0000 mg | Freq: Once | INTRAMUSCULAR | Status: DC
  Filled 2020-01-20: qty 1

## 2020-01-20 MED ORDER — METOCLOPRAMIDE HCL 5 MG/ML IJ SOLN
10.0000 mg | Freq: Once | INTRAMUSCULAR | Status: AC
  Administered 2020-01-20: 10 mg via INTRAVENOUS
  Filled 2020-01-20: qty 2

## 2020-01-20 MED ORDER — MORPHINE SULFATE (PF) 4 MG/ML IV SOLN
6.0000 mg | Freq: Once | INTRAVENOUS | Status: AC
  Administered 2020-01-20: 6 mg via INTRAVENOUS
  Filled 2020-01-20: qty 2

## 2020-01-20 MED ORDER — HYDROMORPHONE HCL 2 MG PO TABS
2.0000 mg | ORAL_TABLET | ORAL | Status: AC
  Administered 2020-01-20: 2 mg via ORAL
  Filled 2020-01-20: qty 1

## 2020-01-20 MED ORDER — ONDANSETRON 4 MG PO TBDP
4.0000 mg | ORAL_TABLET | Freq: Once | ORAL | Status: AC | PRN
  Administered 2020-01-20: 4 mg via ORAL
  Filled 2020-01-20: qty 1

## 2020-01-20 MED ORDER — MAGNESIUM CITRATE PO SOLN
1.0000 | Freq: Once | ORAL | Status: AC
  Administered 2020-01-20: 1 via ORAL
  Filled 2020-01-20: qty 296

## 2020-01-20 MED ORDER — NAPROXEN 500 MG PO TABS: 500.0000 mg | ORAL_TABLET | Freq: Two times a day (BID) | ORAL | 0 refills | Status: DC

## 2020-01-20 MED ORDER — IOHEXOL 9 MG/ML PO SOLN: 500.0000 mL | Freq: Once | ORAL | Status: DC | PRN

## 2020-01-20 MED ORDER — HYDROMORPHONE HCL 2 MG PO TABS
1.0000 mg | ORAL_TABLET | ORAL | 0 refills | Status: DC | PRN
Start: 2020-01-20 — End: 2020-01-22

## 2020-01-20 MED ORDER — SODIUM CHLORIDE 0.9 % IV BOLUS
1000.0000 mL | Freq: Once | INTRAVENOUS | Status: AC
  Administered 2020-01-20: 1000 mL via INTRAVENOUS

## 2020-01-20 NOTE — ED Triage Notes (Addendum)
Patient c/o pelvic pain that started a few nights ago that has progressively gotten worse. Reports constipation X1 week. Has taken stool softener and miralax at home with no relief. Recnetly had hernia surgery at end of November 2021

## 2020-01-20 NOTE — ED Provider Notes (Signed)
[facility] Emergency Department Provider Note ____________________________________________   First MD Initiated Contact with Patient 01/20/20 1206     (approximate)  I have reviewed the triage vital signs and the nursing notes.  HISTORY  Chief Complaint Abdominal Pain and Constipation   HPI Diana Sexton is a 34 y.o. femalewho presents to the ED for evaluation of abdominal pain, constipation.   Chart review indicates previous laparoscopic cholecystectomy causing incisional ventral hernia, just recently repaired by Dr. Hampton Abbot on 11/23. She followed up as an outpatient on 11/29 with Dr. Hampton Abbot, and noted an erythematous lesion around her incisional sites concerning for surgical site infection versus allergic reaction.  She is provided a prescription of Bactrim to cover infection, and recommended she take Benadryl for the rash. 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  Ureterolithiasis.  Patient presents to the ED with complaints of acute abdominal pain and constipation.  She reports severe abdominal pain that is 10/10 intensity to her suprapubic abdomen and nonradiating.  She reports passing a small bowel movement yesterday that was a small amount of liquid, but no bowel movements since then.  She has not passed any gas today.  She reports taking stool softeners and MiraLAX at home without any resolution of her symptoms.  She reports improving erythema around her surgical incision site with Bactrim, which she has been compliant with.   Past Medical History:  Diagnosis Date  . Anemia   . Anxiety   . Cellulitis 2012   caused stillbirth  . DVT (deep venous thrombosis) ([place]) 06/2011   under right clavicle  . Dysmenorrhea   . Dyspnea   . Family history of breast cancer 11/2013   BRCA/MyRisk neg  . H/O blood clots   . Headache    related to hormonal meds  . History of kidney stones   . History  of prothrombin mutation   . Increased risk of breast cancer 11/2013   IBIS=27%  . Obesity     Patient Active Problem List   Diagnosis Date Noted  . Ventral hernia without obstruction or gangrene   . Sprain of left knee 04/14/2019  . DVT (deep venous thrombosis) ([place]) 02/09/2018  . Menorrhagia with irregular cycle 10/13/2016  . Other irritable bowel syndrome 05/02/2016  . Urinary tract infection 01/21/2015  . Anxiety and depression 01/21/2015  . Nephrolithiasis 01/15/2015  . Abnormal radiologic findings on diagnostic imaging of renal pelvis, ureter, or bladder 01/15/2015  . Low HDL (under 40) 01/01/2015  . Elevated LDL cholesterol level 01/01/2015  . Insomnia 12/16/2014  . Morbid obesity ([place]) 12/11/2014  . Hematuria 12/11/2014  . Preventative health care 12/11/2014  . Low back pain 10/13/2014  . Prothrombin gene mutation ([place]) 10/13/2014  . Hydronephrosis 06/07/2012  . Incomplete emptying of bladder 06/07/2012  . Renal colic 40/98/1191  . Ureteric stone 06/07/2012    Past Surgical History:  Procedure Laterality Date  . CHOLECYSTECTOMY  2007  . CYSTOSCOPY W/ RETROGRADES Left 02/02/2017   Procedure: CYSTOSCOPY WITH RETROGRADE PYELOGRAM;  Surgeon: Abbie Sons, MD;  Location: [facility] ORS;  Service: Urology;  Laterality: Left;  . CYSTOSCOPY W/ RETROGRADES Right 06/26/2019   Procedure: CYSTOSCOPY WITH RETROGRADE PYELOGRAM;  Surgeon: Abbie Sons, MD;  Location: [facility] ORS;  Service: Urology;  Laterality: Right;  . CYSTOSCOPY/URETEROSCOPY/HOLMIUM LASER/STENT PLACEMENT Left 02/02/2017   Procedure: CYSTOSCOPY/URETEROSCOPY/HOLMIUM LASER/STENT PLACEMENT;  Surgeon: Abbie Sons, MD;  Location: [facility] ORS;  Service: Urology;  Laterality: Left;  . CYSTOSCOPY/URETEROSCOPY/HOLMIUM LASER/STENT PLACEMENT Right 06/26/2019   Procedure: CYSTOSCOPY/URETEROSCOPY/HOLMIUM LASER/STENT PLACEMENT;  Surgeon: Abbie Sons, MD;  Location: [facility] OR[facility];  Service: Urology;  Laterality: Right;  .  DILITATION & CURRETTAGE/HYSTROSCOPY WITH ESSURE    . DILITATION & CURRETTAGE/HYSTROSCOPY WITH NOVASURE ABLATION N/A 12/24/2018   Procedure: DILATATION & CURETTAGE/HYSTEROSCOPY WITH ENDOMETRIAL ABLATION (MINERVA SYSTEM);  Surgeon: Will Bonnet, MD;  Location: [facility] OR[facility];  Service: Gynecology;  Laterality: N/A;  . INTRAUTERINE DEVICE (IUD) INSERTION    . LITHOTRIPSY    . TONSILLECTOMY    . TONSILLECTOMY AND ADENOIDECTOMY    . TUBAL LIGATION  2014  . XI ROBOTIC ASSISTED VENTRAL HERNIA N/A 01/06/2020   Procedure: XI ROBOTIC ASSISTED VENTRAL HERNIA;  Surgeon: Olean Ree, MD;  Location: [facility] ORS;  Service: General;  Laterality: N/A;    Prior to Admission medications   Medication Sig Start Date End Date Taking? Authorizing Provider  acetaminophen (TYLENOL) 500 MG tablet Take 1,000 mg by mouth every 6 (six) hours as needed for moderate pain.    [provider]  enoxaparin (LOVENOX) 40 MG/0.4ML injection Inject 0.4 mLs (40 mg total) into the skin every 12 (twelve) hours for 3 days. 01/06/20 01/09/20  Olean Ree, MD  ibuprofen (ADVIL) 200 MG tablet Take 400 mg by mouth every 6 (six) hours as needed for moderate pain.    [provider]  ibuprofen (ADVIL) 600 MG tablet Take 1 tablet (600 mg total) by mouth every 8 (eight) hours as needed for mild pain or moderate pain. 01/06/20   Olean Ree, MD  oxyCODONE (OXY IR/ROXICODONE) 5 MG immediate release tablet Take 1 tablet (5 mg total) by mouth every 4 (four) hours as needed for severe pain. Patient not taking: Reported on 01/12/2020 01/06/20   Olean Ree, MD  sulfamethoxazole-trimethoprim (BACTRIM DS) 800-160 MG tablet Take 1 tablet by mouth 2 (two) times daily for 10 days. 01/12/20 01/22/20  Olean Ree, MD    Allergies Oxycodone and Penicillins  Family History  Problem Relation Age of Onset  . Cancer Mother        breast  . Diabetes Father   . Hypertension Father   . Heart disease Maternal Grandfather   .  Dementia Paternal Grandmother   . Bladder Cancer Neg Hx   . Kidney cancer Neg Hx     Social History Social History   Tobacco Use  . Smoking status: Never Smoker  . Smokeless tobacco: Never Used  Vaping Use  . Vaping Use: Never used  Substance Use Topics  . Alcohol use: Yes    Comment: 1 drink per month  . Drug use: No    Review of Systems  Constitutional: No fever/chills Eyes: No visual changes. ENT: No sore throat. Cardiovascular: Denies chest pain. Respiratory: Denies shortness of breath. Gastrointestinal:  no vomiting.  Positive for abdominal pain, nausea, small-volume diarrhea and constipation Genitourinary: Negative for dysuria. Musculoskeletal: Negative for back pain. Skin: Negative for rash. Neurological: Negative for headaches, focal weakness or numbness.  ____________________________________________   PHYSICAL EXAM:  VITAL SIGNS: Vitals:   01/20/20 1300 01/20/20 1501  BP: 137/74 121/60  Pulse: 77 81  Resp: 18 18  Temp:  98 F (36.7 C)  SpO2: 100% 100%     Constitutional: Alert and oriented.  Tearful and uncomfortable-appearing. Eyes: Conjunctivae are normal. PERRL. EOMI. Head: Atraumatic. Nose: No congestion/rhinnorhea. Mouth/Throat: Mucous membranes are dry.  Oropharynx non-erythematous. Neck: No stridor. No cervical spine tenderness to palpation. Cardiovascular: Normal rate, regular rhythm.  Grossly normal heart sounds.  Good peripheral circulation. Respiratory: Normal respiratory effort.  No retractions. Lungs CTAB. Gastrointestinal: Soft , nondistended. No CVA tenderness. Suprapubic tenderness without peritoneal features. Healing surgical incisions without erythema, induration or purulence.  No dehiscence. Musculoskeletal: No lower extremity tenderness nor edema.  No joint effusions. No signs of acute trauma. Neurologic:  Normal speech and language. No gross focal neurologic deficits are appreciated. No gait instability noted. Skin:  Skin is  warm, dry and intact. No rash noted. Psychiatric: Mood and affect are normal. Speech and behavior are normal.  ____________________________________________   LABS (all labs ordered are listed, but only abnormal results are displayed)  Labs Reviewed  CBC - Abnormal; Notable for the following components:      Result Value   RBC 5.29 (*)    MCV 76.4 (*)    MCH 24.8 (*)    RDW 15.7 (*)    Platelets 409 (*)    All other components within normal limits  URINALYSIS, COMPLETE (UACMP) WITH MICROSCOPIC - Abnormal; Notable for the following components:   Color, Urine STRAW (*)    APPearance CLEAR (*)    Hgb urine dipstick SMALL (*)    Bacteria, UA RARE (*)    All other components within normal limits  LIPASE, BLOOD  COMPREHENSIVE METABOLIC PANEL  POC URINE PREG, ED   ____________________________________________  RADIOLOGY  ED MD interpretation: CT abdomen/pelvis reviewed by me with no evidence of SBO, but with endometrial thickening abnormally.  Official radiology report(s): CT ABDOMEN PELVIS W CONTRAST  Result Date: 01/20/2020 CLINICAL DATA:  Bowel obstruction suspected. Acute abdominal pain. Recent incisional ventral hernia repair on 01/06/2020. History of cholecystectomy. EXAM: CT ABDOMEN AND PELVIS WITH CONTRAST TECHNIQUE: Multidetector CT imaging of the abdomen and pelvis was performed using the standard protocol following bolus administration of intravenous contrast. CONTRAST:  167m OMNIPAQUE IOHEXOL 300 MG/ML  SOLN COMPARISON:  CT renal 12/16/2019 FINDINGS: Lower chest: No acute abnormality. Hepatobiliary: No focal liver abnormality is seen. Status post cholecystectomy. No biliary dilatation. Pancreas: No focal lesion. Normal pancreatic contour. No surrounding inflammatory changes. No main pancreatic ductal dilatation. Spleen: Normal in size without focal abnormality. Adrenals/Urinary Tract: No adrenal nodule bilaterally. Bilateral kidneys enhance symmetrically. No hydronephrosis.  Bilateral calcified stones measuring up to 7 mm on the left and 4 mm on the right. No hydroureter. The urinary bladder is unremarkable. Stomach/Bowel: Stomach is within normal limits. No evidence of bowel wall thickening or dilatation. Appendix appears normal. Vascular/Lymphatic: No abdominal aorta or iliac aneurysm. No abdominal, pelvic, or inguinal lymphadenopathy. Reproductive: Abnormally thickened endometrium measuring up to 450m(normal is up to 1853m The uterus is otherwise unremarkable. There is a 4.7 x 2.9 cm fluid density lesion within the left ovary that likely represents a simple ovarian cyst. Bilateral ovaries and adnexal regions are otherwise unremarkable. Other: Anterior abdomen mesenteric stranding along the hernia repair surgical bed likely representing postsurgical changes. No intraperitoneal free fluid. No intraperitoneal free gas. No organized fluid collection. Musculoskeletal: Difficult to measure 3 x 0.5 x 1 cm fluid density within the subcutaneus soft tissues along the ventral hernia surgical bed likely representing a seroma. No associated peripheral enhancement. No subcutaneus soft tissue abscess or phlegmon formation. No recurrent ventral hernia identified. No suspicious lytic or blastic osseous lesions. No acute displaced fracture. Multilevel degenerative changes of the spine. IMPRESSION: 1. Abnormally thickened endometrium (47 mm). Recommend pelvic ultrasound for further evaluation. 2. A 4.7 cm left ovarian cystic lesion. No follow-up imaging recommended. Note: This recommendation  does not apply to premenarchal patients and to those with increased risk (genetic, family history, elevated tumor markers or other high-risk factors) of ovarian cancer. Reference: JACR 2020 Feb; 17(2):248-254 3. Nonobstructive bilateral nephrolithiasis (up to 7 mm on the left, up to 4 mm on the right). 4. Healing anterior ventral incisional hernia repair with a thin 3 x 0.5 x 1 cm fluid density within the  subcutaneus soft tissues likely representing a seroma (too small for drainage). No evidence of abscess formation. Electronically Signed   By: Iven Finn M.D.   On: 01/20/2020 15:11    ____________________________________________   PROCEDURES and INTERVENTIONS  Procedure(s) performed (including Critical Care):  .1-3 Lead EKG Interpretation Performed by: Vladimir Crofts, MD Authorized by: Vladimir Crofts, MD     Interpretation: normal     ECG rate:  80   ECG rate assessment: normal     Rhythm: sinus rhythm     Ectopy: none     Conduction: normal      Medications  iohexol (OMNIPAQUE) 9 MG/ML oral solution 500 mL (has no administration in time range)  ondansetron (ZOFRAN-ODT) disintegrating tablet 4 mg (4 mg Oral Given 01/20/20 1049)  magnesium citrate solution 1 Bottle (1 Bottle Oral Given 01/20/20 1049)  morphine 4 MG/ML injection 6 mg (6 mg Intravenous Given 01/20/20 1257)  metoCLOPramide (REGLAN) injection 10 mg (10 mg Intravenous Given 01/20/20 1303)  sodium chloride 0.9 % bolus 1,000 mL (0 mLs Intravenous Stopped 01/20/20 1500)  iohexol (OMNIPAQUE) 300 MG/ML solution 125 mL (125 mLs Intravenous Contrast Given 01/20/20 1431)    ____________________________________________   MDM / ED COURSE   34 year old female presents to the ED with acute suprapubic pain initially concerning for SBO, but likely due to uterine pathology.  Normal vitals on room air.  Exam initially with a very uncomfortable and tearful patient due to abdominal pain, and after analgesia she is noted to have suprapubic tenderness without peritoneal features.  No further evidence of acute pathology on examination.  No evidence of postoperative incisional infection from her recent hernia repair.  Blood work is unremarkable.  CT abdomen/pelvis with oral and IV contrast obtained to rule out SBO, and it does not show evidence of such, but it is so thickened and abnormally enlarged endometrium.  This is like the source of her  pain.  We will obtain a pelvic ultrasound to further evaluate for this.  Patient signed out to oncoming provider to facilitate this ultrasound and subsequently referring the patient to OB/GYN, most likely, but dependent upon this study.   Clinical Course as of Jan 20 1523  Tue Jan 20, 2020  1339 Reassessed.  Patient comfortable and reports improved pain after morphine ministration.  Awaiting CT.   [DS]  8756 [id number].  Continues to have controlled pain.  Educated patient on CT results and recommendation for an ultrasound.  She is agreeable to stay for this.  We discussed possible etiologies of endometrial thickening and pain.   [DS]  1523 Patient signed out to oncoming provider, Dr. Joni Fears   [DS]    Clinical Course User Index [DS] Vladimir Crofts, MD    ____________________________________________   FINAL CLINICAL IMPRESSION(S) / ED DIAGNOSES  Final diagnoses:  Suprapubic pain     ED Discharge Orders    None       Rabia Argote Tamala Julian   Note:  This document was prepared using Dragon voice recognition software and may include unintentional dictation errors.   Vladimir Crofts, MD 01/20/20 [phone]

## 2020-01-20 NOTE — ED Provider Notes (Signed)
Procedures  Clinical Course as of Jan 20 1920  Tue Jan 20, 2020  1339 Reassessed.  Patient comfortable and reports improved pain after morphine ministration.  Awaiting CT.   [DS]  8381 [id number].  Continues to have controlled pain.  Educated patient on CT results and recommendation for an ultrasound.  She is agreeable to stay for this.  We discussed possible etiologies of endometrial thickening and pain.   [DS]  1523 Patient signed out to oncoming provider, Dr. Joni Fears   [DS]    Clinical Course User Index [DS] Vladimir Crofts, MD    ----------------------------------------- 7:21 PM on 01/20/2020 ----------------------------------------- Ultrasound shows fluid collection in the uterus consistent with hematometra, especially given patient's ablation history.  Discussed with gynecology who recommends follow-up with Dr. Glennon Mac this week, pain control in the meantime.  Inform patient of the findings, likely need for D&C versus hysterectomy.  She is comfortable with follow-up plan.  Will give Dilaudid tablet prior to discharge for additional pain relief and sent prescriptions electronically to pharmacy.  No signs or symptoms of ovarian torsion currently.    Carrie Mew, MD 01/20/20 [phone]

## 2020-01-20 NOTE — Discharge Instructions (Addendum)
Your CT and Ultrasound show a blood collection in your uterus causing your pain. Please follow up with Dr. Glennon Mac to further evaluate this finding.

## 2020-01-21 ENCOUNTER — Encounter: Payer: Self-pay | Admitting: Surgery

## 2020-01-21 ENCOUNTER — Ambulatory Visit (INDEPENDENT_AMBULATORY_CARE_PROVIDER_SITE_OTHER): Payer: BC Managed Care – PPO | Admitting: Surgery

## 2020-01-21 VITALS — BP 150/86 | HR 79 | Temp 97.8°F | Ht 64.0 in | Wt 307.6 lb

## 2020-01-21 DIAGNOSIS — R21 Rash and other nonspecific skin eruption: Secondary | ICD-10-CM

## 2020-01-21 DIAGNOSIS — Z09 Encounter for follow-up examination after completed treatment for conditions other than malignant neoplasm: Secondary | ICD-10-CM

## 2020-01-21 DIAGNOSIS — K439 Ventral hernia without obstruction or gangrene: Secondary | ICD-10-CM

## 2020-01-21 DIAGNOSIS — T8149XA Infection following a procedure, other surgical site, initial encounter: Secondary | ICD-10-CM

## 2020-01-21 NOTE — Progress Notes (Signed)
01/21/2020  HPI: Diana Sexton is a 34 y.o. female s/p robotic assisted ventral hernia repair with mesh on 11/23.  Patient developed a diffuse body rash and some erythema around the incisions, particularly the RLQ incision.  She was given Bactrim course and recommended Benadryl po and ointment for the rash.  She has been doing well from that standpoint and her incisions and rash have improved.  However, she presented yesterday to the ED due to pelvic pain that she was attributing to constipation.  She had called the office on 12/6 and was recommended Miralax.  In the ED, she had a CT scan which did not show significant stool burdern, but showed a very thickened endometrium with bilateral ovarian cysts.  She has an appointment with Dr. Georgianne Fick tomorrow.  Vital signs: BP (!) 150/86   Pulse 79   Temp 97.8 F (36.6 C) (Oral)   Ht 5\' 4"  (1.626 m)   Wt (!) 307 lb 9.6 oz (139.5 kg)   SpO2 98%   BMI 52.80 kg/m    Physical Exam: Constitutional: No acute distress Abdomen:  Soft, non-distended, non-tender to palpation.  Incisions are healing well, clean, dry, intact.  No further erythema around the incisions.  No evidence of hernia recurrence.  Assessment/Plan: This is a 34 y.o. female s/p robotic assisted ventral hernia repair with mesh.  --Patient has improved from surgical standpoint.  Recommended that she continue taking Miralax at home to stay regular. --Recommended that she keep the appointment with Dr. Georgianne Fick tomorrow.  The mesh, though sutured in place, is still fragile and would need 6 weeks of healing prior to any ports/instrumentation that would go through the mesh.  If possible would be better to have ports outside of the mesh area.  The mesh used measures 11.2 cm in diameter and is centered just to the left of the umbilicus. --Follow up as needed.   Melvyn Neth, [facility]

## 2020-01-21 NOTE — Patient Instructions (Addendum)
Dr Hampton Abbot discussed with patient to continue with Miralax daily to help regulate bowels, if notice it is not helping advise to try over the counter Dulcolax or Colace to prevent and help with constipation.  Patient advised to try Vitamin E oil or moisturizer to help with healing and fading of scarring. Patient advised to continue to apply Benadryl ointment to the area to help with soothing the area.Follow-up with our office as needed. Please call and ask to speak with a nurse if you develop questions or concerns.

## 2020-01-22 ENCOUNTER — Ambulatory Visit (INDEPENDENT_AMBULATORY_CARE_PROVIDER_SITE_OTHER): Payer: BC Managed Care – PPO | Admitting: Obstetrics and Gynecology

## 2020-01-22 ENCOUNTER — Encounter: Payer: Self-pay | Admitting: Obstetrics and Gynecology

## 2020-01-22 ENCOUNTER — Other Ambulatory Visit: Payer: Self-pay

## 2020-01-22 VITALS — BP 135/70 | Ht 65.0 in | Wt 312.0 lb

## 2020-01-22 DIAGNOSIS — N9985 Post endometrial ablation syndrome: Secondary | ICD-10-CM | POA: Diagnosis not present

## 2020-01-22 DIAGNOSIS — N857 Hematometra: Secondary | ICD-10-CM

## 2020-01-22 DIAGNOSIS — R102 Pelvic and perineal pain: Secondary | ICD-10-CM

## 2020-01-22 DIAGNOSIS — N856 Intrauterine synechiae: Secondary | ICD-10-CM | POA: Diagnosis not present

## 2020-01-22 MED ORDER — HYDROMORPHONE HCL 2 MG PO TABS: 1.0000 mg | ORAL_TABLET | ORAL | 0 refills | Status: AC | PRN

## 2020-01-22 NOTE — Progress Notes (Signed)
Gynecology Pelvic Pain Evaluation   Chief Complaint:  Chief Complaint  Patient presents with  . Follow-up    F/U [facility] ED, still having abdominal pain. RM 3    History of Present Illness:   Patient is a 34 y.o. [id number] who LMP was No LMP recorded. Patient has had an ablation., presents today for a problem visit.  She complains of acute onset and worsening pelvic pain with onset of vaginal bleeding in the context of prior [place] endometrial ablation by Prentice Docker MD 12/24/2018.   Her pain is localized to the suprapubic area, described as intermittent and cramping, began several days ago and its severity is described as severe. The pain radiates to the back, groin. She has these associated symptoms which include abdominal pain and nausea. Patient has these modifiers which include pain medication that make it better and unable to associate with any factor that make it worse.    Previous evaluation: emergency room visit on 01/20/2020. Prior Diagnosis: hematometra . Previous Treatment: dilaudid.  Review of Systems: Review of Systems  Constitutional: Negative.   Gastrointestinal: Positive for abdominal pain. Negative for blood in stool, constipation, diarrhea, melena and vomiting.  Genitourinary: Negative.     Past Medical History:  Patient Active Problem List   Diagnosis Date Noted  . Ventral hernia without obstruction or gangrene   . Sprain of left knee 04/14/2019  . DVT (deep venous thrombosis) ([place]) 02/09/2018  . Menorrhagia with irregular cycle 10/13/2016  . Other irritable bowel syndrome 05/02/2016  . Urinary tract infection 01/21/2015  . Anxiety and depression 01/21/2015  . Nephrolithiasis 01/15/2015    Numerous stones on CT scan Dec 25, 2014   . Abnormal radiologic findings on diagnostic imaging of renal pelvis, ureter, or bladder 01/15/2015    See CT scan Dec 25, 2014   . Low HDL (under 40) 01/01/2015  . Elevated LDL cholesterol level 01/01/2015  . Insomnia  12/16/2014  . Morbid obesity ([place]) 12/11/2014  . Hematuria 12/11/2014  . Preventative health care 12/11/2014  . Low back pain 10/13/2014  . Prothrombin gene mutation ([place]) 10/13/2014  . Hydronephrosis 06/07/2012  . Incomplete emptying of bladder 06/07/2012  . Renal colic 25/85/2778  . Ureteric stone 06/07/2012    Past Surgical History:  Past Surgical History:  Procedure Laterality Date  . CHOLECYSTECTOMY  2007  . CYSTOSCOPY W/ RETROGRADES Left 02/02/2017   Procedure: CYSTOSCOPY WITH RETROGRADE PYELOGRAM;  Surgeon: Abbie Sons, MD;  Location: [facility] ORS;  Service: Urology;  Laterality: Left;  . CYSTOSCOPY W/ RETROGRADES Right 06/26/2019   Procedure: CYSTOSCOPY WITH RETROGRADE PYELOGRAM;  Surgeon: Abbie Sons, MD;  Location: [facility] ORS;  Service: Urology;  Laterality: Right;  . CYSTOSCOPY/URETEROSCOPY/HOLMIUM LASER/STENT PLACEMENT Left 02/02/2017   Procedure: CYSTOSCOPY/URETEROSCOPY/HOLMIUM LASER/STENT PLACEMENT;  Surgeon: Abbie Sons, MD;  Location: [facility] ORS;  Service: Urology;  Laterality: Left;  . CYSTOSCOPY/URETEROSCOPY/HOLMIUM LASER/STENT PLACEMENT Right 06/26/2019   Procedure: CYSTOSCOPY/URETEROSCOPY/HOLMIUM LASER/STENT PLACEMENT;  Surgeon: Abbie Sons, MD;  Location: [facility] ORS;  Service: Urology;  Laterality: Right;  . DILITATION & CURRETTAGE/HYSTROSCOPY WITH ESSURE    . DILITATION & CURRETTAGE/HYSTROSCOPY WITH NOVASURE ABLATION N/A 12/24/2018   Procedure: DILATATION & CURETTAGE/HYSTEROSCOPY WITH ENDOMETRIAL ABLATION (MINERVA SYSTEM);  Surgeon: Will Bonnet, MD;  Location: [facility] ORS;  Service: Gynecology;  Laterality: N/A;  . INTRAUTERINE DEVICE (IUD) INSERTION    . LITHOTRIPSY    . TONSILLECTOMY    . TONSILLECTOMY AND ADENOIDECTOMY    . TUBAL LIGATION  2014  . XI  ROBOTIC ASSISTED VENTRAL HERNIA N/A 01/06/2020   Procedure: XI ROBOTIC ASSISTED VENTRAL HERNIA;  Surgeon: Olean Ree, MD;  Location: [facility] ORS;  Service: General;  Laterality: N/A;    Gynecologic  History:  No LMP recorded. Patient has had an ablation.  Obstetric History: [id number]  Family History:  Family History  Problem Relation Age of Onset  . Cancer Mother        breast  . Diabetes Father   . Hypertension Father   . Heart disease Maternal Grandfather   . Dementia Paternal Grandmother   . Bladder Cancer Neg Hx   . Kidney cancer Neg Hx     Social History:  Social History   Socioeconomic History  . Marital status: Married    Spouse name: Janace Litten  . Number of children: 2  . Years of education: Not on file  . Highest education level: Not on file  Occupational History  . Not on file  Tobacco Use  . Smoking status: Never Smoker  . Smokeless tobacco: Never Used  Vaping Use  . Vaping Use: Never used  Substance and Sexual Activity  . Alcohol use: Yes    Comment: 1 drink per month  . Drug use: No  . Sexual activity: Yes    Birth control/protection: Other-see comments, Surgical    Comment: tubal ligation  Other Topics Concern  . Not on file  Social History Narrative   Lives with husband.   Social Determinants of Health   Financial Resource Strain: Not on file  Food Insecurity: Not on file  Transportation Needs: Not on file  Physical Activity: Not on file  Stress: Not on file  Social Connections: Not on file  Intimate Partner Violence: Not on file    Allergies:  Allergies  Allergen Reactions  . Oxycodone   . Penicillins Rash    Has had a PCN reaction causing, facial/tongue/throat swelling, SOB or lightheadedness with hypotension: NO Has had a PCN reaction causing severe rash involving mucus membranes or skin necrosis: NO Has had a PCN reaction that required hospitalization: No Has had a PCN reaction occurring within the last 10 years: Yes If all of the above answers are "NO", then may proceed with Cephalosporin use. Updated 01/06/20; Reaction was low severity rash. Ancef given. No reaction     Medications: Prior to Admission medications   Medication  Sig Start Date End Date Taking? Authorizing Provider  acetaminophen (TYLENOL) 500 MG tablet Take 1,000 mg by mouth every 6 (six) hours as needed for moderate pain.   Yes [provider]  ibuprofen (ADVIL) 600 MG tablet Take 1 tablet (600 mg total) by mouth every 8 (eight) hours as needed for mild pain or moderate pain. 01/06/20  Yes Piscoya, Jacqulyn Bath, MD  naproxen (NAPROSYN) 500 MG tablet Take 1 tablet (500 mg total) by mouth 2 (two) times daily with a meal. 01/20/20  Yes Carrie Mew, MD  sulfamethoxazole-trimethoprim (BACTRIM DS) 800-160 MG tablet Take 1 tablet by mouth 2 (two) times daily for 10 days. 01/12/20 01/22/20 Yes Piscoya, Jacqulyn Bath, MD  enoxaparin (LOVENOX) 40 MG/0.4ML injection Inject 0.4 mLs (40 mg total) into the skin every 12 (twelve) hours for 3 days. 01/06/20 01/09/20  Olean Ree, MD  HYDROmorphone (DILAUDID) 2 MG tablet Take 0.5 tablets (1 mg total) by mouth every 4 (four) hours as needed for up to 5 days for severe pain. 01/22/20 01/27/20  Malachy Mood, MD  ibuprofen (ADVIL) 200 MG tablet Take 400 mg by mouth every 6 (six) hours as  needed for moderate pain.    [provider]    Physical Exam Vitals: Blood pressure 135/70, height 5\' 5"  (1.651 m), weight (!) 312 lb (141.5 kg).  General: NAD, obese, appears stated age 73: normocephalic, anicteric Pulmonary: No increased work of breathing Abdomen: soft, non-tender, non-distended.  Genitourinary:  External: Normal external female genitalia.  Normal urethral meatus, normal  Bartholin's and Skene's glands.    Vagina: Normal vaginal mucosa, no evidence of prolapse.    Cervix: Grossly normal in appearance, no bleeding  Uterus: Non-enlarged, mobile, normal contour. Tender on palpation  Adnexa: ovaries non-enlarged, no adnexal masses  Rectal: deferred  Lymphatic: no evidence of inguinal lymphadenopathy Extremities: no edema, erythema, or tenderness Neurologic: Grossly intact Psychiatric: mood appropriate,  affect full  Attempt at dilating in office by passing a [place] was unsuccessful secondary to patient discomfort  Female chaperone present for pelvic portion of the physical exam  Assessment: 34 y.o. E7O3500 with hematometra  Problem List Items Addressed This Visit   None   Visit Diagnoses    Hematometra    -  Primary   Acute pelvic pain, female       Post endometrial ablation syndrome       Uterine synechiae           1) Hematometra - will post for hysteroscopy, D&C in order to attempt evacuation and provide relief.  Long term may required hysterectomy if this is unsuccessful.    - Prescription drug database was reviewed, UDS was not ordered - Transvaginal ultrasound reviewed - Blood work obtained today No  - Cervical cultures No - UA reviewed  2) A total of 15 minutes were spent in face-to-face contact with the patient during this encounter with over half of that time devoted to counseling and coordination of care.  3) Return will be contacted with surgery date.   Malachy Mood, MD, [facility], [facility] 01/22/2020, 12:51 PM

## 2020-01-22 NOTE — Progress Notes (Signed)
F/U [facility] ED, still having abdominal pain. RM 3

## 2020-01-23 ENCOUNTER — Telehealth: Payer: Self-pay | Admitting: Obstetrics and Gynecology

## 2020-01-23 ENCOUNTER — Encounter: Payer: BC Managed Care – PPO | Admitting: Surgery

## 2020-01-23 ENCOUNTER — Other Ambulatory Visit: Payer: BC Managed Care – PPO

## 2020-01-23 NOTE — Telephone Encounter (Signed)
-----   Message from Malachy Mood, MD sent at 01/23/2020  8:41 AM EST ----- Regarding: Surgery Surgery Booking Request Patient Full Name:  Diana Sexton  MRN: [id number]  DOB: [birth year]  Surgeon: Glennon Mac Requested Surgery Date and Time: 01/27/20 Primary Diagnosis AND Code: Hematometra Secondary Diagnosis and Code: Pelvic pain Surgical Procedure: Hysteroscopy D&C RNFA Requested?: No L&D Notification: No Admission Status: same day surgery Length of Surgery: 50 min Special Case Needs: No H&P: No Phone Interview???:  Yes Interpreter: No Medical Clearance:  No Special Scheduling Instructions: No Any known health/anesthesia issues, diabetes, sleep apnea, latex allergy, defibrillator/pacemaker?: No Acuity: P2   (P1 highest, P2 delay may cause harm, P3 low, elective gyn, P4 lowest)

## 2020-01-23 NOTE — Telephone Encounter (Signed)
Patient called to verify Hysterscopy D&C w Georgianne Fick  DOS 12/14  H&P on DOS  Covid testing 12/13 @ 8-10:30, [address], drive up and wear mask. Advised pt to quarantine until DOS.  Pre-admit phone call appointment 12/13 @ [place] that pt may also receive calls from the hospital pharmacy and pre-service center.  Confirmed pt has [employer] as Chartered certified accountant. No secondary insurance.

## 2020-01-26 ENCOUNTER — Emergency Department
Admission: EM | Admit: 2020-01-26 | Discharge: 2020-01-26 | Disposition: A | Discharge status: Home / Self Care | Payer: BC Managed Care – PPO | Attending: Emergency Medicine | Admitting: Emergency Medicine

## 2020-01-26 ENCOUNTER — Encounter
Admission: RE | Admit: 2020-01-26 | Discharge: 2020-01-26 | Disposition: A | Discharge status: Home / Self Care | Payer: BC Managed Care – PPO | Source: Ambulatory Visit | Attending: Obstetrics and Gynecology | Admitting: Obstetrics and Gynecology

## 2020-01-26 ENCOUNTER — Other Ambulatory Visit: Payer: Self-pay

## 2020-01-26 ENCOUNTER — Other Ambulatory Visit: Admission: RE | Admit: 2020-01-26 | Payer: BC Managed Care – PPO | Source: Ambulatory Visit

## 2020-01-26 ENCOUNTER — Telehealth: Payer: Self-pay | Admitting: Obstetrics and Gynecology

## 2020-01-26 ENCOUNTER — Encounter: Payer: Self-pay | Admitting: Emergency Medicine

## 2020-01-26 DIAGNOSIS — Z20822 Contact with and (suspected) exposure to covid-19: Secondary | ICD-10-CM | POA: Insufficient documentation

## 2020-01-26 DIAGNOSIS — R102 Pelvic and perineal pain: Secondary | ICD-10-CM

## 2020-01-26 LAB — RESP PANEL BY RT-PCR (FLU A&B, COVID) ARPGX2
Influenza A by PCR: NEGATIVE
Influenza B by PCR: NEGATIVE
SARS Coronavirus 2 by RT PCR: NEGATIVE

## 2020-01-26 LAB — COMPREHENSIVE METABOLIC PANEL
ALT: 15 U/L (ref 0–44)
AST: 18 U/L (ref 15–41)
Albumin: 3.7 g/dL (ref 3.5–5.0)
Alkaline Phosphatase: 66 U/L (ref 38–126)
Anion gap: 10 (ref 5–15)
BUN: 16 mg/dL (ref 6–20)
CO2: 25 mmol/L (ref 22–32)
Calcium: 9.3 mg/dL (ref 8.9–10.3)
Chloride: 104 mmol/L (ref 98–111)
Creatinine, Ser: 0.62 mg/dL (ref 0.44–1.00)
GFR, Estimated: >60 mL/min (ref >60)
Glucose, Bld: 109 mg/dL — ABNORMAL HIGH (ref 70–99)
Potassium: 4.1 mmol/L (ref 3.5–5.1)
Sodium: 139 mmol/L (ref 135–145)
Total Bilirubin: 0.6 mg/dL (ref 0.3–1.2)
Total Protein: 7.7 g/dL (ref 6.5–8.1)

## 2020-01-26 LAB — LIPASE, BLOOD: Lipase: 20 U/L (ref 11–51)

## 2020-01-26 LAB — CBC
HCT: 38.3 % (ref 36.0–46.0)
Hemoglobin: 12.6 g/dL (ref 12.0–15.0)
MCH: 25.3 pg — ABNORMAL LOW (ref 26.0–34.0)
MCHC: 32.9 g/dL (ref 30.0–36.0)
MCV: 76.8 fL — ABNORMAL LOW (ref 80.0–100.0)
Platelets: 400 10*3/uL (ref 150–400)
RBC: 4.99 MIL/uL (ref 3.87–5.11)
RDW: 16 % — ABNORMAL HIGH (ref 11.5–15.5)
WBC: 8.6 10*3/uL (ref 4.0–10.5)
nRBC: 0 % (ref 0.0–0.2)

## 2020-01-26 MED ORDER — FENTANYL CITRATE (PF) 100 MCG/2ML IJ SOLN
50.0000 ug | INTRAMUSCULAR | Status: DC | PRN
  Administered 2020-01-26: 50 ug via INTRAVENOUS
  Filled 2020-01-26: qty 2

## 2020-01-26 MED ORDER — SODIUM CHLORIDE 0.9 % IV BOLUS
1000.0000 mL | Freq: Once | INTRAVENOUS | Status: AC
  Administered 2020-01-26: 08:00:00 1000 mL via INTRAVENOUS

## 2020-01-26 MED ORDER — ONDANSETRON HCL 4 MG/2ML IJ SOLN
4.0000 mg | Freq: Once | INTRAMUSCULAR | Status: AC
  Administered 2020-01-26: 08:00:00 4 mg via INTRAVENOUS
  Filled 2020-01-26: qty 2

## 2020-01-26 MED ORDER — HYDROMORPHONE HCL 1 MG/ML IJ SOLN
0.5000 mg | Freq: Once | INTRAMUSCULAR | Status: AC
  Administered 2020-01-26: 10:00:00 0.5 mg via INTRAVENOUS
  Filled 2020-01-26: qty 1

## 2020-01-26 MED ORDER — ONDANSETRON HCL 4 MG/2ML IJ SOLN
4.0000 mg | Freq: Once | INTRAMUSCULAR | Status: AC
  Administered 2020-01-26: 07:00:00 4 mg via INTRAVENOUS
  Filled 2020-01-26: qty 2

## 2020-01-26 MED ORDER — HYDROMORPHONE HCL 1 MG/ML IJ SOLN
1.0000 mg | Freq: Once | INTRAMUSCULAR | Status: AC
  Administered 2020-01-26: 08:00:00 1 mg via INTRAVENOUS
  Filled 2020-01-26: qty 1

## 2020-01-26 NOTE — ED Triage Notes (Signed)
Patient states that she is having lower abdominal pain that woke her up at 04:00 this morning. Patient states that she is having nausea. Patient states that she took hydrocodone for the pain this morning with no relief. Patient is scheduled for a hysteroscopy tomorrow.

## 2020-01-26 NOTE — Pre-Procedure Instructions (Signed)
Pre-Admit Testing Provider Notification Note  Provider Notified: Dr. Georgianne Fick and Dr. Glennon Mac  Notification Mode: Secure Chart  Reason: Previous anticoagulation recommendations from hematology.  Re: Anticoagulation plans for upcoming surgery.  Patient was scheduled for robotic assisted ventral hernia repair on 1123 with Dr. Ardath Sax.  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  Patient has had a total of 2 DVTs in her life; one provoked by pregnancy, and the other unprovoked.  Hypercoagulable work-up significant for heterozygous prothrombin gene mutation, which places the patient at a 2-3 times risk of developing VTE as compared to the general population.  Patient currently followed by hematology [name], MD).  Patient has not been seen in clinic since 07/2018 at which time patient had self discontinued her apixaban therapy 1 month prior citing financial concerns.  Extended conversation held with patient by hematologist regarding increased risk for DVT due to her prothrombin gene mutation, however patient was adamant about discontinuing anticoagulant.  Hematologist discussed that development of a subsequent thromboses  would necessitate lifelong anticoagulation; patient verbalized understanding.  Regarding her upcoming procedure with Dr. Hampton Abbot, hematology issued clearance at a MODERATE risk stratification.  Dr. [name] unsure if patient is currently taking oral anticoagulant, and notes that if she is not he recommends 2 to 3 days of postoperative enoxaparin twice daily.  Surgeons office is aware of recommendations.  Honor Loh, MSN, APRN, FNP-C, CEN [facility]  Peri-operative Services Nurse Practitioner Phone: [phone] 01/05/20 11:16 AM  Response: Read by Dr. Georgianne Fick.  Additional Information:  Signed: Beulah Gandy, RN

## 2020-01-26 NOTE — Patient Instructions (Signed)
Your procedure is scheduled on: Tuesday 01/27/20.  Report to THE FIRST FLOOR REGISTRATION DESK IN THE MEDICAL MALL ON THE MORNING OF SURGERY FIRST, THEN YOU WILL CHECK IN AT THE SURGERY INFORMATION DESK LOCATED OUTSIDE THE SAME DAY SURGERY DEPARTMENT LOCATED ON 2ND FLOOR MEDICAL MALL ENTRANCE.  To find out your arrival time please call [phone] between 1PM - 3PM today. They will likely call you first, but if you haven't heard anything by 3:00, call them.   Remember: Instructions that are not followed completely may result in serious medical risk, up to and including death, or upon the discretion of your surgeon and anesthesiologist your surgery may need to be rescheduled.     __X__ 1. Do not eat food after midnight the night before your procedure.                 No gum chewing or hard candies. You may drink clear liquids up to 2 hours                 before you are scheduled to arrive for your surgery- DO NOT drink clear                 liquids within 2 hours of the start of your surgery.                 Clear Liquids include:  water, apple juice without pulp, clear carbohydrate                 drink such as Clearfast or Gatorade, Black Coffee or Tea (Do not add                 milk or creamer to coffee or tea).  __X__2.  On the morning of surgery brush your teeth with toothpaste and water, you may rinse your mouth with mouthwash if you wish.  Do not swallow any toothpaste or mouthwash.    __X__ 3.  No Alcohol for 24 hours before or after surgery.  __X__ 4.  Do Not Smoke or use e-cigarettes For 24 Hours Prior to Your Surgery.                 Do not use any chewable tobacco products for at least 6 hours prior to                 surgery.  __X__5.  Notify your doctor if there is any change in your medical condition      (cold, fever, infections).      Do NOT wear jewelry, make-up, hairpins, clips or nail polish. Do NOT wear lotions, powders, or perfumes.  Do NOT shave 48 hours  prior to surgery. Men may shave face and neck. Do NOT bring valuables to the hospital.     [facility] is not responsible for any belongings or valuables.   Contacts, dentures/partials or body piercings may not be worn into surgery. Bring a case for your contacts, glasses or hearing aids, a denture cup will be supplied.    Patients discharged the day of surgery will not be allowed to drive home.     __X__ Take these medicines the morning of surgery with A SIP OF WATER:     1. acetaminophen (TYLENOL) if needed  2. HYDROmorphone (DILAUDID) if needed    __X__ Use CHG Soap/SAGE wipes as directed  __X__ Stop Anti-inflammatories 7 days before surgery such as Advil, Ibuprofen, Motrin, BC or Goodies Powder, Naprosyn, Naproxen, Aleve,  Aspirin, Meloxicam. May take Tylenol and Dilaudid if needed for pain or discomfort.   __X__Do not start taking any new herbal supplements or vitamins prior to your procedure.     Wear comfortable clothing (specific to your surgery type) to the hospital.  Plan for stool softeners for home use; pain medications have a tendency to cause constipation. You can also help prevent constipation by eating foods high in fiber such as fruits and vegetables and drinking plenty of fluids as your diet allows.  After surgery, you can prevent lung complications by doing breathing exercises.Take deep breaths and cough every 1-2 hours. Your doctor may order a device called an Incentive Spirometer to help you take deep breaths.  Please call the [place] Department at [phone] if you have any questions about these instructions.

## 2020-01-26 NOTE — ED Provider Notes (Signed)
[facility] Emergency Department Provider Note  Time seen: 7:44 AM  I have reviewed the triage vital signs and the nursing notes.   HISTORY  Chief Complaint Abdominal Pain   HPI Diana Sexton is a 34 y.o. female with a past medical history of anemia, dysmenorrhea, pelvic pain presents to the emergency department for worsening pelvic pain.  According to the patient little over a year ago she was experiencing similar pelvic pain requiring an endometrial ablation.  Over the past several weeks the patient has began experiencing significant lower abdominal pain once again has been following up with [facility] had an abnormal ultrasound several days ago and is scheduled for hysteroscopy tomorrow.  Patient has been taking her home pain medication but states the pain worsened this morning so she came to the emergency department.  Denies any fever.  Denies any vaginal bleeding or discharge.  States nausea but denies vomiting.  Describes her pain as severe aching pain in the very low abdomen/pelvis.   Past Medical History:  Diagnosis Date  . Anemia   . Anxiety   . Cellulitis 2012   caused stillbirth  . DVT (deep venous thrombosis) ([place]) 06/2011   under right clavicle  . Dysmenorrhea   . Dyspnea   . Family history of breast cancer 11/2013   BRCA/MyRisk neg  . H/O blood clots   . Headache    related to hormonal meds  . History of kidney stones   . History of prothrombin mutation   . Increased risk of breast cancer 11/2013   IBIS=27%  . Obesity     Patient Active Problem List   Diagnosis Date Noted  . Ventral hernia without obstruction or gangrene   . Sprain of left knee 04/14/2019  . DVT (deep venous thrombosis) ([place]) 02/09/2018  . Menorrhagia with irregular cycle 10/13/2016  . Other irritable bowel syndrome 05/02/2016  . Urinary tract infection 01/21/2015  . Anxiety and depression 01/21/2015  . Nephrolithiasis 01/15/2015  . Abnormal radiologic  findings on diagnostic imaging of renal pelvis, ureter, or bladder 01/15/2015  . Low HDL (under 40) 01/01/2015  . Elevated LDL cholesterol level 01/01/2015  . Insomnia 12/16/2014  . Morbid obesity ([place]) 12/11/2014  . Hematuria 12/11/2014  . Preventative health care 12/11/2014  . Low back pain 10/13/2014  . Prothrombin gene mutation ([place]) 10/13/2014  . Hydronephrosis 06/07/2012  . Incomplete emptying of bladder 06/07/2012  . Renal colic 97/58/8325  . Ureteric stone 06/07/2012    Past Surgical History:  Procedure Laterality Date  . CHOLECYSTECTOMY  2007  . CYSTOSCOPY W/ RETROGRADES Left 02/02/2017   Procedure: CYSTOSCOPY WITH RETROGRADE PYELOGRAM;  Surgeon: Abbie Sons, MD;  Location: [facility] ORS;  Service: Urology;  Laterality: Left;  . CYSTOSCOPY W/ RETROGRADES Right 06/26/2019   Procedure: CYSTOSCOPY WITH RETROGRADE PYELOGRAM;  Surgeon: Abbie Sons, MD;  Location: [facility] ORS;  Service: Urology;  Laterality: Right;  . CYSTOSCOPY/URETEROSCOPY/HOLMIUM LASER/STENT PLACEMENT Left 02/02/2017   Procedure: CYSTOSCOPY/URETEROSCOPY/HOLMIUM LASER/STENT PLACEMENT;  Surgeon: Abbie Sons, MD;  Location: [facility] ORS;  Service: Urology;  Laterality: Left;  . CYSTOSCOPY/URETEROSCOPY/HOLMIUM LASER/STENT PLACEMENT Right 06/26/2019   Procedure: CYSTOSCOPY/URETEROSCOPY/HOLMIUM LASER/STENT PLACEMENT;  Surgeon: Abbie Sons, MD;  Location: [facility] ORS;  Service: Urology;  Laterality: Right;  . DILITATION & CURRETTAGE/HYSTROSCOPY WITH ESSURE    . DILITATION & CURRETTAGE/HYSTROSCOPY WITH NOVASURE ABLATION N/A 12/24/2018   Procedure: DILATATION & CURETTAGE/HYSTEROSCOPY WITH ENDOMETRIAL ABLATION (MINERVA SYSTEM);  Surgeon: Will Bonnet, MD;  Location: [facility] ORS;  Service:  Gynecology;  Laterality: N/A;  . INTRAUTERINE DEVICE (IUD) INSERTION    . LITHOTRIPSY    . TONSILLECTOMY    . TONSILLECTOMY AND ADENOIDECTOMY    . TUBAL LIGATION  2014  . XI ROBOTIC ASSISTED VENTRAL HERNIA N/A 01/06/2020    Procedure: XI ROBOTIC ASSISTED VENTRAL HERNIA;  Surgeon: Olean Ree, MD;  Location: [facility] ORS;  Service: General;  Laterality: N/A;    Prior to Admission medications   Medication Sig Start Date End Date Taking? Authorizing Provider  acetaminophen (TYLENOL) 500 MG tablet Take 1,000 mg by mouth every 6 (six) hours as needed for moderate pain.    [provider]  diphenhydrAMINE (BENADRYL) 2 % cream Apply topically 3 (three) times daily as needed for itching.    [provider]  enoxaparin (LOVENOX) 40 MG/0.4ML injection Inject 0.4 mLs (40 mg total) into the skin every 12 (twelve) hours for 3 days. Patient not taking: Reported on 01/23/2020 01/06/20 01/09/20  Olean Ree, MD  HYDROmorphone (DILAUDID) 2 MG tablet Take 0.5 tablets (1 mg total) by mouth every 4 (four) hours as needed for up to 5 days for severe pain. 01/22/20 01/27/20  Malachy Mood, MD  ibuprofen (ADVIL) 600 MG tablet Take 1 tablet (600 mg total) by mouth every 8 (eight) hours as needed for mild pain or moderate pain. Patient not taking: Reported on 01/23/2020 01/06/20   Olean Ree, MD  naproxen (NAPROSYN) 500 MG tablet Take 1 tablet (500 mg total) by mouth 2 (two) times daily with a meal. 01/20/20   Carrie Mew, MD    Allergies  Allergen Reactions  . Oxycodone Rash  . Penicillins Rash    Has had a PCN reaction causing, facial/tongue/throat swelling, SOB or lightheadedness with hypotension: NO Has had a PCN reaction causing severe rash involving mucus membranes or skin necrosis: NO Has had a PCN reaction that required hospitalization: No Has had a PCN reaction occurring within the last 10 years: Yes If all of the above answers are "NO", then may proceed with Cephalosporin use. Updated 01/06/20; Reaction was low severity rash. Ancef given. No reaction     Family History  Problem Relation Age of Onset  . Cancer Mother        breast  . Diabetes Father   . Hypertension Father   . Heart  disease Maternal Grandfather   . Dementia Paternal Grandmother   . Bladder Cancer Neg Hx   . Kidney cancer Neg Hx     Social History Social History   Tobacco Use  . Smoking status: Never Smoker  . Smokeless tobacco: Never Used  Vaping Use  . Vaping Use: Never used  Substance Use Topics  . Alcohol use: Yes    Comment: 1 drink per month  . Drug use: No    Review of Systems Constitutional: Negative for fever. Cardiovascular: Negative for chest pain. Respiratory: Negative for shortness of breath. Gastrointestinal: Positive for pelvic pain.  Positive for nausea but negative for vomiting or diarrhea Genitourinary: Negative for urinary compaints Musculoskeletal: Negative for musculoskeletal complaints Neurological: Negative for headache All other ROS negative  ____________________________________________   PHYSICAL EXAM:  VITAL SIGNS: ED Triage Vitals [01/26/20 0619]  Enc Vitals Group     BP 133/71     Pulse Rate 91     Resp 20     Temp 98 F (36.7 C)     Temp Source Oral     SpO2 100 %     Weight 300 lb (136.1 kg)  Height _0  (1.651 m)     Head Circumference      Peak Flow      Pain Score 10     Pain Loc      Pain Edu?      Excl. in [place]?    Constitutional: Alert and oriented. Well appearing and appears to be in mild discomfort no acute distress. Eyes: Normal exam ENT      Head: Normocephalic and atraumatic.      Mouth/Throat: Mucous membranes are moist. Cardiovascular: Normal rate, regular rhythm. Respiratory: Normal respiratory effort without tachypnea nor retractions. Breath sounds are clear  Gastrointestinal: Soft, suprapubic tenderness, otherwise benign abdomen without rebound guarding or distention Musculoskeletal: Nontender with normal range of motion in all extremities.  Neurologic:  Normal speech and language. No gross focal neurologic deficits  Skin:  Skin is warm, dry and intact.  Psychiatric: Mood and affect are normal. Speech and behavior are  normal.   ____________________________________________  INITIAL IMPRESSION / ASSESSMENT AND PLAN / ED COURSE  Pertinent labs & imaging results that were available during my care of the patient were reviewed by me and considered in my medical decision making (see chart for details).   Patient presents emergency department for lower abdominal pain.  States this feels identical to last time she required an endometrial ablation.  Has a hysteroscopy scheduled tomorrow for the same pain/issue that she has been experiencing.  Patient does report recent hernia surgery but states she recovered from that prior to this pain beginning.  Has been following up with [facility].  Patient's lab work is nonrevealing, vitals are reassuring exam is reassuring.  We will attempt to control the patient's pain.  As long as the patient's pain can be controlled I would anticipate discharge home with OB procedure scheduled tomorrow.  Patient states her pain is down to a 6/10.  We will dose a half dose of pain medication for the patient.  Patient states she is feeling much better and believes she will be able to go home.  I discussed with the patient to continue her home pain medication and follow-up with OB tomorrow as scheduled.  Patient's Covid test is negative.  Diana Sexton was evaluated in Emergency Department on 01/26/2020 for the symptoms described in the history of present illness. She was evaluated in the context of the global COVID-19 pandemic, which necessitated consideration that the patient might be at risk for infection with the SARS-CoV-2 virus that causes COVID-19. Institutional protocols and algorithms that pertain to the evaluation of patients at risk for COVID-19 are in a state of rapid change based on information released by regulatory bodies including the CDC and federal and state organizations. These policies and algorithms were followed during the patient's care in the  ED.  ____________________________________________   FINAL CLINICAL IMPRESSION(S) / ED DIAGNOSES  Pelvic pain   Harvest Dark, MD 01/26/20 1014

## 2020-01-26 NOTE — ED Notes (Signed)
Patient verbalizes understanding of discharge instructions. Opportunity for questioning and answers were provided. Armband removed by staff, pt discharged from ED. Ambulated out to lobby  

## 2020-01-26 NOTE — ED Triage Notes (Signed)
FIRST NURSE NOTE:  Pt here with lower abd pain states she is due to have surgery for the same pain tomorrow (hysteroscopy). Pt reports she called her MD and was referred to ED.  Pt reports the pain started at 4am and woke her from her sleep.

## 2020-01-26 NOTE — Telephone Encounter (Signed)
Returned patient phone call regarding pain. She is taking Dilaudid 1 mg PO.  This helps sometimes and sometimes not. She is doing well now.  Informed her she could take up to 2mg  every 6 hours between this evening and tomorrow.  She voiced understanding and appreciation for the call.

## 2020-01-27 ENCOUNTER — Ambulatory Visit: Payer: BC Managed Care – PPO | Admitting: Urgent Care

## 2020-01-27 ENCOUNTER — Encounter: Payer: Self-pay | Admitting: Obstetrics and Gynecology

## 2020-01-27 ENCOUNTER — Encounter
Admission: RE | Disposition: A | Discharge status: Home / Self Care | Payer: Self-pay | Source: Home / Self Care | Attending: Obstetrics and Gynecology

## 2020-01-27 ENCOUNTER — Ambulatory Visit
Admission: RE | Admit: 2020-01-27 | Discharge: 2020-01-27 | Disposition: A | Discharge status: Home / Self Care | Payer: BC Managed Care – PPO | Attending: Obstetrics and Gynecology | Admitting: Obstetrics and Gynecology

## 2020-01-27 ENCOUNTER — Other Ambulatory Visit: Payer: Self-pay

## 2020-01-27 DIAGNOSIS — Z791 Long term (current) use of non-steroidal anti-inflammatories (NSAID): Secondary | ICD-10-CM | POA: Diagnosis not present

## 2020-01-27 DIAGNOSIS — R102 Pelvic and perineal pain: Secondary | ICD-10-CM | POA: Diagnosis present

## 2020-01-27 DIAGNOSIS — N857 Hematometra: Secondary | ICD-10-CM | POA: Diagnosis present

## 2020-01-27 DIAGNOSIS — N9985 Post endometrial ablation syndrome: Secondary | ICD-10-CM | POA: Diagnosis not present

## 2020-01-27 DIAGNOSIS — D6852 Prothrombin gene mutation: Secondary | ICD-10-CM

## 2020-01-27 DIAGNOSIS — Z86718 Personal history of other venous thrombosis and embolism: Secondary | ICD-10-CM | POA: Diagnosis not present

## 2020-01-27 DIAGNOSIS — N856 Intrauterine synechiae: Secondary | ICD-10-CM | POA: Diagnosis not present

## 2020-01-27 DIAGNOSIS — K439 Ventral hernia without obstruction or gangrene: Secondary | ICD-10-CM

## 2020-01-27 HISTORY — PX: HYSTEROSCOPY WITH D & C: SHX1775

## 2020-01-27 LAB — POCT PREGNANCY, URINE: Preg Test, Ur: NEGATIVE

## 2020-01-27 SURGERY — DILATATION AND CURETTAGE /HYSTEROSCOPY
Anesthesia: General | Site: "Vagina "

## 2020-01-27 MED ORDER — DEXAMETHASONE SODIUM PHOSPHATE 10 MG/ML IJ SOLN
INTRAMUSCULAR | Status: DC | PRN
  Administered 2020-01-27: 10 mg via INTRAVENOUS

## 2020-01-27 MED ORDER — LIDOCAINE 2% (20 MG/ML) 5 ML SYRINGE
INTRAMUSCULAR | Status: DC | PRN
  Administered 2020-01-27: 100 mg via INTRAVENOUS

## 2020-01-27 MED ORDER — MIDAZOLAM HCL 2 MG/2ML IJ SOLN
INTRAMUSCULAR | Status: AC
  Filled 2020-01-27: qty 2

## 2020-01-27 MED ORDER — ROCURONIUM BROMIDE 100 MG/10ML IV SOLN
INTRAVENOUS | Status: DC | PRN
  Administered 2020-01-27: 5 mg via INTRAVENOUS

## 2020-01-27 MED ORDER — FAMOTIDINE 20 MG PO TABS
ORAL_TABLET | ORAL | Status: AC
  Administered 2020-01-27: 08:00:00 20 mg via ORAL
  Filled 2020-01-27: qty 1

## 2020-01-27 MED ORDER — FENTANYL CITRATE (PF) 100 MCG/2ML IJ SOLN
INTRAMUSCULAR | Status: AC
  Administered 2020-01-27: 12:00:00 25 ug via INTRAVENOUS
  Filled 2020-01-27: qty 2

## 2020-01-27 MED ORDER — FENTANYL CITRATE (PF) 100 MCG/2ML IJ SOLN
25.0000 ug | INTRAMUSCULAR | Status: AC | PRN
  Administered 2020-01-27 (×6): 25 ug via INTRAVENOUS

## 2020-01-27 MED ORDER — SUCCINYLCHOLINE CHLORIDE 20 MG/ML IJ SOLN
INTRAMUSCULAR | Status: DC | PRN
  Administered 2020-01-27: 140 mg via INTRAVENOUS

## 2020-01-27 MED ORDER — GLYCOPYRROLATE 0.2 MG/ML IJ SOLN
INTRAMUSCULAR | Status: DC | PRN
  Administered 2020-01-27: .2 mg via INTRAVENOUS

## 2020-01-27 MED ORDER — FENTANYL CITRATE (PF) 100 MCG/2ML IJ SOLN
INTRAMUSCULAR | Status: AC
  Filled 2020-01-27: qty 2

## 2020-01-27 MED ORDER — MIDAZOLAM HCL 2 MG/2ML IJ SOLN
INTRAMUSCULAR | Status: DC | PRN
  Administered 2020-01-27: 2 mg via INTRAVENOUS

## 2020-01-27 MED ORDER — LIDOCAINE HCL (PF) 2 % IJ SOLN
INTRAMUSCULAR | Status: AC
  Filled 2020-01-27: qty 5

## 2020-01-27 MED ORDER — ACETAMINOPHEN 10 MG/ML IV SOLN
INTRAVENOUS | Status: DC | PRN
  Administered 2020-01-27: 1000 mg via INTRAVENOUS

## 2020-01-27 MED ORDER — KETOROLAC TROMETHAMINE 30 MG/ML IJ SOLN
INTRAMUSCULAR | Status: DC | PRN
  Administered 2020-01-27: 30 mg via INTRAVENOUS

## 2020-01-27 MED ORDER — SILVER NITRATE-POT NITRATE 75-25 % EX MISC
CUTANEOUS | Status: AC
  Filled 2020-01-27: qty 10

## 2020-01-27 MED ORDER — CHLORHEXIDINE GLUCONATE 0.12 % MT SOLN: 15.0000 mL | Freq: Once | OROMUCOSAL | Status: AC

## 2020-01-27 MED ORDER — ONDANSETRON HCL 4 MG/2ML IJ SOLN: 4.0000 mg | Freq: Once | INTRAMUSCULAR | Status: DC | PRN

## 2020-01-27 MED ORDER — FENTANYL CITRATE (PF) 100 MCG/2ML IJ SOLN
INTRAMUSCULAR | Status: AC
  Administered 2020-01-27: 11:00:00 25 ug via INTRAVENOUS
  Filled 2020-01-27: qty 2

## 2020-01-27 MED ORDER — ENOXAPARIN SODIUM 40 MG/0.4ML ~~LOC~~ SOLN: 40.0000 mg | Freq: Two times a day (BID) | SUBCUTANEOUS | 0 refills | Status: DC

## 2020-01-27 MED ORDER — POVIDONE-IODINE 10 % EX SWAB: 2.0000 "application " | Freq: Once | CUTANEOUS | Status: DC

## 2020-01-27 MED ORDER — SILVER NITRATE-POT NITRATE 75-25 % EX MISC
CUTANEOUS | Status: DC | PRN
  Administered 2020-01-27: 4

## 2020-01-27 MED ORDER — ONDANSETRON HCL 4 MG/2ML IJ SOLN
INTRAMUSCULAR | Status: DC | PRN
  Administered 2020-01-27: 4 mg via INTRAVENOUS

## 2020-01-27 MED ORDER — PHENYLEPHRINE HCL (PRESSORS) 10 MG/ML IV SOLN
INTRAVENOUS | Status: DC | PRN
  Administered 2020-01-27: 100 ug via INTRAVENOUS

## 2020-01-27 MED ORDER — ORAL CARE MOUTH RINSE: 15.0000 mL | Freq: Once | OROMUCOSAL | Status: AC

## 2020-01-27 MED ORDER — PROPOFOL 10 MG/ML IV BOLUS
INTRAVENOUS | Status: DC | PRN
  Administered 2020-01-27: 200 mg via INTRAVENOUS

## 2020-01-27 MED ORDER — DEXMEDETOMIDINE HCL 200 MCG/2ML IV SOLN
INTRAVENOUS | Status: DC | PRN
  Administered 2020-01-27: 12 ug via INTRAVENOUS
  Administered 2020-01-27: 8 ug via INTRAVENOUS

## 2020-01-27 MED ORDER — ONDANSETRON HCL 4 MG/2ML IJ SOLN
INTRAMUSCULAR | Status: AC
  Filled 2020-01-27: qty 2

## 2020-01-27 MED ORDER — PROPOFOL 10 MG/ML IV BOLUS
INTRAVENOUS | Status: AC
  Filled 2020-01-27: qty 20

## 2020-01-27 MED ORDER — ACETAMINOPHEN 10 MG/ML IV SOLN
INTRAVENOUS | Status: AC
  Filled 2020-01-27: qty 100

## 2020-01-27 MED ORDER — LACTATED RINGERS IV SOLN: INTRAVENOUS | Status: DC

## 2020-01-27 MED ORDER — FAMOTIDINE 20 MG PO TABS: 20.0000 mg | ORAL_TABLET | Freq: Once | ORAL | Status: AC

## 2020-01-27 MED ORDER — FENTANYL CITRATE (PF) 100 MCG/2ML IJ SOLN
INTRAMUSCULAR | Status: DC | PRN
  Administered 2020-01-27: 50 ug via INTRAVENOUS
  Administered 2020-01-27: 100 ug via INTRAVENOUS
  Administered 2020-01-27: 50 ug via INTRAVENOUS

## 2020-01-27 MED ORDER — CHLORHEXIDINE GLUCONATE 0.12 % MT SOLN
OROMUCOSAL | Status: AC
  Administered 2020-01-27: 08:00:00 15 mL via OROMUCOSAL
  Filled 2020-01-27: qty 15

## 2020-01-27 MED ORDER — DEXAMETHASONE SODIUM PHOSPHATE 10 MG/ML IJ SOLN
INTRAMUSCULAR | Status: AC
  Filled 2020-01-27: qty 1

## 2020-01-27 SURGICAL SUPPLY — 20 items
CATH ROBINSON RED A/P 16FR (CATHETERS) ×3 IMPLANT
COVER WAND RF STERILE (DRAPES) ×3 IMPLANT
DEVICE MYOSURE LITE (MISCELLANEOUS) ×3 IMPLANT
GAUZE 4X4 16PLY RFD (DISPOSABLE) ×3 IMPLANT
GLOVE BIO SURGEON STRL SZ7 (GLOVE) ×3 IMPLANT
GLOVE INDICATOR 7.5 STRL GRN (GLOVE) ×3 IMPLANT
GOWN STRL REUS W/ TWL LRG LVL3 (GOWN DISPOSABLE) ×1 IMPLANT
GOWN STRL REUS W/TWL LRG LVL3 (GOWN DISPOSABLE) ×3
INFUSOR MANOMETER BAG 3000ML (MISCELLANEOUS) ×3 IMPLANT
IV LACTATED RINGER IRRG 3000ML (IV SOLUTION) ×3
IV LR IRRIG 3000ML ARTHROMATIC (IV SOLUTION) ×1 IMPLANT
KIT PROCEDURE FLUENT (KITS) ×3 IMPLANT
KIT TURNOVER CYSTO (KITS) ×3 IMPLANT
PACK DNC HYST (MISCELLANEOUS) ×3 IMPLANT
PAD OB MATERNITY 4.3X12.25 (PERSONAL CARE ITEMS) ×3 IMPLANT
PAD PREP 24X41 OB/GYN DISP (PERSONAL CARE ITEMS) ×3 IMPLANT
SEAL ROD LENS SCOPE MYOSURE (ABLATOR) ×3 IMPLANT
TOWEL OR 17X26 4PK STRL BLUE (TOWEL DISPOSABLE) ×3 IMPLANT
TUBING CONNECTING 10 (TUBING) ×2 IMPLANT
TUBING CONNECTING 10' (TUBING) ×1

## 2020-01-27 NOTE — Op Note (Signed)
  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 Hysteroscopy, dilation and curettage 2. Lysis of synechiae of uterus  Primary Surgeon: Prentice Docker, MD   Assistant Surgeon: Humberto Leep, MD - No other capable assistant available, in surgery requiring high level assistant.  EBL: 100 mL   IVF: 400 mL   Urine output: 100 mL  Specimens: endometrial curettings  Drains: none  Complications: None   Disposition: PACU   Condition: Stable   Findings:  1) enlarged uterus with hematometra 2) uterine synechiae, especially in the lower uterine segment/internal cervical os. 3) Other mild synechiae noted on hysteroscopy that should not result in hematometra  Procedure Summary:   The patient was taken to the operating room where general anesthesia was administered and found to be adequate.  She was place in the dorsal, supine lithotomy position and prepped and draped in the usual, sterile fashion.  After a time-out was called, a sterile speculum was placed in her vagina.  The anterior lip of her cervix was grasped with the single-tooth tenaculum.    Using ultrasound guidance from the assistant surgeon, the Hegar dilator could be visualized and directed into the endometrial cavity without fear of uterine perforation.  Once the small Hegar dilator was passed, the cervix was gradually dilated using Hegar dilators to 7 mm.  The MyoSure hysteroscope was passed into the cervix and at the internal os the uterus synechiae were removed.  Using ultrasound guidance (given poor visualization), one other synechia was noted and removed.  The use of  inflow/outflow of fluid with the hysteroscope allowed the clearance of the hematometra from the uterus until the uterus could be properly visualized with the distension medium on hysteroscope.  No other significant synechiae were noted. So, the procedure was terminated.  A gentle curettage was performed so as not to increase formation of new synechiae .    The assistant was necessary in this difficult procedure to provide ultrasound guidance for at least the first half of the procedure.  Without this service, the surgery would have been more difficult and possible ending with a complication of uterine perforation.   The patient tolerated the procedure well.  Sponge, lap, needle, and instrument counts were correct x 2.  VTE prophylaxis: SCDs. Antibiotic prophylaxis: none. She was awakened in the operating room and was taken to the PACU in stable condition. The assistant surgeon was an MD due to lack of availability of another Counselling psychologist.   Prentice Docker, MD 01/27/2020 5:43 PM

## 2020-01-27 NOTE — Discharge Instructions (Signed)
AMBULATORY SURGERY  DISCHARGE INSTRUCTIONS   1) The drugs that you were given will stay in your system until tomorrow so for the next 24 hours you should not:  A) Drive an automobile B) Make any legal decisions C) Drink any alcoholic beverage   2) You may resume regular meals tomorrow.  Today it is better to start with liquids and gradually work up to solid foods.  You may eat anything you prefer, but it is better to start with liquids, then soup and crackers, and gradually work up to solid foods.   3) Please notify your doctor immediately if you have any unusual bleeding, trouble breathing, redness and pain at the surgery site, drainage, fever, or pain not relieved by medication.    4) Additional Instructions:   Dilation and Curettage or Vacuum Curettage, Care After These instructions give you information about caring for yourself after your procedure. Your doctor may also give you more specific instructions. Call your doctor if you have any problems or questions after your procedure. Follow these instructions at home: Activity  Do not drive or use heavy machinery while taking prescription pain medicine.  For 24 hours after your procedure, avoid driving.  Take short walks often, followed by rest periods. Ask your doctor what activities are safe for you. After one or two days, you may be able to return to your normal activities.  Do not lift anything that is heavier than 10 lb (4.5 kg) until your doctor approves.  For at least 2 weeks, or as long as told by your doctor: ? Do not douche. ? Do not use tampons. ? Do not have sex. General instructions   Take over-the-counter and prescription medicines only as told by your doctor. This is very important if you take blood thinning medicine.  Do not take baths, swim, or use a hot tub until your doctor approves. Take showers instead of baths.  Wear compression stockings as told by your doctor.  It is up  to you to get the results of your procedure. Ask your doctor when your results will be ready.  Keep all follow-up visits as told by your doctor. This is important. Contact a doctor if:  You have very bad cramps that get worse or do not get better with medicine.  You have very bad pain in your belly (abdomen).  You cannot drink fluids without throwing up (vomiting).  You get pain in a different part of the area between your belly and thighs (pelvis).  You have bad-smelling discharge from your vagina.  You have a rash. Get help right away if:  You are bleeding a lot from your vagina. A lot of bleeding means soaking more than one sanitary pad in an hour, for 2 hours in a row.  You have clumps of blood (blood clots) coming from your vagina.  You have a fever or chills.  Your belly feels very tender or hard.  You have chest pain.  You have trouble breathing.  You cough up blood.  You feel dizzy.  You feel light-headed.  You pass out (faint).  You have pain in your neck or shoulder area. Summary  Take short walks often, followed by rest periods. Ask your doctor what activities are safe for you. After one or two days, you may be able to return to your normal activities.  Do not lift anything that is heavier than 10 lb (4.5 kg) until your doctor approves.  Do not take baths, swim, or use  a hot tub until your doctor approves. Take showers instead of baths.  Contact your doctor if you have any symptoms of infection, like bad-smelling discharge from your vagina. This information is not intended to replace advice given to you by your health care provider. Make sure you discuss any questions you have with your health care provider. Document Revised: 01/12/2017 Document Reviewed: 10/18/2015 Elsevier Patient Education  [place]. Please contact your physician with any problems or Same Day Surgery at [phone], Monday through Friday 6 am to 4 pm, or [place] at  [facility] number at [phone].

## 2020-01-27 NOTE — H&P (Signed)
Preoperative History and Physical  Diana Sexton is a 34 y.o. [id number] here for surgical management of acute pelvic pain, female, hematometra, post endometrial ablation syndrome.   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   History of Present Illness: Patient is a 34 y.o. [id number] who LMP was No LMP recorded. Patient has had an ablation., presents today for a problem visit.  She complains of acute onset and worsening pelvic pain with onset of vaginal bleeding in the context of prior [place] endometrial ablation in 12/24/2018.   Her pain is localized to the suprapubic area, described as intermittent and cramping, began several days ago and its severity is described as severe. The pain radiates to the back, groin. She has these associated symptoms which include abdominal pain and nausea. Patient has these modifiers which include pain medication that make it better and unable to associate with any factor that make it worse.    She had a pelvic ultrasound on 01/20/2020 that showed (per report):  FINDINGS: Uterus  Measurements: 12.3 x 7.3 x 8.9 cm = volume: 417 mL. Uterus is anteverted. No discrete fibroid or other mass.  Endometrium  Endometrial cavity is markedly distended measuring up to approximately 4.7 cm in diameter. The cavity is filled with somewhat homogeneous echogenic material, corresponding with abnormality on prior CT. This material appears at least partially mobile in nature on cine imaging. Suggestion of internal fluid-fluid level. No appreciable vascularity by transabdominal technique. The underlying endometrial stripe measures approximately 11 mm in thickness, although exact measurements somewhat difficult given the adjacent echogenic material.  Right ovary  Measurements: 2.4 x 2.4 x 2.7 cm = volume: 11.0 mL. 2.7 x 2.5 x 2.0 cm complex cyst with scattered low-level internal echoes and  some reticular echogenicity, favored to reflect a hemorrhagic cyst. No internal vascularity or solid component.  Left ovary  Measurements: 5.3 x 3.7 x 3.5 cm = volume: 35.6 mL. 4.4 x 4.1 x 3.4 cm complex cyst seen within the left ovary. Mural based somewhat angulated and elongated echogenic material measuring 2.1 x 1.8 x 0.8 cm favored to reflect retractile clot. No associated vascularity or definite solid component.  Pulsed Doppler evaluation of both ovaries demonstrates normal low-resistance arterial and venous waveforms.  Other findings  Trace free fluid noted within the pelvis.  IMPRESSION: 1. Endometrial cavity is markedly distended measuring up to 4.7 cm in diameter, filled with fairly homogeneous and partially mobile echogenic material, with suggestion of internal fluid-fluid level. The underlying endometrial stripe measures approximately 11 mm in thickness, although is difficult to evaluate given the adjacent echogenic material. Findings favored to reflect large volume blood products within the endometrial cavity, although a possible endometrial neoplasm is not entirely excluded. Gynecologic referral for further workup and endometrial sampling recommended. 2. Bilateral complex ovarian cysts measuring up to 2.7 cm on the right and 4.4 cm on the left, indeterminate, but favored to reflect hemorrhagic cyst. A short interval follow-up ultrasound in 6-12 weeks is recommended for further evaluation. 3. No evidence for ovarian torsion.  Previous evaluation: emergency room visit on 01/20/2020. Prior Diagnosis: hematometra . Previous Treatment: dilaudid.  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    Past Medical History:  Diagnosis Date  . Anemia   . Anxiety   . Cellulitis 2012   caused stillbirth  . DVT (deep venous thrombosis) ([place]) 06/2011   under right clavicle  .  Dysmenorrhea   . Family history of breast  cancer 11/2013   BRCA/MyRisk neg  . H/O blood clots   . Headache    related to hormonal meds  . History of kidney stones   . History of prothrombin mutation   . Increased risk of breast cancer 11/2013   IBIS=27%  . Obesity    Past Surgical History:  Procedure Laterality Date  . CHOLECYSTECTOMY  2007  . CYSTOSCOPY W/ RETROGRADES Left 02/02/2017   Procedure: CYSTOSCOPY WITH RETROGRADE PYELOGRAM;  Surgeon: Abbie Sons, MD;  Location: [facility];  Service: Urology;  Laterality: Left;  . CYSTOSCOPY W/ RETROGRADES Right 06/26/2019   Procedure: CYSTOSCOPY WITH RETROGRADE PYELOGRAM;  Surgeon: Abbie Sons, MD;  Location: [facility];  Service: Urology;  Laterality: Right;  . CYSTOSCOPY/URETEROSCOPY/HOLMIUM LASER/STENT PLACEMENT Left 02/02/2017   Procedure: CYSTOSCOPY/URETEROSCOPY/HOLMIUM LASER/STENT PLACEMENT;  Surgeon: Abbie Sons, MD;  Location: [facility];  Service: Urology;  Laterality: Left;  . CYSTOSCOPY/URETEROSCOPY/HOLMIUM LASER/STENT PLACEMENT Right 06/26/2019   Procedure: CYSTOSCOPY/URETEROSCOPY/HOLMIUM LASER/STENT PLACEMENT;  Surgeon: Abbie Sons, MD;  Location: [facility];  Service: Urology;  Laterality: Right;  . DILITATION & CURRETTAGE/HYSTROSCOPY WITH ESSURE    . DILITATION & CURRETTAGE/HYSTROSCOPY WITH NOVASURE ABLATION N/A 12/24/2018   Procedure: DILATATION & CURETTAGE/HYSTEROSCOPY WITH ENDOMETRIAL ABLATION (MINERVA SYSTEM);  Surgeon: Will Bonnet, MD;  Location: [facility];  Service: Gynecology;  Laterality: N/A;  . INTRAUTERINE DEVICE (IUD) INSERTION    . LITHOTRIPSY    . TONSILLECTOMY    . TONSILLECTOMY AND ADENOIDECTOMY    . TUBAL LIGATION  2014  . XI ROBOTIC ASSISTED VENTRAL HERNIA N/A 01/06/2020   Procedure: XI ROBOTIC ASSISTED VENTRAL HERNIA;  Surgeon: Olean Ree, MD;  Location: [facility];  Service: General;  Laterality: N/A;   OB History  Gravida Para Term Preterm AB Living  5 2 2   3 2   SAB IAB Ectopic Multiple Live Births  3       2    # Outcome  Date GA Lbr Len/2nd Weight Sex Delivery Anes PTL Lv  5 Term 03/05/12    F Vag-Spont   LIV  4 Term 11/29/09    M Vag-Spont   LIV  3 SAB           2 SAB           1 SAB           Patient denies any other pertinent gynecologic issues.   No current facility-administered medications on file prior to encounter.   Current Outpatient Medications on File Prior to Encounter  Medication Sig Dispense Refill  . acetaminophen (TYLENOL) 500 MG tablet Take 1,000 mg by mouth every 6 (six) hours as needed for moderate pain.    Marland Kitchen HYDROmorphone (DILAUDID) 2 MG tablet Take 0.5 tablets (1 mg total) by mouth every 4 (four) hours as needed for up to 5 days for severe pain. 30 tablet 0  . diphenhydrAMINE (BENADRYL) 2 % cream Apply topically 3 (three) times daily as needed for itching.    . enoxaparin (LOVENOX) 40 MG/0.4ML injection Inject 0.4 mLs (40 mg total) into the skin every 12 (twelve) hours for 3 days. (Patient not taking: Reported on 01/23/2020) 2.4 mL 0  . ibuprofen (ADVIL) 600 MG tablet Take 1 tablet (600 mg total) by mouth every 8 (eight) hours as needed for mild pain or moderate pain. (Patient not taking: Reported on 01/23/2020) 60 tablet 1  . naproxen (NAPROSYN) 500 MG tablet Take 1 tablet (500 mg  total) by mouth 2 (two) times daily with a meal. 20 tablet 0   Allergies  Allergen Reactions  . Oxycodone Rash  . Penicillins Rash    Has had a PCN reaction causing, facial/tongue/throat swelling, SOB or lightheadedness with hypotension: NO Has had a PCN reaction causing severe rash involving mucus membranes or skin necrosis: NO Has had a PCN reaction that required hospitalization: No Has had a PCN reaction occurring within the last 10 years: Yes If all of the above answers are "NO", then may proceed with Cephalosporin use. Updated 01/06/20; Reaction was low severity rash. Ancef given. No reaction     Social History:   reports that she has never smoked. She has never used smokeless tobacco. She reports  current alcohol use. She reports that she does not use drugs.  Family History  Problem Relation Age of Onset  . Cancer Mother        breast  . Diabetes Father   . Hypertension Father   . Heart disease Maternal Grandfather   . Dementia Paternal Grandmother   . Bladder Cancer Neg Hx   . Kidney cancer Neg Hx     Review of Systems: Noncontributory  PHYSICAL EXAM: Blood pressure 119/60, pulse 70, temperature (!) 96.7 F (35.9 C), temperature source Tympanic, resp. rate 16, height 5' 5"  (1.651 m), weight 136 kg, SpO2 100 %. CONSTITUTIONAL: Well-developed, well-nourished female in no acute distress.  HENT:  Normocephalic, atraumatic, External right and left ear normal. Oropharynx is clear and moist EYES: Conjunctivae and EOM are normal. Pupils are equal, round, and reactive to light. No scleral icterus.  NECK: Normal range of motion, supple, no masses SKIN: Skin is warm and dry. No rash noted. Not diaphoretic. No erythema. No pallor. [place]: Alert and oriented to person, place, and time. Normal reflexes, muscle tone coordination. No cranial nerve deficit noted. PSYCHIATRIC: Normal mood and affect. Normal behavior. Normal judgment and thought content. CARDIOVASCULAR: Normal heart rate noted, regular rhythm RESPIRATORY: Effort and breath sounds normal, no problems with respiration noted ABDOMEN: Soft, nontender, nondistended. PELVIC: Deferred MUSCULOSKELETAL: Normal range of motion. No edema and no tenderness. 2+ distal pulses.  Labs: Results for orders placed or performed during the hospital encounter of 01/27/20 (from the past 336 hour(s))  Pregnancy, urine POC   Collection Time: 01/27/20  7:38 AM  Result Value Ref Range   Preg Test, Ur NEGATIVE NEGATIVE  Results for orders placed or performed during the hospital encounter of 01/26/20 (from the past 336 hour(s))  Lipase, blood   Collection Time: 01/26/20  6:30 AM  Result Value Ref Range   Lipase 20 11 - 51 U/L  Comprehensive  metabolic panel   Collection Time: 01/26/20  6:30 AM  Result Value Ref Range   Sodium 139 135 - 145 mmol/L   Potassium 4.1 3.5 - 5.1 mmol/L   Chloride 104 98 - 111 mmol/L   CO2 25 22 - 32 mmol/L   Glucose, Bld 109 (H) 70 - 99 mg/dL   BUN 16 6 - 20 mg/dL   Creatinine, Ser 0.62 0.44 - 1.00 mg/dL   Calcium 9.3 8.9 - 10.3 mg/dL   Total Protein 7.7 6.5 - 8.1 g/dL   Albumin 3.7 3.5 - 5.0 g/dL   AST 18 15 - 41 U/L   ALT 15 0 - 44 U/L   Alkaline Phosphatase 66 38 - 126 U/L   Total Bilirubin 0.6 0.3 - 1.2 mg/dL   GFR, Estimated >60 >60 mL/min   Anion gap  10 5 - 15  CBC   Collection Time: 01/26/20  6:30 AM  Result Value Ref Range   WBC 8.6 4.0 - 10.5 K/uL   RBC 4.99 3.87 - 5.11 MIL/uL   Hemoglobin 12.6 12.0 - 15.0 g/dL   HCT 38.3 36.0 - 46.0 %   MCV 76.8 (L) 80.0 - 100.0 fL   MCH 25.3 (L) 26.0 - 34.0 pg   MCHC 32.9 30.0 - 36.0 g/dL   RDW 16.0 (H) 11.5 - 15.5 %   Platelets 400 150 - 400 K/uL   nRBC 0.0 0.0 - 0.2 %  Resp Panel by RT-PCR (Flu A&B, Covid) Nasopharyngeal Swab   Collection Time: 01/26/20  7:59 AM   Specimen: Nasopharyngeal Swab; Nasopharyngeal(NP) swabs in vial transport medium  Result Value Ref Range   SARS Coronavirus 2 by RT PCR NEGATIVE NEGATIVE   Influenza A by PCR NEGATIVE NEGATIVE   Influenza B by PCR NEGATIVE NEGATIVE  Results for orders placed or performed during the hospital encounter of 01/20/20 (from the past 336 hour(s))  Lipase, blood   Collection Time: 01/20/20 10:46 AM  Result Value Ref Range   Lipase 24 11 - 51 U/L  Comprehensive metabolic panel   Collection Time: 01/20/20 10:46 AM  Result Value Ref Range   Sodium 137 135 - 145 mmol/L   Potassium 3.9 3.5 - 5.1 mmol/L   Chloride 104 98 - 111 mmol/L   CO2 24 22 - 32 mmol/L   Glucose, Bld 98 70 - 99 mg/dL   BUN 8 6 - 20 mg/dL   Creatinine, Ser 0.59 0.44 - 1.00 mg/dL   Calcium 9.3 8.9 - 10.3 mg/dL   Total Protein 8.1 6.5 - 8.1 g/dL   Albumin 3.7 3.5 - 5.0 g/dL   AST 17 15 - 41 U/L   ALT 18 0 -  44 U/L   Alkaline Phosphatase 64 38 - 126 U/L   Total Bilirubin 0.4 0.3 - 1.2 mg/dL   GFR, Estimated >60 >60 mL/min   Anion gap 9 5 - 15  CBC   Collection Time: 01/20/20 10:46 AM  Result Value Ref Range   WBC 8.3 4.0 - 10.5 K/uL   RBC 5.29 (H) 3.87 - 5.11 MIL/uL   Hemoglobin 13.1 12.0 - 15.0 g/dL   HCT 40.4 36.0 - 46.0 %   MCV 76.4 (L) 80.0 - 100.0 fL   MCH 24.8 (L) 26.0 - 34.0 pg   MCHC 32.4 30.0 - 36.0 g/dL   RDW 15.7 (H) 11.5 - 15.5 %   Platelets 409 (H) 150 - 400 K/uL   nRBC 0.0 0.0 - 0.2 %  Urinalysis, Complete w Microscopic   Collection Time: 01/20/20 10:46 AM  Result Value Ref Range   Color, Urine STRAW (A) YELLOW   APPearance CLEAR (A) CLEAR   Specific Gravity, Urine 1.006 1.005 - 1.030   pH 5.0 5.0 - 8.0   Glucose, UA NEGATIVE NEGATIVE mg/dL   Hgb urine dipstick SMALL (A) NEGATIVE   Bilirubin Urine NEGATIVE NEGATIVE   Ketones, ur NEGATIVE NEGATIVE mg/dL   Protein, ur NEGATIVE NEGATIVE mg/dL   Nitrite NEGATIVE NEGATIVE   Leukocytes,Ua NEGATIVE NEGATIVE   RBC / HPF 0-5 0 - 5 RBC/hpf   WBC, UA 0-5 0 - 5 WBC/hpf   Bacteria, UA RARE (A) NONE SEEN   Squamous Epithelial / LPF 0-5 0 - 5   Mucus PRESENT   POC urine preg, ED (not at [facility])   Collection Time: 01/20/20 11:08 AM  Result Value Ref Range   Preg Test, Ur NEGATIVE NEGATIVE    Imaging Studies: CT ABDOMEN PELVIS W CONTRAST  Result Date: 01/20/2020 CLINICAL DATA:  Bowel obstruction suspected. Acute abdominal pain. Recent incisional ventral hernia repair on 01/06/2020. History of cholecystectomy. EXAM: CT ABDOMEN AND PELVIS WITH CONTRAST TECHNIQUE: Multidetector CT imaging of the abdomen and pelvis was performed using the standard protocol following bolus administration of intravenous contrast. CONTRAST:  187m OMNIPAQUE IOHEXOL 300 MG/ML  SOLN COMPARISON:  CT renal 12/16/2019 FINDINGS: Lower chest: No acute abnormality. Hepatobiliary: No focal liver abnormality is seen. Status post cholecystectomy. No biliary  dilatation. Pancreas: No focal lesion. Normal pancreatic contour. No surrounding inflammatory changes. No main pancreatic ductal dilatation. Spleen: Normal in size without focal abnormality. Adrenals/Urinary Tract: No adrenal nodule bilaterally. Bilateral kidneys enhance symmetrically. No hydronephrosis. Bilateral calcified stones measuring up to 7 mm on the left and 4 mm on the right. No hydroureter. The urinary bladder is unremarkable. Stomach/Bowel: Stomach is within normal limits. No evidence of bowel wall thickening or dilatation. Appendix appears normal. Vascular/Lymphatic: No abdominal aorta or iliac aneurysm. No abdominal, pelvic, or inguinal lymphadenopathy. Reproductive: Abnormally thickened endometrium measuring up to 412m(normal is up to 1874m The uterus is otherwise unremarkable. There is a 4.7 x 2.9 cm fluid density lesion within the left ovary that likely represents a simple ovarian cyst. Bilateral ovaries and adnexal regions are otherwise unremarkable. Other: Anterior abdomen mesenteric stranding along the hernia repair surgical bed likely representing postsurgical changes. No intraperitoneal free fluid. No intraperitoneal free gas. No organized fluid collection. Musculoskeletal: Difficult to measure 3 x 0.5 x 1 cm fluid density within the subcutaneus soft tissues along the ventral hernia surgical bed likely representing a seroma. No associated peripheral enhancement. No subcutaneus soft tissue abscess or phlegmon formation. No recurrent ventral hernia identified. No suspicious lytic or blastic osseous lesions. No acute displaced fracture. Multilevel degenerative changes of the spine. IMPRESSION: 1. Abnormally thickened endometrium (47 mm). Recommend pelvic ultrasound for further evaluation. 2. A 4.7 cm left ovarian cystic lesion. No follow-up imaging recommended. Note: This recommendation does not apply to premenarchal patients and to those with increased risk (genetic, family history, elevated  tumor markers or other high-risk factors) of ovarian cancer. Reference: JACR 2020 Feb; 17(2):248-254 3. Nonobstructive bilateral nephrolithiasis (up to 7 mm on the left, up to 4 mm on the right). 4. Healing anterior ventral incisional hernia repair with a thin 3 x 0.5 x 1 cm fluid density within the subcutaneus soft tissues likely representing a seroma (too small for drainage). No evidence of abscess formation. Electronically Signed   By: MorIven FinnD.   On: 01/20/2020 15:11   [place] COMPLETE W TRANSVAGINAL AND TORSION R/O  Result Date: 01/20/2020 CLINICAL DATA:  Follow-up examination for abnormal CT scan, left ovarian cyst, thickened endometrium. History of prior endometrial ablation. EXAM: TRANSABDOMINAL AND TRANSVAGINAL ULTRASOUND OF PELVIS DOPPLER ULTRASOUND OF OVARIES TECHNIQUE: Both transabdominal and transvaginal ultrasound examinations of the pelvis were performed. Transabdominal technique was performed for global imaging of the pelvis including uterus, ovaries, adnexal regions, and pelvic cul-de-sac. It was necessary to proceed with endovaginal exam following the transabdominal exam to visualize the uterus, endometrium, and ovaries. Color and duplex Doppler ultrasound was utilized to evaluate blood flow to the ovaries. COMPARISON:  Prior CT from earlier the same day. FINDINGS: Uterus Measurements: 12.3 x 7.3 x 8.9 cm = volume: 417 mL. Uterus is anteverted. No discrete fibroid or other mass. Endometrium Endometrial cavity is markedly  distended measuring up to approximately 4.7 cm in diameter. The cavity is filled with somewhat homogeneous echogenic material, corresponding with abnormality on prior CT. This material appears at least partially mobile in nature on cine imaging. Suggestion of internal fluid-fluid level. No appreciable vascularity by transabdominal technique. The underlying endometrial stripe measures approximately 11 mm in thickness, although exact measurements somewhat difficult  given the adjacent echogenic material. Right ovary Measurements: 2.4 x 2.4 x 2.7 cm = volume: 11.0 mL. 2.7 x 2.5 x 2.0 cm complex cyst with scattered low-level internal echoes and some reticular echogenicity, favored to reflect a hemorrhagic cyst. No internal vascularity or solid component. Left ovary Measurements: 5.3 x 3.7 x 3.5 cm = volume: 35.6 mL. 4.4 x 4.1 x 3.4 cm complex cyst seen within the left ovary. Mural based somewhat angulated and elongated echogenic material measuring 2.1 x 1.8 x 0.8 cm favored to reflect retractile clot. No associated vascularity or definite solid component. Pulsed Doppler evaluation of both ovaries demonstrates normal low-resistance arterial and venous waveforms. Other findings Trace free fluid noted within the pelvis. IMPRESSION: 1. Endometrial cavity is markedly distended measuring up to 4.7 cm in diameter, filled with fairly homogeneous and partially mobile echogenic material, with suggestion of internal fluid-fluid level. The underlying endometrial stripe measures approximately 11 mm in thickness, although is difficult to evaluate given the adjacent echogenic material. Findings favored to reflect large volume blood products within the endometrial cavity, although a possible endometrial neoplasm is not entirely excluded. Gynecologic referral for further workup and endometrial sampling recommended. 2. Bilateral complex ovarian cysts measuring up to 2.7 cm on the right and 4.4 cm on the left, indeterminate, but favored to reflect hemorrhagic cyst. A short interval follow-up ultrasound in 6-12 weeks is recommended for further evaluation. 3. No evidence for ovarian torsion. Electronically Signed   By: Jeannine Boga M.D.   On: 01/20/2020 18:21    Assessment: Patient Active Problem List   Diagnosis Date Noted  . Acute pelvic pain, female 01/27/2020  . Hematometra 01/27/2020  . 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   The risks of surgery were discussed in detail with the patient including but not limited to: bleeding which may require transfusion or reoperation; infection which may require antibiotics; injury to surrounding organs which may involve bowel, bladder, ureters ; need for additional procedures including laparoscopy or laparotomy; thromboembolic phenomenon, surgical site problems and other postoperative/anesthesia complications. Likelihood of success in alleviating the patient's condition was discussed. Routine postoperative instructions will be reviewed with the patient and her family in detail after surgery.  The patient concurred with the proposed plan, giving informed written consent for the surgery.  Patient has been NPO since last night she will remain NPO for procedure.  Anesthesia and OR aware.  Preoperative prophylactic antibiotics, as indicated, and SCDs ordered on call to the OR.  To OR when ready.  Prentice Docker, MD 01/27/2020 9:06 AM

## 2020-01-27 NOTE — Transfer of Care (Signed)
Immediate Anesthesia Transfer of Care Note  Patient: Diana Sexton  Procedure(s) Performed: DILATATION AND CURETTAGE /HYSTEROSCOPY (N/A Vagina )  Patient Location: PACU  Anesthesia Type:General  Level of Consciousness: awake, alert  and oriented  Airway & Oxygen Therapy: Patient Spontanous Breathing and Patient connected to face mask oxygen  Post-op Assessment: Report given to RN and Post -op Vital signs reviewed and stable  Post vital signs: Reviewed and stable  Last Vitals:  Vitals Value Taken Time  BP 129/81 01/27/20 1111  Temp 36.3 C 01/27/20 1111  Pulse 69 01/27/20 1114  Resp 18 01/27/20 1114  SpO2 100 % 01/27/20 1114  Vitals shown include unvalidated device data.  Last Pain:  Vitals:   01/27/20 0811  TempSrc: Tympanic  PainSc: 6          Complications: No complications documented.

## 2020-01-27 NOTE — Anesthesia Preprocedure Evaluation (Signed)
Anesthesia Evaluation  Patient identified by MRN, date of birth, ID band Patient awake    Reviewed: Allergy & Precautions, H&P , NPO status , Patient's Chart, lab work & pertinent test results, reviewed documented beta blocker date and time   Airway Mallampati: II  TM Distance: >3 FB Neck ROM: full    Dental  (+) Teeth Intact   Pulmonary neg pulmonary ROS,    Pulmonary exam normal        Cardiovascular Exercise Tolerance: Good negative cardio ROS Normal cardiovascular exam Rhythm:regular Rate:Normal     Neuro/Psych  Headaches, PSYCHIATRIC DISORDERS Anxiety Depression    GI/Hepatic negative GI ROS, Neg liver ROS,   Endo/Other  Morbid obesity  Renal/GU Renal disease  negative genitourinary   Musculoskeletal   Abdominal   Peds  Hematology negative hematology ROS (+) Blood dyscrasia, anemia ,   Anesthesia Other Findings Past Medical History: No date: Anemia No date: Anxiety 2012: Cellulitis     Comment:  caused stillbirth 06/2011: DVT (deep venous thrombosis) ([place])     Comment:  under right clavicle No date: Dysmenorrhea 11/2013: Family history of breast cancer     Comment:  BRCA/MyRisk neg No date: H/O blood clots No date: Headache     Comment:  related to hormonal meds No date: History of kidney stones No date: History of prothrombin mutation 11/2013: Increased risk of breast cancer     Comment:  IBIS=27% No date: Obesity Past Surgical History: 2007: CHOLECYSTECTOMY 02/02/2017: CYSTOSCOPY W/ RETROGRADES; Left     Comment:  Procedure: CYSTOSCOPY WITH RETROGRADE PYELOGRAM;                Surgeon: Abbie Sons, MD;  Location: [facility];                Service: Urology;  Laterality: Left; 06/26/2019: CYSTOSCOPY W/ RETROGRADES; Right     Comment:  Procedure: CYSTOSCOPY WITH RETROGRADE PYELOGRAM;                Surgeon: Abbie Sons, MD;  Location: [facility];                Service: Urology;   Laterality: Right; 02/02/2017: CYSTOSCOPY/URETEROSCOPY/HOLMIUM LASER/STENT PLACEMENT;  Left     Comment:  Procedure: CYSTOSCOPY/URETEROSCOPY/HOLMIUM LASER/STENT               PLACEMENT;  Surgeon: Abbie Sons, MD;  Location:               [facility];  Service: Urology;  Laterality: Left; 06/26/2019: CYSTOSCOPY/URETEROSCOPY/HOLMIUM LASER/STENT PLACEMENT;  Right     Comment:  Procedure: CYSTOSCOPY/URETEROSCOPY/HOLMIUM LASER/STENT               PLACEMENT;  Surgeon: Abbie Sons, MD;  Location:               [facility];  Service: Urology;  Laterality: Right; No date: DILITATION & CURRETTAGE/HYSTROSCOPY WITH ESSURE 12/24/2018: DILITATION & CURRETTAGE/HYSTROSCOPY WITH NOVASURE  ABLATION; N/A     Comment:  Procedure: DILATATION & CURETTAGE/HYSTEROSCOPY WITH               ENDOMETRIAL ABLATION ([place]);  Surgeon: Will Bonnet, MD;  Location: [facility];  Service: Gynecology;              Laterality: N/A; No date: INTRAUTERINE DEVICE (IUD) INSERTION No date: LITHOTRIPSY No date: TONSILLECTOMY No date: TONSILLECTOMY AND ADENOIDECTOMY 2014: TUBAL LIGATION  01/06/2020: XI ROBOTIC ASSISTED VENTRAL HERNIA; N/A     Comment:  Procedure: XI ROBOTIC ASSISTED VENTRAL HERNIA;  Surgeon:              Olean Ree, MD;  Location: [facility];  Service:               General;  Laterality: N/A; BMI    Body Mass Index: 49.89 kg/m     Reproductive/Obstetrics negative OB ROS                             Anesthesia Physical Anesthesia Plan  ASA: II  Anesthesia Plan: General ETT   Post-op Pain Management:    Induction:   PONV Risk Score and Plan: 4 or greater  Airway Management Planned:   Additional Equipment:   Intra-op Plan:   Post-operative Plan:   Informed Consent: I have reviewed the patients History and Physical, chart, labs and discussed the procedure including the risks, benefits and alternatives for the proposed anesthesia with the  patient or authorized representative who has indicated his/her understanding and acceptance.     Dental Advisory Given  Plan Discussed with: CRNA  Anesthesia Plan Comments:         Anesthesia Quick Evaluation

## 2020-01-27 NOTE — Anesthesia Procedure Notes (Signed)
Procedure Name: Intubation Date/Time: 01/27/2020 10:02 AM Performed by: Marsh Dolly, CRNA Pre-anesthesia Checklist: Patient identified, Patient being monitored, Timeout performed, Emergency Drugs available and Suction available Patient Re-evaluated:Patient Re-evaluated prior to induction Oxygen Delivery Method: Circle system utilized Preoxygenation: Pre-oxygenation with 100% oxygen Induction Type: IV induction Ventilation: Mask ventilation without difficulty Laryngoscope Size: 3, McGraph and 4 Grade View: Grade I Tube type: Oral Tube size: 7.0 mm Number of attempts: 1 Airway Equipment and Method: Stylet Placement Confirmation: ETT inserted through vocal cords under direct vision,  positive ETCO2 and breath sounds checked- equal and bilateral Secured at: 21 cm Tube secured with: Tape Dental Injury: Teeth and Oropharynx as per pre-operative assessment

## 2020-01-28 LAB — SURGICAL PATHOLOGY

## 2020-01-28 NOTE — Anesthesia Postprocedure Evaluation (Signed)
Anesthesia Post Note  Patient: Diana Sexton  Procedure(s) Performed: DILATATION AND CURETTAGE /HYSTEROSCOPY (N/A Vagina )  Patient location during evaluation: PACU Anesthesia Type: General Level of consciousness: awake and alert Pain management: pain level controlled Vital Signs Assessment: post-procedure vital signs reviewed and stable Respiratory status: spontaneous breathing, nonlabored ventilation and respiratory function stable Cardiovascular status: blood pressure returned to baseline and stable Postop Assessment: no apparent nausea or vomiting Anesthetic complications: no   No complications documented.   Last Vitals:  Vitals:   01/27/20 1251 01/27/20 1342  BP: (!) 124/55 124/70  Pulse: 77 80  Resp: 16 16  Temp: 36.4 C   SpO2: 100% 96%    Last Pain:  Vitals:   01/28/20 0959  TempSrc:   PainSc: 0-No pain                 Tera Mater

## 2020-02-04 ENCOUNTER — Emergency Department
Admission: EM | Admit: 2020-02-04 | Discharge: 2020-02-04 | Disposition: A | Discharge status: Home / Self Care | Payer: BC Managed Care – PPO | Attending: Emergency Medicine | Admitting: Emergency Medicine

## 2020-02-04 ENCOUNTER — Emergency Department: Payer: BC Managed Care – PPO

## 2020-02-04 ENCOUNTER — Other Ambulatory Visit: Payer: Self-pay

## 2020-02-04 ENCOUNTER — Encounter: Payer: Self-pay | Admitting: Emergency Medicine

## 2020-02-04 DIAGNOSIS — I82441 Acute embolism and thrombosis of right tibial vein: Secondary | ICD-10-CM | POA: Diagnosis not present

## 2020-02-04 DIAGNOSIS — M79661 Pain in right lower leg: Secondary | ICD-10-CM | POA: Diagnosis not present

## 2020-02-04 DIAGNOSIS — M79604 Pain in right leg: Secondary | ICD-10-CM | POA: Diagnosis not present

## 2020-02-04 DIAGNOSIS — Z7901 Long term (current) use of anticoagulants: Secondary | ICD-10-CM | POA: Insufficient documentation

## 2020-02-04 DIAGNOSIS — I82811 Embolism and thrombosis of superficial veins of right lower extremities: Secondary | ICD-10-CM | POA: Diagnosis not present

## 2020-02-04 DIAGNOSIS — I82401 Acute embolism and thrombosis of unspecified deep veins of right lower extremity: Secondary | ICD-10-CM

## 2020-02-04 HISTORY — DX: Acute embolism and thrombosis of unspecified deep veins of right lower extremity: I82.401

## 2020-02-04 MED ORDER — APIXABAN (ELIQUIS) VTE STARTER PACK (10MG AND 5MG): ORAL_TABLET | ORAL | 0 refills | Status: DC

## 2020-02-04 MED ORDER — ACETAMINOPHEN 325 MG PO TABS
650.0000 mg | ORAL_TABLET | Freq: Once | ORAL | Status: AC
  Administered 2020-02-04: 13:00:00 650 mg via ORAL
  Filled 2020-02-04: qty 2

## 2020-02-04 MED ORDER — APIXABAN 5 MG PO TABS
10.0000 mg | ORAL_TABLET | Freq: Once | ORAL | Status: AC
  Administered 2020-02-04: 12:00:00 10 mg via ORAL
  Filled 2020-02-04: qty 2

## 2020-02-04 NOTE — ED Provider Notes (Signed)
[facility] Emergency Department Provider Note  ____________________________________________   None    (approximate)  I have reviewed the triage vital signs and the nursing notes.   HISTORY  Chief Complaint Leg Pain   HPI Diana Sexton is a 34 y.o. female presents to the ED with complaint of right leg pain. Patient states that she has a history of a clotting disorder and has had DVTs in her right upper extremity in the past.  She states that her leg feels tight and is worse today than it was yesterday.  Patient also was recently in the hospital for an ablation and a D&C.  Patient is a non-smoker.  She rates her pain as 7 out of 10.       Past Medical History:  Diagnosis Date  . Anemia   . Anxiety   . Cellulitis 2012   caused stillbirth  . DVT (deep venous thrombosis) ([place]) 06/2011   under right clavicle  . Dysmenorrhea   . Family history of breast cancer 11/2013   BRCA/MyRisk neg  . H/O blood clots   . Headache    related to hormonal meds  . History of kidney stones   . History of prothrombin mutation   . Increased risk of breast cancer 11/2013   IBIS=27%  . Obesity     Patient Active Problem List   Diagnosis Date Noted  . Acute pelvic pain, female 01/27/2020  . Hematometra 01/27/2020  . 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 Ventral hernia without obstruction or gangrene   . Sprain of left knee 04/14/2019  . DVT (deep venous thrombosis) ([place]) 02/09/2018  . Menorrhagia with irregular cycle 10/13/2016  . Other irritable bowel syndrome 05/02/2016  . Urinary tract infection 01/21/2015  . Anxiety and depression 01/21/2015  . Nephrolithiasis 01/15/2015  . Abnormal radiologic findings on diagnostic imaging of renal pelvis, ureter, or bladder 01/15/2015  . Low HDL (under 40) 01/01/2015  . Elevated LDL cholesterol level 01/01/2015  . Insomnia 12/16/2014  . Morbid obesity ([place]) 12/11/2014  . Hematuria 12/11/2014  .  Preventative health care 12/11/2014  . Low back pain 10/13/2014  . Prothrombin gene mutation ([place]) 10/13/2014  . Hydronephrosis 06/07/2012  . Incomplete emptying of bladder 06/07/2012  . Renal colic 94/17/4081  . Ureteric stone 06/07/2012    Past Surgical History:  Procedure Laterality Date  . CHOLECYSTECTOMY  2007  . CYSTOSCOPY W/ RETROGRADES Left 02/02/2017   Procedure: CYSTOSCOPY WITH RETROGRADE PYELOGRAM;  Surgeon: Abbie Sons, MD;  Location: [facility];  Service: Urology;  Laterality: Left;  . CYSTOSCOPY W/ RETROGRADES Right 06/26/2019   Procedure: CYSTOSCOPY WITH RETROGRADE PYELOGRAM;  Surgeon: Abbie Sons, MD;  Location: [facility];  Service: Urology;  Laterality: Right;  . CYSTOSCOPY/URETEROSCOPY/HOLMIUM LASER/STENT PLACEMENT Left 02/02/2017   Procedure: CYSTOSCOPY/URETEROSCOPY/HOLMIUM LASER/STENT PLACEMENT;  Surgeon: Abbie Sons, MD;  Location: [facility];  Service: Urology;  Laterality: Left;  . CYSTOSCOPY/URETEROSCOPY/HOLMIUM LASER/STENT PLACEMENT Right 06/26/2019   Procedure: CYSTOSCOPY/URETEROSCOPY/HOLMIUM LASER/STENT PLACEMENT;  Surgeon: Abbie Sons, MD;  Location: [facility];  Service: Urology;  Laterality: Right;  . DILITATION & CURRETTAGE/HYSTROSCOPY WITH ESSURE    . DILITATION & CURRETTAGE/HYSTROSCOPY WITH NOVASURE ABLATION N/A 12/24/2018   Procedure: DILATATION & CURETTAGE/HYSTEROSCOPY WITH ENDOMETRIAL ABLATION ([place]);  Surgeon: Will Bonnet, MD;  Location: [facility];  Service: Gynecology;  Laterality: N/A;  . HYSTEROSCOPY WITH D & C N/A 01/27/2020   Procedure: DILATATION AND CURETTAGE /HYSTEROSCOPY;  Surgeon: Will Bonnet, MD;  Location:  [facility];  Service: Gynecology;  Laterality: N/A;  . INTRAUTERINE DEVICE (IUD) INSERTION    . LITHOTRIPSY    . TONSILLECTOMY    . TONSILLECTOMY AND ADENOIDECTOMY    . TUBAL LIGATION  2014  . XI ROBOTIC ASSISTED VENTRAL HERNIA N/A 01/06/2020   Procedure: XI ROBOTIC ASSISTED VENTRAL HERNIA;  Surgeon:  Olean Ree, MD;  Location: [facility];  Service: General;  Laterality: N/A;    Prior to Admission medications   Medication Sig Start Date End Date Taking? Authorizing Provider  APIXABAN (ELIQUIS) VTE STARTER PACK ($RemoveBefor'10MG'efxKFhxxOyhG$  AND $Re'5MG'OrI$ ) Take as directed on package: start with two-$RemoveBefore'5mg'sHEWoMlvtiswf$  tablets twice daily for 7 days. On day 8, switch to one-$RemoveBefor'5mg'IBZSqNzJQWCc$  tablet twice daily. 02/04/20   Johnn Hai, PA-C  diphenhydrAMINE (BENADRYL) 2 % cream Apply topically 3 (three) times daily as needed for itching.    [provider]  enoxaparin (LOVENOX) 40 MG/0.4ML injection Inject 0.4 mLs (40 mg total) into the skin every 12 (twelve) hours for 3 days. 01/27/20 02/04/20  Will Bonnet, MD    Allergies Oxycodone and Penicillins  Family History  Problem Relation Age of Onset  . Cancer Mother        breast  . Diabetes Father   . Hypertension Father   . Heart disease Maternal Grandfather   . Dementia Paternal Grandmother   . Bladder Cancer Neg Hx   . Kidney cancer Neg Hx     Social History Social History   Tobacco Use  . Smoking status: Never Smoker  . Smokeless tobacco: Never Used  Vaping Use  . Vaping Use: Never used  Substance Use Topics  . Alcohol use: Yes    Comment: 1 drink per month  . Drug use: No    Review of Systems Constitutional: No fever/chills Eyes: No visual changes. ENT: No sore throat. Cardiovascular: Denies chest pain. Respiratory: Denies shortness of breath. Gastrointestinal: No abdominal pain.  No nausea, no vomiting.   Genitourinary: Negative for dysuria. Musculoskeletal: Positive for right leg pain. Skin: Negative for rash. Neurological: Negative for headaches, focal weakness or numbness. ____________________________________________   PHYSICAL EXAM:  VITAL SIGNS: ED Triage Vitals [02/04/20 0824]  Enc Vitals Group     BP      Pulse      Resp      Temp      Temp src      SpO2      Weight 300 lb (136.1 kg)     Height $Remov'5\' 5"'tPsRrC$  (1.651 m)     Head  Circumference      Peak Flow      Pain Score 7     Pain Loc      Pain Edu?      Excl. in [place]?     Constitutional: Alert and oriented. Well appearing and in no acute distress.  Patient remains ambulatory and drove herself to the emergency department. Eyes: Conjunctivae are normal.  Head: Atraumatic. Neck: No stridor.   Cardiovascular: Normal rate, regular rhythm. Grossly normal heart sounds.  Good peripheral circulation. Respiratory: Normal respiratory effort.  No retractions. Lungs CTAB. Gastrointestinal: Soft and nontender.  Musculoskeletal: Examination of the right leg there is no gross deformity and no appreciable enlargement in comparison to the left.  There is some minimal warmth noted posteriorly but no erythema or discoloration.  Pulses present bilaterally.  There is sensory function intact.  Bevelyn Buckles' sign was unremarkable. Neurologic:  Normal speech and language. No gross focal neurologic deficits are appreciated.  Skin:  Skin is warm, dry and intact.  No erythema or rash noted on examination of bilateral legs. Psychiatric: Mood and affect are normal. Speech and behavior are normal.  ____________________________________________   LABS (all labs ordered are listed, but only abnormal results are displayed)  Labs Reviewed - No data to display ____________________________________________   RADIOLOGY I, Johnn Hai, personally viewed and evaluated these images (plain radiographs) as part of my medical decision making, as well as reviewing the written report by the radiologist.   Official radiology report(s): US Venous Img Lower Unilateral Right  Result Date: 02/04/2020 CLINICAL DATA:  Right calf pain EXAM: RIGHT LOWER EXTREMITY VENOUS DOPPLER ULTRASOUND TECHNIQUE: Gray-scale sonography with graded compression, as well as color Doppler and duplex ultrasound were performed to evaluate the lower extremity deep venous systems from the level of the common femoral vein and  including the common femoral, femoral, profunda femoral, popliteal and calf veins including the posterior tibial, peroneal and gastrocnemius veins when visible. The superficial great saphenous vein was also interrogated. Spectral Doppler was utilized to evaluate flow at rest and with distal augmentation maneuvers in the common femoral, femoral and popliteal veins. COMPARISON:  None. FINDINGS: Contralateral Common Femoral Vein: Respiratory phasicity is normal and symmetric with the symptomatic side. No evidence of thrombus. Normal compressibility. Common Femoral Vein: No evidence of thrombus. Normal compressibility, respiratory phasicity and response to augmentation. Saphenofemoral Junction: No evidence of thrombus. Normal compressibility and flow on color Doppler imaging. Profunda Femoral Vein: No evidence of thrombus. Normal compressibility and flow on color Doppler imaging. Femoral Vein: No evidence of thrombus. Normal compressibility, respiratory phasicity and response to augmentation. Popliteal Vein: No evidence of thrombus. Normal compressibility, respiratory phasicity and response to augmentation. Calf Veins: Occlusive thrombus within the right posterior tibial vein. Vessel is noncompressible. Peroneal vein patent without evidence of thrombus. Superficial Great Saphenous Vein: Occlusive thrombus within the great saphenous vein from the level of the upper calf extending into the distal thigh. Venous Reflux:  None. Other Findings:  None. IMPRESSION: 1. Positive for occlusive DVT within the right posterior tibial vein. 2. Positive for superficial venous thrombosis of the great saphenous vein from the level of the upper calf extending into the distal right thigh. 3. Negative for DVT above the level of the knee. These results were called by telephone at the time of interpretation on 02/04/2020 at 9:59 am to provider [facility] , who verbally acknowledged these results. Electronically Signed   By: Davina Poke D.O.   On: 02/04/2020 10:00    ____________________________________________   PROCEDURES  Procedure(s) performed (including Critical Care):  Procedures   ____________________________________________   INITIAL IMPRESSION / ASSESSMENT AND PLAN / ED COURSE  As part of my medical decision making, I reviewed the following data within the electronic MEDICAL RECORD NUMBER Notes from prior ED visits and [place] Controlled Substance Database  34 year old female presents to the ED with complaint of discomfort in her right lower extremity that feels similar to when she has had a DVT in her right arm.  Patient reports that she has a clotting disorder and also had 2 surgical procedures done recently.  Patient is non-smoker.  In the past she has been on Lovenox but would prefer to be on a pill.  I spoke with Dr.Siadecki who agreed that patient could be on Eliquis.  The first dose was given to her in the emergency department.  She called her pharmacy and was told that they did not have the starter  pack.  I called the pharmacy and spoke with pharmacist who will dispense 74 tablets with same directions.  Patient requested no narcotics and states that the Tylenol that she has at home will help with pain.  She is encouraged to elevate and use warm compresses.  She is encouraged to return to the emergency department immediately if any severe worsening of her symptoms or urgent concerns.  ____________________________________________   FINAL CLINICAL IMPRESSION(S) / ED DIAGNOSES  Final diagnoses:  Acute deep vein thrombosis (DVT) of tibial vein of right lower extremity [facility])     ED Discharge Orders         Ordered    APIXABAN (ELIQUIS) VTE STARTER PACK (10MG AND 5MG)        02/04/20 1043          *Please note:  Diana Sexton was evaluated in Emergency Department on 02/04/2020 for the symptoms described in the history of present illness. She was evaluated in the context of the global COVID-19  pandemic, which necessitated consideration that the patient might be at risk for infection with the SARS-CoV-2 virus that causes COVID-19. Institutional protocols and algorithms that pertain to the evaluation of patients at risk for COVID-19 are in a state of rapid change based on information released by regulatory bodies including the CDC and federal and state organizations. These policies and algorithms were followed during the patient's care in the ED.  Some ED evaluations and interventions may be delayed as a result of limited staffing during and the pandemic.*   Note:  This document was prepared using Dragon voice recognition software and may include unintentional dictation errors.    Johnn Hai, PA-C 02/04/20 1528    Arta Silence, MD 02/04/20 1531

## 2020-02-04 NOTE — Discharge Instructions (Addendum)
A prescription for Eliquis was sent to your pharmacy.  Begin taking it today.  Also on eighth day will drop down as described on the package label.  Elevate right leg frequently and you may use warm moist heat to the area.  If any severe worsening of your symptoms or urgent concerns return to the emergency department over the holiday.  Follow-up with either your primary care provider or vascular specialist within the 14-day period.

## 2020-02-04 NOTE — TOC Initial Note (Addendum)
Transition of Care [facility]) - Initial/Assessment Note    Patient Details  Name: Diana Sexton MRN: [id number] Date of Birth: Jun 11, 1985  [facility]) CM/SW Contact:    Anselm Pancoast, RN Phone Number: 02/04/2020, 1:18 PM  Clinical Narrative:                 Spoke to patient at bedside. Patient states she was sent back to ED after getting to pharmacy and being told they would not have the Eliquis starter pack until tomorrow. Patient has no other needs and will be driving herself home from ED. TOC provided Eliquis coupon and requested provider call in new prescription with starter pack broken down. Provider confirmed prescription was called in and should be ready for pickup today. Patient provided TOC contact info in the event prescription was not available today. No other needs per patient. Requesting Tylenol for leg pain-RN notified.         Patient Goals and CMS Choice        Expected Discharge Plan and Services                                                Prior Living Arrangements/Services                       Activities of Daily Living      Permission Sought/Granted                  Emotional Assessment              Admission diagnosis:  poss blood clot rt leg Patient Active Problem List   Diagnosis Date Noted  . Acute pelvic pain, female 01/27/2020  . Hematometra 01/27/2020  . 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 Ventral hernia without obstruction or gangrene   . Sprain of left knee 04/14/2019  . DVT (deep venous thrombosis) ([place]) 02/09/2018  . Menorrhagia with irregular cycle 10/13/2016  . Other irritable bowel syndrome 05/02/2016  . Urinary tract infection 01/21/2015  . Anxiety and depression 01/21/2015  . Nephrolithiasis 01/15/2015  . Abnormal radiologic findings on diagnostic imaging of renal pelvis, ureter, or bladder 01/15/2015  . Low HDL (under 40) 01/01/2015  . Elevated LDL cholesterol level  01/01/2015  . Insomnia 12/16/2014  . Morbid obesity ([place]) 12/11/2014  . Hematuria 12/11/2014  . Preventative health care 12/11/2014  . Low back pain 10/13/2014  . Prothrombin gene mutation ([place]) 10/13/2014  . Hydronephrosis 06/07/2012  . Incomplete emptying of bladder 06/07/2012  . Renal colic 19/62/2297  . Ureteric stone 06/07/2012   PCP:  Eulogio Bear, NP Pharmacy:   [facility], [place] - [address] [place] [address] Phone: [phone] Fax: [phone]  [id number] - [place], [address] [place] [address] Phone: [phone] Fax: [phone]     Social Determinants of Health (SDOH) Interventions    Readmission Risk Interventions No flowsheet data found.

## 2020-02-04 NOTE — ED Notes (Signed)
Currently in ultrasound

## 2020-02-04 NOTE — ED Triage Notes (Signed)
Pt reports thinks she has a blood clot in her right leg. Pt reports hx of clotting disorder and has had clots before and it feels the same.

## 2020-02-09 ENCOUNTER — Other Ambulatory Visit: Payer: Self-pay

## 2020-02-09 ENCOUNTER — Ambulatory Visit (INDEPENDENT_AMBULATORY_CARE_PROVIDER_SITE_OTHER): Payer: BC Managed Care – PPO | Admitting: Obstetrics and Gynecology

## 2020-02-09 ENCOUNTER — Encounter: Payer: Self-pay | Admitting: Obstetrics and Gynecology

## 2020-02-09 VITALS — BP 128/74 | Ht 65.0 in | Wt 315.0 lb

## 2020-02-09 DIAGNOSIS — Z09 Encounter for follow-up examination after completed treatment for conditions other than malignant neoplasm: Secondary | ICD-10-CM

## 2020-02-09 DIAGNOSIS — N857 Hematometra: Secondary | ICD-10-CM

## 2020-02-09 NOTE — Progress Notes (Signed)
   Postoperative Follow-up Patient presents post op from ultrasound guided hysteroscopy, dilation and curettage 2 weeks ago for bleeding and pain with hematometra.  Subjective: Patient reports marked improvement in her preop symptoms. Eating a regular diet without difficulty. The patient is not having any pain.  Activity: normal activities of daily living.  Since her surgery she was diagnosed with one superficial venous thrombosis and a less superficial but not quite considered in the deep veins. She is now taking Eliquis 10 mg twice daily and is soon to lower the dose to 5 mg po bid.  She has had no bleeding since a week after her surgery.  Objective: Vitals:   02/09/20 0937  BP: 128/74   Vital Signs: BP 128/74   Ht 5\' 5"  (1.651 m)   Wt (!) 315 lb (142.9 kg)   BMI 52.42 kg/m  Constitutional: Well nourished, well developed female in no acute distress.  HEENT: normal Skin: Warm and dry.  Extremity: no edema    Assessment: 34 y.o. s/p above surgery progressing well  Plan: Patient has done well after surgery with no apparent complications.  I have discussed the post-operative course to date, and the expected progress moving forward.  The patient understands what complications to be concerned about.  I will see the patient in routine follow up, or sooner if needed.    Activity plan: No restriction.  Will monitor her bleeding. If is tolerable, will continue to monitor. If not, will consider next treatment step, which will likely be a hysterectomy.   Return in about 6 months (around 08/09/2020) for Annual Gynecologic Examination (6 months to 1 year) with Dr. 08/11/2020.   Jean Rosenthal, MD 02/09/2020, 10:27 AM

## 2020-02-14 ENCOUNTER — Encounter: Payer: Self-pay | Admitting: Nurse Practitioner

## 2020-02-14 DIAGNOSIS — R7301 Impaired fasting glucose: Secondary | ICD-10-CM | POA: Insufficient documentation

## 2020-02-18 ENCOUNTER — Encounter: Payer: Self-pay | Admitting: Nurse Practitioner

## 2020-02-18 ENCOUNTER — Telehealth (INDEPENDENT_AMBULATORY_CARE_PROVIDER_SITE_OTHER): Payer: BC Managed Care – PPO | Admitting: Nurse Practitioner

## 2020-02-18 ENCOUNTER — Ambulatory Visit: Payer: BC Managed Care – PPO | Admitting: Nurse Practitioner

## 2020-02-18 VITALS — Temp 99.4°F | Wt 315.0 lb

## 2020-02-18 DIAGNOSIS — I82441 Acute embolism and thrombosis of right tibial vein: Secondary | ICD-10-CM | POA: Diagnosis not present

## 2020-02-18 DIAGNOSIS — D6852 Prothrombin gene mutation: Secondary | ICD-10-CM

## 2020-02-18 NOTE — Assessment & Plan Note (Signed)
Followed by hematology, continue this collaboration. 

## 2020-02-18 NOTE — Progress Notes (Signed)
Temp 99.4 F (37.4 C) (Oral)   Wt (!) 315 lb (142.9 kg)   BMI 52.42 kg/m    Subjective:    Patient ID: Diana Sexton, female    DOB: December 07, 1985, 35 y.o.   MRN: [id number]  HPI: Diana Sexton is a 35 y.o. female  Chief Complaint  Patient presents with  . Hospitalization Follow-up    2 blood clots in right lower leg and behind knee.     . This visit was completed via MyChart due to the restrictions of the COVID-19 pandemic. All issues as above were discussed and addressed. Physical exam was done as above through visual confirmation on MyChart. If it was felt that the patient should be evaluated in the office, they were directed there. The patient verbally consented to this visit. . Location of the patient: home . Location of the provider: work . Those involved with this call:  . Provider: Aura Dials, DNP . CMA: Wilhemena Durie, CMA . Front Desk/Registration: Harriet Pho  . Time spent on call: 21 minutes with patient face to face via video conference. More than 50% of this time was spent in counseling and coordination of care. 15 minutes total spent in review of patient's record and preparation of their chart.  . I verified patient identity using two factors (patient name and date of birth). Patient consents verbally to being seen via telemedicine visit today.    ER FOLLOW UP Follow-up for ER visit on 02/04/20, diagnosed with two acute DVT of tibial vein of right lower extremity.  Has underlying prothrombin gene mutation, is followed by hematology.  Has history of x 4 blood clots -- right arm 2013, elbow right arm 2019, and then two last December 2021.  They started her on Eliquis in ER, which causes headaches -- reports this is past experience as well with these.  Is seeing hematology on Friday.  Her father has had similar issues and failed multiple medications to prevent clots.  He gets clots mainly in legs. She denies any swelling or pain to right extremity.  No further  warmth to extremity.   Time since discharge: 02/04/2020 Hospital/facility: [facility] Diagnosis: acute DVT of tibial vein of right lower extremity Procedures/tests: imaging and labs Consultants: none New medications: Eliquis Discharge instructions:  Follow-up with PCP and hematology Status: stable at this time  Relevant past medical, surgical, family and social history reviewed and updated as indicated. Interim medical history since our last visit reviewed. Allergies and medications reviewed and updated.  Review of Systems  Constitutional: Negative for activity change, appetite change, diaphoresis, fatigue and fever.  Respiratory: Negative for cough, chest tightness and shortness of breath.   Cardiovascular: Negative for chest pain, palpitations and leg swelling.  Gastrointestinal: Negative.   Neurological: Negative.   Psychiatric/Behavioral: Negative.     Per HPI unless specifically indicated above     Objective:    Temp 99.4 F (37.4 C) (Oral)   Wt (!) 315 lb (142.9 kg)   BMI 52.42 kg/m   Wt Readings from Last 3 Encounters:  02/18/20 (!) 315 lb (142.9 kg)  02/09/20 (!) 315 lb (142.9 kg)  02/04/20 300 lb (136.1 kg)    Physical Exam Vitals and nursing note reviewed.  Constitutional:      General: She is awake. She is not in acute distress.    Appearance: She is well-developed. She is not ill-appearing.  HENT:     Head: Normocephalic.     Right Ear: Hearing normal.  Left Ear: Hearing normal.  Eyes:     General: Lids are normal.        Right eye: No discharge.        Left eye: No discharge.     Conjunctiva/sclera: Conjunctivae normal.  Cardiovascular:     Comments: Visual exam via virtual, no erythema or edema RLE.  Patient reports no warmth and normal pulses. Pulmonary:     Effort: Pulmonary effort is normal. No accessory muscle usage or respiratory distress.  Musculoskeletal:     Cervical back: Normal range of motion.     Right lower leg: No edema.     Left  lower leg: No edema.  Neurological:     Mental Status: She is alert and oriented to person, place, and time.  Psychiatric:        Attention and Perception: Attention normal.        Mood and Affect: Mood normal.        Behavior: Behavior normal. Behavior is cooperative.        Thought Content: Thought content normal.        Judgment: Judgment normal.     Results for orders placed or performed during the hospital encounter of 01/27/20  Pregnancy, urine POC  Result Value Ref Range   Preg Test, Ur NEGATIVE NEGATIVE  Surgical pathology  Result Value Ref Range   SURGICAL PATHOLOGY      SURGICAL PATHOLOGY CASE: [id number] PATIENT: Beola Cord Surgical Pathology Report     Specimen Submitted: A. Endometrial curettings  Clinical History: Hematometra, Pelvic pain      DIAGNOSIS: A. ENDOMETRIUM, CURETTAGE: - SCANT FRAGMENTS OF DEGENERATED EPITHELIUM IN A BACKGROUND OF ABUNDANT BLOOD AND INFLAMMATORY DEBRIS. - SEE COMMENT.  Comment: There is no viable endometrial tissue present to evaluate for malignancy.  Clinical correlation is recommended.   GROSS DESCRIPTION: A. Labeled: Endometrial curettings Received: Formalin Tissue fragment(s): Multiple Size: Aggregate, 2.8 x 2.4 x 0.3 cm Description: Received on a Telfa pad and within a mesh bag are fragments of red-brown blood clot admixed with a scant amount of tan-pink soft tissue. Entirely submitted in 1 cassette.    Final Diagnosis performed by Betsy Pries, MD.   Electronically signed 01/28/2020 1:13:43PM The electronic signature indicates that the named Attending Pathologist  has evaluated the specimen Technical component performed at [facility], [address], [place], [place] [address] Lab: [id number] Dir: Rush Farmer, MD, MMM  Professional component performed at [facility], [facility], [place], [place], [place] [address] Lab: [phone] Dir: Dellia Nims. Rubinas, MD        Assessment & Plan:   Problem List Items Addressed This Visit      Cardiovascular and Mediastinum   DVT (deep venous thrombosis) ([place]) - Primary    Acute x 2 to RLE with underlying genetic disorder.  At this time continue Eliquis and appointment with hematology.  Will defer changes to hematology at this time due to lengthy history with genetic disorder and clotting risks.  Review note once completed.  Appreciate their input.  Return in 6 months to meet new PCP.        Other   Prothrombin gene mutation ([place])    Followed by hematology, continue this collaboration.         I discussed the assessment and treatment plan with the patient. The patient was provided an opportunity to ask questions and all were answered. The patient agreed with the plan and demonstrated an understanding of the instructions.  The patient was advised to call back or seek an in-person evaluation if the symptoms worsen or if the condition fails to improve as anticipated.   I provided 21+ minutes of time during this encounter.  Follow up plan: Return in about 6 months (around 08/17/2020) for DVT and chronic disease visit with new PCP.

## 2020-02-18 NOTE — Assessment & Plan Note (Signed)
Acute x 2 to RLE with underlying genetic disorder.  At this time continue Eliquis and appointment with hematology.  Will defer changes to hematology at this time due to lengthy history with genetic disorder and clotting risks.  Review note once completed.  Appreciate their input.  Return in 6 months to meet new PCP.

## 2020-02-18 NOTE — Patient Instructions (Signed)

## 2020-02-19 ENCOUNTER — Encounter: Payer: Self-pay | Admitting: Oncology

## 2020-02-19 NOTE — Telephone Encounter (Signed)
Do you want to move her out a few weeks?  Durenda Hurt, NP 02/19/2020 2:42 PM

## 2020-02-20 ENCOUNTER — Encounter: Payer: Self-pay | Admitting: Oncology

## 2020-02-20 ENCOUNTER — Inpatient Hospital Stay: Payer: BC Managed Care – PPO | Attending: Oncology | Admitting: Oncology

## 2020-02-20 DIAGNOSIS — D6852 Prothrombin gene mutation: Secondary | ICD-10-CM

## 2020-02-20 MED ORDER — RIVAROXABAN 15 MG PO TABS: 15.0000 mg | ORAL_TABLET | Freq: Two times a day (BID) | ORAL | 2 refills | Status: DC

## 2020-02-20 NOTE — Progress Notes (Signed)
Patient pre-assessment completed for mychart visit. Patient was recently seen in the ED for new DVT, started on Eliquis. Patient denies any concerns today but states she does need someone to refill her Eliquis.

## 2020-02-20 NOTE — Progress Notes (Signed)
[place]  [phone] [phone]  ID: Diana Sexton OB: Dec 20, 1985  MR#: [id number]  XIP#:[id number]  Patient Care Team: Diana Bear, NP as PCP - General (Nurse Practitioner)  I connected with Diana Sexton on 02/20/20 at 10:00 AM EST by video enabled telemedicine visit and verified that I am speaking with the correct person using two identifiers.   I discussed the limitations, risks, security and privacy concerns of performing an evaluation and management service by telemedicine and the availability of in-person appointments. I also discussed with the patient that there may be a patient responsible charge related to this service. The patient expressed understanding and agreed to proceed.   Other persons participating in the visit and their role in the encounter: Patient, MD  Patient's location: Home Provider's location: Clinic  CHIEF COMPLAINT: Heterozygous for prothrombin gene mutation.  INTERVAL HISTORY: Diana Sexton is a 35 year old female with past medical history significant for heterozygous prothrombin gene mutation even with 2 previous blood clots one while she was pregnant and one thought to be unprovoked.  She was on Eliquis and decided to discontinue secondary to financial cost back in jun 2020.   She had a ventral hernia repaired by Diana Sexton on 01/06/20.  Developed lesion around her incisional site concerning for surgical site infection versus allergic reaction.  She was given a prescription for Bactrim with resolution.   She was evaluated in the emergency room on 01/20/2020 for abdominal pain and constipation.  There was some concern for small bowel obstruction which was subsequently ruled out.  CT abdomen showed thickening of her endometrium.  Vaginal ultrasound showed collection in the uterus consistent with hematometra.  Plan was for either D&C versus hysterectomy.  She met with Diana Sexton on 01/22/2020 who  recommended D&C in order to attempt evacuation provide some relief.  May require hysterectomy if unsuccessful.  She was evaluated again in the emergency room on 01/26/2020 for abdominal pain.  She was given IV pain medication and sent home.  Had D&C on 01/27/2020.  Procedure was successful.  She then presented back on 02/04/2020 for right leg pain.  Found to have DVT of right tibial vein lower extremity.  She was started back on Eliquis.   Today she presents back via virtual visit secondary to testing positive for Covid this past Wednesday. Only mild covid symptoms. She reports having developed a headache since beginning Xarelto.  States this was a symptom she had previously while on Xarelto.  She is wondering if there is another anticoagulant she could try other than Coumadin. She was on coumadin and required frequent lab draws and is not interested in trying this again. Pain has subsided.  She denies any shortness of breath or additional pain including headaches, nausea, vomiting, constipation or diarrhea.  She has been compliant with her Eliquis.  REVIEW OF SYSTEMS:   Review of Systems  Constitutional: Negative.  Negative for fever, malaise/fatigue and weight loss.  Respiratory: Negative.  Negative for cough and shortness of breath.   Cardiovascular: Positive for leg swelling. Negative for chest pain.  Gastrointestinal: Negative.  Negative for abdominal pain, blood in stool and melena.  Genitourinary: Negative.  Negative for hematuria.  Musculoskeletal: Negative.  Negative for back pain.  Skin: Negative.  Negative for rash.  Neurological: Positive for headaches. Negative for focal weakness and weakness.  Psychiatric/Behavioral: Negative.  The patient is not nervous/anxious.     As per HPI. Otherwise, a complete review of systems is negative.  PAST MEDICAL HISTORY: Past Medical History:  Diagnosis Date  . Anemia   . Anxiety   . Cellulitis 2012   caused stillbirth  . DVT (deep venous  thrombosis) ([place]) 06/2011   under right clavicle  . Dysmenorrhea   . Family history of breast cancer 11/2013   BRCA/MyRisk neg  . H/O blood clots   . Headache    related to hormonal meds  . Hernia, abdominal   . History of kidney stones   . History of prothrombin mutation   . Increased risk of breast cancer 11/2013   IBIS=27%  . Obesity     PAST SURGICAL HISTORY: Past Surgical History:  Procedure Laterality Date  . CHOLECYSTECTOMY  2007  . CYSTOSCOPY W/ RETROGRADES Left 02/02/2017   Procedure: CYSTOSCOPY WITH RETROGRADE PYELOGRAM;  Surgeon: Abbie Sons, MD;  Location: [facility];  Service: Urology;  Laterality: Left;  . CYSTOSCOPY W/ RETROGRADES Right 06/26/2019   Procedure: CYSTOSCOPY WITH RETROGRADE PYELOGRAM;  Surgeon: Abbie Sons, MD;  Location: [facility];  Service: Urology;  Laterality: Right;  . CYSTOSCOPY/URETEROSCOPY/HOLMIUM LASER/STENT PLACEMENT Left 02/02/2017   Procedure: CYSTOSCOPY/URETEROSCOPY/HOLMIUM LASER/STENT PLACEMENT;  Surgeon: Abbie Sons, MD;  Location: [facility];  Service: Urology;  Laterality: Left;  . CYSTOSCOPY/URETEROSCOPY/HOLMIUM LASER/STENT PLACEMENT Right 06/26/2019   Procedure: CYSTOSCOPY/URETEROSCOPY/HOLMIUM LASER/STENT PLACEMENT;  Surgeon: Abbie Sons, MD;  Location: [facility];  Service: Urology;  Laterality: Right;  . DILITATION & CURRETTAGE/HYSTROSCOPY WITH ESSURE    . DILITATION & CURRETTAGE/HYSTROSCOPY WITH NOVASURE ABLATION N/A 12/24/2018   Procedure: DILATATION & CURETTAGE/HYSTEROSCOPY WITH ENDOMETRIAL ABLATION (MINERVA SYSTEM);  Surgeon: Will Bonnet, MD;  Location: [facility];  Service: Gynecology;  Laterality: N/A;  . HYSTEROSCOPY WITH D & C N/A 01/27/2020   Procedure: DILATATION AND CURETTAGE /HYSTEROSCOPY;  Surgeon: Will Bonnet, MD;  Location: [facility];  Service: Gynecology;  Laterality: N/A;  . INTRAUTERINE DEVICE (IUD) INSERTION    . LITHOTRIPSY    . TONSILLECTOMY    . TONSILLECTOMY AND ADENOIDECTOMY    . TUBAL  LIGATION  2014  . XI ROBOTIC ASSISTED VENTRAL HERNIA N/A 01/06/2020   Procedure: XI ROBOTIC ASSISTED VENTRAL HERNIA;  Surgeon: Olean Ree, MD;  Location: [facility];  Service: General;  Laterality: N/A;    FAMILY HISTORY: Family History  Problem Relation Age of Onset  . Cancer Mother        breast  . Stroke Mother   . Diabetes Father   . Hypertension Father   . Heart disease Maternal Grandfather   . Dementia Paternal Grandmother   . Bladder Cancer Neg Hx   . Kidney cancer Neg Hx     ADVANCED DIRECTIVES (Y/N):  N  HEALTH MAINTENANCE: Social History   Tobacco Use  . Smoking status: Never Smoker  . Smokeless tobacco: Never Used  Vaping Use  . Vaping Use: Never used  Substance Use Topics  . Alcohol use: Yes    Comment: 1 drink per month  . Drug use: No     Colonoscopy:  PAP:  Bone density:  Lipid panel:  Allergies  Allergen Reactions  . Oxycodone Rash  . Penicillins Rash    Has had a PCN reaction causing, facial/tongue/throat swelling, SOB or lightheadedness with hypotension: NO Has had a PCN reaction causing severe rash involving mucus membranes or skin necrosis: NO Has had a PCN reaction that required hospitalization: No Has had a PCN reaction occurring within the last 10 years: Yes If all of the above answers are "NO", then may proceed  with Cephalosporin use. Updated 01/06/20; Reaction was low severity rash. Ancef given. No reaction     Current Outpatient Medications  Medication Sig Dispense Refill  . APIXABAN (ELIQUIS) VTE STARTER PACK (10MG AND 5MG) Take as directed on package: start with two-88m tablets twice daily for 7 days. On day 8, switch to one-564mtablet twice daily. 1 each 0  . diphenhydrAMINE (BENADRYL) 2 % cream Apply topically 3 (three) times daily as needed for itching.     No current facility-administered medications for this visit.    OBJECTIVE: There were no vitals filed for this visit.   There is no height or weight on file to calculate  BMI.    ECOG FS:0 - Asymptomatic Physical Exam Limited secondary to telephone call. Patient in no acute distress. Speaking in full sentences.  LAB RESULTS:  Lab Results  Component Value Date   NA 139 01/26/2020   K 4.1 01/26/2020   CL 104 01/26/2020   CO2 25 01/26/2020   GLUCOSE 109 (H) 01/26/2020   BUN 16 01/26/2020   CREATININE 0.62 01/26/2020   CALCIUM 9.3 01/26/2020   PROT 7.7 01/26/2020   ALBUMIN 3.7 01/26/2020   AST 18 01/26/2020   ALT 15 01/26/2020   ALKPHOS 66 01/26/2020   BILITOT 0.6 01/26/2020   GFRNONAA >60 01/26/2020   GFRAA >60 06/27/2019    Lab Results  Component Value Date   WBC 8.6 01/26/2020   NEUTROABS 5.9 01/27/2017   HGB 12.6 01/26/2020   HCT 38.3 01/26/2020   MCV 76.8 (L) 01/26/2020   PLT 400 01/26/2020     STUDIES: [place]enous Img Lower Unilateral Right  Result Date: 02/04/2020 CLINICAL DATA:  Right calf pain EXAM: RIGHT LOWER EXTREMITY VENOUS DOPPLER ULTRASOUND TECHNIQUE: Gray-scale sonography with graded compression, as well as color Doppler and duplex ultrasound were performed to evaluate the lower extremity deep venous systems from the level of the common femoral vein and including the common femoral, femoral, profunda femoral, popliteal and calf veins including the posterior tibial, peroneal and gastrocnemius veins when visible. The superficial great saphenous vein was also interrogated. Spectral Doppler was utilized to evaluate flow at rest and with distal augmentation maneuvers in the common femoral, femoral and popliteal veins. COMPARISON:  None. FINDINGS: Contralateral Common Femoral Vein: Respiratory phasicity is normal and symmetric with the symptomatic side. No evidence of thrombus. Normal compressibility. Common Femoral Vein: No evidence of thrombus. Normal compressibility, respiratory phasicity and response to augmentation. Saphenofemoral Junction: No evidence of thrombus. Normal compressibility and flow on color Doppler imaging. Profunda  Femoral Vein: No evidence of thrombus. Normal compressibility and flow on color Doppler imaging. Femoral Vein: No evidence of thrombus. Normal compressibility, respiratory phasicity and response to augmentation. Popliteal Vein: No evidence of thrombus. Normal compressibility, respiratory phasicity and response to augmentation. Calf Veins: Occlusive thrombus within the right posterior tibial vein. Vessel is noncompressible. Peroneal vein patent without evidence of thrombus. Superficial Great Saphenous Vein: Occlusive thrombus within the great saphenous vein from the level of the upper calf extending into the distal thigh. Venous Reflux:  None. Other Findings:  None. IMPRESSION: 1. Positive for occlusive DVT within the right posterior tibial vein. 2. Positive for superficial venous thrombosis of the great saphenous vein from the level of the upper calf extending into the distal right thigh. 3. Negative for DVT above the level of the knee. These results were called by telephone at the time of interpretation on 02/04/2020 at 9:59 am to provider [facility] who verbally acknowledged  these results. Electronically Signed   By: Davina Poke D.O.   On: 02/04/2020 10:00    ASSESSMENT: Heterozygous for prothrombin gene mutation  PLAN:    1. Heterozygous for prothrombin gene mutation:  -Remainder of her hypercoagulable work-up was negative.   -She is approximately 2-3 times risk of developing pulmonary embolism and DVT over the general population.   -Previously had 2 blood clots-one unprovoked and one provoked -She was on Eliquis but stopped (07/2018) secondary to financial burden.  2.  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 -Developed right tibial vein DVT. -Started back on Eliquis on 02/05/2020 -Has been compliant but has noticed headaches. -Spoke with Dr. [name] and okay to transition to  Xarelto to see if she is able to tolerate. -Will start Xarelto 15 mg BID.  -We will have her return to clinic in approximately 3 months to see if she is tolerating.   Disposition: -RX Xarelto 15 mg bid sent to pharmacy.  -RTC in 3 months to assess toleration of Xarelto  I provided 25 minutes of non face-to-face telephone visit time during this encounter, and > 50% was spent counseling as documented under my assessment & plan.  Patient expressed understanding and was in agreement with this plan. She also understands that She can call clinic at any time with any questions, concerns, or complaints.    Jacquelin Hawking, NP   02/20/2020 10:01 AM

## 2020-05-13 NOTE — Progress Notes (Deleted)
[facility]  Telephone:(336) [phone] [phone]) [phone]  ID: Diana Sexton OB: Aug 05, 1985  MR#: [id number]  GUR#:[id number]  Patient Care Team: Charyl Dancer, NP as PCP - General (Internal Medicine)  I connected with Diana Sexton on 05/13/20 at  2:45 PM EDT by {Blank single:19197::"video enabled telemedicine visit","telephone visit"} and verified that I am speaking with the correct person using two identifiers.   I discussed the limitations, risks, security and privacy concerns of performing an evaluation and management service by telemedicine and the availability of in-person appointments. I also discussed with the patient that there may be a patient responsible charge related to this service. The patient expressed understanding and agreed to proceed.   Other persons participating in the visit and their role in the encounter: Patient, MD.  Patient's location: Home. Provider's location: Clinic.  CHIEF COMPLAINT: Heterozygous for prothrombin gene mutation.  INTERVAL HISTORY: Patient agreed to video enabled telemedicine visit for further evaluation and discussion on whether to continue anticoagulation.  Patient states secondary to financial cost, she discontinued Eliquis approximately 1 month ago.  She currently feels well and is asymptomatic.  She has no neurologic complaints.  She denies any recent fevers or illnesses.  She has a good appetite and denies weight loss.  She has no chest pain, shortness of breath, cough, or hemoptysis.  She has no nausea, vomiting, constipation, or diarrhea.  She has no urinary complaints.  Patient feels at her baseline offers no specific complaints today.  REVIEW OF SYSTEMS:   Review of Systems  Constitutional: Negative.  Negative for fever, malaise/fatigue and weight loss.  Respiratory: Negative.  Negative for cough and shortness of breath.   Cardiovascular: Negative.  Negative for chest pain and leg swelling.   Gastrointestinal: Negative.  Negative for abdominal pain, blood in stool and melena.  Genitourinary: Negative.  Negative for hematuria.  Musculoskeletal: Negative.  Negative for back pain.  Skin: Negative.  Negative for rash.  Neurological: Negative.  Negative for focal weakness, weakness and headaches.  Psychiatric/Behavioral: Negative.  The patient is not nervous/anxious.     As per HPI. Otherwise, a complete review of systems is negative.  PAST MEDICAL HISTORY: Past Medical History:  Diagnosis Date  . Anemia   . Anxiety   . Cellulitis 2012   caused stillbirth  . DVT (deep venous thrombosis) ([place]) 06/2011   under right clavicle  . Dysmenorrhea   . Family history of breast cancer 11/2013   BRCA/MyRisk neg  . H/O blood clots   . Headache    related to hormonal meds  . Hernia, abdominal   . History of kidney stones   . History of prothrombin mutation   . Increased risk of breast cancer 11/2013   IBIS=27%  . Obesity     PAST SURGICAL HISTORY: Past Surgical History:  Procedure Laterality Date  . CHOLECYSTECTOMY  2007  . CYSTOSCOPY W/ RETROGRADES Left 02/02/2017   Procedure: CYSTOSCOPY WITH RETROGRADE PYELOGRAM;  Surgeon: Abbie Sons, MD;  Location: [facility];  Service: Urology;  Laterality: Left;  . CYSTOSCOPY W/ RETROGRADES Right 06/26/2019   Procedure: CYSTOSCOPY WITH RETROGRADE PYELOGRAM;  Surgeon: Abbie Sons, MD;  Location: [facility];  Service: Urology;  Laterality: Right;  . CYSTOSCOPY/URETEROSCOPY/HOLMIUM LASER/STENT PLACEMENT Left 02/02/2017   Procedure: CYSTOSCOPY/URETEROSCOPY/HOLMIUM LASER/STENT PLACEMENT;  Surgeon: Abbie Sons, MD;  Location: [facility];  Service: Urology;  Laterality: Left;  . CYSTOSCOPY/URETEROSCOPY/HOLMIUM LASER/STENT PLACEMENT Right 06/26/2019   Procedure: CYSTOSCOPY/URETEROSCOPY/HOLMIUM LASER/STENT PLACEMENT;  Surgeon: Abbie Sons,  MD;  Location: [facility];  Service: Urology;  Laterality: Right;  . DILITATION &  CURRETTAGE/HYSTROSCOPY WITH ESSURE    . DILITATION & CURRETTAGE/HYSTROSCOPY WITH NOVASURE ABLATION N/A 12/24/2018   Procedure: DILATATION & CURETTAGE/HYSTEROSCOPY WITH ENDOMETRIAL ABLATION (MINERVA SYSTEM);  Surgeon: Will Bonnet, MD;  Location: [facility];  Service: Gynecology;  Laterality: N/A;  . HYSTEROSCOPY WITH D & C N/A 01/27/2020   Procedure: DILATATION AND CURETTAGE /HYSTEROSCOPY;  Surgeon: Will Bonnet, MD;  Location: [facility];  Service: Gynecology;  Laterality: N/A;  . INTRAUTERINE DEVICE (IUD) INSERTION    . LITHOTRIPSY    . TONSILLECTOMY    . TONSILLECTOMY AND ADENOIDECTOMY    . TUBAL LIGATION  2014  . XI ROBOTIC ASSISTED VENTRAL HERNIA N/A 01/06/2020   Procedure: XI ROBOTIC ASSISTED VENTRAL HERNIA;  Surgeon: Olean Ree, MD;  Location: [facility];  Service: General;  Laterality: N/A;    FAMILY HISTORY: Family History  Problem Relation Age of Onset  . Cancer Mother        breast  . Stroke Mother   . Diabetes Father   . Hypertension Father   . Heart disease Maternal Grandfather   . Dementia Paternal Grandmother   . Bladder Cancer Neg Hx   . Kidney cancer Neg Hx     ADVANCED DIRECTIVES (Y/N):  N  HEALTH MAINTENANCE: Social History   Tobacco Use  . Smoking status: Never Smoker  . Smokeless tobacco: Never Used  Vaping Use  . Vaping Use: Never used  Substance Use Topics  . Alcohol use: Yes    Comment: 1 drink per month  . Drug use: No     Colonoscopy:  PAP:  Bone density:  Lipid panel:  Allergies  Allergen Reactions  . Oxycodone Rash  . Penicillins Rash    Has had a PCN reaction causing, facial/tongue/throat swelling, SOB or lightheadedness with hypotension: NO Has had a PCN reaction causing severe rash involving mucus membranes or skin necrosis: NO Has had a PCN reaction that required hospitalization: No Has had a PCN reaction occurring within the last 10 years: Yes If all of the above answers are "NO", then may proceed with Cephalosporin  use. Updated 01/06/20; Reaction was low severity rash. Ancef given. No reaction     Current Outpatient Medications  Medication Sig Dispense Refill  . APIXABAN (ELIQUIS) VTE STARTER PACK ($RemoveBefor'10MG'WSlsKFhfsqcN$  AND $Re'5MG'Ofu$ ) Take as directed on package: start with two-$RemoveBefore'5mg'aSLMimNBGKTGu$  tablets twice daily for 7 days. On day 8, switch to one-$RemoveBefor'5mg'eCFMLQRPWHfU$  tablet twice daily. 1 each 0  . diphenhydrAMINE (BENADRYL) 2 % cream Apply topically 3 (three) times daily as needed for itching.    . Rivaroxaban (XARELTO) 15 MG TABS tablet Take 1 tablet (15 mg total) by mouth 2 (two) times daily with a meal. 42 tablet 2   No current facility-administered medications for this visit.    OBJECTIVE: There were no vitals filed for this visit.   There is no height or weight on file to calculate BMI.    ECOG FS:0 - Asymptomatic  General: Well-developed, well-nourished, no acute distress. HEENT: Normocephalic. Neuro: Alert, answering all questions appropriately. Cranial nerves grossly intact. Skin: No rashes or petechiae noted. Psych: Normal affect.  LAB RESULTS:  Lab Results  Component Value Date   NA 139 01/26/2020   K 4.1 01/26/2020   CL 104 01/26/2020   CO2 25 01/26/2020   GLUCOSE 109 (H) 01/26/2020   BUN 16 01/26/2020   CREATININE 0.62 01/26/2020   CALCIUM 9.3 01/26/2020  PROT 7.7 01/26/2020   ALBUMIN 3.7 01/26/2020   AST 18 01/26/2020   ALT 15 01/26/2020   ALKPHOS 66 01/26/2020   BILITOT 0.6 01/26/2020   GFRNONAA >60 01/26/2020   GFRAA >60 06/27/2019    Lab Results  Component Value Date   WBC 8.6 01/26/2020   NEUTROABS 5.9 01/27/2017   HGB 12.6 01/26/2020   HCT 38.3 01/26/2020   MCV 76.8 (L) 01/26/2020   PLT 400 01/26/2020     STUDIES: No results found.  ASSESSMENT: Heterozygous for prothrombin gene mutation  PLAN:    1. Heterozygous for prothrombin gene mutation: The remainder of her hypercoagulable work-up was negative.  Patient is at approximately 2-3 times risk of developing pulmonary embolism and DVT over the  general population.  This is now her second blood clot, although first without inciting factor.  Her initial blood clot occurred during pregnancy which is a known risk factor.  After lengthy discussion with the patient, she expressed understanding of the risks and benefits of discontinuing Eliquis versus lifelong anticoagulation.  Patient wishes to discontinue treatment at this time, but did agree if she ever had a third clot for any reason at that point she would require lifelong anticoagulation.  We also discussed the possibility of prophylactic Lovenox for extended travel or other predictable risk factors.  Patient will return to clinic on an as-needed basis for discussion of temporary anticoagulation if needed.   I provided *** minutes of {Blank single:19197::"face-to-face video visit time","non face-to-face telephone visit time"} during this encounter which included chart review, counseling, and coordination of care as documented above.    Patient expressed understanding and was in agreement with this plan. She also understands that She can call clinic at any time with any questions, concerns, or complaints.    Lloyd Huger, MD   05/13/2020 1:14 PM

## 2020-05-20 ENCOUNTER — Inpatient Hospital Stay: Payer: BC Managed Care – PPO | Admitting: Oncology

## 2020-05-20 DIAGNOSIS — D6852 Prothrombin gene mutation: Secondary | ICD-10-CM

## 2020-06-04 NOTE — Progress Notes (Signed)
[facility]  Telephone:(336) [phone] Fax:(336) [phone]  ID: Diana Sexton OB: [birth year]  MR#: [id number]  CSN#:[id number]  Patient Care Team: Charyl Dancer, NP as PCP - General (Internal Medicine)  I connected with Diana Sexton on 06/09/20 at  1:30 PM EDT by video enabled telemedicine visit and verified that I am speaking with the correct person using two identifiers.   I discussed the limitations, risks, security and privacy concerns of performing an evaluation and management service by telemedicine and the availability of in-person appointments. I also discussed with the patient that there may be a patient responsible charge related to this service. The patient expressed understanding and agreed to proceed.   Other persons participating in the visit and their role in the encounter: Patient, MD.  Patient's location: Home. Provider's location: Clinic.  CHIEF COMPLAINT: Heterozygous for prothrombin gene mutation.  INTERVAL HISTORY: Patient agreed to video enabled telemedicine visit for routine 40-monthevaluation and to assess her toleration of Eliquis.  She had a repeat blood clot back in December 2021 and subsequently reinitiated Eliquis which she will now take lifelong.  Patient states she was moving recently and has not taken treatment in nearly 2 months.  She continues to feel well and remains asymptomatic. She has no neurologic complaints.  She denies any recent fevers or illnesses.  She has a good appetite and denies weight loss.  She has no chest pain, shortness of breath, cough, or hemoptysis.  She has no nausea, vomiting, constipation, or diarrhea.  She has no urinary complaints.  Patient offers no specific complaints today.  REVIEW OF SYSTEMS:   Review of Systems  Constitutional: Negative.  Negative for fever, malaise/fatigue and weight loss.  Respiratory: Negative.  Negative for cough and shortness of breath.   Cardiovascular: Negative.  Negative  for chest pain and leg swelling.  Gastrointestinal: Negative.  Negative for abdominal pain, blood in stool and melena.  Genitourinary: Negative.  Negative for hematuria.  Musculoskeletal: Negative.  Negative for back pain.  Skin: Negative.  Negative for rash.  Neurological: Negative.  Negative for focal weakness, weakness and headaches.  Psychiatric/Behavioral: Negative.  The patient is not nervous/anxious.     As per HPI. Otherwise, a complete review of systems is negative.  PAST MEDICAL HISTORY: Past Medical History:  Diagnosis Date  . Anemia   . Anxiety   . Cellulitis 2012   caused stillbirth  . D[place] 06[place]   under right clavicle  . Dysmenorrhea   . Family history of breast cancer 11/2013   BRCA/MyRisk neg  . H/O blood clots   . Headache    related to hormonal meds  . Hernia, abdominal   . History of kidney stones   . History of prothrombin mutation   . Increased risk of breast cancer 11/2013   IBIS=27%  . Obesity     PAST SURGICAL HISTORY: Past Surgical History:  Procedure Laterality Date  . CHOLECYSTECTOMY  2007  . CYSTOSCOPY W/ RETROGRADES Left 02/02/2017   Procedure: CYSTOSCOPY WITH RETROGRADE PYELOGRAM;  Surgeon: SAbbie Sons MD;  Location: [facility];  Service: Urology;  Laterality: Left;  . CYSTOSCOPY W/ RETROGRADES Right 06/26/2019   Procedure: CYSTOSCOPY WITH RETROGRADE PYELOGRAM;  Surgeon: SAbbie Sons MD;  Location: [facility];  Service: Urology;  Laterality: Right;  . CYSTOSCOPY/URETEROSCOPY/HOLMIUM LASER/STENT PLACEMENT Left 02/02/2017   Procedure: CYSTOSCOPY/URETEROSCOPY/HOLMIUM LASER/STENT PLACEMENT;  Surgeon: SAbbie Sons MD;  Location: [facility];  Service: Urology;  Laterality: Left;  .  CYSTOSCOPY/URETEROSCOPY/HOLMIUM LASER/STENT PLACEMENT Right 06/26/2019   Procedure: CYSTOSCOPY/URETEROSCOPY/HOLMIUM LASER/STENT PLACEMENT;  Surgeon: Abbie Sons, MD;  Location: [facility];  Service: Urology;  Laterality: Right;   . DILITATION & CURRETTAGE/HYSTROSCOPY WITH ESSURE    . DILITATION & CURRETTAGE/HYSTROSCOPY WITH NOVASURE ABLATION N/A 12/24/2018   Procedure: DILATATION & CURETTAGE/HYSTEROSCOPY WITH ENDOMETRIAL ABLATION (MINERVA SYSTEM);  Surgeon: Will Bonnet, MD;  Location: [facility];  Service: Gynecology;  Laterality: N/A;  . HYSTEROSCOPY WITH D & C N/A 01/27/2020   Procedure: DILATATION AND CURETTAGE /HYSTEROSCOPY;  Surgeon: Will Bonnet, MD;  Location: [facility];  Service: Gynecology;  Laterality: N/A;  . INTRAUTERINE DEVICE (IUD) INSERTION    . LITHOTRIPSY    . TONSILLECTOMY    . TONSILLECTOMY AND ADENOIDECTOMY    . TUBAL LIGATION  2014  . XI ROBOTIC ASSISTED VENTRAL HERNIA N/A 01/06/2020   Procedure: XI ROBOTIC ASSISTED VENTRAL HERNIA;  Surgeon: Olean Ree, MD;  Location: [facility];  Service: General;  Laterality: N/A;    FAMILY HISTORY: Family History  Problem Relation Age of Onset  . Cancer Mother        breast  . Stroke Mother   . Diabetes Father   . Hypertension Father   . Heart disease Maternal Grandfather   . Dementia Paternal Grandmother   . Bladder Cancer Neg Hx   . Kidney cancer Neg Hx     ADVANCED DIRECTIVES (Y/N):  N  HEALTH MAINTENANCE: Social History   Tobacco Use  . Smoking status: Never Smoker  . Smokeless tobacco: Never Used  Vaping Use  . Vaping Use: Never used  Substance Use Topics  . Alcohol use: Yes    Comment: 1 drink per month  . Drug use: No     Colonoscopy:  PAP:  Bone density:  Lipid panel:  Allergies  Allergen Reactions  . Oxycodone Rash  . Penicillins Rash    Has had a PCN reaction causing, facial/tongue/throat swelling, SOB or lightheadedness with hypotension: NO Has had a PCN reaction causing severe rash involving mucus membranes or skin necrosis: NO Has had a PCN reaction that required hospitalization: No Has had a PCN reaction occurring within the last 10 years: Yes If all of the above answers are "NO", then may proceed with  Cephalosporin use. Updated 01/06/20; Reaction was low severity rash. Ancef given. No reaction     Current Outpatient Medications  Medication Sig Dispense Refill  . Rivaroxaban (XARELTO) 15 MG TABS tablet Take 1 tablet (15 mg total) by mouth 2 (two) times daily with a meal. (Patient not taking: Reported on 06/08/2020) 42 tablet 2   No current facility-administered medications for this visit.    OBJECTIVE: There were no vitals filed for this visit.   There is no height or weight on file to calculate BMI.    ECOG FS:0 - Asymptomatic  General: Well-developed, well-nourished, no acute distress. HEENT: Normocephalic. Neuro: Alert, answering all questions appropriately. Cranial nerves grossly intact. Psych: Normal affect.   LAB RESULTS:  Lab Results  Component Value Date   NA 139 01/26/2020   K 4.1 01/26/2020   CL 104 01/26/2020   CO2 25 01/26/2020   GLUCOSE 109 (H) 01/26/2020   BUN 16 01/26/2020   CREATININE 0.62 01/26/2020   CALCIUM 9.3 01/26/2020   PROT 7.7 01/26/2020   ALBUMIN 3.7 01/26/2020   AST 18 01/26/2020   ALT 15 01/26/2020   ALKPHOS 66 01/26/2020   BILITOT 0.6 01/26/2020   GFRNONAA >60 01/26/2020   GFRAA >60 06/27/2019  Lab Results  Component Value Date   WBC 8.6 01/26/2020   NEUTROABS 5.9 01/27/2017   HGB 12.6 01/26/2020   HCT 38.3 01/26/2020   MCV 76.8 (L) 01/26/2020   PLT 400 01/26/2020     STUDIES: No results found.  ASSESSMENT: Heterozygous for prothrombin gene mutation  PLAN:    1. Heterozygous for prothrombin gene mutation: The remainder of her hypercoagulable work-up was negative.  Patient is at approximately 2-3 times risk of developing pulmonary embolism and DVT over the general population.  Patient has now had 3 separate episodes of DVT, her most recent in December 2021. Her initial blood clot occurred during pregnancy which is a known risk factor.  Patient now has agreed to take Eliquis lifelong, although admits to noncompliance over the  past several months.  She states she does not need additional refills and will obtain these from primary care.  No intervention is needed at this time.  No follow-up has been scheduled.  Please refer patient back if there are any questions or concerns.    I provided 20 minutes of face-to-face video visit time during this encounter which included chart review, counseling, and coordination of care as documented above.   Patient expressed understanding and was in agreement with this plan. She also understands that She can call clinic at any time with any questions, concerns, or complaints.    Lloyd Huger, MD   06/09/2020 6:37 AM

## 2020-06-08 ENCOUNTER — Encounter: Payer: Self-pay | Admitting: Oncology

## 2020-06-08 ENCOUNTER — Inpatient Hospital Stay: Payer: BC Managed Care – PPO | Attending: Oncology | Admitting: Oncology

## 2020-06-08 DIAGNOSIS — D6852 Prothrombin gene mutation: Secondary | ICD-10-CM

## 2020-06-08 NOTE — Progress Notes (Signed)
Patient denies concerns today. Patient states she has not been on the xarelto for a few months.

## 2020-07-05 ENCOUNTER — Telehealth: Payer: Self-pay

## 2020-07-05 NOTE — Telephone Encounter (Signed)
Called pt to schedule appt, no answer, left vm 

## 2020-07-05 NOTE — Telephone Encounter (Signed)
Received a fax from the call center stating that patient called in Saturday 07/03/20 complaining of blood in her stool. Please call and schedule patient an appointment ASAP.

## 2020-07-06 NOTE — Telephone Encounter (Signed)
Call pt no answer left vm

## 2020-08-09 ENCOUNTER — Other Ambulatory Visit: Payer: Self-pay

## 2020-08-09 ENCOUNTER — Encounter: Payer: Self-pay | Admitting: Obstetrics and Gynecology

## 2020-08-09 ENCOUNTER — Ambulatory Visit (INDEPENDENT_AMBULATORY_CARE_PROVIDER_SITE_OTHER): Payer: BC Managed Care – PPO | Admitting: Obstetrics and Gynecology

## 2020-08-09 VITALS — BP 138/74 | Ht 65.0 in | Wt 326.8 lb

## 2020-08-09 DIAGNOSIS — Z1331 Encounter for screening for depression: Secondary | ICD-10-CM | POA: Diagnosis not present

## 2020-08-09 DIAGNOSIS — Z01419 Encounter for gynecological examination (general) (routine) without abnormal findings: Secondary | ICD-10-CM

## 2020-08-09 DIAGNOSIS — Z1339 Encounter for screening examination for other mental health and behavioral disorders: Secondary | ICD-10-CM

## 2020-08-09 NOTE — Progress Notes (Signed)
Gynecology Annual Exam  PCP: Jon Billings, NP  Chief Complaint  Patient presents with   Gynecologic Exam   History of Present Illness:  Ms. Diana Sexton is a 35 y.o. [id number] who LMP was Patient's last menstrual period was 07/26/2020., presents today for her annual examination.  Her menses are absent currently.  She has occasional spotting. She had one period after her procedure in December. She has no pain with her periods. She has a random cramp.    She is sexually active.  She has no pain with intercourse.  She has had a BTL.  Last Pap: 08/2018  Results were: no abnormalities /neg HPV DNA negative Hx of STDs: none  There is no FH of breast cancer. There is no FH of ovarian cancer. The patient does do self-breast exams.  Tobacco use: The patient denies current or previous tobacco use. Alcohol use: social drinker Exercise: yes.  A couple of times a week, she walks or uses an exercise bike.   The patient wears seatbelts: yes.   The patient reports that domestic violence in her life is absent.   Past Medical History:  Diagnosis Date   Anemia    Anxiety    Cellulitis 2012   caused stillbirth   DVT (deep venous thrombosis) ([place]) 06/2011   under right clavicle   Dysmenorrhea    Family history of breast cancer 11/2013   BRCA/MyRisk neg   H/O blood clots    Headache    related to hormonal meds   Hernia, abdominal    History of kidney stones    History of prothrombin mutation    Increased risk of breast cancer 11/2013   IBIS=27%   Obesity     Past Surgical History:  Procedure Laterality Date   CHOLECYSTECTOMY  2007   CYSTOSCOPY W/ RETROGRADES Left 02/02/2017   Procedure: CYSTOSCOPY WITH RETROGRADE PYELOGRAM;  Surgeon: Abbie Sons, MD;  Location: [facility];  Service: Urology;  Laterality: Left;   CYSTOSCOPY W/ RETROGRADES Right 06/26/2019   Procedure: CYSTOSCOPY WITH RETROGRADE PYELOGRAM;  Surgeon: Abbie Sons, MD;  Location: [facility];  Service: Urology;   Laterality: Right;   CYSTOSCOPY/URETEROSCOPY/HOLMIUM LASER/STENT PLACEMENT Left 02/02/2017   Procedure: CYSTOSCOPY/URETEROSCOPY/HOLMIUM LASER/STENT PLACEMENT;  Surgeon: Abbie Sons, MD;  Location: [facility];  Service: Urology;  Laterality: Left;   CYSTOSCOPY/URETEROSCOPY/HOLMIUM LASER/STENT PLACEMENT Right 06/26/2019   Procedure: CYSTOSCOPY/URETEROSCOPY/HOLMIUM LASER/STENT PLACEMENT;  Surgeon: Abbie Sons, MD;  Location: [facility];  Service: Urology;  Laterality: Right;   DILITATION & CURRETTAGE/HYSTROSCOPY WITH ESSURE     DILITATION & CURRETTAGE/HYSTROSCOPY WITH NOVASURE ABLATION N/A 12/24/2018   Procedure: DILATATION & CURETTAGE/HYSTEROSCOPY WITH ENDOMETRIAL ABLATION (MINERVA SYSTEM);  Surgeon: Will Bonnet, MD;  Location: [facility];  Service: Gynecology;  Laterality: N/A;   HYSTEROSCOPY WITH D & C N/A 01/27/2020   Procedure: DILATATION AND CURETTAGE /HYSTEROSCOPY;  Surgeon: Will Bonnet, MD;  Location: [facility];  Service: Gynecology;  Laterality: N/A;   INTRAUTERINE DEVICE (IUD) INSERTION     LITHOTRIPSY     TONSILLECTOMY     TONSILLECTOMY AND ADENOIDECTOMY     TUBAL LIGATION  2014   XI ROBOTIC ASSISTED VENTRAL HERNIA N/A 01/06/2020   Procedure: XI ROBOTIC ASSISTED VENTRAL HERNIA;  Surgeon: Olean Ree, MD;  Location: [facility];  Service: General;  Laterality: N/A;    Prior to Admission medications: Denies    Allergies  Allergen Reactions   Oxycodone Rash   Penicillins Rash    Has had a PCN reaction  causing, facial/tongue/throat swelling, SOB or lightheadedness with hypotension: NO Has had a PCN reaction causing severe rash involving mucus membranes or skin necrosis: NO Has had a PCN reaction that required hospitalization: No Has had a PCN reaction occurring within the last 10 years: Yes If all of the above answers are "NO", then may proceed with Cephalosporin use. Updated 01/06/20; Reaction was low severity rash. Ancef given. No reaction     Obstetric  History: [id number]  Social History   Socioeconomic History   Marital status: Married    Spouse name: Programmer, multimedia   Number of children: 2   Years of education: Not on file   Highest education level: Not on file  Occupational History   Not on file  Tobacco Use   Smoking status: Never   Smokeless tobacco: Never  Vaping Use   Vaping Use: Never used  Substance and Sexual Activity   Alcohol use: Yes    Comment: 1 drink per month   Drug use: No   Sexual activity: Yes    Birth control/protection: Other-see comments, Surgical    Comment: tubal ligation  Other Topics Concern   Not on file  Social History Narrative   Lives with husband.   Social Determinants of Health   Financial Resource Strain: Not on file  Food Insecurity: Not on file  Transportation Needs: Not on file  Physical Activity: Not on file  Stress: Not on file  Social Connections: Not on file  Intimate Partner Violence: Not on file    Family History  Problem Relation Age of Onset   Cancer Mother        breast   Stroke Mother    Diabetes Father    Hypertension Father    Heart disease Maternal Grandfather    Dementia Paternal Grandmother    Bladder Cancer Neg Hx    Kidney cancer Neg Hx     Review of Systems  Constitutional: Negative.   HENT: Negative.    Eyes: Negative.   Respiratory: Negative.    Cardiovascular: Negative.   Gastrointestinal: Negative.   Genitourinary: Negative.   Musculoskeletal: Negative.   Skin: Negative.   Neurological: Negative.   Psychiatric/Behavioral: Negative.      Physical Exam BP 138/74   Ht 5' 5"  (1.651 m)   Wt (!) 326 lb 12.8 oz (148.2 kg)   LMP 07/26/2020   BMI 54.38 kg/m    Physical Exam Constitutional:      General: She is not in acute distress.    Appearance: Normal appearance. She is well-developed.  Genitourinary:     Vulva and bladder normal.     Right Labia: No rash, tenderness, lesions, skin changes or Bartholin's cyst.    Left Labia: No tenderness,  lesions, skin changes, Bartholin's cyst or rash.    No inguinal adenopathy present in the right or left side.    Pelvic Tanner Score: 5/5.    No vaginal discharge, erythema, tenderness or bleeding.      Right Adnexa: not tender, not full and no mass present.    Left Adnexa: not tender, not full and no mass present.    No cervical motion tenderness, discharge, lesion or polyp.     Uterus is not enlarged or tender.     No uterine mass detected.    Pelvic exam was performed with patient in the lithotomy position.  Breasts:    Right: No inverted nipple, mass, nipple discharge, skin change or tenderness.     Left: No inverted nipple,  mass, nipple discharge, skin change or tenderness.  HENT:     Head: Normocephalic and atraumatic.  Eyes:     General: No scleral icterus.    Conjunctiva/sclera: Conjunctivae normal.  Neck:     Thyroid: No thyromegaly.  Cardiovascular:     Rate and Rhythm: Normal rate and regular rhythm.     Heart sounds: No murmur heard.   No friction rub. No gallop.  Pulmonary:     Effort: Pulmonary effort is normal. No respiratory distress.     Breath sounds: Normal breath sounds. No wheezing or rales.  Abdominal:     General: Bowel sounds are normal. There is no distension.     Palpations: Abdomen is soft. There is no mass.     Tenderness: There is no abdominal tenderness. There is no guarding or rebound.     Hernia: There is no hernia in the left inguinal area or right inguinal area.  Musculoskeletal:        General: No swelling or tenderness. Normal range of motion.     Cervical back: Normal range of motion and neck supple.  Lymphadenopathy:     Cervical: No cervical adenopathy.     Lower Body: No right inguinal adenopathy. No left inguinal adenopathy.  Neurological:     General: No focal deficit present.     Mental Status: She is alert and oriented to person, place, and time.     Cranial Nerves: No cranial nerve deficit.  Skin:    General: Skin is warm and  dry.     Findings: No erythema or rash.  Psychiatric:        Mood and Affect: Mood normal.        Behavior: Behavior normal.        Judgment: Judgment normal.    Female chaperone present for pelvic and breast  portions of the physical exam  Results: AUDIT Questionnaire (screen for alcoholism): 1 PHQ-9: 7   Assessment: 35 y.o. S3P5945 female here for routine annual gynecologic examination  Plan: Problem List Items Addressed This Visit   None Visit Diagnoses     Women's annual routine gynecological examination    -  Primary   Screening for depression       Screening for alcoholism           Screening: -- Blood pressure screen  borderline.  Follow up with PCP -- Weight screening: obese: discussed management options, including lifestyle, dietary, and exercise. -- Depression screening negative (PHQ-9) -- Nutrition: normal -- cholesterol screening: not due for screening -- osteoporosis screening: not due -- tobacco screening: not using -- alcohol screening: AUDIT questionnaire indicates low-risk usage. -- family history of breast cancer screening: done. not at high risk. -- no evidence of domestic violence or intimate partner violence. -- STD screening: gonorrhea/chlamydia NAAT not collected per patient request. -- pap smear not collected per ASCCP guidelines  Prentice Docker, MD 08/09/2020 10:43 AM

## 2020-11-13 DEATH — deceased

## 2020-11-23 NOTE — Progress Notes (Signed)
BP 139/80   Pulse 76   Ht 5\' 5"  (1.651 m)   Wt (!) 326 lb (147.9 kg)   BMI 54.25 kg/m    Subjective:    Patient ID: Diana Sexton, female    DOB: [birth year], 35 y.o.   MRN: [id number]  HPI: Diana Sexton is a 35 y.o. female  Chief Complaint  Patient presents with   Nasal Congestion   UPPER RESPIRATORY TRACT INFECTION Worst symptom: congestion- started about 10 days ago Fever: no Cough:  productive cough somtimes Shortness of breath: no Wheezing: no Chest pain: no Chest tightness: no Chest congestion: no Nasal congestion: yes Runny nose: no Post nasal drip: yes Sneezing: no Sore throat: no Swollen glands: no Sinus pressure: no Headache: no Face pain: no Toothache: no Ear pain: no bilateral Ear pressure: no bilateral Eyes red/itching:no Eye drainage/crusting: no  Vomiting: no Rash: no Fatigue: no Sick contacts: no Strep contacts: no  Context: better Recurrent sinusitis: no Relief with OTC cold/cough medications: yes  Treatments attempted: mucinex    Relevant past medical, surgical, family and social history reviewed and updated as indicated. Interim medical history since our last visit reviewed. Allergies and medications reviewed and updated.  Review of Systems  Constitutional:  Negative for fatigue and fever.  HENT:  Positive for congestion and postnasal drip. Negative for dental problem, ear pain, rhinorrhea, sinus pressure, sinus pain, sneezing and sore throat.   Respiratory:  Positive for cough. Negative for shortness of breath and wheezing.   Cardiovascular:  Negative for chest pain.  Gastrointestinal:  Negative for vomiting.  Skin:  Negative for rash.  Neurological:  Negative for headaches.   Per HPI unless specifically indicated above     Objective:    BP 139/80   Pulse 76   Ht 5\' 5"  (1.651 m)   Wt (!) 326 lb (147.9 kg)   BMI 54.25 kg/m   Wt Readings from Last 3 Encounters:  11/24/20 (!) 326 lb (147.9 kg)  08/09/20 (!) 326 lb  12.8 oz (148.2 kg)  02/18/20 (!) 315 lb (142.9 kg)    Physical Exam Vitals and nursing note reviewed.  Constitutional:      General: She is not in acute distress.    Appearance: Normal appearance. She is normal weight. She is not ill-appearing, toxic-appearing or diaphoretic.  HENT:     Head: Normocephalic.     Right Ear: External ear normal. No tenderness. A middle ear effusion is present.     Left Ear: External ear normal. No tenderness. A middle ear effusion is present.     Nose: Congestion present.     Mouth/Throat:     Mouth: Mucous membranes are moist.     Pharynx: Oropharynx is clear. Posterior oropharyngeal erythema present. No oropharyngeal exudate.  Eyes:     General:        Right eye: No discharge.        Left eye: No discharge.     Extraocular Movements: Extraocular movements intact.     Conjunctiva/sclera: Conjunctivae normal.     Pupils: Pupils are equal, round, and reactive to light.  Cardiovascular:     Rate and Rhythm: Normal rate and regular rhythm.     Heart sounds: No murmur heard. Pulmonary:     Effort: Pulmonary effort is normal. No respiratory distress.     Breath sounds: Normal breath sounds. No wheezing or rales.  Musculoskeletal:     Cervical back: Normal range of motion and neck supple.  Skin:    General: Skin is warm and dry.     Capillary Refill: Capillary refill takes less than 2 seconds.  Neurological:     General: No focal deficit present.     Mental Status: She is alert and oriented to person, place, and time. Mental status is at baseline.  Psychiatric:        Mood and Affect: Mood normal.        Behavior: Behavior normal.        Thought Content: Thought content normal.        Judgment: Judgment normal.    Results for orders placed or performed during the hospital encounter of 01/27/20  Pregnancy, urine POC  Result Value Ref Range   Preg Test, Ur NEGATIVE NEGATIVE  Surgical pathology  Result Value Ref Range   SURGICAL PATHOLOGY       SURGICAL PATHOLOGY CASE: [id number] PATIENT: Beola Cord Surgical Pathology Report     Specimen Submitted: A. Endometrial curettings  Clinical History: Hematometra, Pelvic pain      DIAGNOSIS: A. ENDOMETRIUM, CURETTAGE: - SCANT FRAGMENTS OF DEGENERATED EPITHELIUM IN A BACKGROUND OF ABUNDANT BLOOD AND INFLAMMATORY DEBRIS. - SEE COMMENT.  Comment: There is no viable endometrial tissue present to evaluate for malignancy.  Clinical correlation is recommended.   GROSS DESCRIPTION: A. Labeled: Endometrial curettings Received: Formalin Tissue fragment(s): Multiple Size: Aggregate, 2.8 x 2.4 x 0.3 cm Description: Received on a Telfa pad and within a mesh bag are fragments of red-brown blood clot admixed with a scant amount of tan-pink soft tissue. Entirely submitted in 1 cassette.    Final Diagnosis performed by Betsy Pries, MD.   Electronically signed 01/28/2020 1:13:43PM The electronic signature indicates that the named Attending Pathologist  has evaluated the specimen Technical component performed at [facility], [address], [place], [place] [address] Lab: [phone] Dir: Rush Farmer, MD, MMM  Professional component performed at [facility], [facility], [place], [place], [place] [address] Lab: [phone] Dir: Dellia Nims. Reuel Derby, MD       Assessment & Plan:   Problem List Items Addressed This Visit   None Visit Diagnoses     Sinus congestion    -  Primary   Medrol dose pak sent to help with inflammation. Recommend OTC zyrtec at bedtime to help with congestion. Follow up in office if symptoms continue.        Follow up plan: Return if symptoms worsen or fail to improve.

## 2020-11-24 ENCOUNTER — Other Ambulatory Visit: Payer: Self-pay

## 2020-11-24 ENCOUNTER — Encounter: Payer: Self-pay | Admitting: Nurse Practitioner

## 2020-11-24 ENCOUNTER — Ambulatory Visit: Payer: BC Managed Care – PPO | Admitting: Nurse Practitioner

## 2020-11-24 VITALS — BP 139/80 | HR 76 | Ht 65.0 in | Wt 326.0 lb

## 2020-11-24 DIAGNOSIS — R0981 Nasal congestion: Secondary | ICD-10-CM

## 2020-11-24 MED ORDER — METHYLPREDNISOLONE 4 MG PO TBPK: ORAL_TABLET | ORAL | 0 refills | Status: DC

## 2020-12-02 ENCOUNTER — Encounter: Payer: Self-pay | Admitting: Nurse Practitioner

## 2020-12-02 ENCOUNTER — Other Ambulatory Visit: Payer: Self-pay

## 2020-12-02 ENCOUNTER — Ambulatory Visit: Payer: BC Managed Care – PPO | Admitting: Nurse Practitioner

## 2020-12-02 VITALS — BP 134/70 | HR 95 | Wt 328.0 lb

## 2020-12-02 DIAGNOSIS — R0981 Nasal congestion: Secondary | ICD-10-CM | POA: Diagnosis not present

## 2020-12-02 MED ORDER — METHYLPREDNISOLONE 4 MG PO TBPK: ORAL_TABLET | ORAL | 0 refills | Status: DC

## 2020-12-02 NOTE — Progress Notes (Signed)
BP 134/70   Pulse 95   Wt (!) 328 lb (148.8 kg)   SpO2 97%   BMI 54.58 kg/m    Subjective:    Patient ID: Diana Sexton, female    DOB: September 21, 1985, 35 y.o.   MRN: [id number]  HPI: Diana Sexton is a 35 y.o. female  Chief Complaint  Patient presents with   Ear Problem    Patient states she was seen previously for congestion and was prescribed medication. Patient states the congestion has gotten better but she is now having in pain in her L ear. Patient states it is like a constant pain and it feels as if it is near her jaw line.    Patient states she was seen previously for congestion and was prescribed medication. Patient states the congestion has gotten better but she is now having in pain in her L ear. Patient states it is like a constant pain and it feels as if it is near her jaw line.  Patient states it started last night.  She stopped taking the zyrtec.  Denies fever and nasal congestion.   Relevant past medical, surgical, family and social history reviewed and updated as indicated. Interim medical history since our last visit reviewed. Allergies and medications reviewed and updated.  Review of Systems  HENT:  Positive for congestion and ear pain.    Per HPI unless specifically indicated above     Objective:    BP 134/70   Pulse 95   Wt (!) 328 lb (148.8 kg)   SpO2 97%   BMI 54.58 kg/m   Wt Readings from Last 3 Encounters:  12/02/20 (!) 328 lb (148.8 kg)  11/24/20 (!) 326 lb (147.9 kg)  08/09/20 (!) 326 lb 12.8 oz (148.2 kg)    Physical Exam Vitals and nursing note reviewed.  Constitutional:      General: She is not in acute distress.    Appearance: Normal appearance. She is normal weight. She is not ill-appearing, toxic-appearing or diaphoretic.  HENT:     Head: Normocephalic.     Right Ear: External ear normal. A middle ear effusion is present.     Left Ear: External ear normal. A middle ear effusion is present.     Nose: Nose normal.      Mouth/Throat:     Mouth: Mucous membranes are moist.     Pharynx: Oropharynx is clear.  Eyes:     General:        Right eye: No discharge.        Left eye: No discharge.     Extraocular Movements: Extraocular movements intact.     Conjunctiva/sclera: Conjunctivae normal.     Pupils: Pupils are equal, round, and reactive to light.  Cardiovascular:     Rate and Rhythm: Normal rate and regular rhythm.     Heart sounds: No murmur heard. Pulmonary:     Effort: Pulmonary effort is normal. No respiratory distress.     Breath sounds: Normal breath sounds. No wheezing or rales.  Musculoskeletal:     Cervical back: Normal range of motion and neck supple.  Skin:    General: Skin is warm and dry.     Capillary Refill: Capillary refill takes less than 2 seconds.  Neurological:     General: No focal deficit present.     Mental Status: She is alert and oriented to person, place, and time. Mental status is at baseline.  Psychiatric:  Mood and Affect: Mood normal.        Behavior: Behavior normal.        Thought Content: Thought content normal.        Judgment: Judgment normal.    Results for orders placed or performed during the hospital encounter of 01/27/20  Pregnancy, urine POC  Result Value Ref Range   Preg Test, Ur NEGATIVE NEGATIVE  Surgical pathology  Result Value Ref Range   SURGICAL PATHOLOGY      SURGICAL PATHOLOGY CASE: [id number] PATIENT: Diana Sexton Surgical Pathology Report     Specimen Submitted: A. Endometrial curettings  Clinical History: Hematometra, Pelvic pain      DIAGNOSIS: A. ENDOMETRIUM, CURETTAGE: - SCANT FRAGMENTS OF DEGENERATED EPITHELIUM IN A BACKGROUND OF ABUNDANT BLOOD AND INFLAMMATORY DEBRIS. - SEE COMMENT.  Comment: There is no viable endometrial tissue present to evaluate for malignancy.  Clinical correlation is recommended.   GROSS DESCRIPTION: A. Labeled: Endometrial curettings Received: Formalin Tissue fragment(s):  Multiple Size: Aggregate, 2.8 x 2.4 x 0.3 cm Description: Received on a Telfa pad and within a mesh bag are fragments of red-brown blood clot admixed with a scant amount of tan-pink soft tissue. Entirely submitted in 1 cassette.    Final Diagnosis performed by Betsy Pries, MD.   Electronically signed 01/28/2020 1:13:43PM The electronic signature indicates that the named Attending Pathologist  has evaluated the specimen Technical component performed at [facility], [address] [place], [place] [address] Lab: [phone] Dir: Rush Farmer, MD, MMM  Professional component performed at [facility], [facility], [place], [place], [place] [address] Lab: [phone] Dir: Dellia Nims. Reuel Derby, MD       Assessment & Plan:   Problem List Items Addressed This Visit   None Visit Diagnoses     Sinus congestion    -  Primary   Will repeat steroids. Discussed that she should continue Zyrtec daily at this time to prevent symptoms.  FU if symptoms worsen or fail to improve.         Follow up plan: Return if symptoms worsen or fail to improve.

## 2021-03-06 DIAGNOSIS — J020 Streptococcal pharyngitis: Secondary | ICD-10-CM | POA: Diagnosis not present

## 2021-03-06 DIAGNOSIS — R070 Pain in throat: Secondary | ICD-10-CM | POA: Diagnosis not present

## 2021-03-07 ENCOUNTER — Ambulatory Visit: Payer: BC Managed Care – PPO | Admitting: Nurse Practitioner

## 2021-03-23 ENCOUNTER — Other Ambulatory Visit: Payer: Self-pay

## 2021-03-23 ENCOUNTER — Ambulatory Visit: Payer: BC Managed Care – PPO | Admitting: Nurse Practitioner

## 2021-03-23 ENCOUNTER — Encounter: Payer: Self-pay | Admitting: Nurse Practitioner

## 2021-03-23 VITALS — BP 125/63 | HR 86 | Temp 98.5°F | Wt 336.4 lb

## 2021-03-23 DIAGNOSIS — M7662 Achilles tendinitis, left leg: Secondary | ICD-10-CM

## 2021-03-23 MED ORDER — PREDNISONE 10 MG PO TABS: 10.0000 mg | ORAL_TABLET | Freq: Every day | ORAL | 0 refills | Status: DC

## 2021-03-23 MED ORDER — IBUPROFEN 800 MG PO TABS: 800.0000 mg | ORAL_TABLET | Freq: Three times a day (TID) | ORAL | 0 refills | Status: DC | PRN

## 2021-03-23 NOTE — Progress Notes (Signed)
BP 125/63    Pulse 86    Temp 98.5 F (36.9 C) (Oral)    Wt (!) 336 lb 6.4 oz (152.6 kg)    SpO2 97%    BMI 55.98 kg/m    Subjective:    Patient ID: Diana Sexton, female    DOB: 1985/12/05, 36 y.o.   MRN: [id number]  HPI: Diana Sexton is a 36 y.o. female  Chief Complaint  Patient presents with   Ankle Pain   FOOT PAIN Duration: months Involved foot: left Mechanism of injury: unknown Location: near the achillis Onset: gradual  Severity: 7/10  Quality:  sharp and aching Frequency: constant Radiation: no Aggravating factors: walking  Alleviating factors:  tylenol and rest  Status: worse Treatments attempted:  tylenol and rest  Relief with NSAIDs?:  mild Weakness with weight bearing or walking: no Morning stiffness: no Swelling: no Redness: no Bruising: no Paresthesias / decreased sensation: no  Fevers:no  Relevant past medical, surgical, family and social history reviewed and updated as indicated. Interim medical history since our last visit reviewed. Allergies and medications reviewed and updated.  Review of Systems  Musculoskeletal:        Ankle pain   Per HPI unless specifically indicated above     Objective:    BP 125/63    Pulse 86    Temp 98.5 F (36.9 C) (Oral)    Wt (!) 336 lb 6.4 oz (152.6 kg)    SpO2 97%    BMI 55.98 kg/m   Wt Readings from Last 3 Encounters:  03/23/21 (!) 336 lb 6.4 oz (152.6 kg)  12/02/20 (!) 328 lb (148.8 kg)  11/24/20 (!) 326 lb (147.9 kg)    Physical Exam Vitals and nursing note reviewed.  Constitutional:      General: She is not in acute distress.    Appearance: Normal appearance. She is normal weight. She is not ill-appearing, toxic-appearing or diaphoretic.  HENT:     Head: Normocephalic.     Right Ear: External ear normal.     Left Ear: External ear normal.     Nose: Nose normal.     Mouth/Throat:     Mouth: Mucous membranes are moist.     Pharynx: Oropharynx is clear.  Eyes:     General:        Right  eye: No discharge.        Left eye: No discharge.     Extraocular Movements: Extraocular movements intact.     Conjunctiva/sclera: Conjunctivae normal.     Pupils: Pupils are equal, round, and reactive to light.  Cardiovascular:     Rate and Rhythm: Normal rate and regular rhythm.     Heart sounds: No murmur heard. Pulmonary:     Effort: Pulmonary effort is normal. No respiratory distress.     Breath sounds: Normal breath sounds. No wheezing or rales.  Musculoskeletal:     Cervical back: Normal range of motion and neck supple.     Right ankle:     Right Achilles Tendon: No tenderness or defects.     Left ankle: No swelling. Tenderness present. Normal range of motion.     Left Achilles Tendon: Tenderness present. No defects.  Skin:    General: Skin is warm and dry.     Capillary Refill: Capillary refill takes less than 2 seconds.  Neurological:     General: No focal deficit present.     Mental Status: She is alert and oriented to  person, place, and time. Mental status is at baseline.  Psychiatric:        Mood and Affect: Mood normal.        Behavior: Behavior normal.        Thought Content: Thought content normal.        Judgment: Judgment normal.    Results for orders placed or performed during the hospital encounter of 01/27/20  Pregnancy, urine POC  Result Value Ref Range   Preg Test, Ur NEGATIVE NEGATIVE  Surgical pathology  Result Value Ref Range   SURGICAL PATHOLOGY      SURGICAL PATHOLOGY CASE: [id number] PATIENT: Diana Sexton Surgical Pathology Report     Specimen Submitted: A. Endometrial curettings  Clinical History: Hematometra, Pelvic pain      DIAGNOSIS: A. ENDOMETRIUM, CURETTAGE: - SCANT FRAGMENTS OF DEGENERATED EPITHELIUM IN A BACKGROUND OF ABUNDANT BLOOD AND INFLAMMATORY DEBRIS. - SEE COMMENT.  Comment: There is no viable endometrial tissue present to evaluate for malignancy.  Clinical correlation is recommended.   GROSS  DESCRIPTION: A. Labeled: Endometrial curettings Received: Formalin Tissue fragment(s): Multiple Size: Aggregate, 2.8 x 2.4 x 0.3 cm Description: Received on a Telfa pad and within a mesh bag are fragments of red-brown blood clot admixed with a scant amount of tan-pink soft tissue. Entirely submitted in 1 cassette.    Final Diagnosis performed by Betsy Pries, MD.   Electronically signed 01/28/2020 1:13:43PM The electronic signature indicates that the named Attending Pathologist  has evaluated the specimen Technical component performed at [facility], [address] [place], [place] [address] Lab: [phone] Dir: Rush Farmer, MD, MMM  Professional component performed at [facility], [facility], [place], [place], [place] [address] Lab: [phone] Dir: Dellia Nims. Reuel Derby, MD       Assessment & Plan:   Problem List Items Addressed This Visit   None Visit Diagnoses     Achilles tendinitis of left lower extremity    -  Primary   Prednisone taper given in office. Recommend ibuprofen TID x 3 days. Recommend resting. If not improved, will send to PT.  Follow up if symptoms not improved.        Follow up plan: Return if symptoms worsen or fail to improve.

## 2021-04-28 ENCOUNTER — Encounter: Payer: Self-pay | Admitting: Nurse Practitioner

## 2021-04-29 ENCOUNTER — Encounter: Payer: Self-pay | Admitting: Nurse Practitioner

## 2021-04-29 ENCOUNTER — Ambulatory Visit: Payer: BC Managed Care – PPO | Admitting: Nurse Practitioner

## 2021-04-29 ENCOUNTER — Other Ambulatory Visit: Payer: Self-pay

## 2021-04-29 VITALS — BP 131/63 | HR 85 | Temp 98.5°F | Wt 340.8 lb

## 2021-04-29 DIAGNOSIS — R300 Dysuria: Secondary | ICD-10-CM

## 2021-04-29 DIAGNOSIS — N3000 Acute cystitis without hematuria: Secondary | ICD-10-CM

## 2021-04-29 MED ORDER — NITROFURANTOIN MONOHYD MACRO 100 MG PO CAPS: 100.0000 mg | ORAL_CAPSULE | Freq: Two times a day (BID) | ORAL | 0 refills | Status: DC

## 2021-04-29 NOTE — Progress Notes (Signed)
? ?BP 131/63   Pulse 85   Temp 98.5 ?F (36.9 ?C) (Oral)   Wt (!) 340 lb 12.8 oz (154.6 kg)   SpO2 98%   BMI 56.71 kg/m?   ? ?Subjective:  ? ? Patient ID: Diana Sexton, female    DOB: 10-02-1985, 36 y.o.   MRN: [id number] ? ?HPI: ?Diana Sexton is a 36 y.o. female ? ?Chief Complaint  ?Patient presents with  ? Urinary Tract Infection  ?  Pt states she has been having urinary frequency since Wednesday   ? ?URINARY SYMPTOMS ?Dysuria: no ?Urinary frequency: yes ?Urgency: yes ?Small volume voids: yes ?Symptom severity: no ?Urinary incontinence: no ?Foul odor:  not foul but not her normal odor ?Hematuria: no ?Abdominal pain: no ?Back pain: yes ?Suprapubic pain/pressure: no ?Flank pain: no ?Fever:  no ?Vomiting: no ?Relief with cranberry juice: no ?Relief with pyridium: no ?Status: stable ?Previous urinary tract infection: no ?Recurrent urinary tract infection: no ?History of sexually transmitted disease: no ?Penile discharge: no ?Treatments attempted: none  ? ?Relevant past medical, surgical, family and social history reviewed and updated as indicated. Interim medical history since our last visit reviewed. ?Allergies and medications reviewed and updated. ? ?Review of Systems  ?Constitutional:  Negative for fever.  ?Gastrointestinal:  Positive for abdominal pain. Negative for vomiting.  ?Genitourinary:  Positive for decreased urine volume, dysuria and frequency. Negative for flank pain, hematuria and urgency.  ?Musculoskeletal:  Negative for back pain.  ? ?Per HPI unless specifically indicated above ? ?   ?Objective:  ?  ?BP 131/63   Pulse 85   Temp 98.5 ?F (36.9 ?C) (Oral)   Wt (!) 340 lb 12.8 oz (154.6 kg)   SpO2 98%   BMI 56.71 kg/m?   ?Wt Readings from Last 3 Encounters:  ?04/29/21 (!) 340 lb 12.8 oz (154.6 kg)  ?03/23/21 (!) 336 lb 6.4 oz (152.6 kg)  ?12/02/20 (!) 328 lb (148.8 kg)  ?  ?Physical Exam ?Vitals and nursing note reviewed.  ?Constitutional:   ?   General: She is not in acute distress. ?    Appearance: Normal appearance. She is normal weight. She is not ill-appearing, toxic-appearing or diaphoretic.  ?HENT:  ?   Head: Normocephalic.  ?   Right Ear: External ear normal.  ?   Left Ear: External ear normal.  ?   Nose: Nose normal.  ?   Mouth/Throat:  ?   Mouth: Mucous membranes are moist.  ?   Pharynx: Oropharynx is clear.  ?Eyes:  ?   General:     ?   Right eye: No discharge.     ?   Left eye: No discharge.  ?   Extraocular Movements: Extraocular movements intact.  ?   Conjunctiva/sclera: Conjunctivae normal.  ?   Pupils: Pupils are equal, round, and reactive to light.  ?Cardiovascular:  ?   Rate and Rhythm: Normal rate and regular rhythm.  ?   Heart sounds: No murmur heard. ?Pulmonary:  ?   Effort: Pulmonary effort is normal. No respiratory distress.  ?   Breath sounds: Normal breath sounds. No wheezing or rales.  ?Abdominal:  ?   General: Abdomen is flat. Bowel sounds are normal. There is no distension.  ?   Palpations: Abdomen is soft.  ?   Tenderness: There is no abdominal tenderness. There is no right CVA tenderness, left CVA tenderness or guarding.  ?Musculoskeletal:  ?   Cervical back: Normal range of motion and neck supple.  ?  Skin: ?   General: Skin is warm and dry.  ?   Capillary Refill: Capillary refill takes less than 2 seconds.  ?Neurological:  ?   General: No focal deficit present.  ?   Mental Status: She is alert and oriented to person, place, and time. Mental status is at baseline.  ?Psychiatric:     ?   Mood and Affect: Mood normal.     ?   Behavior: Behavior normal.     ?   Thought Content: Thought content normal.     ?   Judgment: Judgment normal.  ? ? ?Results for orders placed or performed during the hospital encounter of 01/27/20  ?Pregnancy, urine POC  ?Result Value Ref Range  ? Preg Test, Ur NEGATIVE NEGATIVE  ?Surgical pathology  ?Result Value Ref Range  ? SURGICAL PATHOLOGY    ?  SURGICAL PATHOLOGY ?CASE: [id number] ?PATIENT: Jenny Abdallah ?Surgical Pathology  Report ? ? ? ? ?Specimen Submitted: ?A. Endometrial curettings ? ?Clinical History: Hematometra, Pelvic pain ? ? ? ? ? ?DIAGNOSIS: ?A. ENDOMETRIUM, CURETTAGE: ?- SCANT FRAGMENTS OF DEGENERATED EPITHELIUM IN A BACKGROUND OF ABUNDANT ?BLOOD AND INFLAMMATORY DEBRIS. ?- SEE COMMENT. ? ?Comment: ?There is no viable endometrial tissue present to evaluate for ?malignancy.  Clinical correlation is recommended. ? ? ?GROSS DESCRIPTION: ?A. Labeled: Endometrial curettings ?Received: Formalin ?Tissue fragment(s): Multiple ?Size: Aggregate, 2.8 x 2.4 x 0.3 cm ?Description: Received on a Telfa pad and within a mesh bag are fragments ?of red-brown blood clot admixed with a scant amount of tan-pink soft ?tissue. ?Entirely submitted in 1 cassette. ? ? ? ?Final Diagnosis performed by Betsy Pries, MD.   Electronically signed ?01/28/2020 1:13:43PM ?The electronic signature indicates that the named Attending Pathologist  ?has evaluated the specimen ?Technical component performed at [facility], [address] [place], ?[place] [address] Lab: [phone] Dir: Rush Farmer, MD, MMM ? Professional component performed at [facility], [facility], [place], [place], [place] [address] Lab: [phone] ?Dir: Dellia Nims. Reuel Derby, MD ?  ? ?   ?Assessment & Plan:  ? ?Problem List Items Addressed This Visit   ?None ?Visit Diagnoses   ? ? Acute cystitis without hematuria    -  Primary  ? Unable to run UA in office today. Will treat based on patient's symptoms. Will send urine for culture.  Follow up if symptoms worsen or fail to improve.  ? Dysuria      ? Relevant Orders  ? Urinalysis, Routine w reflex microscopic  ? Urine Culture  ? ?  ?  ? ?Follow up plan: ?Return if symptoms worsen or fail to improve. ? ? ? ? ? ?

## 2021-04-29 NOTE — Patient Instructions (Signed)
Wegovy [place] 

## 2021-04-30 LAB — MICROSCOPIC EXAMINATION
Bacteria, UA: NONE SEEN
Casts: NONE SEEN /lpf
RBC, Urine: >30 /hpf — AB (ref 0–2)

## 2021-04-30 LAB — URINALYSIS, ROUTINE W REFLEX MICROSCOPIC
Bilirubin, UA: NEGATIVE
Glucose, UA: NEGATIVE
Ketones, UA: NEGATIVE
Leukocytes,UA: NEGATIVE
Nitrite, UA: NEGATIVE
Specific Gravity, UA: 1.024 (ref 1.005–1.030)
Urobilinogen, Ur: 0.2 mg/dL (ref 0.2–1.0)
pH, UA: 5 (ref 5.0–7.5)

## 2021-05-01 LAB — URINE CULTURE

## 2021-05-02 NOTE — Progress Notes (Signed)
Hi Diana Sexton.  There was no bacteria in your urine so you did not have a UTI.  If your symptoms continue please make another appointment so can do further evaluation.

## 2021-05-15 IMAGING — CT CT RENAL STONE PROTOCOL
2 of 4 series · 16 of 46 positions shown, 18 images · non-contrast
Comparison: 06/23/2019.

CLINICAL DATA: Flank pain, evaluate for kidney stone

EXAM:
CT ABDOMEN AND PELVIS WITHOUT CONTRAST
TECHNIQUE: Multidetector CT imaging of the abdomen and pelvis was performed
following the standard protocol without IV contrast.

[Series 2: stone full standard · axial · 0.88mm/px · z∈[-900,-450]mm · 13 of 100 slices shown, 15 images]
[im 5/100  soft-tissue]
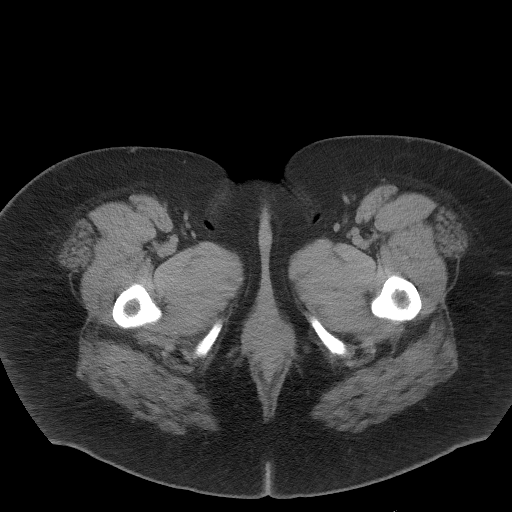
[im 5/100  bone]
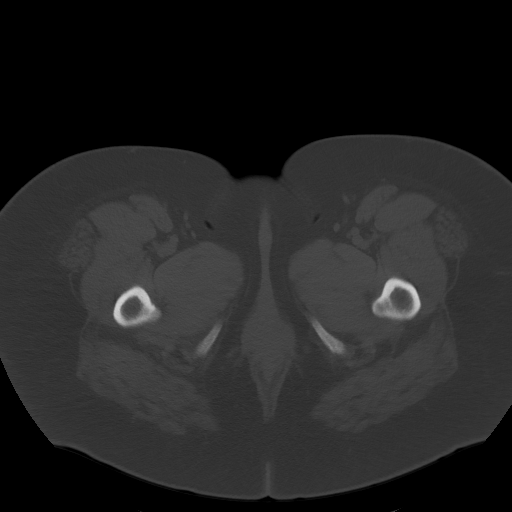
[im 13/100  soft-tissue]
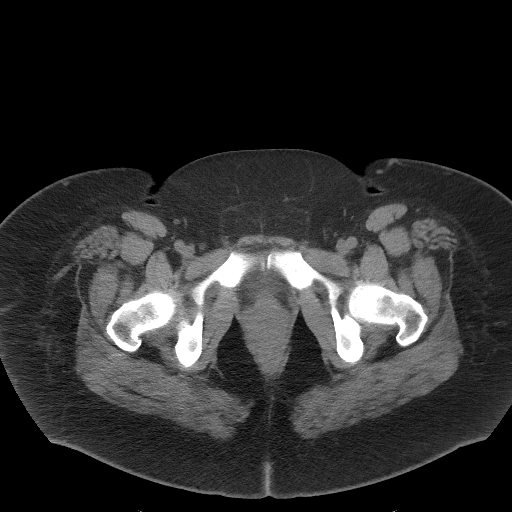
[im 21/100  soft-tissue]
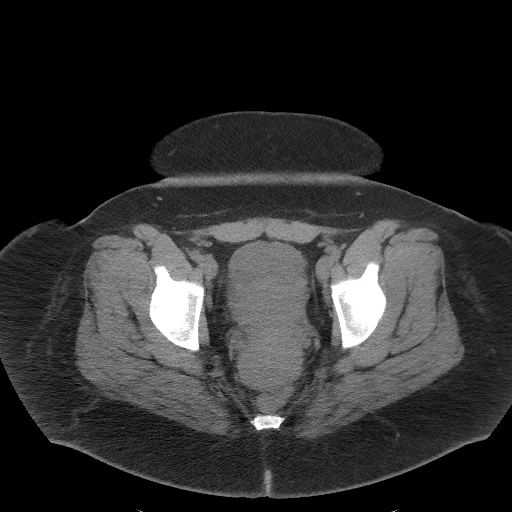
[im 29/100  soft-tissue]
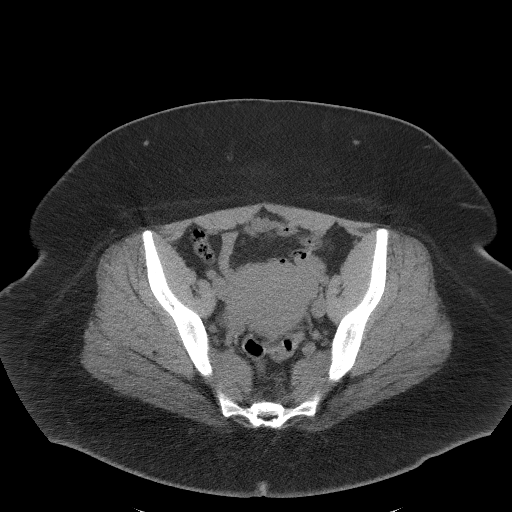
[im 34/100  soft-tissue]
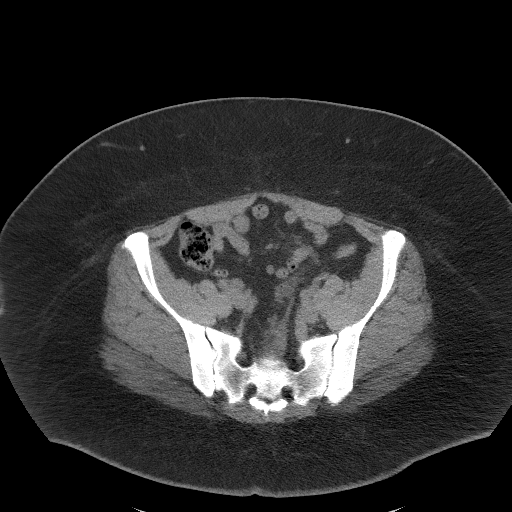
[im 42/100  soft-tissue]
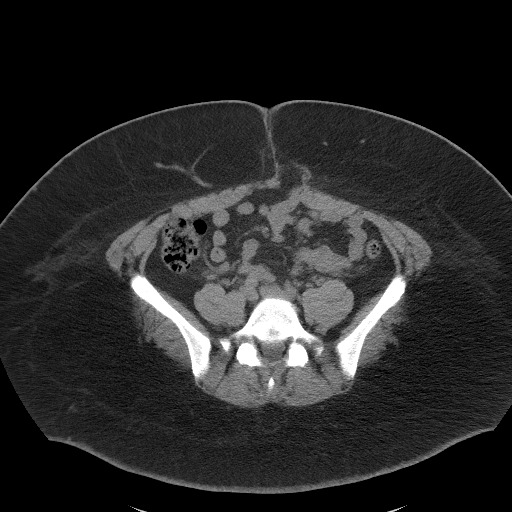
[im 50/100  soft-tissue]
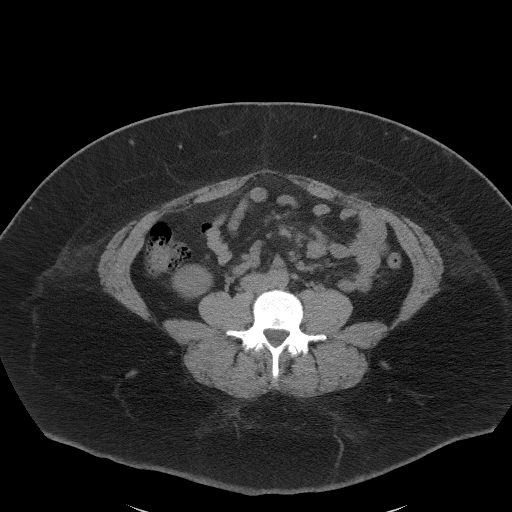
[im 58/100  soft-tissue]
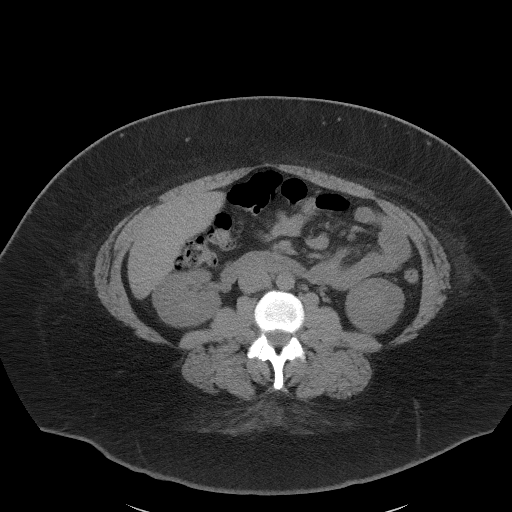
[im 67/100  soft-tissue]
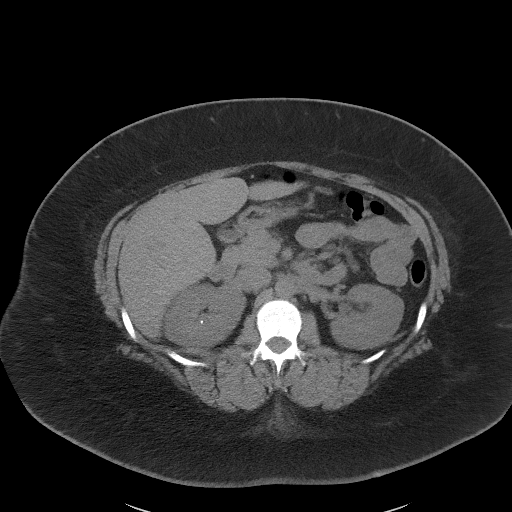
[im 67/100  bone]
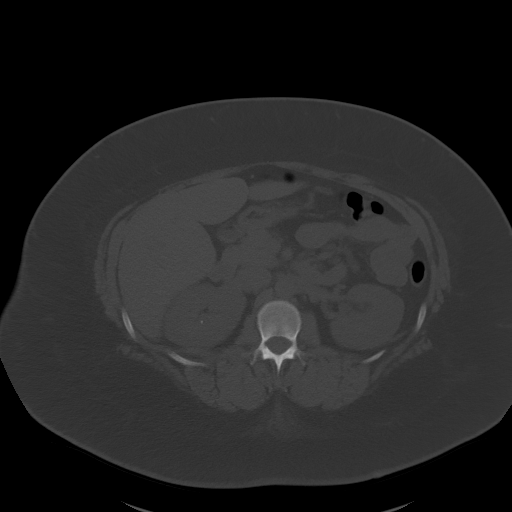
[im 71/100  soft-tissue]
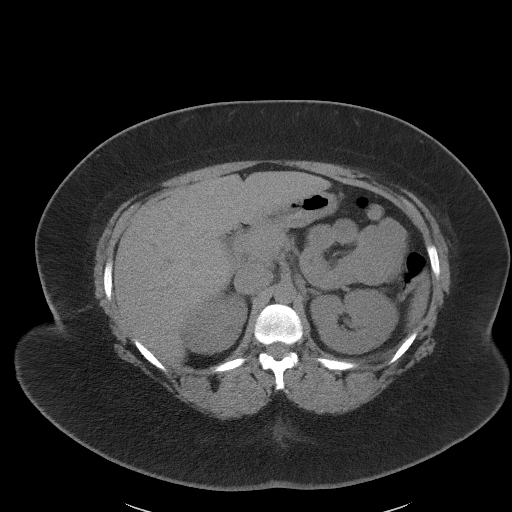
[im 79/100  soft-tissue]
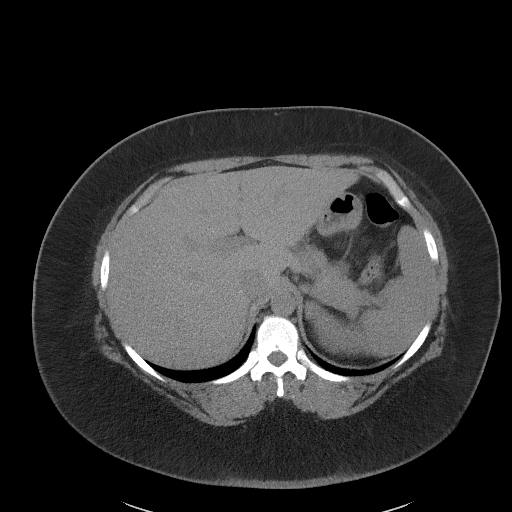
[im 87/100  soft-tissue]
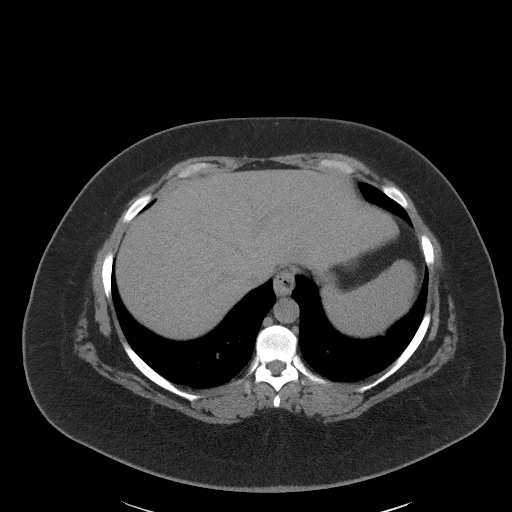
[im 95/100  soft-tissue]
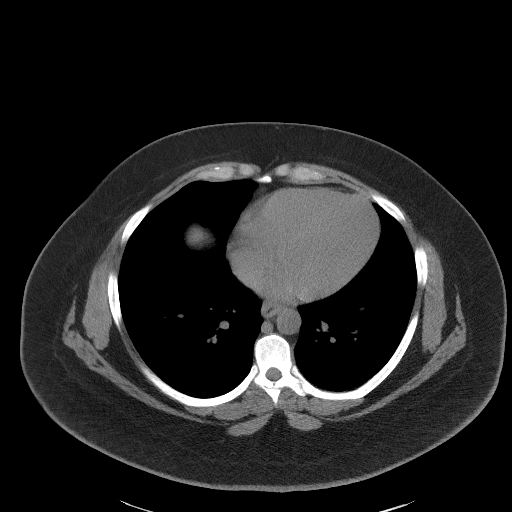

[Series 5: coronal · coronal · 0.81mm/px · 3 of 177 slices shown]
[im 59/177  soft-tissue]
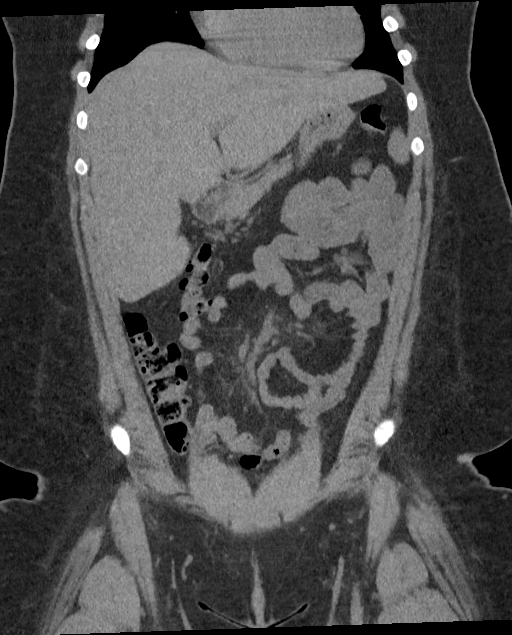
[im 79/177  soft-tissue]
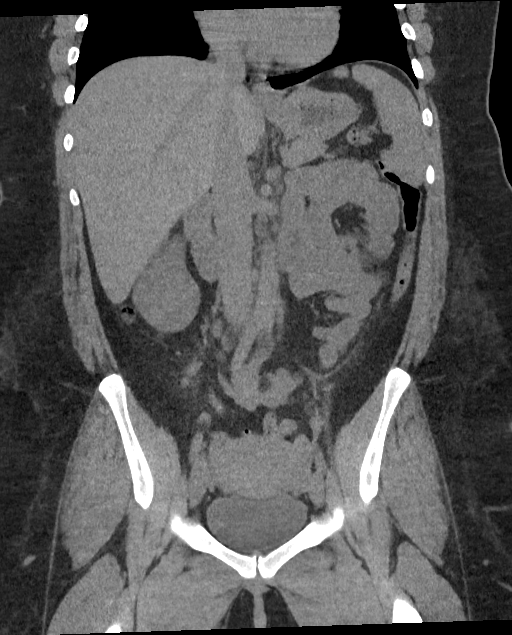
[im 98/177  soft-tissue]
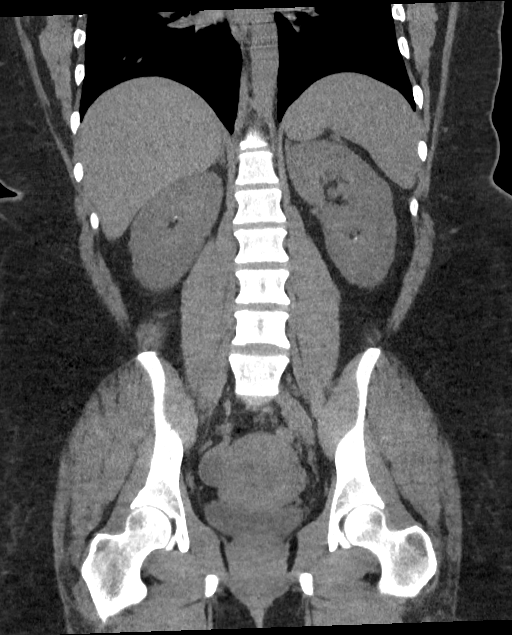

[16 of 46 positions shown; findings below may reference images not displayed]

FINDINGS: Lower chest: No acute abnormality.

Hepatobiliary: No focal liver abnormality. Previous cholecystectomy.
No biliary ductal dilatation.

Pancreas: Unremarkable. No pancreatic ductal dilatation or
surrounding inflammatory changes.

Spleen: Normal in size without focal abnormality.

Adrenals/Urinary Tract: Normal appearance of the adrenal glands.
Bilateral nephrolithiasis. The largest right kidney stone is in the
mid to lower pole collecting system measuring 4 mm, image 85/5. The
largest left kidney stone is in the inferior pole measuring 6 mm,
image 91/5. No hydronephrosis identified bilaterally. No hydroureter
or ureteral lithiasis identified. No bladder calcifications
identified.

Stomach/Bowel: Stomach appears normal. The appendix is visualized
and appears within normal limits. No bowel wall thickening,
inflammation or dilatation.

Vascular/Lymphatic: No significant vascular findings are present. No
enlarged abdominal or pelvic lymph nodes.

Reproductive: Uterus and bilateral adnexa are unremarkable.

Other: At the level of the umbilicus there is a left paramidline
ventral abdominal wall hernia contains fat and a knuckle of
nondilated small bowel, image 59/2. there is no free fluid or fluid
collections identified within the abdomen or pelvis.

Musculoskeletal: No acute or significant osseous findings.
IMPRESSION: 1. No acute findings within the abdomen or pelvis.
2. Bilateral nephrolithiasis. No hydronephrosis or hydroureter. No
ureteral lithiasis identified.
3. Left paramidline ventral abdominal wall hernia containing fat and
a knuckle of nondilated small bowel.

## 2021-05-16 ENCOUNTER — Encounter: Payer: Self-pay | Admitting: Nurse Practitioner

## 2021-05-17 NOTE — Progress Notes (Signed)
? ?BP 122/72   Pulse 73   Temp 98.6 ?F (37 ?C) (Oral)   Wt (!) 341 lb 9.6 oz (154.9 kg)   SpO2 98%   BMI 56.85 kg/m?   ? ?Subjective:  ? ? Patient ID: Diana Sexton, female    DOB: 1985/07/03, 36 y.o.   MRN: [id number] ? ?HPI: ?Diana Sexton is a 36 y.o. female ? ?Chief Complaint  ?Patient presents with  ? Weight Loss  ?  Pt states she would like to discuss weight loss medication. States she has never tried any medications but has tried diets and lowering calorie/sugar intake. States her insurance would cover [place] or [place] with a PA  ? ?WEIGHT GAIN ?Duration: years ?Previous attempts at weight loss: yes ?Complications of obesity: IFG ?Peak weight: 341lb ?Weight loss goal:  ?Weight loss to date:  ?Requesting obesity pharmacotherapy: yes ?Current weight loss supplements/medications: no ?Previous weight loss supplements/meds: no ?Calories:  ? ?Relevant past medical, surgical, family and social history reviewed and updated as indicated. Interim medical history since our last visit reviewed. ?Allergies and medications reviewed and updated. ? ?Review of Systems  ?Constitutional:  Positive for unexpected weight change.  ? ?Per HPI unless specifically indicated above ? ?   ?Objective:  ?  ?BP 122/72   Pulse 73   Temp 98.6 ?F (37 ?C) (Oral)   Wt (!) 341 lb 9.6 oz (154.9 kg)   SpO2 98%   BMI 56.85 kg/m?   ?Wt Readings from Last 3 Encounters:  ?05/18/21 (!) 341 lb 9.6 oz (154.9 kg)  ?04/29/21 (!) 340 lb 12.8 oz (154.6 kg)  ?03/23/21 (!) 336 lb 6.4 oz (152.6 kg)  ?  ?Physical Exam ?Vitals and nursing note reviewed.  ?Constitutional:   ?   General: She is not in acute distress. ?   Appearance: Normal appearance. She is obese. She is not ill-appearing, toxic-appearing or diaphoretic.  ?HENT:  ?   Head: Normocephalic.  ?   Right Ear: External ear normal.  ?   Left Ear: External ear normal.  ?   Nose: Nose normal.  ?   Mouth/Throat:  ?   Mouth: Mucous membranes are moist.  ?   Pharynx: Oropharynx is clear.   ?Eyes:  ?   General:     ?   Right eye: No discharge.     ?   Left eye: No discharge.  ?   Extraocular Movements: Extraocular movements intact.  ?   Conjunctiva/sclera: Conjunctivae normal.  ?   Pupils: Pupils are equal, round, and reactive to light.  ?Cardiovascular:  ?   Rate and Rhythm: Normal rate and regular rhythm.  ?   Heart sounds: No murmur heard. ?Pulmonary:  ?   Effort: Pulmonary effort is normal. No respiratory distress.  ?   Breath sounds: Normal breath sounds. No wheezing or rales.  ?Musculoskeletal:  ?   Cervical back: Normal range of motion and neck supple.  ?Skin: ?   General: Skin is warm and dry.  ?   Capillary Refill: Capillary refill takes less than 2 seconds.  ?Neurological:  ?   General: No focal deficit present.  ?   Mental Status: She is alert and oriented to person, place, and time. Mental status is at baseline.  ?Psychiatric:     ?   Mood and Affect: Mood normal.     ?   Behavior: Behavior normal.     ?   Thought Content: Thought content normal.     ?  Judgment: Judgment normal.  ? ? ?Results for orders placed or performed in visit on 04/29/21  ?Urine Culture  ? Specimen: Urine  ? UR  ?Result Value Ref Range  ? Urine Culture, Routine Final report   ? Organism ID, Bacteria Comment   ?Microscopic Examination  ?Result Value Ref Range  ? WBC, UA 0-5 0 - 5 /hpf  ? RBC >30 (A) 0 - 2 /hpf  ? Epithelial Cells (non renal) 0-10 0 - 10 /hpf  ? Casts None seen None seen /lpf  ? Bacteria, UA None seen None seen/Few  ?Urinalysis, Routine w reflex microscopic  ?Result Value Ref Range  ? Specific Gravity, UA 1.024 1.005 - 1.030  ? pH, UA 5.0 5.0 - 7.5  ? Color, UA Yellow Yellow  ? Appearance Ur Clear Clear  ? Leukocytes,UA Negative Negative  ? Protein,UA 1+ (A) Negative/Trace  ? Glucose, UA Negative Negative  ? Ketones, UA Negative Negative  ? RBC, UA 2+ (A) Negative  ? Bilirubin, UA Negative Negative  ? Urobilinogen, Ur 0.2 0.2 - 1.0 mg/dL  ? Nitrite, UA Negative Negative  ? Microscopic Examination See  below:   ? ?   ?Assessment & Plan:  ? ?Problem List Items Addressed This Visit   ? ?  ? Other  ? Morbid obesity ([place]) - Primary  ?  Chronic. Would like to start Weight loss Supplement.  Will give [facility] for weight loss help. Discussed side effects and benefits of medication during visit today.  Discussed how to titrate medication.  Follow up in 1 month for reevaluation.  ?  ?  ? Relevant Medications  ? Semaglutide-Weight Management 0.25 MG/0.5ML SOAJ  ?  ? ?Follow up plan: ?Return in about 6 weeks (around 06/29/2021) for Weight Managment. ? ? ? ? ? ?

## 2021-05-18 ENCOUNTER — Ambulatory Visit: Payer: BC Managed Care – PPO | Admitting: Nurse Practitioner

## 2021-05-18 ENCOUNTER — Encounter: Payer: Self-pay | Admitting: Nurse Practitioner

## 2021-05-18 MED ORDER — SEMAGLUTIDE-WEIGHT MANAGEMENT 0.25 MG/0.5ML ~~LOC~~ SOAJ: 0.2500 mg | SUBCUTANEOUS | 0 refills | Status: DC

## 2021-05-18 NOTE — Assessment & Plan Note (Signed)
Chronic. Would like to start Weight loss Supplement.  Will give [facility] for weight loss help. Discussed side effects and benefits of medication during visit today.  Discussed how to titrate medication.  Follow up in 1 month for reevaluation.  ?

## 2021-05-19 ENCOUNTER — Telehealth: Payer: Self-pay

## 2021-05-19 ENCOUNTER — Other Ambulatory Visit: Payer: Self-pay

## 2021-05-19 NOTE — Telephone Encounter (Signed)
PA for [facility] submitted and approved via Cover My Meds. Key: [id number] ? ?Patient notified of approval via mychart message.  ?

## 2021-05-26 ENCOUNTER — Encounter: Payer: Self-pay | Admitting: Nurse Practitioner

## 2021-06-01 MED ORDER — SEMAGLUTIDE-WEIGHT MANAGEMENT 0.25 MG/0.5ML ~~LOC~~ SOAJ: 0.2500 mg | SUBCUTANEOUS | 0 refills | Status: AC

## 2021-06-28 NOTE — Progress Notes (Signed)
? ?BP 137/73   Pulse 69   Temp 98.6 ?F (37 ?C) (Oral)   Wt (!) 330 lb 12.8 oz (150 kg)   SpO2 97%   BMI 55.05 kg/m?   ? ?Subjective:  ? ? Patient ID: Diana Sexton, female    DOB: 1985/08/01, 36 y.o.   MRN: [id number] ? ?HPI: ?Diana Sexton is a 36 y.o. female ? ?Chief Complaint  ?Patient presents with  ? Weight Check  ?  Follow up   ? ?WEIGHT LOSS ?Duration: 6 weeks. On Ozempic 0.'5mg'$  weekly. ?Previous attempts at weight loss: yes ?Patient states she did have a little bit of nausea at first but she is now taking it at night and now she doesn't have the nausea. ? ? ?Relevant past medical, surgical, family and social history reviewed and updated as indicated. Interim medical history since our last visit reviewed. ?Allergies and medications reviewed and updated. ? ?Review of Systems  ?Gastrointestinal:  Positive for nausea.  ? ?Per HPI unless specifically indicated above ? ?   ?Objective:  ?  ?BP 137/73   Pulse 69   Temp 98.6 ?F (37 ?C) (Oral)   Wt (!) 330 lb 12.8 oz (150 kg)   SpO2 97%   BMI 55.05 kg/m?   ?Wt Readings from Last 3 Encounters:  ?06/29/21 (!) 330 lb 12.8 oz (150 kg)  ?05/18/21 (!) 341 lb 9.6 oz (154.9 kg)  ?04/29/21 (!) 340 lb 12.8 oz (154.6 kg)  ?  ?Physical Exam ?Vitals and nursing note reviewed.  ?Constitutional:   ?   General: She is not in acute distress. ?   Appearance: Normal appearance. She is normal weight. She is not ill-appearing, toxic-appearing or diaphoretic.  ?HENT:  ?   Head: Normocephalic.  ?   Right Ear: External ear normal.  ?   Left Ear: External ear normal.  ?   Nose: Nose normal.  ?   Mouth/Throat:  ?   Mouth: Mucous membranes are moist.  ?   Pharynx: Oropharynx is clear.  ?Eyes:  ?   General:     ?   Right eye: No discharge.     ?   Left eye: No discharge.  ?   Extraocular Movements: Extraocular movements intact.  ?   Conjunctiva/sclera: Conjunctivae normal.  ?   Pupils: Pupils are equal, round, and reactive to light.  ?Cardiovascular:  ?   Rate and Rhythm: Normal  rate and regular rhythm.  ?   Heart sounds: No murmur heard. ?Pulmonary:  ?   Effort: Pulmonary effort is normal. No respiratory distress.  ?   Breath sounds: Normal breath sounds. No wheezing or rales.  ?Musculoskeletal:  ?   Cervical back: Normal range of motion and neck supple.  ?Skin: ?   General: Skin is warm and dry.  ?   Capillary Refill: Capillary refill takes less than 2 seconds.  ?Neurological:  ?   General: No focal deficit present.  ?   Mental Status: She is alert and oriented to person, place, and time. Mental status is at baseline.  ?Psychiatric:     ?   Mood and Affect: Mood normal.     ?   Behavior: Behavior normal.     ?   Thought Content: Thought content normal.     ?   Judgment: Judgment normal.  ? ? ?Results for orders placed or performed in visit on 04/29/21  ?Urine Culture  ? Specimen: Urine  ? UR  ?Result Value Ref  Range  ? Urine Culture, Routine Final report   ? Organism ID, Bacteria Comment   ?Microscopic Examination  ?Result Value Ref Range  ? WBC, UA 0-5 0 - 5 /hpf  ? RBC >30 (A) 0 - 2 /hpf  ? Epithelial Cells (non renal) 0-10 0 - 10 /hpf  ? Casts None seen None seen /lpf  ? Bacteria, UA None seen None seen/Few  ?Urinalysis, Routine w reflex microscopic  ?Result Value Ref Range  ? Specific Gravity, UA 1.024 1.005 - 1.030  ? pH, UA 5.0 5.0 - 7.5  ? Color, UA Yellow Yellow  ? Appearance Ur Clear Clear  ? Leukocytes,UA Negative Negative  ? Protein,UA 1+ (A) Negative/Trace  ? Glucose, UA Negative Negative  ? Ketones, UA Negative Negative  ? RBC, UA 2+ (A) Negative  ? Bilirubin, UA Negative Negative  ? Urobilinogen, Ur 0.2 0.2 - 1.0 mg/dL  ? Nitrite, UA Negative Negative  ? Microscopic Examination See below:   ? ?   ?Assessment & Plan:  ? ?Problem List Items Addressed This Visit   ? ?  ? Other  ? Morbid obesity ([place]) - Primary  ?  Chronic. Has lost 10lbs since last visit.  Praised for weight loss.  Complete 4 doses of Ozempic 0.'5mg'$  weekly.  New dose of '1mg'$  weekly sent to the pharmacy.  Follow  up in 2 months for reevaluation.  Call sooner if concerns arise.  ? ?  ?  ? Relevant Medications  ? Semaglutide, 1 MG/DOSE, 4 MG/3ML SOPN  ?  ? ?Follow up plan: ?Return in about 2 months (around 08/29/2021) for Weight Managment. ? ? ? ? ? ?

## 2021-06-29 ENCOUNTER — Ambulatory Visit: Payer: BC Managed Care – PPO | Admitting: Nurse Practitioner

## 2021-06-29 ENCOUNTER — Encounter: Payer: Self-pay | Admitting: Nurse Practitioner

## 2021-06-29 MED ORDER — SEMAGLUTIDE (1 MG/DOSE) 4 MG/3ML ~~LOC~~ SOPN: 1.0000 mg | PEN_INJECTOR | SUBCUTANEOUS | 0 refills | Status: DC

## 2021-06-29 NOTE — Assessment & Plan Note (Signed)
Chronic. Has lost 10lbs since last visit.  Praised for weight loss.  Complete 4 doses of Ozempic 0.'5mg'$  weekly.  New dose of '1mg'$  weekly sent to the pharmacy.  Follow up in 2 months for reevaluation.  Call sooner if concerns arise.  ?

## 2021-06-30 ENCOUNTER — Telehealth: Payer: Self-pay

## 2021-06-30 ENCOUNTER — Encounter: Payer: Self-pay | Admitting: Nurse Practitioner

## 2021-06-30 MED ORDER — SEMAGLUTIDE-WEIGHT MANAGEMENT 1.7 MG/0.75ML ~~LOC~~ SOAJ: 1.7000 mg | SUBCUTANEOUS | 0 refills | Status: DC

## 2021-06-30 MED ORDER — SEMAGLUTIDE-WEIGHT MANAGEMENT 0.25 MG/0.5ML ~~LOC~~ SOAJ: 0.2500 mg | SUBCUTANEOUS | 0 refills | Status: AC

## 2021-06-30 MED ORDER — SEMAGLUTIDE-WEIGHT MANAGEMENT 0.5 MG/0.5ML ~~LOC~~ SOAJ: 0.5000 mg | SUBCUTANEOUS | 0 refills | Status: DC

## 2021-06-30 MED ORDER — SEMAGLUTIDE-WEIGHT MANAGEMENT 2.4 MG/0.75ML ~~LOC~~ SOAJ: 2.4000 mg | SUBCUTANEOUS | 0 refills | Status: DC

## 2021-06-30 MED ORDER — SEMAGLUTIDE-WEIGHT MANAGEMENT 1 MG/0.5ML ~~LOC~~ SOAJ: 1.0000 mg | SUBCUTANEOUS | 0 refills | Status: DC

## 2021-06-30 NOTE — Addendum Note (Signed)
Addended by: Jon Billings on: 06/30/2021 09:11 AM   Modules accepted: Orders

## 2021-06-30 NOTE — Telephone Encounter (Signed)
Medication changed to [facility].

## 2021-06-30 NOTE — Telephone Encounter (Signed)
Patient has been notified

## 2021-06-30 NOTE — Telephone Encounter (Signed)
PA initiated for Ozempic and denied due to patient not being diabetic, is there an alternative? Please advise.

## 2021-08-12 ENCOUNTER — Telehealth: Payer: Self-pay | Admitting: Nurse Practitioner

## 2021-08-12 NOTE — Telephone Encounter (Signed)
Pt stated pharmacy advised her medication, Semaglutide-Weight Management, 1 is back ordered; however, they do have .7  Pt stated she will be missing a big step and is unsure of what to do. Pt stated that 1 is permanently back ordered per pharmacy. Pt is requesting to speak with someone today and stated she needs the pharmacy to order medication for her today.  Pt mentioned that she is unsure if her insurance will cover this.  Please advise.

## 2021-08-15 ENCOUNTER — Telehealth: Payer: Self-pay | Admitting: Nurse Practitioner

## 2021-08-15 ENCOUNTER — Encounter: Payer: Self-pay | Admitting: Nurse Practitioner

## 2021-08-15 ENCOUNTER — Ambulatory Visit: Payer: Self-pay

## 2021-08-15 NOTE — Telephone Encounter (Signed)
Summary: Med questions for a nurse   Pt called requesting to speak to a nurse about missing medication, says she only wants to explain what is going on with a nurse and not an agent.     Pt given clarification on what her next dose is (1 mg). Pt stated her pharmacy does not have it and was advised med was on back order. Advised pt to try [facility] out pt pharmacy. Advised pt when she finds a pharmacy, call back and advise where to send rx to.

## 2021-08-15 NOTE — Telephone Encounter (Signed)
Please let patient know that I do not recommend skipping one of the titration doses.  If she is able to find the dose she needs at a different pharmacy then I can send in the dose there.

## 2021-08-15 NOTE — Telephone Encounter (Signed)
Patient verbalized understanding. Advised patient to call insurance to see what pharmacies accept her insurance and then to call pharmacies to see if they have her medication in stock, otherwise there is not much we can due since this is on a national back order.

## 2021-08-15 NOTE — Telephone Encounter (Signed)
Patient states she was advised by [facility] due to nationwide shortage of Semaglutide-Weight Management  to request Saxenda as an alternate  Patient states she spke w/ her insurance and was advised that saxenda would require a PA  Patient inquiring if saxenda can be prescribed as an alternate and a PA initiated  Please advise and fu w/ patient

## 2021-08-17 MED ORDER — SAXENDA 18 MG/3ML ~~LOC~~ SOPN: 0.6000 mg | PEN_INJECTOR | Freq: Every day | SUBCUTANEOUS | 0 refills | Status: DC

## 2021-08-17 NOTE — Telephone Encounter (Signed)
Routing to provider to advise.  

## 2021-08-17 NOTE — Telephone Encounter (Signed)
Diana Sexton is an alternative however, it is a daily injection and not weekly.  If patient is okay with that I will send it to the pharmacy.

## 2021-08-17 NOTE — Telephone Encounter (Signed)
Patient states she is okay with Saxenda. [facility] for pharmacy.

## 2021-08-18 ENCOUNTER — Telehealth: Payer: Self-pay

## 2021-08-18 NOTE — Telephone Encounter (Signed)
Pt approved for saxenda through covermymeds, pt has been notified through [employer].  Key: [id number]

## 2021-08-24 DIAGNOSIS — M9903 Segmental and somatic dysfunction of lumbar region: Secondary | ICD-10-CM | POA: Diagnosis not present

## 2021-08-24 DIAGNOSIS — M9904 Segmental and somatic dysfunction of sacral region: Secondary | ICD-10-CM | POA: Diagnosis not present

## 2021-08-24 DIAGNOSIS — S336XXA Sprain of sacroiliac joint, initial encounter: Secondary | ICD-10-CM | POA: Diagnosis not present

## 2021-08-24 DIAGNOSIS — M545 Low back pain, unspecified: Secondary | ICD-10-CM | POA: Diagnosis not present

## 2021-08-25 DIAGNOSIS — M9903 Segmental and somatic dysfunction of lumbar region: Secondary | ICD-10-CM | POA: Diagnosis not present

## 2021-08-25 DIAGNOSIS — S336XXA Sprain of sacroiliac joint, initial encounter: Secondary | ICD-10-CM | POA: Diagnosis not present

## 2021-08-25 DIAGNOSIS — M9904 Segmental and somatic dysfunction of sacral region: Secondary | ICD-10-CM | POA: Diagnosis not present

## 2021-08-25 DIAGNOSIS — M545 Low back pain, unspecified: Secondary | ICD-10-CM | POA: Diagnosis not present

## 2021-08-26 DIAGNOSIS — M9903 Segmental and somatic dysfunction of lumbar region: Secondary | ICD-10-CM | POA: Diagnosis not present

## 2021-08-26 DIAGNOSIS — S336XXA Sprain of sacroiliac joint, initial encounter: Secondary | ICD-10-CM | POA: Diagnosis not present

## 2021-08-26 DIAGNOSIS — M9904 Segmental and somatic dysfunction of sacral region: Secondary | ICD-10-CM | POA: Diagnosis not present

## 2021-08-26 DIAGNOSIS — M545 Low back pain, unspecified: Secondary | ICD-10-CM | POA: Diagnosis not present

## 2021-08-27 ENCOUNTER — Emergency Department
Admission: EM | Admit: 2021-08-27 | Discharge: 2021-08-27 | Disposition: A | Discharge status: Home / Self Care | Payer: BC Managed Care – PPO | Attending: Emergency Medicine | Admitting: Emergency Medicine

## 2021-08-27 ENCOUNTER — Encounter: Payer: Self-pay | Admitting: Emergency Medicine

## 2021-08-27 ENCOUNTER — Emergency Department: Payer: BC Managed Care – PPO

## 2021-08-27 ENCOUNTER — Other Ambulatory Visit: Payer: Self-pay

## 2021-08-27 DIAGNOSIS — R590 Localized enlarged lymph nodes: Secondary | ICD-10-CM | POA: Diagnosis not present

## 2021-08-27 DIAGNOSIS — R1032 Left lower quadrant pain: Secondary | ICD-10-CM | POA: Diagnosis not present

## 2021-08-27 DIAGNOSIS — R0602 Shortness of breath: Secondary | ICD-10-CM | POA: Diagnosis not present

## 2021-08-27 DIAGNOSIS — R1012 Left upper quadrant pain: Secondary | ICD-10-CM | POA: Diagnosis not present

## 2021-08-27 DIAGNOSIS — N200 Calculus of kidney: Secondary | ICD-10-CM

## 2021-08-27 DIAGNOSIS — R109 Unspecified abdominal pain: Secondary | ICD-10-CM | POA: Diagnosis not present

## 2021-08-27 DIAGNOSIS — R079 Chest pain, unspecified: Secondary | ICD-10-CM | POA: Diagnosis not present

## 2021-08-27 DIAGNOSIS — R9431 Abnormal electrocardiogram [ECG] [EKG]: Secondary | ICD-10-CM | POA: Diagnosis not present

## 2021-08-27 DIAGNOSIS — Z9049 Acquired absence of other specified parts of digestive tract: Secondary | ICD-10-CM | POA: Diagnosis not present

## 2021-08-27 LAB — URINALYSIS, ROUTINE W REFLEX MICROSCOPIC
Bilirubin Urine: NEGATIVE
Glucose, UA: NEGATIVE mg/dL
Ketones, ur: NEGATIVE mg/dL
Nitrite: NEGATIVE
Protein, ur: >=300 mg/dL — AB
RBC / HPF: >50 RBC/hpf — ABNORMAL HIGH (ref 0–5)
Specific Gravity, Urine: 1.028 (ref 1.005–1.030)
pH: 5 (ref 5.0–8.0)

## 2021-08-27 LAB — CBC WITH DIFFERENTIAL/PLATELET
Abs Immature Granulocytes: 0.04 10*3/uL (ref 0.00–0.07)
Basophils Absolute: 0.1 10*3/uL (ref 0.0–0.1)
Basophils Relative: 1 %
Eosinophils Absolute: 0.2 10*3/uL (ref 0.0–0.5)
Eosinophils Relative: 2 %
HCT: 42.6 % (ref 36.0–46.0)
Hemoglobin: 13.6 g/dL (ref 12.0–15.0)
Immature Granulocytes: 1 %
Lymphocytes Relative: 24 %
Lymphs Abs: 1.8 10*3/uL (ref 0.7–4.0)
MCH: 24.9 pg — ABNORMAL LOW (ref 26.0–34.0)
MCHC: 31.9 g/dL (ref 30.0–36.0)
MCV: 78 fL — ABNORMAL LOW (ref 80.0–100.0)
Monocytes Absolute: 0.4 10*3/uL (ref 0.1–1.0)
Monocytes Relative: 6 %
Neutro Abs: 5.1 10*3/uL (ref 1.7–7.7)
Neutrophils Relative %: 66 %
Platelets: 452 10*3/uL — ABNORMAL HIGH (ref 150–400)
RBC: 5.46 MIL/uL — ABNORMAL HIGH (ref 3.87–5.11)
RDW: 14.5 % (ref 11.5–15.5)
WBC: 7.7 10*3/uL (ref 4.0–10.5)
nRBC: 0 % (ref 0.0–0.2)

## 2021-08-27 LAB — COMPREHENSIVE METABOLIC PANEL
ALT: 27 U/L (ref 0–44)
AST: 26 U/L (ref 15–41)
Albumin: 3.8 g/dL (ref 3.5–5.0)
Alkaline Phosphatase: 64 U/L (ref 38–126)
Anion gap: 9 (ref 5–15)
BUN: 10 mg/dL (ref 6–20)
CO2: 23 mmol/L (ref 22–32)
Calcium: 9.1 mg/dL (ref 8.9–10.3)
Chloride: 103 mmol/L (ref 98–111)
Creatinine, Ser: 0.75 mg/dL (ref 0.44–1.00)
GFR, Estimated: >60 mL/min (ref >60)
Glucose, Bld: 99 mg/dL (ref 70–99)
Potassium: 4.1 mmol/L (ref 3.5–5.1)
Sodium: 135 mmol/L (ref 135–145)
Total Bilirubin: 0.5 mg/dL (ref 0.3–1.2)
Total Protein: 8 g/dL (ref 6.5–8.1)

## 2021-08-27 LAB — LIPASE, BLOOD: Lipase: 30 U/L (ref 11–51)

## 2021-08-27 LAB — POC URINE PREG, ED: Preg Test, Ur: NEGATIVE

## 2021-08-27 LAB — TROPONIN I (HIGH SENSITIVITY)
Troponin I (High Sensitivity): 3 ng/L (ref <18)
Troponin I (High Sensitivity): 3 ng/L (ref <18)

## 2021-08-27 MED ORDER — SULFAMETHOXAZOLE-TRIMETHOPRIM 800-160 MG PO TABS: 1.0000 | ORAL_TABLET | Freq: Two times a day (BID) | ORAL | 0 refills | Status: AC

## 2021-08-27 MED ORDER — HYDROMORPHONE HCL 2 MG PO TABS: 2.0000 mg | ORAL_TABLET | Freq: Two times a day (BID) | ORAL | 0 refills | Status: DC | PRN

## 2021-08-27 MED ORDER — HYDROMORPHONE HCL 1 MG/ML IJ SOLN
0.5000 mg | Freq: Once | INTRAMUSCULAR | Status: AC
  Administered 2021-08-27: 0.5 mg via INTRAVENOUS
  Filled 2021-08-27: qty 0.5

## 2021-08-27 MED ORDER — ONDANSETRON 4 MG PO TBDP: 4.0000 mg | ORAL_TABLET | Freq: Three times a day (TID) | ORAL | 0 refills | Status: AC | PRN

## 2021-08-27 MED ORDER — IOHEXOL 350 MG/ML SOLN
100.0000 mL | Freq: Once | INTRAVENOUS | Status: AC | PRN
  Administered 2021-08-27: 100 mL via INTRAVENOUS

## 2021-08-27 MED ORDER — SODIUM CHLORIDE 0.9 % IV BOLUS
1000.0000 mL | Freq: Once | INTRAVENOUS | Status: AC
  Administered 2021-08-27: 1000 mL via INTRAVENOUS

## 2021-08-27 MED ORDER — KETOROLAC TROMETHAMINE 15 MG/ML IJ SOLN
15.0000 mg | Freq: Once | INTRAMUSCULAR | Status: AC
  Administered 2021-08-27: 15 mg via INTRAVENOUS
  Filled 2021-08-27: qty 1

## 2021-08-27 MED ORDER — ONDANSETRON HCL 4 MG/2ML IJ SOLN
4.0000 mg | Freq: Once | INTRAMUSCULAR | Status: AC
  Administered 2021-08-27: 4 mg via INTRAVENOUS
  Filled 2021-08-27: qty 2

## 2021-08-27 MED ORDER — SULFAMETHOXAZOLE-TRIMETHOPRIM 800-160 MG PO TABS: 1.0000 | ORAL_TABLET | Freq: Two times a day (BID) | ORAL | 0 refills | Status: DC

## 2021-08-27 MED ORDER — ONDANSETRON 4 MG PO TBDP: 4.0000 mg | ORAL_TABLET | Freq: Three times a day (TID) | ORAL | 0 refills | Status: DC | PRN

## 2021-08-27 NOTE — ED Triage Notes (Signed)
C/O LLQ/ flank pain x 20 minutes.    AAOx3.  Patient diaphoretic.

## 2021-08-27 NOTE — ED Notes (Signed)
Pt verbalized understanding of discharge instructions, prescriptions, and follow-up care instructions. Pt advised if symptoms worsen to return to ED.

## 2021-08-27 NOTE — ED Notes (Signed)
Pt taken for CT 

## 2021-08-27 NOTE — Discharge Instructions (Addendum)
You have a kidney stone. See report below.   Take ibuprofen '600mg'$  every 8 hours daily (as long as you are not on any other blood thinners or have kidney disease) STOP TAKING MONDAY FOR POSSIBLE LITHOTRIPSY Take tylenol 1g every 8 hours daily. Take dilaudid for breakthrough pain. Do not drive, work, or operate machinery while on this.  Take zofran to help with nausea. Call urology number above to schedule outpatient appointment. Return to ED for fevers, unable to keep food down, or any other concerns.   Take oxycodone as prescribed. Do not drink alcohol, drive or participate in any other potentially dangerous activities while taking this medication as it may make you sleepy. Do not take this medication with any other sedating medications, either prescription or over-the-counter. If you were prescribed Percocet or Vicodin, do not take these with acetaminophen (Tylenol) as it is already contained within these medications.  This medication is an opiate (or narcotic) pain medication and can be habit forming. Use it as little as possible to achieve adequate pain control. Do not use or use it with extreme caution if you have a history of opiate abuse or dependence. If you are on a pain contract with your primary care doctor or a pain specialist, be sure to let them know you were prescribed this medication today from the Emergency Department. This medication is intended for your use only - do not give any to anyone else and keep it in a secure place where nobody else, especially children, have access to it.  IMPRESSION: Bilateral nonobstructing nephrolithiasis including a 10 x 9 mm stone within the left renal pelvis. No hydronephrosis.

## 2021-08-27 NOTE — ED Provider Notes (Signed)
[facility] Provider Note    Event Date/Time   First MD Initiated Contact with Patient 08/27/21 1013     (approximate)   History   Abdominal Pain   HPI  Diana Sexton is a 36 y.o. female who comes in with left lower quadrant flank pain for the past 20 minutes.  Patient reports sudden onset of severe left upper flank pain  She does have a history of kidney stones has her gallbladder removed has had prior ablation.  Denies any significant pain with palpation over the lower abdomen.  Does have some associated nausea without vomiting.  Reports that her for her prior kidney stone she had to have 1 that was stented and 1 that had lithotripsy.  Pt previously on eliquis but no longer. Reports when she travels she takes lovenox.  She does report feeling like she is catching her breath and some associated shortness of breath with it  Physical Exam   Triage Vital Signs: ED Triage Vitals  Enc Vitals Group     BP 08/27/21 1013 (!) 157/84     Pulse Rate 08/27/21 1013 87     Resp 08/27/21 1013 18     Temp 08/27/21 1013 97.8 F (36.6 C)     Temp Source 08/27/21 1013 Oral     SpO2 08/27/21 1013 98 %     Weight 08/27/21 1006 (!) 330 lb 11 oz (150 kg)     Height 08/27/21 1006 '5\' 5"'$  (1.651 m)     Head Circumference --      Peak Flow --      Pain Score 08/27/21 1006 10     Pain Loc --      Pain Edu? --      Excl. in [place]? --     Most recent vital signs: Vitals:   08/27/21 1013  BP: (!) 157/84  Pulse: 87  Resp: 18  Temp: 97.8 F (36.6 C)  SpO2: 98%     General: Awake, no distress.  CV:  Good peripheral perfusion.  Resp:  Normal effort.  Abd:  No distention.  Other:  Patient appears uncomfortable has left upper Abdomen pain however is soft and nontender.   ED Results / Procedures / Treatments   Labs (all labs ordered are listed, but only abnormal results are displayed) Labs Reviewed  CBC WITH DIFFERENTIAL/PLATELET  COMPREHENSIVE METABOLIC PANEL   LIPASE, BLOOD  URINALYSIS, ROUTINE W REFLEX MICROSCOPIC  POC URINE PREG, ED     EKG  My interpretation of EKG:  Sinus rate of 78 without any ST elevation or T wave inversions, occasional PVC  RADIOLOGY I have reviewed the CT personally and interpreted and patient appears to have a left-sided kidney stone in kidney    PROCEDURES:  Critical Care performed: No  .1-3 Lead EKG Interpretation  Performed by: Vanessa [place], MD Authorized by: Vanessa [place], MD     Interpretation: normal     ECG rate:  80   ECG rate assessment: normal     Rhythm: sinus rhythm     Ectopy: PVCs     Conduction: normal      MEDICATIONS ORDERED IN ED: Medications  ondansetron (ZOFRAN) injection 4 mg (has no administration in time range)  HYDROmorphone (DILAUDID) injection 0.5 mg (has no administration in time range)     IMPRESSION / MDM / ASSESSMENT AND PLAN / ED COURSE  I reviewed the triage vital signs and the nursing notes.   Patient's  presentation is most consistent with acute presentation with potential threat to life or bodily function.   Differential includes kidney stone, ovarian pathology, diverticulitis.  Based upon my examination I suspect it is more likely kidney stone.  No right lower quadrant pain to suggest appendicitis.  We will give patient IV fluids IV Zofran IV Dilaudid and get a CT renal to further evaluate.  CBC shows normal white count patient had no fevers.  Pregnancy test negative.  CMP reassuring lipase reassuring urine overall reassuring but does have some WBCs and RBCs in it but nitrite negative.  IMPRESSION: Bilateral nonobstructing nephrolithiasis including a 10 x 9 mm stone within the left renal pelvis. No hydronephrosis.  I discussed with the case with urology Dr. Felipa Eth given the kidney stone is not currently obstructing does not need to have emergent stent or lithotripsy done today but does need very close follow-up with urology next week.  Patient's been  seen by Dr. Bernardo Heater previously and he recommends that she calls them on Monday to get an appointment early next week.  In the meantime he agrees that does not appear to be a septic stone but can start on a Bactrim and give some pain medicine.  I discussed with patient that given she is off Eliquis she is very high risk for PE and she does report recent travel to [place] but there are no signs of DVT.  Discussed with her that this kidney stone could be causing the Pain but given her high risk and associated shortness of breath when she had the pain and we will get CT PE to rule out pulmonary embolism.  If this is negative patient feels comfortable with discharge home.  Patient has allergy to oxycodone but has done well with Dilaudid in the past we will prescribe this  The patient is on the cardiac monitor to evaluate for evidence of arrhythmia and/or significant heart rate changes.      FINAL CLINICAL IMPRESSION(S) / ED DIAGNOSES   Final diagnoses:  Kidney stone     Rx / DC Orders   ED Discharge Orders          Ordered    ondansetron (ZOFRAN-ODT) 4 MG disintegrating tablet  Every 8 hours PRN        08/27/21 1427    HYDROmorphone (DILAUDID) 2 MG tablet  Every 12 hours PRN        08/27/21 1427    sulfamethoxazole-trimethoprim (BACTRIM DS) 800-160 MG tablet  2 times daily        08/27/21 1427             Note:  This document was prepared using Dragon voice recognition software and may include unintentional dictation errors.   Vanessa [place], MD 08/27/21 [phone]

## 2021-08-29 ENCOUNTER — Ambulatory Visit: Payer: BC Managed Care – PPO | Admitting: Nurse Practitioner

## 2021-08-29 ENCOUNTER — Encounter: Payer: Self-pay | Admitting: Nurse Practitioner

## 2021-08-29 DIAGNOSIS — M545 Low back pain, unspecified: Secondary | ICD-10-CM | POA: Diagnosis not present

## 2021-08-29 DIAGNOSIS — S336XXA Sprain of sacroiliac joint, initial encounter: Secondary | ICD-10-CM | POA: Diagnosis not present

## 2021-08-29 DIAGNOSIS — M9903 Segmental and somatic dysfunction of lumbar region: Secondary | ICD-10-CM | POA: Diagnosis not present

## 2021-08-29 DIAGNOSIS — M9904 Segmental and somatic dysfunction of sacral region: Secondary | ICD-10-CM | POA: Diagnosis not present

## 2021-08-29 LAB — URINE CULTURE

## 2021-08-29 MED ORDER — SAXENDA 18 MG/3ML ~~LOC~~ SOPN: 1.2000 mg | PEN_INJECTOR | Freq: Every day | SUBCUTANEOUS | 0 refills | Status: DC

## 2021-08-29 NOTE — Progress Notes (Signed)
BP 135/73   Pulse 80   Temp 98 F (36.7 C) (Oral)   Wt (!) 320 lb 3.2 oz (145.2 kg)   LMP 08/08/2021   SpO2 98%   BMI 53.28 kg/m    Subjective:    Patient ID: Diana Sexton, female    DOB: 12-30-1985, 36 y.o.   MRN: [id number]  HPI: Diana Sexton is a 36 y.o. female  Chief Complaint  Patient presents with   Weight Check        WEIGHT GAIN Duration: years Previous attempts at weight loss: yes Complications of obesity: IFG Peak weight: 340lb Weight loss goal:  Weight loss to date:  Requesting obesity pharmacotherapy: yes Current weight loss supplements/medications: no Previous weight loss supplements/meds: no Calories:  Patient has lost about 20lbs   Relevant past medical, surgical, family and social history reviewed and updated as indicated. Interim medical history since our last visit reviewed. Allergies and medications reviewed and updated.  Review of Systems  Constitutional:  Negative for unexpected weight change.    Per HPI unless specifically indicated above     Objective:    BP 135/73   Pulse 80   Temp 98 F (36.7 C) (Oral)   Wt (!) 320 lb 3.2 oz (145.2 kg)   LMP 08/08/2021   SpO2 98%   BMI 53.28 kg/m   Wt Readings from Last 3 Encounters:  08/29/21 (!) 320 lb 3.2 oz (145.2 kg)  08/27/21 (!) 330 lb 11 oz (150 kg)  06/29/21 (!) 330 lb 12.8 oz (150 kg)    Physical Exam Vitals and nursing note reviewed.  Constitutional:      General: She is not in acute distress.    Appearance: Normal appearance. She is obese. She is not ill-appearing, toxic-appearing or diaphoretic.  HENT:     Head: Normocephalic.     Right Ear: External ear normal.     Left Ear: External ear normal.     Nose: Nose normal.     Mouth/Throat:     Mouth: Mucous membranes are moist.     Pharynx: Oropharynx is clear.  Eyes:     General:        Right eye: No discharge.        Left eye: No discharge.     Extraocular Movements: Extraocular movements intact.      Conjunctiva/sclera: Conjunctivae normal.     Pupils: Pupils are equal, round, and reactive to light.  Cardiovascular:     Rate and Rhythm: Normal rate and regular rhythm.     Heart sounds: No murmur heard. Pulmonary:     Effort: Pulmonary effort is normal. No respiratory distress.     Breath sounds: Normal breath sounds. No wheezing or rales.  Musculoskeletal:     Cervical back: Normal range of motion and neck supple.  Skin:    General: Skin is warm and dry.     Capillary Refill: Capillary refill takes less than 2 seconds.  Neurological:     General: No focal deficit present.     Mental Status: She is alert and oriented to person, place, and time. Mental status is at baseline.  Psychiatric:        Mood and Affect: Mood normal.        Behavior: Behavior normal.        Thought Content: Thought content normal.        Judgment: Judgment normal.     Results for orders placed or performed during the hospital  encounter of 08/27/21  Urine Culture   Specimen: Urine, Clean Catch  Result Value Ref Range   Specimen Description      URINE, CLEAN CATCH Performed at [facility], [address] [place], [place] [address]    Special Requests      NONE Performed at [facility], [address] [place], [place] [address]    Culture MULTIPLE SPECIES PRESENT, SUGGEST RECOLLECTION (A)    Report Status 08/29/2021 FINAL   CBC with Differential  Result Value Ref Range   WBC 7.7 4.0 - 10.5 K/uL   RBC 5.46 (H) 3.87 - 5.11 MIL/uL   Hemoglobin 13.6 12.0 - 15.0 g/dL   HCT 42.6 36.0 - 46.0 %   MCV 78.0 (L) 80.0 - 100.0 fL   MCH 24.9 (L) 26.0 - 34.0 pg   MCHC 31.9 30.0 - 36.0 g/dL   RDW 14.5 11.5 - 15.5 %   Platelets 452 (H) 150 - 400 K/uL   nRBC 0.0 0.0 - 0.2 %   Neutrophils Relative % 66 %   Neutro Abs 5.1 1.7 - 7.7 K/uL   Lymphocytes Relative 24 %   Lymphs Abs 1.8 0.7 - 4.0 K/uL   Monocytes Relative 6 %   Monocytes Absolute 0.4 0.1 - 1.0 K/uL   Eosinophils Relative 2  %   Eosinophils Absolute 0.2 0.0 - 0.5 K/uL   Basophils Relative 1 %   Basophils Absolute 0.1 0.0 - 0.1 K/uL   Immature Granulocytes 1 %   Abs Immature Granulocytes 0.04 0.00 - 0.07 K/uL  Comprehensive metabolic panel  Result Value Ref Range   Sodium 135 135 - 145 mmol/L   Potassium 4.1 3.5 - 5.1 mmol/L   Chloride 103 98 - 111 mmol/L   CO2 23 22 - 32 mmol/L   Glucose, Bld 99 70 - 99 mg/dL   BUN 10 6 - 20 mg/dL   Creatinine, Ser 0.75 0.44 - 1.00 mg/dL   Calcium 9.1 8.9 - 10.3 mg/dL   Total Protein 8.0 6.5 - 8.1 g/dL   Albumin 3.8 3.5 - 5.0 g/dL   AST 26 15 - 41 U/L   ALT 27 0 - 44 U/L   Alkaline Phosphatase 64 38 - 126 U/L   Total Bilirubin 0.5 0.3 - 1.2 mg/dL   GFR, Estimated >60 >60 mL/min   Anion gap 9 5 - 15  Lipase, blood  Result Value Ref Range   Lipase 30 11 - 51 U/L  Urinalysis, Routine w reflex microscopic Urine, Clean Catch  Result Value Ref Range   Color, Urine YELLOW (A) YELLOW   APPearance CLOUDY (A) CLEAR   Specific Gravity, Urine 1.028 1.005 - 1.030   pH 5.0 5.0 - 8.0   Glucose, UA NEGATIVE NEGATIVE mg/dL   Hgb urine dipstick LARGE (A) NEGATIVE   Bilirubin Urine NEGATIVE NEGATIVE   Ketones, ur NEGATIVE NEGATIVE mg/dL   Protein, ur >=300 (A) NEGATIVE mg/dL   Nitrite NEGATIVE NEGATIVE   Leukocytes,Ua TRACE (A) NEGATIVE   RBC / HPF >50 (H) 0 - 5 RBC/hpf   WBC, UA 21-50 0 - 5 WBC/hpf   Bacteria, UA RARE (A) NONE SEEN   Squamous Epithelial / LPF 6-10 0 - 5   Mucus PRESENT   POC Urine Pregnancy, ED  Result Value Ref Range   Preg Test, Ur NEGATIVE NEGATIVE  Troponin I (High Sensitivity)  Result Value Ref Range   Troponin I (High Sensitivity) 3 <18 ng/L  Troponin I (High Sensitivity)  Result Value Ref Range   Troponin I (High Sensitivity) 3 <18 ng/L      Assessment & Plan:   Problem List Items Addressed This Visit       Other   Morbid obesity ([place])    Chronic. Has been changed to Saxenda due to back order on [employer].  Has lost at total of 20lbs  since starting weight loss medication.  Will increase Saxenda to 1.'2mg'$  daily for 4 weeks then increase to 1.'8mg'$  daily.  Follow up in 2 months for reevaluation. Call sooner if concerns arise.       Relevant Medications   Liraglutide -Weight Management (SAXENDA) 18 MG/3ML SOPN     Follow up plan: Return in about 2 months (around 10/30/2021) for Weight Managment.

## 2021-08-29 NOTE — Assessment & Plan Note (Signed)
Chronic. Has been changed to Saxenda due to back order on [employer].  Has lost at total of 20lbs since starting weight loss medication.  Will increase Saxenda to 1.'2mg'$  daily for 4 weeks then increase to 1.'8mg'$  daily.  Follow up in 2 months for reevaluation. Call sooner if concerns arise.

## 2021-08-30 ENCOUNTER — Ambulatory Visit: Payer: BC Managed Care – PPO | Admitting: Physician Assistant

## 2021-08-30 ENCOUNTER — Encounter: Payer: Self-pay | Admitting: Physician Assistant

## 2021-08-30 ENCOUNTER — Other Ambulatory Visit: Payer: Self-pay

## 2021-08-30 ENCOUNTER — Other Ambulatory Visit: Payer: Self-pay | Admitting: Physician Assistant

## 2021-08-30 ENCOUNTER — Telehealth: Payer: Self-pay

## 2021-08-30 VITALS — BP 145/83 | HR 80 | Ht 65.0 in | Wt 320.0 lb

## 2021-08-30 DIAGNOSIS — N200 Calculus of kidney: Secondary | ICD-10-CM | POA: Diagnosis not present

## 2021-08-30 LAB — URINALYSIS, COMPLETE
Bilirubin, UA: NEGATIVE
Glucose, UA: NEGATIVE
Ketones, UA: NEGATIVE
Leukocytes,UA: NEGATIVE
Nitrite, UA: NEGATIVE
Specific Gravity, UA: 1.025 (ref 1.005–1.030)
Urobilinogen, Ur: 0.2 mg/dL (ref 0.2–1.0)
pH, UA: 5.5 (ref 5.0–7.5)

## 2021-08-30 LAB — MICROSCOPIC EXAMINATION

## 2021-08-30 MED ORDER — KETOROLAC TROMETHAMINE 60 MG/2ML IM SOLN
15.0000 mg | Freq: Once | INTRAMUSCULAR | Status: AC
  Administered 2021-08-30: 15 mg via INTRAMUSCULAR

## 2021-08-30 MED ORDER — TAMSULOSIN HCL 0.4 MG PO CAPS: 0.4000 mg | ORAL_CAPSULE | Freq: Every day | ORAL | 0 refills | Status: DC

## 2021-08-30 NOTE — Telephone Encounter (Signed)
Fax from pharmacy received stating that [place] does not come in box of 6 mL. It comes in 60m box- please advise/resend.

## 2021-08-30 NOTE — H&P (View-Only) (Signed)
08/30/2021 1:45 PM   Diana Sexton 1985-04-17 [id number]  CC: Chief Complaint  Patient presents with   Nephrolithiasis   HPI: Diana Sexton is a 36 y.o. female with PMH nephrolithiasis who previously underwent ureteroscopy who presents today for ED follow-up of a 1 cm left renal pelvic stone.  She is accompanied today by her husband.  She was seen in the ED 3 days ago with reports of left flank pain radiating to the LLQ.  CT stone study revealed a 1 cm stone in the left renal pelvis as well as punctate left lower pole stone and 3 right nonobstructing stones.  Her UA was notable for hematuria, pyuria, and rare bacteriuria, however it was contaminated with squamous epithelial cells.  Urine culture has since finalized with multiple species.  She was discharged with Zofran, Dilaudid, and empiric Bactrim.  Today she reports continued left flank pain, currently 7/10 in intensity.  She had some moderate stent symptoms with her prior ureteroscopy.  On review of her prior CT, stone measures <1100 HU in density.  Approximate skin to stone distance 15 cm.  In-office UA today positive for 2+ blood and 1+ protein; urine microscopy with 11-30 RBCs/HPF.  PMH: Past Medical History:  Diagnosis Date   Anemia    Anxiety    Cellulitis 2012   caused stillbirth   DVT (deep venous thrombosis) ([place]) 06/2011   under right clavicle   Dysmenorrhea    Family history of breast cancer 11/2013   BRCA/MyRisk neg   H/O blood clots    Headache    related to hormonal meds   Hernia, abdominal    History of kidney stones    History of prothrombin mutation    Increased risk of breast cancer 11/2013   IBIS=27%   Obesity     Surgical History: Past Surgical History:  Procedure Laterality Date   CHOLECYSTECTOMY  2007   CYSTOSCOPY W/ RETROGRADES Left 02/02/2017   Procedure: CYSTOSCOPY WITH RETROGRADE PYELOGRAM;  Surgeon: Abbie Sons, MD;  Location: [facility];  Service: Urology;  Laterality: Left;    CYSTOSCOPY W/ RETROGRADES Right 06/26/2019   Procedure: CYSTOSCOPY WITH RETROGRADE PYELOGRAM;  Surgeon: Abbie Sons, MD;  Location: [facility];  Service: Urology;  Laterality: Right;   CYSTOSCOPY/URETEROSCOPY/HOLMIUM LASER/STENT PLACEMENT Left 02/02/2017   Procedure: CYSTOSCOPY/URETEROSCOPY/HOLMIUM LASER/STENT PLACEMENT;  Surgeon: Abbie Sons, MD;  Location: [facility];  Service: Urology;  Laterality: Left;   CYSTOSCOPY/URETEROSCOPY/HOLMIUM LASER/STENT PLACEMENT Right 06/26/2019   Procedure: CYSTOSCOPY/URETEROSCOPY/HOLMIUM LASER/STENT PLACEMENT;  Surgeon: Abbie Sons, MD;  Location: [facility];  Service: Urology;  Laterality: Right;   DILITATION & CURRETTAGE/HYSTROSCOPY WITH ESSURE     DILITATION & CURRETTAGE/HYSTROSCOPY WITH NOVASURE ABLATION N/A 12/24/2018   Procedure: DILATATION & CURETTAGE/HYSTEROSCOPY WITH ENDOMETRIAL ABLATION (MINERVA SYSTEM);  Surgeon: Will Bonnet, MD;  Location: [facility];  Service: Gynecology;  Laterality: N/A;   HYSTEROSCOPY WITH D & C N/A 01/27/2020   Procedure: DILATATION AND CURETTAGE /HYSTEROSCOPY;  Surgeon: Will Bonnet, MD;  Location: [facility];  Service: Gynecology;  Laterality: N/A;   INTRAUTERINE DEVICE (IUD) INSERTION     LITHOTRIPSY     TONSILLECTOMY     TONSILLECTOMY AND ADENOIDECTOMY     TUBAL LIGATION  2014   XI ROBOTIC ASSISTED VENTRAL HERNIA N/A 01/06/2020   Procedure: XI ROBOTIC ASSISTED VENTRAL HERNIA;  Surgeon: Olean Ree, MD;  Location: [facility];  Service: General;  Laterality: N/A;    Home Medications:  Allergies as of 08/30/2021  Reactions   Oxycodone Rash   Penicillins Rash   Has had a PCN reaction causing, facial/tongue/throat swelling, SOB or lightheadedness with hypotension: NO Has had a PCN reaction causing severe rash involving mucus membranes or skin necrosis: NO Has had a PCN reaction that required hospitalization: No Has had a PCN reaction occurring within the last 10 years: Yes If all of the above  answers are "NO", then may proceed with Cephalosporin use. Updated 01/06/20; Reaction was low severity rash. Ancef given. No reaction        Medication List        Accurate as of August 30, 2021  1:45 PM. If you have any questions, ask your nurse or doctor.          HYDROmorphone 2 MG tablet Commonly known as: Dilaudid Take 1 tablet (2 mg total) by mouth every 12 (twelve) hours as needed for up to 5 days for severe pain.   ibuprofen 800 MG tablet Commonly known as: ADVIL Take 1 tablet (800 mg total) by mouth every 8 (eight) hours as needed. Take three times daily for 3 days. Then every 8 hours as needed.   ondansetron 4 MG disintegrating tablet Commonly known as: ZOFRAN-ODT Take 1 tablet (4 mg total) by mouth every 8 (eight) hours as needed for up to 5 days for nausea or vomiting.   PROBIOTIC PO Take 2 tablets by mouth daily.   Saxenda 18 MG/3ML Sopn Generic drug: Liraglutide -Weight Management Inject 1.2 mg into the skin daily. Inject 1.22m daily for 4 weeks. Then increase to 1.842mdaily   sulfamethoxazole-trimethoprim 800-160 MG tablet Commonly known as: BACTRIM DS Take 1 tablet by mouth 2 (two) times daily for 5 days.   tamsulosin 0.4 MG Caps capsule Commonly known as: FLOMAX Take 1 capsule (0.4 mg total) by mouth daily. Started by: SaDebroah LoopPA-C        Allergies:  Allergies  Allergen Reactions   Oxycodone Rash   Penicillins Rash    Has had a PCN reaction causing, facial/tongue/throat swelling, SOB or lightheadedness with hypotension: NO Has had a PCN reaction causing severe rash involving mucus membranes or skin necrosis: NO Has had a PCN reaction that required hospitalization: No Has had a PCN reaction occurring within the last 10 years: Yes If all of the above answers are "NO", then may proceed with Cephalosporin use. Updated 01/06/20; Reaction was low severity rash. Ancef given. No reaction     Family History: Family History  Problem  Relation Age of Onset   Cancer Mother        breast   Stroke Mother    Diabetes Father    Hypertension Father    Heart disease Maternal Grandfather    Dementia Paternal Grandmother    Bladder Cancer Neg Hx    Kidney cancer Neg Hx     Social History:   reports that she has never smoked. She has never used smokeless tobacco. She reports current alcohol use. She reports that she does not use drugs.  Physical Exam: BP (!) 145/83   Pulse 80   Ht 5' 5"  (1.651 m)   Wt (!) 320 lb (145.2 kg)   LMP 08/08/2021   BMI 53.25 kg/m   Constitutional:  Alert and oriented, no acute distress, nontoxic appearing HEENT: [place], AT Cardiovascular: No clubbing, cyanosis, or edema Respiratory: Normal respiratory effort, no increased work of breathing Skin: No rashes, bruises or suspicious lesions Neurologic: Grossly intact, no focal deficits, moving all 4 extremities  Psychiatric: Normal mood and affect  Laboratory Data: Results for orders placed or performed in visit on 08/30/21  Microscopic Examination   Urine  Result Value Ref Range   WBC, UA 0-5 0 - 5 /hpf   RBC, Urine 11-30 (A) 0 - 2 /hpf   Epithelial Cells (non renal) 0-10 0 - 10 /hpf   Bacteria, UA Few None seen/Few  Urinalysis, Complete  Result Value Ref Range   Specific Gravity, UA 1.025 1.005 - 1.030   pH, UA 5.5 5.0 - 7.5   Color, UA Yellow Yellow   Appearance Ur Clear Clear   Leukocytes,UA Negative Negative   Protein,UA 1+ (A) Negative/Trace   Glucose, UA Negative Negative   Ketones, UA Negative Negative   RBC, UA 2+ (A) Negative   Bilirubin, UA Negative Negative   Urobilinogen, Ur 0.2 0.2 - 1.0 mg/dL   Nitrite, UA Negative Negative   Microscopic Examination See below:    Pertinent Imaging: Results for orders placed during the hospital encounter of 08/27/21  CT Renal Stone Study  Narrative CLINICAL DATA:  Left flank pain for 20 minutes  EXAM: CT ABDOMEN AND PELVIS WITHOUT CONTRAST  TECHNIQUE: Multidetector CT imaging  of the abdomen and pelvis was performed following the standard protocol without IV contrast.  RADIATION DOSE REDUCTION: This exam was performed according to the departmental dose-optimization program which includes automated exposure control, adjustment of the mA and/or kV according to patient size and/or use of iterative reconstruction technique.  COMPARISON:  01/20/2020  FINDINGS: Lower chest: Included lung bases are clear.  Heart size is normal.  Hepatobiliary: No focal liver abnormality is seen. Status post cholecystectomy. No biliary dilatation.  Pancreas: Unremarkable. No pancreatic ductal dilatation or surrounding inflammatory changes.  Spleen: Normal in size without focal abnormality.  Adrenals/Urinary Tract: Unremarkable adrenal glands. 10 x 9 mm stone within the left renal pelvis. Additional punctate stone within the lower pole of the left kidney. There are 3 nonobstructing stones within the right kidney measuring up to 5 mm in diameter. No hydronephrosis of either kidney. Bilateral ureters are unremarkable. No ureteral calculi identified. Urinary bladder is decompressed.  Stomach/Bowel: Stomach is within normal limits. Appendix appears normal (series 2, image 71). No evidence of bowel wall thickening, distention, or inflammatory changes.  Vascular/Lymphatic: No significant vascular finding on noncontrast imaging. There are a few scattered mildly prominent, although non-pathologically enlarged, mesenteric lymph nodes. No abdominopelvic lymphadenopathy by size criteria.  Reproductive: Uterus and bilateral adnexa are unremarkable.  Other: No free fluid. No abdominopelvic fluid collection. No pneumoperitoneum. No abdominal wall hernia.  Musculoskeletal: No acute or significant osseous findings.  IMPRESSION: Bilateral nonobstructing nephrolithiasis including a 10 x 9 mm stone within the left renal pelvis. No hydronephrosis.   Electronically Signed By: Davina Poke D.O. On: 08/27/2021 11:26  I personally reviewed the images referenced above and bilateral nonobstructing nephrolithiasis with a 1 cm left renal pelvic stone.  Assessment & Plan:   1. Left renal stone Left flank plain likely secondary to a ball valving 1 cm left renal pelvic stone.  UA today notable for microscopic hematuria only on empiric Bactrim, though her prior UA in the ED was contaminated and likely did not represent true infection.  We discussed various treatment options for her stones including ESWL vs. left ureteroscopy vs bilateral ureteroscopy with laser lithotripsy and stent. We discussed the risks and benefits of all procedures including bleeding, infection, and damage to surrounding structures.  We specifically discussed that ESWL is a less  invasive procedure that requires less anesthesia, however patients have to pass their residual stone fragments, which may take several weeks and be associated with continued renal colic. Additionally, we discussed the limitations of ESWL including the ability to only treat one stone in a single treatment and the low, 10-20% chance of treatment failure requiring repeat ESWL versus ureteroscopy in the future. By comparison, ureteroscopy is a more invasive surgical procedure that requires more anesthesia, but we can potentially treat multiple stones in a single procedure. However, URS does require placement of a ureteral stent, which will remain in place for approximately 3-10 days and can be associated with flank pain, bladder pain, dysuria, urgency, frequency, urinary leakage, and gross hematuria.  Based on this conversation, she would like to proceed with left ureteroscopy with laser lithotripsy and stent placement.   Patient to complete prescribed Bactrim.  Starting Flomax.  Okay to take Zofran and Dilaudid as prescribed in the ED.  I offered her IM Toradol in clinic today, which she accepted.  She was having some persistent pain after her  first dose of 15 mg and a second dose was administered for a total of 30 mg today.  OR orders placed.  We discussed return precautions including fever, chills, uncontrollable nausea/vomiting, and uncontrollable pain. - Urinalysis, Complete - CULTURE, URINE COMPREHENSIVE - tamsulosin (FLOMAX) 0.4 MG CAPS capsule; Take 1 capsule (0.4 mg total) by mouth daily.  Dispense: 30 capsule; Refill: 0 - ketorolac (TORADOL) injection 15 mg - ketorolac (TORADOL) injection 15 mg  Return in 1 week (on 09/06/2021) for Left ureteroscopy with laser lithotripsy and stent placement with Dr. Bernardo Heater.  Debroah Loop, PA-C  [facility] [address], [place], [place] [address] [phone]

## 2021-08-30 NOTE — Progress Notes (Signed)
[facility] Posting Form   Surgery Date/Time: Date: 09/06/2021  Surgeon: Dr. John Giovanni, MD  Surgery Location: Day Surgery  Inpt ( No  )   Outpt (Yes)   Obs ( No  )   Diagnosis: N20.0 Left Nephrolithiasis  -CPT: 06015  Surgery: Left Ureteroscopy with laser lithotripsy and stent placement  Stop Anticoagulations: No  Cardiac/Medical/Pulmonary Clearance needed: no  *Orders entered into EPIC  Date: 08/30/21   *Case booked in EPIC  Date: 08/30/21  *Notified pt of Surgery: Date: 08/30/21  PRE-OP UA & CX: no  *Placed into Prior Authorization Work Que Date: 08/30/21   Assistant/laser/rep:No

## 2021-08-30 NOTE — Patient Instructions (Signed)
I'll get you scheduled for ureteroscopy with Dr. Bernardo Heater next Tuesday. In the meantime, please do the following: -Take Flomax 0.'4mg'$  daily as prescribed today -Treat any pain with ibuprofen/tylenol or Dilaudid -Treat any nausea with Zofran  Please call our office immediately (we are open 8a-5p Monday-Friday) or go to the Emergency Department if you develop any of the following: -Fever -Chills -Nausea and/or vomiting uncontrollable with Zofran -Pain uncontrollable with Dilaudid

## 2021-08-30 NOTE — Progress Notes (Signed)
IM Injection  Patient is present today for an IM Injection  Drug: Ketorolac Dose:0.106m ('15mg'$ ) Location:Left Deltoid Lot: [id number]:09/11/2021 Patient tolerated well, no complications were noted  Performed by: SFonnie Jarvis CMA  An additional 0.555mwas given 1543mpost initial injection per Sam.   Drug: Ketorolac Dose:0.5ml48m'15mg'N$ ) Location:Left Deltoid Lot: [id number]:09/11/2021 Patient tolerated well, no complications were noted

## 2021-08-30 NOTE — Telephone Encounter (Signed)
I spoke with Diana Sexton. We have discussed possible surgery dates and Tuesday July 25th, 2023 was agreed upon by all parties. Patient given information about surgery date, what to expect pre-operatively and post operatively.  We discussed that a Pre-Admission Testing office will be calling to set up the pre-op visit that will take place prior to surgery, and that these appointments are typically done over the phone with a Pre-Admissions RN.  Informed patient that our office will communicate any additional care to be provided after surgery. Patients questions or concerns were discussed during our call. Advised to call our office should there be any additional information, questions or concerns that arise. Patient verbalized understanding.

## 2021-08-30 NOTE — Progress Notes (Signed)
08/30/2021 1:45 PM   Diana Sexton 1985/10/30 [id number]  CC: Chief Complaint  Patient presents with   Nephrolithiasis   HPI: Diana Sexton is a 36 y.o. female with PMH nephrolithiasis who previously underwent ureteroscopy who presents today for ED follow-up of a 1 cm left renal pelvic stone.  She is accompanied today by her husband.  She was seen in the ED 3 days ago with reports of left flank pain radiating to the LLQ.  CT stone study revealed a 1 cm stone in the left renal pelvis as well as punctate left lower pole stone and 3 right nonobstructing stones.  Her UA was notable for hematuria, pyuria, and rare bacteriuria, however it was contaminated with squamous epithelial cells.  Urine culture has since finalized with multiple species.  She was discharged with Zofran, Dilaudid, and empiric Bactrim.  Today she reports continued left flank pain, currently 7/10 in intensity.  She had some moderate stent symptoms with her prior ureteroscopy.  On review of her prior CT, stone measures <1100 HU in density.  Approximate skin to stone distance 15 cm.  In-office UA today positive for 2+ blood and 1+ protein; urine microscopy with 11-30 RBCs/HPF.  PMH: Past Medical History:  Diagnosis Date   Anemia    Anxiety    Cellulitis 2012   caused stillbirth   DVT (deep venous thrombosis) ([place]) 06/2011   under right clavicle   Dysmenorrhea    Family history of breast cancer 11/2013   BRCA/MyRisk neg   H/O blood clots    Headache    related to hormonal meds   Hernia, abdominal    History of kidney stones    History of prothrombin mutation    Increased risk of breast cancer 11/2013   IBIS=27%   Obesity     Surgical History: Past Surgical History:  Procedure Laterality Date   CHOLECYSTECTOMY  2007   CYSTOSCOPY W/ RETROGRADES Left 02/02/2017   Procedure: CYSTOSCOPY WITH RETROGRADE PYELOGRAM;  Surgeon: Abbie Sons, MD;  Location: [facility];  Service: Urology;  Laterality: Left;    CYSTOSCOPY W/ RETROGRADES Right 06/26/2019   Procedure: CYSTOSCOPY WITH RETROGRADE PYELOGRAM;  Surgeon: Abbie Sons, MD;  Location: [facility];  Service: Urology;  Laterality: Right;   CYSTOSCOPY/URETEROSCOPY/HOLMIUM LASER/STENT PLACEMENT Left 02/02/2017   Procedure: CYSTOSCOPY/URETEROSCOPY/HOLMIUM LASER/STENT PLACEMENT;  Surgeon: Abbie Sons, MD;  Location: [facility];  Service: Urology;  Laterality: Left;   CYSTOSCOPY/URETEROSCOPY/HOLMIUM LASER/STENT PLACEMENT Right 06/26/2019   Procedure: CYSTOSCOPY/URETEROSCOPY/HOLMIUM LASER/STENT PLACEMENT;  Surgeon: Abbie Sons, MD;  Location: [facility];  Service: Urology;  Laterality: Right;   DILITATION & CURRETTAGE/HYSTROSCOPY WITH ESSURE     DILITATION & CURRETTAGE/HYSTROSCOPY WITH NOVASURE ABLATION N/A 12/24/2018   Procedure: DILATATION & CURETTAGE/HYSTEROSCOPY WITH ENDOMETRIAL ABLATION (MINERVA SYSTEM);  Surgeon: Will Bonnet, MD;  Location: [facility];  Service: Gynecology;  Laterality: N/A;   HYSTEROSCOPY WITH D & C N/A 01/27/2020   Procedure: DILATATION AND CURETTAGE /HYSTEROSCOPY;  Surgeon: Will Bonnet, MD;  Location: [facility];  Service: Gynecology;  Laterality: N/A;   INTRAUTERINE DEVICE (IUD) INSERTION     LITHOTRIPSY     TONSILLECTOMY     TONSILLECTOMY AND ADENOIDECTOMY     TUBAL LIGATION  2014   XI ROBOTIC ASSISTED VENTRAL HERNIA N/A 01/06/2020   Procedure: XI ROBOTIC ASSISTED VENTRAL HERNIA;  Surgeon: Olean Ree, MD;  Location: [facility];  Service: General;  Laterality: N/A;    Home Medications:  Allergies as of 08/30/2021  Reactions   Oxycodone Rash   Penicillins Rash   Has had a PCN reaction causing, facial/tongue/throat swelling, SOB or lightheadedness with hypotension: NO Has had a PCN reaction causing severe rash involving mucus membranes or skin necrosis: NO Has had a PCN reaction that required hospitalization: No Has had a PCN reaction occurring within the last 10 years: Yes If all of the above  answers are "NO", then may proceed with Cephalosporin use. Updated 01/06/20; Reaction was low severity rash. Ancef given. No reaction        Medication List        Accurate as of August 30, 2021  1:45 PM. If you have any questions, ask your nurse or doctor.          HYDROmorphone 2 MG tablet Commonly known as: Dilaudid Take 1 tablet (2 mg total) by mouth every 12 (twelve) hours as needed for up to 5 days for severe pain.   ibuprofen 800 MG tablet Commonly known as: ADVIL Take 1 tablet (800 mg total) by mouth every 8 (eight) hours as needed. Take three times daily for 3 days. Then every 8 hours as needed.   ondansetron 4 MG disintegrating tablet Commonly known as: ZOFRAN-ODT Take 1 tablet (4 mg total) by mouth every 8 (eight) hours as needed for up to 5 days for nausea or vomiting.   PROBIOTIC PO Take 2 tablets by mouth daily.   Saxenda 18 MG/3ML Sopn Generic drug: Liraglutide -Weight Management Inject 1.2 mg into the skin daily. Inject 1.10m daily for 4 weeks. Then increase to 1.860mdaily   sulfamethoxazole-trimethoprim 800-160 MG tablet Commonly known as: BACTRIM DS Take 1 tablet by mouth 2 (two) times daily for 5 days.   tamsulosin 0.4 MG Caps capsule Commonly known as: FLOMAX Take 1 capsule (0.4 mg total) by mouth daily. Started by: SaDebroah LoopPA-C        Allergies:  Allergies  Allergen Reactions   Oxycodone Rash   Penicillins Rash    Has had a PCN reaction causing, facial/tongue/throat swelling, SOB or lightheadedness with hypotension: NO Has had a PCN reaction causing severe rash involving mucus membranes or skin necrosis: NO Has had a PCN reaction that required hospitalization: No Has had a PCN reaction occurring within the last 10 years: Yes If all of the above answers are "NO", then may proceed with Cephalosporin use. Updated 01/06/20; Reaction was low severity rash. Ancef given. No reaction     Family History: Family History  Problem  Relation Age of Onset   Cancer Mother        breast   Stroke Mother    Diabetes Father    Hypertension Father    Heart disease Maternal Grandfather    Dementia Paternal Grandmother    Bladder Cancer Neg Hx    Kidney cancer Neg Hx     Social History:   reports that she has never smoked. She has never used smokeless tobacco. She reports current alcohol use. She reports that she does not use drugs.  Physical Exam: BP (!) 145/83   Pulse 80   Ht 5' 5"  (1.651 m)   Wt (!) 320 lb (145.2 kg)   LMP 08/08/2021   BMI 53.25 kg/m   Constitutional:  Alert and oriented, no acute distress, nontoxic appearing HEENT: [place], AT Cardiovascular: No clubbing, cyanosis, or edema Respiratory: Normal respiratory effort, no increased work of breathing Skin: No rashes, bruises or suspicious lesions Neurologic: Grossly intact, no focal deficits, moving all 4 extremities  Psychiatric: Normal mood and affect  Laboratory Data: Results for orders placed or performed in visit on 08/30/21  Microscopic Examination   Urine  Result Value Ref Range   WBC, UA 0-5 0 - 5 /hpf   RBC, Urine 11-30 (A) 0 - 2 /hpf   Epithelial Cells (non renal) 0-10 0 - 10 /hpf   Bacteria, UA Few None seen/Few  Urinalysis, Complete  Result Value Ref Range   Specific Gravity, UA 1.025 1.005 - 1.030   pH, UA 5.5 5.0 - 7.5   Color, UA Yellow Yellow   Appearance Ur Clear Clear   Leukocytes,UA Negative Negative   Protein,UA 1+ (A) Negative/Trace   Glucose, UA Negative Negative   Ketones, UA Negative Negative   RBC, UA 2+ (A) Negative   Bilirubin, UA Negative Negative   Urobilinogen, Ur 0.2 0.2 - 1.0 mg/dL   Nitrite, UA Negative Negative   Microscopic Examination See below:    Pertinent Imaging: Results for orders placed during the hospital encounter of 08/27/21  CT Renal Stone Study  Narrative CLINICAL DATA:  Left flank pain for 20 minutes  EXAM: CT ABDOMEN AND PELVIS WITHOUT CONTRAST  TECHNIQUE: Multidetector CT imaging  of the abdomen and pelvis was performed following the standard protocol without IV contrast.  RADIATION DOSE REDUCTION: This exam was performed according to the departmental dose-optimization program which includes automated exposure control, adjustment of the mA and/or kV according to patient size and/or use of iterative reconstruction technique.  COMPARISON:  01/20/2020  FINDINGS: Lower chest: Included lung bases are clear.  Heart size is normal.  Hepatobiliary: No focal liver abnormality is seen. Status post cholecystectomy. No biliary dilatation.  Pancreas: Unremarkable. No pancreatic ductal dilatation or surrounding inflammatory changes.  Spleen: Normal in size without focal abnormality.  Adrenals/Urinary Tract: Unremarkable adrenal glands. 10 x 9 mm stone within the left renal pelvis. Additional punctate stone within the lower pole of the left kidney. There are 3 nonobstructing stones within the right kidney measuring up to 5 mm in diameter. No hydronephrosis of either kidney. Bilateral ureters are unremarkable. No ureteral calculi identified. Urinary bladder is decompressed.  Stomach/Bowel: Stomach is within normal limits. Appendix appears normal (series 2, image 71). No evidence of bowel wall thickening, distention, or inflammatory changes.  Vascular/Lymphatic: No significant vascular finding on noncontrast imaging. There are a few scattered mildly prominent, although non-pathologically enlarged, mesenteric lymph nodes. No abdominopelvic lymphadenopathy by size criteria.  Reproductive: Uterus and bilateral adnexa are unremarkable.  Other: No free fluid. No abdominopelvic fluid collection. No pneumoperitoneum. No abdominal wall hernia.  Musculoskeletal: No acute or significant osseous findings.  IMPRESSION: Bilateral nonobstructing nephrolithiasis including a 10 x 9 mm stone within the left renal pelvis. No hydronephrosis.   Electronically Signed By: Davina Poke D.O. On: 08/27/2021 11:26  I personally reviewed the images referenced above and bilateral nonobstructing nephrolithiasis with a 1 cm left renal pelvic stone.  Assessment & Plan:   1. Left renal stone Left flank plain likely secondary to a ball valving 1 cm left renal pelvic stone.  UA today notable for microscopic hematuria only on empiric Bactrim, though her prior UA in the ED was contaminated and likely did not represent true infection.  We discussed various treatment options for her stones including ESWL vs. left ureteroscopy vs bilateral ureteroscopy with laser lithotripsy and stent. We discussed the risks and benefits of all procedures including bleeding, infection, and damage to surrounding structures.  We specifically discussed that ESWL is a less  invasive procedure that requires less anesthesia, however patients have to pass their residual stone fragments, which may take several weeks and be associated with continued renal colic. Additionally, we discussed the limitations of ESWL including the ability to only treat one stone in a single treatment and the low, 10-20% chance of treatment failure requiring repeat ESWL versus ureteroscopy in the future. By comparison, ureteroscopy is a more invasive surgical procedure that requires more anesthesia, but we can potentially treat multiple stones in a single procedure. However, URS does require placement of a ureteral stent, which will remain in place for approximately 3-10 days and can be associated with flank pain, bladder pain, dysuria, urgency, frequency, urinary leakage, and gross hematuria.  Based on this conversation, she would like to proceed with left ureteroscopy with laser lithotripsy and stent placement.   Patient to complete prescribed Bactrim.  Starting Flomax.  Okay to take Zofran and Dilaudid as prescribed in the ED.  I offered her IM Toradol in clinic today, which she accepted.  She was having some persistent pain after her  first dose of 15 mg and a second dose was administered for a total of 30 mg today.  OR orders placed.  We discussed return precautions including fever, chills, uncontrollable nausea/vomiting, and uncontrollable pain. - Urinalysis, Complete - CULTURE, URINE COMPREHENSIVE - tamsulosin (FLOMAX) 0.4 MG CAPS capsule; Take 1 capsule (0.4 mg total) by mouth daily.  Dispense: 30 capsule; Refill: 0 - ketorolac (TORADOL) injection 15 mg - ketorolac (TORADOL) injection 15 mg  Return in 1 week (on 09/06/2021) for Left ureteroscopy with laser lithotripsy and stent placement with Dr. Bernardo Heater.  Debroah Loop, PA-C  [facility] [address], [place], [place] [address] [phone]

## 2021-08-30 NOTE — Progress Notes (Signed)
Surgical Physician Order Form [facility]  Dr. Bernardo Heater * Scheduling expectation :  09/06/2021  *Length of Case:   *Clearance needed: no  *Anticoagulation Instructions: N/A  *Aspirin Instructions: N/A  *Post-op visit Date/Instructions:  1 month with RUS prior  *Diagnosis: Left Nephrolithiasis  *Procedure: left Ureteroscopy w/laser lithotripsy & stent placement (16606)   Additional orders: N/A  -Admit type: OUTpatient  -Anesthesia: General  -VTE Prophylaxis Standing Order SCD's       Other:   -Standing Lab Orders Per Anesthesia    Lab other: None  -Standing Test orders EKG/Chest x-ray per Anesthesia       Test other:   - Medications:  Ancef 2gm IV  -Other orders:  N/A

## 2021-08-31 MED ORDER — SAXENDA 18 MG/3ML ~~LOC~~ SOPN
1.2000 mg | PEN_INJECTOR | Freq: Every day | SUBCUTANEOUS | 0 refills | Status: DC
Start: 2021-08-31 — End: 2021-10-31

## 2021-09-01 ENCOUNTER — Other Ambulatory Visit: Payer: Self-pay | Admitting: Physician Assistant

## 2021-09-01 ENCOUNTER — Telehealth: Payer: Self-pay

## 2021-09-01 DIAGNOSIS — N200 Calculus of kidney: Secondary | ICD-10-CM

## 2021-09-01 MED ORDER — HYDROMORPHONE HCL 2 MG PO TABS: 2.0000 mg | ORAL_TABLET | Freq: Four times a day (QID) | ORAL | 0 refills | Status: DC | PRN

## 2021-09-01 NOTE — Telephone Encounter (Signed)
Sent; please notify patient.

## 2021-09-01 NOTE — Telephone Encounter (Signed)
Spoke with patient and advised results   

## 2021-09-01 NOTE — Telephone Encounter (Addendum)
Patient called asking for refill on Dilaudid sent to [facility]

## 2021-09-02 ENCOUNTER — Encounter
Admission: RE | Admit: 2021-09-02 | Discharge: 2021-09-02 | Disposition: A | Discharge status: Home / Self Care | Payer: BC Managed Care – PPO | Source: Ambulatory Visit | Attending: Urology | Admitting: Urology

## 2021-09-02 ENCOUNTER — Encounter: Payer: Self-pay | Admitting: Urology

## 2021-09-02 DIAGNOSIS — Z01818 Encounter for other preprocedural examination: Secondary | ICD-10-CM

## 2021-09-02 HISTORY — DX: Ventral hernia without obstruction or gangrene: K43.9

## 2021-09-02 LAB — CULTURE, URINE COMPREHENSIVE

## 2021-09-02 NOTE — Patient Instructions (Addendum)
Your procedure is scheduled on:09-06-21 Tuesday Report to the Registration Desk on the 1st floor of the [facility].Then proceed to the 2nd floor Surgery Desk To find out your arrival time, please call [phone] between 1PM - 3PM on:09-05-21 Monday If your arrival time is 6:00 am, do not arrive prior to that time as the [place] entrance doors do not open until 6:00 am.  REMEMBER: Instructions that are not followed completely may result in serious medical risk, up to and including death; or upon the discretion of your surgeon and anesthesiologist your surgery may need to be rescheduled.  Do not eat food OR drink any liquids after midnight the night before surgery.  No gum chewing, lozengers or hard candies.  TAKE THESE MEDICATIONS THE MORNING OF SURGERY WITH A SIP OF WATER: -tamsulosin (FLOMAX)  -You may take HYDROmorphone (DILAUDID) if needed for pain  Stop your Liraglutide -Weight Management (SAXENDA) NOW (09-02-21)  One week prior to surgery: Stop Anti-inflammatories (NSAIDS) such as Advil, Aleve, Ibuprofen, Motrin, Naproxen, Naprosyn and Aspirin based products such as Excedrin, Goodys Powder, BC Powder.You may however, take Tylenol/Dilaudid if needed for pain up until the day of surgery.  Stop ANY OVER THE COUNTER supplements/vitamins NOW (09-02-21) until after surgery.  No Alcohol for 24 hours before or after surgery.  No Smoking including e-cigarettes for 24 hours prior to surgery.  No chewable tobacco products for at least 6 hours prior to surgery.  No nicotine patches on the day of surgery.  Do not use any "recreational" drugs for at least a week prior to your surgery.  Please be advised that the combination of cocaine and anesthesia may have negative outcomes, up to and including death. If you test positive for cocaine, your surgery will be cancelled.  On the morning of surgery brush your teeth with toothpaste and water, you may rinse your mouth with mouthwash if you  wish. Do not swallow any toothpaste or mouthwash.  Do not wear jewelry, make-up, hairpins, clips or nail polish.  Do not wear lotions, powders, or perfumes.   Do not shave body from the neck down 48 hours prior to surgery just in case you cut yourself which could leave a site for infection.  Also, freshly shaved skin may become irritated if using the CHG soap.  Contact lenses, hearing aids and dentures may not be worn into surgery.  Do not bring valuables to the hospital. [facility] is not responsible for any missing/lost belongings or valuables.   Notify your doctor if there is any change in your medical condition (cold, fever, infection).  Wear comfortable clothing (specific to your surgery type) to the hospital.  After surgery, you can help prevent lung complications by doing breathing exercises.  Take deep breaths and cough every 1-2 hours. Your doctor may order a device called an Incentive Spirometer to help you take deep breaths. When coughing or sneezing, hold a pillow firmly against your incision with both hands. This is called "splinting." Doing this helps protect your incision. It also decreases belly discomfort.  If you are being admitted to the hospital overnight, leave your suitcase in the car. After surgery it may be brought to your room.  If you are being discharged the day of surgery, you will not be allowed to drive home. You will need a responsible adult (18 years or older) to drive you home and stay with you that night.   If you are taking public transportation, you will need to have a  responsible adult (18 years or older) with you. Please confirm with your physician that it is acceptable to use public transportation.   Please call the [facility] Dept. at [phone] if you have any questions about these instructions.  Surgery Visitation Policy:  Patients undergoing a surgery or procedure may have two family members or support persons with them as  long as the person is not COVID-19 positive or experiencing its symptoms.

## 2021-09-05 NOTE — Progress Notes (Signed)
  Perioperative Services Pre-Admission/Anesthesia Testing    Date: 09/05/21  Name: Diana Sexton MRN:   [id number]  Re: Plans for surgery (factor II gene mutation)  Planned Surgical Procedure(s):    Case: [id number] Date/Time: 09/06/21 0835   Procedure: CYSTOSCOPY/URETEROSCOPY/HOLMIUM LASER/STENT PLACEMENT (Left)   Anesthesia type: General   Pre-op diagnosis: Left Nephrolithiasis   Location: [facility] OR ROOM 10 / [facility]   Surgeons: Abbie Sons, MD   Clinical Notes:  Patient is scheduled for the above procedure on 09/06/2021 with Dr. John Giovanni, MD.  In preparation for her procedure, during patient's PAT interview, it was determined that she was not taking her prescribed NOAC medication.  Patient was referred to me for further review prior to upcoming procedure.  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 She has an associated history of DVT x 3. She is followed by hematology [name], MD), with her last visit being over a year ago. She was advised then of the need for lifelong treatment with a NOAC. She has been on warfarin in the past, however she was noncompliant with the lab monitoring, therefore she was transitioned to apixaban.  As previously mentioned, we learned during her PAT interview that she has self discontinued her prescribed apixaban.  Of note, she told her hematologist during her last visit that she did not need refills, and that she would obtain refill Rx when needed from her PCP. Patient stated to PAT nurse today that she "only goes and sees Dr. [name] when she gets a clot".  Case discussed with attending anesthesiologist on call Rosey Bath, MD).  Given the low risk of her procedure, there is nothing that would preclude this patient from proceeding. I was asked to involve patient's primary medical care provider to solicit their thoughts on proceeding vs. seeing hematology prior to the  procedure. Communicated with Mathis Dad, NP-C and discussed case in brief detail. We seem to be on the same page regarding this patient. Sending her to hematology will result in them telling her what she already knows, which is that she is at increased risk for VTE and that she needs to be taking her apixaban as prescribed. PCP with plans speak with patient about risks of VTE development given her factor II gene mutation. If willing, patient will be restarted on apixaban with plans for lifelong therapy as per recommendations from hematology.   Honor Loh, MSN, APRN, FNP-C, CEN [facility]  Peri-operative Services Nurse Practitioner Phone: [phone] 09/05/21 9:40 AM  NOTE: This note has been prepared using Dragon dictation software. Despite my best ability to proofread, there is always the potential that unintentional transcriptional errors may still occur from this process.

## 2021-09-06 ENCOUNTER — Ambulatory Visit: Payer: BC Managed Care – PPO | Admitting: Urgent Care

## 2021-09-06 ENCOUNTER — Encounter
Admission: RE | Disposition: A | Discharge status: Home / Self Care | Payer: Self-pay | Source: Home / Self Care | Attending: Urology

## 2021-09-06 ENCOUNTER — Ambulatory Visit: Payer: BC Managed Care – PPO

## 2021-09-06 ENCOUNTER — Encounter: Payer: Self-pay | Admitting: Urology

## 2021-09-06 ENCOUNTER — Ambulatory Visit
Admission: RE | Admit: 2021-09-06 | Discharge: 2021-09-06 | Disposition: A | Discharge status: Home / Self Care | Payer: BC Managed Care – PPO | Attending: Urology | Admitting: Urology

## 2021-09-06 ENCOUNTER — Other Ambulatory Visit: Payer: Self-pay

## 2021-09-06 DIAGNOSIS — Z6841 Body Mass Index (BMI) 40.0 and over, adult: Secondary | ICD-10-CM | POA: Diagnosis not present

## 2021-09-06 DIAGNOSIS — N200 Calculus of kidney: Secondary | ICD-10-CM | POA: Diagnosis not present

## 2021-09-06 DIAGNOSIS — Z86718 Personal history of other venous thrombosis and embolism: Secondary | ICD-10-CM | POA: Insufficient documentation

## 2021-09-06 DIAGNOSIS — Z01818 Encounter for other preprocedural examination: Secondary | ICD-10-CM

## 2021-09-06 HISTORY — DX: Patient's noncompliance with other medical treatment and regimen due to unspecified reason: Z91.199

## 2021-09-06 HISTORY — PX: CYSTOSCOPY/URETEROSCOPY/HOLMIUM LASER/STENT PLACEMENT: SHX6546

## 2021-09-06 LAB — POCT PREGNANCY, URINE: Preg Test, Ur: NEGATIVE

## 2021-09-06 SURGERY — CYSTOSCOPY/URETEROSCOPY/HOLMIUM LASER/STENT PLACEMENT
Anesthesia: General | Site: Ureter | Laterality: Left

## 2021-09-06 MED ORDER — FENTANYL CITRATE (PF) 100 MCG/2ML IJ SOLN
INTRAMUSCULAR | Status: AC
  Filled 2021-09-06: qty 2

## 2021-09-06 MED ORDER — SUGAMMADEX SODIUM 500 MG/5ML IV SOLN
INTRAVENOUS | Status: DC | PRN
  Administered 2021-09-06: 400 mg via INTRAVENOUS

## 2021-09-06 MED ORDER — MIDAZOLAM HCL 2 MG/2ML IJ SOLN
INTRAMUSCULAR | Status: AC
  Filled 2021-09-06: qty 2

## 2021-09-06 MED ORDER — GLYCOPYRROLATE 0.2 MG/ML IJ SOLN
INTRAMUSCULAR | Status: DC | PRN
  Administered 2021-09-06: .2 mg via INTRAVENOUS

## 2021-09-06 MED ORDER — MIDAZOLAM HCL 2 MG/2ML IJ SOLN
INTRAMUSCULAR | Status: DC | PRN
  Administered 2021-09-06: 2 mg via INTRAVENOUS

## 2021-09-06 MED ORDER — OXYBUTYNIN CHLORIDE 5 MG PO TABS: 5.0000 mg | ORAL_TABLET | Freq: Once | ORAL | Status: AC

## 2021-09-06 MED ORDER — ACETAMINOPHEN 10 MG/ML IV SOLN: 1000.0000 mg | Freq: Once | INTRAVENOUS | Status: DC | PRN

## 2021-09-06 MED ORDER — FENTANYL CITRATE (PF) 100 MCG/2ML IJ SOLN
INTRAMUSCULAR | Status: AC
  Administered 2021-09-06: 25 ug via INTRAVENOUS
  Filled 2021-09-06: qty 2

## 2021-09-06 MED ORDER — FENTANYL CITRATE (PF) 100 MCG/2ML IJ SOLN
25.0000 ug | INTRAMUSCULAR | Status: AC | PRN
  Administered 2021-09-06 (×4): 25 ug via INTRAVENOUS

## 2021-09-06 MED ORDER — ONDANSETRON HCL 4 MG/2ML IJ SOLN
INTRAMUSCULAR | Status: DC | PRN
  Administered 2021-09-06 (×2): 4 mg via INTRAVENOUS

## 2021-09-06 MED ORDER — FAMOTIDINE 20 MG PO TABS
ORAL_TABLET | ORAL | Status: AC
  Administered 2021-09-06: 20 mg via ORAL
  Filled 2021-09-06: qty 1

## 2021-09-06 MED ORDER — FENTANYL CITRATE (PF) 100 MCG/2ML IJ SOLN
INTRAMUSCULAR | Status: DC | PRN
  Administered 2021-09-06: 50 ug via INTRAVENOUS

## 2021-09-06 MED ORDER — ACETAMINOPHEN 500 MG PO TABS: 1000.0000 mg | ORAL_TABLET | Freq: Once | ORAL | Status: DC

## 2021-09-06 MED ORDER — OXYBUTYNIN CHLORIDE 5 MG PO TABS: ORAL_TABLET | ORAL | 0 refills | Status: DC

## 2021-09-06 MED ORDER — CHLORHEXIDINE GLUCONATE 0.12 % MT SOLN: 15.0000 mL | Freq: Once | OROMUCOSAL | Status: AC

## 2021-09-06 MED ORDER — OXYBUTYNIN CHLORIDE 5 MG PO TABS
ORAL_TABLET | ORAL | Status: AC
  Administered 2021-09-06: 5 mg via ORAL
  Filled 2021-09-06: qty 1

## 2021-09-06 MED ORDER — LACTATED RINGERS IV SOLN: INTRAVENOUS | Status: DC

## 2021-09-06 MED ORDER — ACETAMINOPHEN 10 MG/ML IV SOLN
INTRAVENOUS | Status: AC
  Filled 2021-09-06: qty 100

## 2021-09-06 MED ORDER — CEFAZOLIN SODIUM-DEXTROSE 2-4 GM/100ML-% IV SOLN
2.0000 g | INTRAVENOUS | Status: AC
  Administered 2021-09-06: 3 g via INTRAVENOUS

## 2021-09-06 MED ORDER — ONDANSETRON HCL 4 MG/2ML IJ SOLN: 4.0000 mg | Freq: Once | INTRAMUSCULAR | Status: DC | PRN

## 2021-09-06 MED ORDER — ACETAMINOPHEN 10 MG/ML IV SOLN
INTRAVENOUS | Status: DC | PRN
  Administered 2021-09-06: 1000 mg via INTRAVENOUS

## 2021-09-06 MED ORDER — CHLORHEXIDINE GLUCONATE 0.12 % MT SOLN
OROMUCOSAL | Status: AC
  Administered 2021-09-06: 15 mL via OROMUCOSAL
  Filled 2021-09-06: qty 15

## 2021-09-06 MED ORDER — ROCURONIUM BROMIDE 100 MG/10ML IV SOLN
INTRAVENOUS | Status: DC | PRN
  Administered 2021-09-06: 10 mg via INTRAVENOUS
  Administered 2021-09-06: 60 mg via INTRAVENOUS

## 2021-09-06 MED ORDER — SUCCINYLCHOLINE CHLORIDE 200 MG/10ML IV SOSY
PREFILLED_SYRINGE | INTRAVENOUS | Status: DC | PRN
  Administered 2021-09-06: 120 mg via INTRAVENOUS

## 2021-09-06 MED ORDER — FAMOTIDINE 20 MG PO TABS: 20.0000 mg | ORAL_TABLET | Freq: Once | ORAL | Status: AC

## 2021-09-06 MED ORDER — CEFAZOLIN SODIUM-DEXTROSE 1-4 GM/50ML-% IV SOLN
INTRAVENOUS | Status: AC
  Filled 2021-09-06: qty 50

## 2021-09-06 MED ORDER — DEXAMETHASONE SODIUM PHOSPHATE 10 MG/ML IJ SOLN
INTRAMUSCULAR | Status: DC | PRN
  Administered 2021-09-06: 10 mg via INTRAVENOUS

## 2021-09-06 MED ORDER — LIDOCAINE HCL (CARDIAC) PF 100 MG/5ML IV SOSY
PREFILLED_SYRINGE | INTRAVENOUS | Status: DC | PRN
  Administered 2021-09-06: 100 mg via INTRAVENOUS

## 2021-09-06 MED ORDER — CEFAZOLIN SODIUM-DEXTROSE 2-4 GM/100ML-% IV SOLN
INTRAVENOUS | Status: AC
  Filled 2021-09-06: qty 100

## 2021-09-06 MED ORDER — SODIUM CHLORIDE 0.9 % IR SOLN
Status: DC | PRN
  Administered 2021-09-06: 3000 mL via INTRAVESICAL

## 2021-09-06 MED ORDER — PROPOFOL 10 MG/ML IV BOLUS
INTRAVENOUS | Status: DC | PRN
  Administered 2021-09-06: 200 mg via INTRAVENOUS

## 2021-09-06 MED ORDER — IOHEXOL 180 MG/ML  SOLN
INTRAMUSCULAR | Status: DC | PRN
  Administered 2021-09-06: 20 mL

## 2021-09-06 MED ORDER — ORAL CARE MOUTH RINSE: 15.0000 mL | Freq: Once | OROMUCOSAL | Status: AC

## 2021-09-06 SURGICAL SUPPLY — 28 items
Amplatz super stiff straight tip guide wire ×1 IMPLANT
BAG DRAIN CYSTO-URO LG1000N (MISCELLANEOUS) ×2 IMPLANT
BASKET ZERO TIP 1.9FR (BASKET) ×1 IMPLANT
BRUSH SCRUB EZ 1% IODOPHOR (MISCELLANEOUS) ×2 IMPLANT
CATH URET FLEX-TIP 2 LUMEN 10F (CATHETERS) ×1 IMPLANT
CATH URETL OPEN END 6X70 (CATHETERS) IMPLANT
CNTNR SPEC 2.5X3XGRAD LEK (MISCELLANEOUS)
CONT SPEC 4OZ STER OR WHT (MISCELLANEOUS)
CONTAINER SPEC 2.5X3XGRAD LEK (MISCELLANEOUS) IMPLANT
DRAPE UTILITY 15X26 TOWEL STRL (DRAPES) ×2 IMPLANT
GLOVE SURG UNDER POLY LF SZ7.5 (GLOVE) ×2 IMPLANT
GOWN STRL REUS W/ TWL LRG LVL3 (GOWN DISPOSABLE) ×1 IMPLANT
GOWN STRL REUS W/ TWL XL LVL3 (GOWN DISPOSABLE) ×1 IMPLANT
GOWN STRL REUS W/TWL LRG LVL3 (GOWN DISPOSABLE) ×1
GOWN STRL REUS W/TWL XL LVL3 (GOWN DISPOSABLE) ×1
GUIDEWIRE STR DUAL SENSOR (WIRE) ×2 IMPLANT
GUIDEWIRE SUPER STIFF .035X180 (WIRE) ×1 IMPLANT
IV NS IRRIG 3000ML ARTHROMATIC (IV SOLUTION) ×2 IMPLANT
KIT TURNOVER CYSTO (KITS) ×2 IMPLANT
PACK CYSTO AR (MISCELLANEOUS) ×2 IMPLANT
SET CYSTO W/LG BORE CLAMP LF (SET/KITS/TRAYS/PACK) ×2 IMPLANT
SHEATH NAVIGATOR HD 12/14X36 (SHEATH) ×1 IMPLANT
STENT URET 6FRX24 CONTOUR (STENTS) ×1 IMPLANT
STENT URET 6FRX26 CONTOUR (STENTS) IMPLANT
SURGILUBE 2OZ TUBE FLIPTOP (MISCELLANEOUS) ×2 IMPLANT
TRACTIP FLEXIVA PULSE ID 200 (Laser) ×2 IMPLANT
VALVE UROSEAL ADJ ENDO (VALVE) IMPLANT
WATER STERILE IRR 500ML POUR (IV SOLUTION) ×2 IMPLANT

## 2021-09-06 NOTE — Op Note (Signed)
Preoperative diagnosis: Left nephrolithiasis   Postoperative diagnosis: Left nephrolithiasis  Procedure:  Cystoscopy Left ureteroscopy and stone removal Ureteroscopic laser lithotripsy Left ureteral stent placement (59F/24 cm) Left retrograde pyelography with interpretation Intraoperative fluoroscopy < 30 minutes  Surgeon: Nicki Reaper C. Maurine Mowbray, M.D.  Anesthesia: General  Complications: None  Intraoperative findings:  Cystoscopy-bladder mucosa without erythema, solid or papillary lesions.  UOs normal-appearing bilaterally. Ureteropyeloscopy-10 mm renal pelvic calculus.  Multiple calyceal Randall's plaques Retrograde pyelography post procedure showed no filling defects, stone fragments or contrast extravasation  EBL: Minimal  Specimens: None   Indication: Diana Sexton is a 36 y.o. female with a 10 mm left renal calculus with intermittent renal colic. After reviewing the management options for treatment, the patient elected to proceed with the above surgical procedure(s). We have discussed the potential benefits and risks of the procedure, side effects of the proposed treatment, the likelihood of the patient achieving the goals of the procedure, and any potential problems that might occur during the procedure or recuperation. Informed consent has been obtained.  Description of procedure:  The patient was taken to the operating room and general anesthesia was induced.  The patient was placed in the dorsal lithotomy position, prepped and draped in the usual sterile fashion, and preoperative antibiotics were administered. A preoperative time-out was performed.   A 21 French cystoscope was lubricated and passed per urethra.  Panendoscopy was performed with findings as described above.  Attention was directed to the left ureteral orifice and a 0.038 Sensor wire was then advanced up the ureter into the renal pelvis under fluoroscopic guidance.  The cystoscope was removed and a dual-lumen  catheter was placed over the Sensor wire.  Retrograde pyelogram was performed and the calculus was difficult to identify on fluoroscopy.  A Super Stiff wire was then placed through the dual-lumen catheter followed by catheter removal  A 12/14 French ureteral access sheath was placed over the working wire under fluoroscopic guidance without difficulty.  A dual channel digital flexible ureteroscope was placed through the access sheath and advanced into the renal pelvis without difficulty.  The calculus was identified as described above.  A 242 m holmium laser fiber was placed through the ureteroscope and the stone was dusted/fragmented at a setting of 0.3J/80 Hz.  Due to stone hardness several fragments chipped off the main calculus  All stone fragments were then removed from the collecting system with a zero tip nitinol basket.  Retrograde pyelogram was performed and each calyx was sequentially examined under fluoroscopic guidance and no significant size fragments were identified.  The ureteral access sheath and ureteroscope were removed in tandem and the ureter showed no evidence of injury or perforation.  A 59F/24 cm Contour ureteral stent was was placed under fluoroscopic guidance.  The wire was then removed with an adequate stent curl noted in the renal pelvis as well as in the bladder.  The bladder was then emptied and the procedure ended.  The patient appeared to tolerate the procedure well and without complications.  After anesthetic reversal the patient was transported to the PACU in stable condition.    Plan: Follow-up office visit for cystoscopy with stent removal ~ 1 week   John Giovanni, MD

## 2021-09-06 NOTE — Interval H&P Note (Signed)
History and Physical Interval Note:  CV:RRR Lungs:clear  09/06/2021 8:41 AM  Diana Sexton  has presented today for surgery, with the diagnosis of Left Nephrolithiasis.  The various methods of treatment have been discussed with the patient and family. After consideration of risks, benefits and other options for treatment, the patient has consented to  Procedure(s): CYSTOSCOPY/URETEROSCOPY/HOLMIUM LASER/STENT PLACEMENT (Left) as a surgical intervention.  The patient's history has been reviewed, patient examined, no change in status, stable for surgery.  I have reviewed the patient's chart and labs.  Questions were answered to the patient's satisfaction.     [place]

## 2021-09-06 NOTE — Transfer of Care (Signed)
Immediate Anesthesia Transfer of Care Note  Patient: [name]  Procedure(s) Performed: CYSTOSCOPY/URETEROSCOPY/HOLMIUM LASER/STENT PLACEMENT (Left: Ureter)  Patient Location: PACU  Anesthesia Type:General  Level of Consciousness: awake, drowsy and patient cooperative  Airway & Oxygen Therapy: Patient Spontanous Breathing and Patient connected to face mask oxygen  Post-op Assessment: Report given to RN and Post -op Vital signs reviewed and stable  Post vital signs: Reviewed and stable  Last Vitals:  Vitals Value Taken Time  BP 159/85 09/06/21 1020  Temp 36.4 C 09/06/21 1020  Pulse 88 09/06/21 1025  Resp 18 09/06/21 1025  SpO2 100 % 09/06/21 1025  Vitals shown include unvalidated device data.  Last Pain:  Vitals:   09/06/21 1020  TempSrc:   PainSc: Asleep         Complications: No notable events documented.

## 2021-09-06 NOTE — Anesthesia Postprocedure Evaluation (Signed)
Anesthesia Post Note  Patient: Diana Sexton  Procedure(s) Performed: CYSTOSCOPY/URETEROSCOPY/HOLMIUM LASER/STENT PLACEMENT (Left: Ureter)  Patient location during evaluation: PACU Anesthesia Type: General Level of consciousness: awake and alert Pain management: pain level controlled Vital Signs Assessment: post-procedure vital signs reviewed and stable Respiratory status: spontaneous breathing, nonlabored ventilation, respiratory function stable and patient connected to nasal cannula oxygen Cardiovascular status: blood pressure returned to baseline and stable Postop Assessment: no apparent nausea or vomiting Anesthetic complications: no   No notable events documented.   Last Vitals:  Vitals:   09/06/21 1020 09/06/21 1030  BP: (!) 159/85 136/76  Pulse: 92 86  Resp: 17 20  Temp: 36.4 C   SpO2: 99% 100%    Last Pain:  Vitals:   09/06/21 1020  TempSrc:   PainSc: Asleep                 Arita Miss

## 2021-09-06 NOTE — Anesthesia Procedure Notes (Addendum)
Procedure Name: Intubation Date/Time: 09/06/2021 9:03 AM  Performed by: Kelton Pillar, CRNAPre-anesthesia Checklist: Patient identified, Emergency Drugs available, Suction available and Patient being monitored Patient Re-evaluated:Patient Re-evaluated prior to induction Oxygen Delivery Method: Circle system utilized Preoxygenation: Pre-oxygenation with 100% oxygen Induction Type: IV induction Ventilation: Mask ventilation without difficulty Laryngoscope Size: McGraph and 3 Grade View: Grade I Tube type: Oral Tube size: 7.0 mm Number of attempts: 1 Airway Equipment and Method: Stylet and Oral airway Placement Confirmation: ETT inserted through vocal cords under direct vision, positive ETCO2, breath sounds checked- equal and bilateral and CO2 detector Secured at: 21 cm Tube secured with: Tape Dental Injury: Teeth and Oropharynx as per pre-operative assessment

## 2021-09-06 NOTE — Anesthesia Preprocedure Evaluation (Addendum)
Anesthesia Evaluation  Patient identified by MRN, date of birth, ID band Patient awake    Reviewed: Allergy & Precautions, H&P , NPO status , Patient's Chart, lab work & pertinent test results, reviewed documented beta blocker date and time   History of Anesthesia Complications Negative for: history of anesthetic complications  Airway Mallampati: III  TM Distance: >3 FB Neck ROM: full    Dental  (+) Teeth Intact   Pulmonary neg pulmonary ROS, neg sleep apnea, neg COPD, Patient abstained from smoking.Not current smoker,    Pulmonary exam normal breath sounds clear to auscultation       Cardiovascular Exercise Tolerance: Good METS(-) hypertension+ DVT  (-) CAD and (-) Past MI Normal cardiovascular exam(-) dysrhythmias  Rhythm:regular Rate:Normal  History of genetic mutation predisposing to blood clots. Patient does not follow with heme and self d/c'd anticoagulation. She says the anticoagulants give her a headache and she can't tolerate them.    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  Past Medical History: No date: Anemia No date: Anxiety 2012: Cellulitis     Comment:  caused stillbirth 06/2011: DVT (deep venous thrombosis) ([place])     Comment:  under right clavicle No date: Dysmenorrhea 11/2013: Family history of breast cancer     Comment:  BRCA/MyRisk neg No date: H/O blood clots No date: Headache     Comment:  related to hormonal meds No date: History of kidney stones No date: History of prothrombin mutation 11/2013: Increased risk of breast cancer     Comment:  IBIS=27% No  date: Obesity Past Surgical History: 2007: CHOLECYSTECTOMY 02/02/2017: CYSTOSCOPY W/ RETROGRADES; Left     Comment:  Procedure: CYSTOSCOPY WITH RETROGRADE PYELOGRAM;                Surgeon: Abbie Sons, MD;  Location: [facility];                Service: Urology;  Laterality: Left; 06/26/2019: CYSTOSCOPY W/ RETROGRADES; Right     Comment:  Procedure: CYSTOSCOPY WITH RETROGRADE PYELOGRAM;                Surgeon: Abbie Sons, MD;  Location: [facility];                Service: Urology;  Laterality: Right; 02/02/2017: CYSTOSCOPY/URETEROSCOPY/HOLMIUM LASER/STENT PLACEMENT;  Left     Comment:  Procedure: CYSTOSCOPY/URETEROSCOPY/HOLMIUM LASER/STENT               PLACEMENT;  Surgeon: Abbie Sons, MD;  Location:               [facility];  Service: Urology;  Laterality: Left; 06/26/2019: CYSTOSCOPY/URETEROSCOPY/HOLMIUM LASER/STENT PLACEMENT;  Right     Comment:  Procedure: CYSTOSCOPY/URETEROSCOPY/HOLMIUM LASER/STENT               PLACEMENT;  Surgeon: Abbie Sons, MD;  Location:               [facility];  Service: Urology;  Laterality: Right; No date: DILITATION & CURRETTAGE/HYSTROSCOPY WITH ESSURE 12/24/2018: DILITATION & CURRETTAGE/HYSTROSCOPY WITH NOVASURE  ABLATION; N/A     Comment:  Procedure: DILATATION & CURETTAGE/HYSTEROSCOPY WITH  ENDOMETRIAL ABLATION ([place]);  Surgeon: Will Bonnet, MD;  Location: [facility];  Service: Gynecology;              Laterality: N/A; No date: INTRAUTERINE DEVICE (IUD) INSERTION No date: LITHOTRIPSY No date: TONSILLECTOMY No date: TONSILLECTOMY AND ADENOIDECTOMY 2014: TUBAL LIGATION 01/06/2020: XI ROBOTIC ASSISTED VENTRAL HERNIA; N/A     Comment:  Procedure: XI ROBOTIC ASSISTED VENTRAL HERNIA;  Surgeon:              Olean Ree, MD;  Location: [facility];  Service:               General;  Laterality: N/A; BMI    Body Mass Index: 49.89 kg/m     Reproductive/Obstetrics negative OB ROS                            Anesthesia Physical  Anesthesia Plan  ASA: 3  Anesthesia Plan: General   Post-op Pain Management: Ofirmev IV (intra-op)* and Toradol IV (intra-op)*   Induction: Intravenous and Rapid sequence  PONV Risk Score and Plan: 3 and Ondansetron, Dexamethasone and Midazolam  Airway Management Planned: Oral ETT and Video Laryngoscope Planned  Additional Equipment: None  Intra-op Plan:   Post-operative Plan: Extubation in OR  Informed Consent: I have reviewed the patients History and Physical, chart, labs and discussed the procedure including the risks, benefits and alternatives for the proposed anesthesia with the patient or authorized representative who has indicated his/her understanding and acceptance.     Dental Advisory Given  Plan Discussed with: CRNA  Anesthesia Plan Comments: (I, and the patient's primary care provider, both independently came to the agreement that having this patient see hematology beforehand would offer little benefit; they would likely say she is at increased risk of VTE and should be on anticoagulation, which we already know. Discussed risks of anesthesia with patient, including PONV, sore throat, lip/dental/eye damage. Rare risks discussed as well, such as VTE, cardiorespiratory and neurological sequelae, and allergic reactions. Discussed the role of CRNA in patient's perioperative care. Patient understands. Patient informed about increased incidence of above perioperative risk due to high BMI. Patient understands. )       Anesthesia Quick Evaluation

## 2021-09-06 NOTE — Discharge Instructions (Addendum)
DISCHARGE INSTRUCTIONS FOR KIDNEY STONE/URETERAL STENT   MEDICATIONS:  1. Resume all your other meds from home.  2.  AZO (over-the-counter) can help with the burning/stinging when you urinate. 3.  Rx oxybutynin for stent/bladder irritation was sent to pharmacy 4.  Tamsulosin can help with stent irritation  ACTIVITY:  1. May resume regular activities in 24 hours. 2. No driving while on narcotic pain medications  3. Drink plenty of water  4. Continue to walk at home - you can still get blood clots when you are at home, so keep active, but don't over do it.  5. May return to work/school tomorrow or when you feel ready    SIGNS/SYMPTOMS TO CALL:  Common postoperative symptoms include urinary frequency, urgency, bladder spasm and blood in the urine  Please call us if you have a fever greater than 101.5, uncontrolled nausea/vomiting, uncontrolled pain, dizziness, unable to urinate, excessively bloody urine, chest pain, shortness of breath, leg swelling, leg pain, or any other concerns or questions.   You can reach [place] at [phone].   FOLLOW-UP:  1. You will be contacted for an appointment for stent removal next week  [place]   The drugs that you were given will stay in your system until tomorrow so for the next 24 hours you should not:  Drive an automobile Make any legal decisions Drink any alcoholic beverage   You may resume regular meals tomorrow.  Today it is better to start with liquids and gradually work up to solid foods.  You may eat anything you prefer, but it is better to start with liquids, then soup and crackers, and gradually work up to solid foods.   Please notify your doctor immediately if you have any unusual bleeding, trouble breathing, redness and pain at the surgery site, drainage, fever, or pain not relieved by medication.    Additional Instructions:     Please contact your physician with any problems or Same Day  Surgery at [phone], Monday through Friday 6 am to 4 pm, or [place] at [facility] number at [phone].

## 2021-09-07 ENCOUNTER — Telehealth: Payer: Self-pay

## 2021-09-07 NOTE — Telephone Encounter (Signed)
Pt called on call nurse last night having concerns about having blood clots in her urine. I called patient this morning, pt states it has subsided and was only two very small clots. Pt denies any difficulty urinating, hematuria, thick blood or any other symptoms at this time. I advised pt if symptoms worsen or persist to please contact clinic. Pt aware and verbalized understanding.

## 2021-09-09 ENCOUNTER — Telehealth: Payer: Self-pay | Admitting: *Deleted

## 2021-09-09 NOTE — Telephone Encounter (Signed)
Patient called Triage line to report ongoing hematuria and bladder discomfort. Reviewed side effects from stent placement. Denies any other urinary symptoms. Advised if fevers, pain or dysuria develop to contact the office or seek medical attention. Voiced understanding.

## 2021-09-13 ENCOUNTER — Encounter: Payer: Self-pay | Admitting: Urology

## 2021-09-13 ENCOUNTER — Emergency Department: Payer: BC Managed Care – PPO

## 2021-09-13 ENCOUNTER — Encounter: Payer: Self-pay | Admitting: Emergency Medicine

## 2021-09-13 ENCOUNTER — Emergency Department
Admission: EM | Admit: 2021-09-13 | Discharge: 2021-09-14 | Disposition: A | Discharge status: Home / Self Care | Payer: BC Managed Care – PPO | Attending: Emergency Medicine | Admitting: Emergency Medicine

## 2021-09-13 ENCOUNTER — Ambulatory Visit (INDEPENDENT_AMBULATORY_CARE_PROVIDER_SITE_OTHER): Payer: BC Managed Care – PPO | Admitting: Urology

## 2021-09-13 VITALS — BP 147/83 | HR 92 | Ht 65.0 in | Wt 320.0 lb

## 2021-09-13 DIAGNOSIS — D72829 Elevated white blood cell count, unspecified: Secondary | ICD-10-CM | POA: Insufficient documentation

## 2021-09-13 DIAGNOSIS — R109 Unspecified abdominal pain: Secondary | ICD-10-CM | POA: Insufficient documentation

## 2021-09-13 DIAGNOSIS — R1031 Right lower quadrant pain: Secondary | ICD-10-CM | POA: Diagnosis not present

## 2021-09-13 DIAGNOSIS — Z466 Encounter for fitting and adjustment of urinary device: Secondary | ICD-10-CM | POA: Diagnosis not present

## 2021-09-13 LAB — MICROSCOPIC EXAMINATION: RBC, Urine: >30 /hpf — AB (ref 0–2)

## 2021-09-13 LAB — COMPREHENSIVE METABOLIC PANEL
ALT: 22 U/L (ref 0–44)
AST: 33 U/L (ref 15–41)
Albumin: 4.1 g/dL (ref 3.5–5.0)
Alkaline Phosphatase: 67 U/L (ref 38–126)
Anion gap: 10 (ref 5–15)
BUN: 15 mg/dL (ref 6–20)
CO2: 28 mmol/L (ref 22–32)
Calcium: 9.9 mg/dL (ref 8.9–10.3)
Chloride: 101 mmol/L (ref 98–111)
Creatinine, Ser: 0.82 mg/dL (ref 0.44–1.00)
GFR, Estimated: >60 mL/min (ref >60)
Glucose, Bld: 128 mg/dL — ABNORMAL HIGH (ref 70–99)
Potassium: 5.2 mmol/L — ABNORMAL HIGH (ref 3.5–5.1)
Sodium: 139 mmol/L (ref 135–145)
Total Bilirubin: 1.3 mg/dL — ABNORMAL HIGH (ref 0.3–1.2)
Total Protein: 8.5 g/dL — ABNORMAL HIGH (ref 6.5–8.1)

## 2021-09-13 LAB — URINALYSIS, ROUTINE W REFLEX MICROSCOPIC
Bacteria, UA: NONE SEEN
Bilirubin Urine: NEGATIVE
Glucose, UA: NEGATIVE mg/dL
Ketones, ur: NEGATIVE mg/dL
Leukocytes,Ua: NEGATIVE
Nitrite: NEGATIVE
Protein, ur: 100 mg/dL — AB
RBC / HPF: >50 RBC/hpf — ABNORMAL HIGH (ref 0–5)
Specific Gravity, Urine: 1.024 (ref 1.005–1.030)
Squamous Epithelial / HPF: NONE SEEN (ref 0–5)
WBC, UA: >50 WBC/hpf — ABNORMAL HIGH (ref 0–5)
pH: 5 (ref 5.0–8.0)

## 2021-09-13 LAB — CBC WITH DIFFERENTIAL/PLATELET
Abs Immature Granulocytes: 0.05 10*3/uL (ref 0.00–0.07)
Basophils Absolute: 0.1 10*3/uL (ref 0.0–0.1)
Basophils Relative: 1 %
Eosinophils Absolute: 0.2 10*3/uL (ref 0.0–0.5)
Eosinophils Relative: 1 %
HCT: 45.1 % (ref 36.0–46.0)
Hemoglobin: 14.4 g/dL (ref 12.0–15.0)
Immature Granulocytes: 0 %
Lymphocytes Relative: 13 %
Lymphs Abs: 1.5 10*3/uL (ref 0.7–4.0)
MCH: 24.9 pg — ABNORMAL LOW (ref 26.0–34.0)
MCHC: 31.9 g/dL (ref 30.0–36.0)
MCV: 78 fL — ABNORMAL LOW (ref 80.0–100.0)
Monocytes Absolute: 0.4 10*3/uL (ref 0.1–1.0)
Monocytes Relative: 4 %
Neutro Abs: 9.1 10*3/uL — ABNORMAL HIGH (ref 1.7–7.7)
Neutrophils Relative %: 81 %
Platelets: 451 10*3/uL — ABNORMAL HIGH (ref 150–400)
RBC: 5.78 MIL/uL — ABNORMAL HIGH (ref 3.87–5.11)
RDW: 15 % (ref 11.5–15.5)
WBC: 11.2 10*3/uL — ABNORMAL HIGH (ref 4.0–10.5)
nRBC: 0 % (ref 0.0–0.2)

## 2021-09-13 LAB — URINALYSIS, COMPLETE
Bilirubin, UA: NEGATIVE
Glucose, UA: NEGATIVE
Ketones, UA: NEGATIVE
Nitrite, UA: NEGATIVE
Specific Gravity, UA: 1.025 (ref 1.005–1.030)
Urobilinogen, Ur: 0.2 mg/dL (ref 0.2–1.0)
pH, UA: 5.5 (ref 5.0–7.5)

## 2021-09-13 LAB — POC URINE PREG, ED: Preg Test, Ur: NEGATIVE

## 2021-09-13 MED ORDER — CIPROFLOXACIN HCL 500 MG PO TABS
500.0000 mg | ORAL_TABLET | Freq: Once | ORAL | Status: AC
  Administered 2021-09-13: 500 mg via ORAL

## 2021-09-13 MED ORDER — CEPHALEXIN 500 MG PO CAPS: 500.0000 mg | ORAL_CAPSULE | Freq: Three times a day (TID) | ORAL | 0 refills | Status: AC

## 2021-09-13 MED ORDER — FENTANYL CITRATE PF 50 MCG/ML IJ SOSY
100.0000 ug | PREFILLED_SYRINGE | Freq: Once | INTRAMUSCULAR | Status: AC
  Administered 2021-09-13: 100 ug via INTRAVENOUS
  Filled 2021-09-13: qty 2

## 2021-09-13 MED ORDER — CEFAZOLIN SODIUM-DEXTROSE 1-4 GM/50ML-% IV SOLN
1.0000 g | Freq: Once | INTRAVENOUS | Status: AC
  Administered 2021-09-14: 1 g via INTRAVENOUS
  Filled 2021-09-13: qty 50

## 2021-09-13 MED ORDER — HYDROMORPHONE HCL 1 MG/ML IJ SOLN
1.0000 mg | Freq: Once | INTRAMUSCULAR | Status: AC
  Administered 2021-09-13: 1 mg via INTRAVENOUS
  Filled 2021-09-13: qty 1

## 2021-09-13 MED ORDER — IOHEXOL 350 MG/ML SOLN
100.0000 mL | Freq: Once | INTRAVENOUS | Status: AC | PRN
  Administered 2021-09-13: 100 mL via INTRAVENOUS

## 2021-09-13 MED ORDER — ONDANSETRON HCL 4 MG/2ML IJ SOLN
4.0000 mg | Freq: Once | INTRAMUSCULAR | Status: AC
  Administered 2021-09-13: 4 mg via INTRAVENOUS
  Filled 2021-09-13: qty 2

## 2021-09-13 MED ORDER — SODIUM CHLORIDE 0.9 % IV BOLUS
1000.0000 mL | Freq: Once | INTRAVENOUS | Status: AC
  Administered 2021-09-13: 1000 mL via INTRAVENOUS

## 2021-09-13 NOTE — ED Provider Notes (Signed)
[facility] Provider Note    Event Date/Time   First MD Initiated Contact with Patient 09/13/21 2208     (approximate)   History   Flank Pain   HPI  Diana Sexton is a 36 y.o. female with recent ureteral stent removal presents to the emergency department for treatment and evaluation of left flank pain that seem to be worse this afternoon after having the stent removed.  She denies fever or dysuria.  She spoke with Dr. Bernardo Heater who performed the procedure who encouraged her to return to the emergency department.   Past Medical History:  Diagnosis Date   Anemia    Anxiety    Cellulitis 2012   caused stillbirth   Deep vein thrombosis (DVT) of right lower extremity ([place]) 02/04/2020   a.) s/p hernia surgery 01/06/2020 and D&C 01/27/2020 --> DVT (+) 02/04/2020 --> occlusive thrombus in the RIGHT posterior tibial vein; superficial thrombus in the greater saphenous extending from upper calf to distal RIGHT thigh   Deep vein thrombosis (DVT) of right upper extremity ([place]) 07/10/2011   a.) occurred in setting of pregnancy --> thrombus noted RIGHT subclavian (occlusive), axillary, and brachial (occlusive) veins   Deep vein thrombosis (DVT) of right upper extremity ([place]) 02/08/2018   a.) occlusive thrombus in the basilic vein and one of the brachial veins   Dysmenorrhea    Family history of breast cancer 11/2013   BRCA/MyRisk neg   Headache    related to hormonal meds   Hernia, abdominal    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[place]) 02/09/2018   a.) hypercoagulable workup (factor V, ATIII, protein C, protein S, lupus anticoagulant, and anticardiolipin Ab) otherwise NEGATIVE   History of kidney stones    Increased risk of breast cancer 11/2013   IBIS=27%   Obesity    Personal history of noncompliance with medical treatment    a.) 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); 3 seperate DVTs; advised of lifelong anticoagulation therapy, however noncompliant  increasing risk of subsequent thrombus/emobolus   Ventral hernia      Physical Exam   Triage Vital Signs: ED Triage Vitals  Enc Vitals Group     BP 09/13/21 1833 (!) 153/80     Pulse Rate 09/13/21 1833 (!) 106     Resp 09/13/21 1833 18     Temp 09/13/21 1833 98.4 F (36.9 C)     Temp Source 09/13/21 1833 Oral     SpO2 09/13/21 1833 96 %     Weight 09/13/21 1832 (!) 319 lb 10.7 oz (145 kg)     Height 09/13/21 1832 5' 5"  (1.651 m)     Head Circumference --      Peak Flow --      Pain Score 09/13/21 1832 10     Pain Loc --      Pain Edu? --      Excl. in [place]? --     Most recent vital signs: Vitals:   09/13/21 1833 09/13/21 2245  BP: (!) 153/80 131/63  Pulse: (!) 106 69  Resp: 18 15  Temp: 98.4 F (36.9 C) 97.8 F (36.6 C)  SpO2: 96% 96%    General: Awake, no distress.  CV:  Good peripheral perfusion.  Resp:  Normal effort.  Abd:  No distention.  Other:  Left CVA tenderness   ED Results / Procedures / Treatments   Labs (all labs ordered are listed, but only abnormal results are displayed) Labs Reviewed  CBC WITH DIFFERENTIAL/PLATELET - Abnormal;  Notable for the following components:      Result Value   WBC 11.2 (*)    RBC 5.78 (*)    MCV 78.0 (*)    MCH 24.9 (*)    Platelets 451 (*)    Neutro Abs 9.1 (*)    All other components within normal limits  COMPREHENSIVE METABOLIC PANEL - Abnormal; Notable for the following components:   Potassium 5.2 (*)    Glucose, Bld 128 (*)    Total Protein 8.5 (*)    Total Bilirubin 1.3 (*)    All other components within normal limits  URINALYSIS, ROUTINE W REFLEX MICROSCOPIC - Abnormal; Notable for the following components:   Color, Urine AMBER (*)    APPearance CLOUDY (*)    Hgb urine dipstick LARGE (*)    Protein, ur 100 (*)    RBC / HPF >50 (*)    WBC, UA >50 (*)    All other components within normal limits  URINE CULTURE  POC URINE PREG, ED     EKG     RADIOLOGY  Mild left hydronephrosis.  No  obstruction.  No hematoma  I have independently reviewed and interpreted imaging as well as reviewed report from radiology.  PROCEDURES:  Critical Care performed: No  Procedures   MEDICATIONS ORDERED IN ED:  Medications  ceFAZolin (ANCEF) IVPB 1 g/50 mL premix (has no administration in time range)  fentaNYL (SUBLIMAZE) injection 100 mcg (has no administration in time range)  sodium chloride 0.9 % bolus 1,000 mL (0 mLs Intravenous Stopped 09/13/21 2053)  HYDROmorphone (DILAUDID) injection 1 mg (1 mg Intravenous Given 09/13/21 1858)  ondansetron (ZOFRAN) injection 4 mg (4 mg Intravenous Given 09/13/21 1858)  iohexol (OMNIPAQUE) 350 MG/ML injection 100 mL (100 mLs Intravenous Contrast Given 09/13/21 2054)     IMPRESSION / MDM / ASSESSMENT AND PLAN / ED COURSE   I reviewed the triage vital signs and the nursing notes.  Differential diagnosis includes, but is not limited to: Postsurgical infection, ureteral obstruction  Patient's presentation is most consistent with acute illness / injury with system symptoms.  36 year old female presenting to the emergency department after having had a ureteral stent removed this morning.  See HPI for further details.  While awaiting ER room signout, she had labs and a urinalysis.  Labs show a mild leukocytosis at 11.2.  Urinalysis shows greater than 50 white blood cells with protein and greater than 50 red blood cells and large amount of hemoglobin.  On exam, she does have significant left flank pain.  Plan will be to discuss with Dr. Bernardo Heater.  Consulted Dr. Bernardo Heater via phone.  Plan will be to give her IV antibiotics, control her pain, and as long as she does not have a fever or have any other changes she may be discharged home with oral antibiotics and will follow up with her in the office.  This was discussed with the patient who is agreeable to the plan.      FINAL CLINICAL IMPRESSION(S) / ED DIAGNOSES   Final diagnoses:  Left flank pain      Rx / DC Orders   ED Discharge Orders          Ordered    cephALEXin (KEFLEX) 500 MG capsule  3 times daily        09/13/21 2349             Note:  This document was prepared using Dragon voice recognition software and may include unintentional dictation  errors.   Victorino Dike, FNP 09/13/21 2358    Blake Divine, MD 09/14/21 [phone]

## 2021-09-13 NOTE — ED Triage Notes (Signed)
Pt had stent for left kidney stones removed at 230 today. Pain is worse than before the stent. UA performed today.

## 2021-09-13 NOTE — Discharge Instructions (Signed)
Please schedule a follow up with urology.  Return to the ER for symptoms that change or worsen.

## 2021-09-13 NOTE — Progress Notes (Signed)
Indications: Patient is 36 y.o., who is s/p ureteroscopic removal of a 10 mm left renal pelvic calculus 09/06/2021.  No significant postoperative problems though is having bothersome stent symptoms.  The patient is presenting today for stent removal.  Procedure:  Flexible Cystoscopy with stent removal (49826)  Timeout was performed and the correct patient, procedure and participants were identified.    Description:  The patient was prepped and draped in the usual sterile fashion. Flexible cystosopy was performed.  The stent was visualized, grasped, and removed intact without difficulty. The patient tolerated the procedure well.  A single dose of oral antibiotics was given.  Complications:  None  Plan:  Instructed to call for fever/flank pain post stent removal We discussed repeat metabolic evaluation in 4158 however this was never done.  She is interested in pursuing and will be notified with the results 6 month follow-up office visit with KUB   John Giovanni, MD

## 2021-09-13 NOTE — ED Provider Triage Note (Signed)
  Emergency Medicine Provider Triage Evaluation Note  Veatrice Eckstein Gertsch , a 36 y.o.female,  was evaluated in triage.  Pt complains of left-sided flank pain.  She had a stent placed last week for a 10 mm kidney stone.  She had the stent removed today, but is now having excruciating pain in her left flank.  She reports the pain is worse than before the stent was placed.   Review of Systems  Positive: Left-sided flank pain Negative: Denies fever, chest pain, vomiting  Physical Exam   Vitals:   09/13/21 1833  BP: (!) 153/80  Pulse: (!) 106  Resp: 18  Temp: 98.4 F (36.9 C)  SpO2: 96%   Gen:   Awake, appears very uncomfortable Resp:  Normal effort  MSK:   Moves extremities without difficulty  Other:    Medical Decision Making  Given the patient's initial medical screening exam, the following diagnostic evaluation has been ordered. The patient will be placed in the appropriate treatment space, once one is available, to complete the evaluation and treatment. I have discussed the plan of care with the patient and I have advised the patient that an ED physician or mid-level practitioner will reevaluate their condition after the test results have been received, as the results may give them additional insight into the type of treatment they may need.    Diagnostics: Labs, UA, abdominal/pelvic CT  Treatments: Ondansetron, hydromorphone   Teodoro Spray, [place] 09/13/21 1847

## 2021-09-13 NOTE — Patient Instructions (Signed)

## 2021-09-14 ENCOUNTER — Telehealth: Payer: Self-pay | Admitting: Urology

## 2021-09-14 DIAGNOSIS — N1330 Unspecified hydronephrosis: Secondary | ICD-10-CM

## 2021-09-14 NOTE — Telephone Encounter (Signed)
Seen in the ED last night with renal colic after stent removal.  Please schedule follow-up appointment with Larene Beach or Sam in 2-3 weeks with RUS prior.  RUS order entered

## 2021-09-15 LAB — URINE CULTURE: Culture: 10000 — AB

## 2021-09-29 ENCOUNTER — Ambulatory Visit
Admission: RE | Admit: 2021-09-29 | Discharge: 2021-09-29 | Disposition: A | Discharge status: Home / Self Care | Payer: BC Managed Care – PPO | Source: Ambulatory Visit | Attending: Urology | Admitting: Urology

## 2021-09-29 DIAGNOSIS — N1330 Unspecified hydronephrosis: Secondary | ICD-10-CM | POA: Insufficient documentation

## 2021-10-05 ENCOUNTER — Ambulatory Visit: Payer: BC Managed Care – PPO | Admitting: Physician Assistant

## 2021-10-06 ENCOUNTER — Encounter: Payer: Self-pay | Admitting: Nurse Practitioner

## 2021-10-06 ENCOUNTER — Encounter: Payer: Self-pay | Admitting: Physician Assistant

## 2021-10-06 ENCOUNTER — Ambulatory Visit (INDEPENDENT_AMBULATORY_CARE_PROVIDER_SITE_OTHER): Payer: BC Managed Care – PPO | Admitting: Physician Assistant

## 2021-10-06 VITALS — BP 137/86 | HR 79 | Ht 65.0 in | Wt 315.0 lb

## 2021-10-06 DIAGNOSIS — N1330 Unspecified hydronephrosis: Secondary | ICD-10-CM | POA: Diagnosis not present

## 2021-10-06 NOTE — Progress Notes (Signed)
10/06/2021 5:06 PM   Diana Sexton 10-28-1985 [id number]  CC: Chief Complaint  Patient presents with   Nephrolithiasis   HPI: Diana Sexton is a 36 y.o. female with PMH nephrolithiasis who underwent ureteroscopy with Dr. Bernardo Heater on 09/06/2021 for management of a 1 cm left renal pelvic stone who presents today for ED follow-up.  She underwent cystoscopy stent removal with Dr. Bernardo Heater on 09/13/2021.  Later that day, she presented to the emergency department with increased left flank pain.  CTAP with contrast showed left hydroureteronephrosis without obstructing stone.  UA was notable for pyuria and microscopic hematuria but urine culture finalized with low colony counts of corynebacterium.  She was treated with 1 dose of cefazolin in the ED and discharged with Keflex.  Today she reports her left flank pain has completely resolved.  She denies dysuria, hematuria, nausea, vomiting, fever, or chills.  She reports that she passed a stone fragment at home the day after her ureteroscopy and brings it with her for analysis today.  She has been recommended to undergo Litholink by Dr. Bernardo Heater.  She underwent renal ultrasound on 09/29/2021 with persistent mild left hydronephrosis.  Bilateral ureteral jets were seen.  PMH: Past Medical History:  Diagnosis Date   Anemia    Anxiety    Cellulitis 2012   caused stillbirth   Deep vein thrombosis (DVT) of right lower extremity ([place]) 02/04/2020   a.) s/p hernia surgery 01/06/2020 and D&C 01/27/2020 --> DVT (+) 02/04/2020 --> occlusive thrombus in the RIGHT posterior tibial vein; superficial thrombus in the greater saphenous extending from upper calf to distal RIGHT thigh   Deep vein thrombosis (DVT) of right upper extremity ([place]) 07/10/2011   a.) occurred in setting of pregnancy --> thrombus noted RIGHT subclavian (occlusive), axillary, and brachial (occlusive) veins   Deep vein thrombosis (DVT) of right upper extremity ([place]) 02/08/2018   a.)  occlusive thrombus in the basilic vein and one of the brachial veins   Dysmenorrhea    Family history of breast cancer 11/2013   BRCA/MyRisk neg   Headache    related to hormonal meds   Hernia, abdominal    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[place]) 02/09/2018   a.) hypercoagulable workup (factor V, ATIII, protein C, protein S, lupus anticoagulant, and anticardiolipin Ab) otherwise NEGATIVE   History of kidney stones    Increased risk of breast cancer 11/2013   IBIS=27%   Obesity    Personal history of noncompliance with medical treatment    a.) 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); 3 seperate DVTs; advised of lifelong anticoagulation therapy, however noncompliant increasing risk of subsequent thrombus/emobolus   Ventral hernia     Surgical History: Past Surgical History:  Procedure Laterality Date   CHOLECYSTECTOMY  2007   CYSTOSCOPY W/ RETROGRADES Left 02/02/2017   Procedure: CYSTOSCOPY WITH RETROGRADE PYELOGRAM;  Surgeon: Abbie Sons, MD;  Location: [facility];  Service: Urology;  Laterality: Left;   CYSTOSCOPY W/ RETROGRADES Right 06/26/2019   Procedure: CYSTOSCOPY WITH RETROGRADE PYELOGRAM;  Surgeon: Abbie Sons, MD;  Location: [facility];  Service: Urology;  Laterality: Right;   CYSTOSCOPY/URETEROSCOPY/HOLMIUM LASER/STENT PLACEMENT Left 02/02/2017   Procedure: CYSTOSCOPY/URETEROSCOPY/HOLMIUM LASER/STENT PLACEMENT;  Surgeon: Abbie Sons, MD;  Location: [facility];  Service: Urology;  Laterality: Left;   CYSTOSCOPY/URETEROSCOPY/HOLMIUM LASER/STENT PLACEMENT Right 06/26/2019   Procedure: CYSTOSCOPY/URETEROSCOPY/HOLMIUM LASER/STENT PLACEMENT;  Surgeon: Abbie Sons, MD;  Location: [facility];  Service: Urology;  Laterality: Right;   CYSTOSCOPY/URETEROSCOPY/HOLMIUM LASER/STENT PLACEMENT Left 09/06/2021   Procedure: CYSTOSCOPY/URETEROSCOPY/HOLMIUM  LASER/STENT PLACEMENT;  Surgeon: Abbie Sons, MD;  Location: [facility];  Service: Urology;  Laterality: Left;    DILITATION & CURRETTAGE/HYSTROSCOPY WITH ESSURE     DILITATION & CURRETTAGE/HYSTROSCOPY WITH NOVASURE ABLATION N/A 12/24/2018   Procedure: DILATATION & CURETTAGE/HYSTEROSCOPY WITH ENDOMETRIAL ABLATION (MINERVA SYSTEM);  Surgeon: Will Bonnet, MD;  Location: [facility];  Service: Gynecology;  Laterality: N/A;   HYSTEROSCOPY WITH D & C N/A 01/27/2020   Procedure: DILATATION AND CURETTAGE /HYSTEROSCOPY;  Surgeon: Will Bonnet, MD;  Location: [facility];  Service: Gynecology;  Laterality: N/A;   INTRAUTERINE DEVICE (IUD) INSERTION     TONSILLECTOMY AND ADENOIDECTOMY     TUBAL LIGATION  2014   XI ROBOTIC ASSISTED VENTRAL HERNIA N/A 01/06/2020   Procedure: XI ROBOTIC ASSISTED VENTRAL HERNIA;  Surgeon: Olean Ree, MD;  Location: [facility];  Service: General;  Laterality: N/A;    Home Medications:  Allergies as of 10/06/2021       Reactions   Oxycodone Rash   Penicillins Rash   TOLERATED CEFAZOLIN Has had a PCN reaction causing, facial/tongue/throat swelling, SOB or lightheadedness with hypotension: NO Has had a PCN reaction causing severe rash involving mucus membranes or skin necrosis: NO Has had a PCN reaction that required hospitalization: No Has had a PCN reaction occurring within the last 10 years: Yes If all of the above answers are "NO", then may proceed with Cephalosporin use. Updated 01/06/20; Reaction was low severity rash. Ancef given. No reaction        Medication List        Accurate as of October 06, 2021  5:06 PM. If you have any questions, ask your nurse or doctor.          STOP taking these medications    HYDROmorphone 2 MG tablet Commonly known as: Dilaudid Stopped by: Debroah Loop, PA-C   ondansetron 4 MG tablet Commonly known as: ZOFRAN Stopped by: Debroah Loop, PA-C   oxybutynin 5 MG tablet Commonly known as: DITROPAN Stopped by: Debroah Loop, PA-C   tamsulosin 0.4 MG Caps capsule Commonly known as: FLOMAX Stopped  by: Debroah Loop, PA-C       TAKE these medications    ibuprofen 800 MG tablet Commonly known as: ADVIL Take 1 tablet (800 mg total) by mouth every 8 (eight) hours as needed. Take three times daily for 3 days. Then every 8 hours as needed.   Saxenda 18 MG/3ML Sopn Generic drug: Liraglutide -Weight Management Inject 1.2 mg into the skin daily. Inject 1.81m daily for 4 weeks. Then increase to 1.854mdaily        Allergies:  Allergies  Allergen Reactions   Oxycodone Rash   Penicillins Rash    TOLERATED CEFAZOLIN Has had a PCN reaction causing, facial/tongue/throat swelling, SOB or lightheadedness with hypotension: NO Has had a PCN reaction causing severe rash involving mucus membranes or skin necrosis: NO Has had a PCN reaction that required hospitalization: No Has had a PCN reaction occurring within the last 10 years: Yes If all of the above answers are "NO", then may proceed with Cephalosporin use. Updated 01/06/20; Reaction was low severity rash. Ancef given. No reaction     Family History: Family History  Problem Relation Age of Onset   Cancer Mother        breast   Stroke Mother    Diabetes Father    Hypertension Father    Heart disease Maternal Grandfather    Dementia Paternal Grandmother    Bladder Cancer  Neg Hx    Kidney cancer Neg Hx     Social History:   reports that she has never smoked. She has never used smokeless tobacco. She reports that she does not currently use alcohol. She reports that she does not use drugs.  Physical Exam: BP 137/86   Pulse 79   Ht 5' 5"  (1.651 m)   Wt (!) 315 lb (142.9 kg)   BMI 52.42 kg/m   Constitutional:  Alert and oriented, no acute distress, nontoxic appearing HEENT: [place], AT Cardiovascular: No clubbing, cyanosis, or edema Respiratory: Normal respiratory effort, no increased work of breathing Skin: No rashes, bruises or suspicious lesions Neurologic: Grossly intact, no focal deficits, moving all 4  extremities Psychiatric: Normal mood and affect  Pertinent Imaging: Results for orders placed during the hospital encounter of 09/29/21  Ultrasound renal complete  Narrative CLINICAL DATA:  Hydronephrosis.  Stent placed September 06, 2021.  EXAM: RENAL / URINARY TRACT ULTRASOUND COMPLETE  COMPARISON:  CT of the abdomen and pelvis September 13, 2021.  FINDINGS: Right Kidney:  Renal measurements: 13.8 x 5.1 x 6.1 cm = volume: 226 mL. Contains a 6.9 mm nonobstructive stone.  Left Kidney:  Renal measurements: 14.0 x 6.0 x 6.5 cm = volume: 284 mL. Mild hydronephrosis remains. No other abnormalities.  Bladder:  The bladder is unremarkable other than a small postvoid residual of 22 cc.  Other:  None.  IMPRESSION: 1. 6.9 mm nonobstructive stone in the right kidney. 2. Mild hydronephrosis on remains on the left. No stones identified. 3. Small postvoid residual in an otherwise normal appearing bladder.   Electronically Signed By: Dorise Bullion III M.D. On: 09/29/2021 12:23  I personally reviewed the images referenced above and note mild left hydronephrosis.  Assessment & Plan:   1. Hydronephrosis, unspecified hydronephrosis type Left hydronephrosis is persistent on follow-up renal ultrasound, however she is asymptomatic today.  We will send her stone fragment for analysis, check her renal function with a BMP today, and plan to repeat a renal ultrasound in 2 months.  She is in agreement with this plan. - US RENAL; Future - Basic metabolic panel - Calculi, with Photograph (to Clinical Lab)   Return for Will call with results.  Debroah Loop, PA-C  [facility] [address] [place], [place] [address] [phone]

## 2021-10-07 LAB — BASIC METABOLIC PANEL
BUN/Creatinine Ratio: 13 (ref 9–23)
BUN: 8 mg/dL (ref 6–20)
CO2: 24 mmol/L (ref 20–29)
Calcium: 9.8 mg/dL (ref 8.7–10.2)
Chloride: 100 mmol/L (ref 96–106)
Creatinine, Ser: 0.64 mg/dL (ref 0.57–1.00)
Glucose: 96 mg/dL (ref 70–99)
Potassium: 4.5 mmol/L (ref 3.5–5.2)
Sodium: 140 mmol/L (ref 134–144)
eGFR: 118 mL/min/{1.73_m2} (ref >59)

## 2021-10-10 ENCOUNTER — Other Ambulatory Visit: Payer: BC Managed Care – PPO

## 2021-10-10 ENCOUNTER — Other Ambulatory Visit: Payer: Self-pay

## 2021-10-10 DIAGNOSIS — N200 Calculus of kidney: Secondary | ICD-10-CM

## 2021-10-10 DIAGNOSIS — N1330 Unspecified hydronephrosis: Secondary | ICD-10-CM

## 2021-10-17 LAB — CALCULI, WITH PHOTOGRAPH (CLINICAL LAB)
Calcium Oxalate Dihydrate: 40 %
Calcium Oxalate Monohydrate: 60 %
Weight Calculi: 24 mg

## 2021-10-20 ENCOUNTER — Other Ambulatory Visit: Payer: Self-pay | Admitting: *Deleted

## 2021-10-20 DIAGNOSIS — N200 Calculus of kidney: Secondary | ICD-10-CM

## 2021-10-28 NOTE — Progress Notes (Unsigned)
There were no vitals taken for this visit.   Subjective:    Patient ID: Diana Sexton, female    DOB: 1985/08/11, 36 y.o.   MRN: [id number]  HPI: Diana Sexton is a 36 y.o. female  No chief complaint on file.  WEIGHT GAIN Duration: years Previous attempts at weight loss: yes Complications of obesity: IFG Peak weight: 340lb Weight loss goal:  Weight loss to date:  Requesting obesity pharmacotherapy: yes Current weight loss supplements/medications: no Previous weight loss supplements/meds: no Calories:  Patient has lost about 20lbs   Relevant past medical, surgical, family and social history reviewed and updated as indicated. Interim medical history since our last visit reviewed. Allergies and medications reviewed and updated.  Review of Systems  Constitutional:  Negative for unexpected weight change.    Per HPI unless specifically indicated above     Objective:    There were no vitals taken for this visit.  Wt Readings from Last 3 Encounters:  10/06/21 (!) 315 lb (142.9 kg)  09/13/21 (!) 319 lb 10.7 oz (145 kg)  09/13/21 (!) 320 lb (145.2 kg)    Physical Exam Vitals and nursing note reviewed.  Constitutional:      General: She is not in acute distress.    Appearance: Normal appearance. She is obese. She is not ill-appearing, toxic-appearing or diaphoretic.  HENT:     Head: Normocephalic.     Right Ear: External ear normal.     Left Ear: External ear normal.     Nose: Nose normal.     Mouth/Throat:     Mouth: Mucous membranes are moist.     Pharynx: Oropharynx is clear.  Eyes:     General:        Right eye: No discharge.        Left eye: No discharge.     Extraocular Movements: Extraocular movements intact.     Conjunctiva/sclera: Conjunctivae normal.     Pupils: Pupils are equal, round, and reactive to light.  Cardiovascular:     Rate and Rhythm: Normal rate and regular rhythm.     Heart sounds: No murmur heard. Pulmonary:     Effort: Pulmonary  effort is normal. No respiratory distress.     Breath sounds: Normal breath sounds. No wheezing or rales.  Musculoskeletal:     Cervical back: Normal range of motion and neck supple.  Skin:    General: Skin is warm and dry.     Capillary Refill: Capillary refill takes less than 2 seconds.  Neurological:     General: No focal deficit present.     Mental Status: She is alert and oriented to person, place, and time. Mental status is at baseline.  Psychiatric:        Mood and Affect: Mood normal.        Behavior: Behavior normal.        Thought Content: Thought content normal.        Judgment: Judgment normal.    Results for orders placed or performed in visit on 10/10/21  Calculi, with Photograph (to Clinical Lab)  Result Value Ref Range   Source Calculi Comment    Color Calculi Tan    Size Calculi 3x4 mm   Weight Calculi 24.0 mg   Composition Calculi Comment    Calcium Oxalate Monohydrate 60 %   Calcium Oxalate Dihydrate 40 %   Photo Calculi Comment    Comment Calculi 3 Comment    Please Note: Comment  DISCLAIMER: Comment       Assessment & Plan:   Problem List Items Addressed This Visit      Endocrine   IFG (impaired fasting glucose) - Primary     Other   Anxiety and depression     Follow up plan: No follow-ups on file.

## 2021-10-31 ENCOUNTER — Encounter: Payer: Self-pay | Admitting: Nurse Practitioner

## 2021-10-31 ENCOUNTER — Ambulatory Visit: Payer: BC Managed Care – PPO | Admitting: Nurse Practitioner

## 2021-10-31 DIAGNOSIS — F419 Anxiety disorder, unspecified: Secondary | ICD-10-CM

## 2021-10-31 DIAGNOSIS — R7301 Impaired fasting glucose: Secondary | ICD-10-CM

## 2021-10-31 NOTE — Assessment & Plan Note (Signed)
Chronic.  Would like to work on weight loss without medication.  Feels like she has developed habits to be successful.  If she decides to do medication in the future Wegovy would be a better option due to it being a weekly injection vs daily.  Follow up in 6 months.  Call sooner if concerns arise.

## 2021-11-09 ENCOUNTER — Encounter: Payer: Self-pay | Admitting: Nurse Practitioner

## 2021-11-10 NOTE — Progress Notes (Signed)
Temp 97.7 F (36.5 C) (Oral) Comment: Patient reports   Subjective:    Patient ID: Diana Sexton, female    DOB: 1985-06-28, 36 y.o.   MRN: [id number]  HPI: [name] is a 36 y.o. female  Chief Complaint  Patient presents with   Generalized Body Aches    Patient reports testing + for covid at home, c/o low grade fever, chills, headaches.    Headache   UPPER RESPIRATORY TRACT INFECTION Worst symptom: Symptoms started on Wednesday.  Tested positive for covid. Fever: no Cough: yes Shortness of breath: no Wheezing: no Chest pain: no Chest tightness: no Chest congestion: no Nasal congestion: yes Runny nose: yes Post nasal drip: yes Sneezing: no Sore throat: no Swollen glands: no Sinus pressure: no Headache: yes Face pain: no Toothache: no Ear pain: no bilateral Ear pressure: no bilateral Eyes red/itching:no Eye drainage/crusting: no  Vomiting: no Rash: no Fatigue: yes Sick contacts: no Strep contacts: no  Context: better Recurrent sinusitis: no Relief with OTC cold/cough medications: no  Treatments attempted: none   Relevant past medical, surgical, family and social history reviewed and updated as indicated. Interim medical history since our last visit reviewed. Allergies and medications reviewed and updated.  Review of Systems  Constitutional:  Positive for fatigue. Negative for fever.  HENT:  Positive for congestion, postnasal drip and rhinorrhea. Negative for dental problem, ear pain, sinus pressure, sinus pain, sneezing and sore throat.   Respiratory:  Positive for cough. Negative for shortness of breath and wheezing.   Cardiovascular:  Negative for chest pain.  Gastrointestinal:  Negative for vomiting.  Skin:  Negative for rash.  Neurological:  Positive for headaches.    Per HPI unless specifically indicated above     Objective:    Temp 97.7 F (36.5 C) (Oral) Comment: Patient reports  Wt Readings from Last 3 Encounters:  10/31/21 (!)  318 lb 9.6 oz (144.5 kg)  10/06/21 (!) 315 lb (142.9 kg)  09/13/21 (!) 319 lb 10.7 oz (145 kg)    Physical Exam Vitals and nursing note reviewed.  HENT:     Head: Normocephalic.     Right Ear: Hearing normal.     Left Ear: Hearing normal.     Nose: Nose normal.  Eyes:     Pupils: Pupils are equal, round, and reactive to light.  Pulmonary:     Effort: Pulmonary effort is normal. No respiratory distress.  Neurological:     Mental Status: She is alert.  Psychiatric:        Mood and Affect: Mood normal.        Behavior: Behavior normal.        Thought Content: Thought content normal.        Judgment: Judgment normal.     Results for orders placed or performed in visit on 10/10/21  Calculi, with Photograph (to Clinical Lab)  Result Value Ref Range   Source Calculi Comment    Color Calculi Tan    Size Calculi 3x4 mm   Weight Calculi 24.0 mg   Composition Calculi Comment    Calcium Oxalate Monohydrate 60 %   Calcium Oxalate Dihydrate 40 %   Photo Calculi Comment    Comment Calculi 3 Comment    Please Note: Comment    DISCLAIMER: Comment       Assessment & Plan:   Problem List Items Addressed This Visit   None Visit Diagnoses     COVID-19    -  Primary  Will treat with monupiravir. Discussed side effects and benefits of medication. Discussed signs/symptoms to monitor for. FU if not improved. Stay hydrated.   Relevant Medications   molnupiravir EUA (LAGEVRIO) 200 mg CAPS capsule        Follow up plan: Return if symptoms worsen or fail to improve.   This visit was completed via MyChart due to the restrictions of the COVID-19 pandemic. All issues as above were discussed and addressed. Physical exam was done as above through visual confirmation on MyChart. If it was felt that the patient should be evaluated in the office, they were directed there. The patient verbally consented to this visit. Location of the patient: Home Location of the provider: Office Those  involved with this call:  Provider: Jon Billings, NP CMA: Valinda Hoar, [place] Desk/Registration: Lynnell Catalan This encounter was conducted via video.  I spent 20 dedicated to the care of this patient on the date of this encounter to include previsit review of symptoms, plan of care, medications, follow up, face to face time with the patient, and post visit ordering of testing.

## 2021-11-11 ENCOUNTER — Encounter: Payer: Self-pay | Admitting: Nurse Practitioner

## 2021-11-11 ENCOUNTER — Telehealth (INDEPENDENT_AMBULATORY_CARE_PROVIDER_SITE_OTHER): Payer: BC Managed Care – PPO | Admitting: Nurse Practitioner

## 2021-11-11 VITALS — Temp 97.7°F

## 2021-11-11 DIAGNOSIS — U071 COVID-19: Secondary | ICD-10-CM

## 2021-11-11 MED ORDER — MOLNUPIRAVIR EUA 200MG CAPSULE: 4.0000 | ORAL_CAPSULE | Freq: Two times a day (BID) | ORAL | 0 refills | Status: AC

## 2021-11-28 ENCOUNTER — Ambulatory Visit
Admission: RE | Admit: 2021-11-28 | Discharge: 2021-11-28 | Disposition: A | Discharge status: Home / Self Care | Payer: BC Managed Care – PPO | Source: Ambulatory Visit | Attending: Physician Assistant | Admitting: Physician Assistant

## 2021-11-28 DIAGNOSIS — Z0389 Encounter for observation for other suspected diseases and conditions ruled out: Secondary | ICD-10-CM | POA: Diagnosis not present

## 2021-11-28 DIAGNOSIS — N1330 Unspecified hydronephrosis: Secondary | ICD-10-CM | POA: Diagnosis not present

## 2021-12-06 ENCOUNTER — Other Ambulatory Visit: Payer: Self-pay | Admitting: Physician Assistant

## 2021-12-06 ENCOUNTER — Encounter: Payer: Self-pay | Admitting: Physician Assistant

## 2021-12-06 ENCOUNTER — Ambulatory Visit: Payer: BC Managed Care – PPO | Admitting: Physician Assistant

## 2021-12-06 ENCOUNTER — Other Ambulatory Visit: Payer: Self-pay | Admitting: Family Medicine

## 2021-12-06 ENCOUNTER — Other Ambulatory Visit: Payer: Self-pay | Admitting: Urology

## 2021-12-06 VITALS — BP 153/79 | HR 81 | Ht 65.0 in | Wt 320.0 lb

## 2021-12-06 DIAGNOSIS — R3129 Other microscopic hematuria: Secondary | ICD-10-CM

## 2021-12-06 DIAGNOSIS — N200 Calculus of kidney: Secondary | ICD-10-CM | POA: Diagnosis not present

## 2021-12-06 LAB — URINALYSIS, COMPLETE
Bilirubin, UA: NEGATIVE
Glucose, UA: NEGATIVE
Ketones, UA: NEGATIVE
Leukocytes,UA: NEGATIVE
Nitrite, UA: NEGATIVE
Specific Gravity, UA: 1.03 (ref 1.005–1.030)
Urobilinogen, Ur: 0.2 mg/dL (ref 0.2–1.0)
pH, UA: 5 (ref 5.0–7.5)

## 2021-12-06 LAB — MICROSCOPIC EXAMINATION: Epithelial Cells (non renal): >10 /hpf — AB (ref 0–10)

## 2021-12-06 NOTE — Progress Notes (Signed)
12/06/2021 11:41 AM   Diana Sexton 02-17-1985 [id number]  CC: Chief Complaint  Patient presents with   Nephrolithiasis   HPI: Diana Sexton is a 36 y.o. female with PMH nephrolithiasis who recently underwent ureteroscopy for management of a 1 cm left renal pelvic stone with Dr. Bernardo Heater and was found to have some persistent mild left hydronephrosis on renal ultrasound less than 1 month later who presents today for repeat ultrasound results and to drop off her Litholink specimens.   Today she reports her left flank pain has resolved.  No acute concerns today.  She dropped off her 24-hour urine sample and had a blood draw upon arrival as above.  Repeat renal ultrasound dated 11/28/2021 with interval resolution of her left hydronephrosis.  PMH: Past Medical History:  Diagnosis Date   Anemia    Anxiety    Cellulitis 2012   caused stillbirth   Deep vein thrombosis (DVT) of right lower extremity ([place]) 02/04/2020   a.) s/p hernia surgery 01/06/2020 and D&C 01/27/2020 --> DVT (+) 02/04/2020 --> occlusive thrombus in the RIGHT posterior tibial vein; superficial thrombus in the greater saphenous extending from upper calf to distal RIGHT thigh   Deep vein thrombosis (DVT) of right upper extremity ([place]) 07/10/2011   a.) occurred in setting of pregnancy --> thrombus noted RIGHT subclavian (occlusive), axillary, and brachial (occlusive) veins   Deep vein thrombosis (DVT) of right upper extremity ([place]) 02/08/2018   a.) occlusive thrombus in the basilic vein and one of the brachial veins   Dysmenorrhea    Family history of breast cancer 11/2013   BRCA/MyRisk neg   Headache    related to hormonal meds   Hernia, abdominal    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[place]) 02/09/2018   a.) hypercoagulable workup (factor V, ATIII, protein C, protein S, lupus anticoagulant, and anticardiolipin Ab) otherwise NEGATIVE   History of kidney stones    Increased risk of breast cancer 11/2013    IBIS=27%   Obesity    Personal history of noncompliance with medical treatment    a.) 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); 3 seperate DVTs; advised of lifelong anticoagulation therapy, however noncompliant increasing risk of subsequent thrombus/emobolus   Ventral hernia     Surgical History: Past Surgical History:  Procedure Laterality Date   CHOLECYSTECTOMY  2007   CYSTOSCOPY W/ RETROGRADES Left 02/02/2017   Procedure: CYSTOSCOPY WITH RETROGRADE PYELOGRAM;  Surgeon: Abbie Sons, MD;  Location: [facility];  Service: Urology;  Laterality: Left;   CYSTOSCOPY W/ RETROGRADES Right 06/26/2019   Procedure: CYSTOSCOPY WITH RETROGRADE PYELOGRAM;  Surgeon: Abbie Sons, MD;  Location: [facility];  Service: Urology;  Laterality: Right;   CYSTOSCOPY/URETEROSCOPY/HOLMIUM LASER/STENT PLACEMENT Left 02/02/2017   Procedure: CYSTOSCOPY/URETEROSCOPY/HOLMIUM LASER/STENT PLACEMENT;  Surgeon: Abbie Sons, MD;  Location: [facility];  Service: Urology;  Laterality: Left;   CYSTOSCOPY/URETEROSCOPY/HOLMIUM LASER/STENT PLACEMENT Right 06/26/2019   Procedure: CYSTOSCOPY/URETEROSCOPY/HOLMIUM LASER/STENT PLACEMENT;  Surgeon: Abbie Sons, MD;  Location: [facility];  Service: Urology;  Laterality: Right;   CYSTOSCOPY/URETEROSCOPY/HOLMIUM LASER/STENT PLACEMENT Left 09/06/2021   Procedure: CYSTOSCOPY/URETEROSCOPY/HOLMIUM LASER/STENT PLACEMENT;  Surgeon: Abbie Sons, MD;  Location: [facility];  Service: Urology;  Laterality: Left;   DILITATION & CURRETTAGE/HYSTROSCOPY WITH ESSURE     DILITATION & CURRETTAGE/HYSTROSCOPY WITH NOVASURE ABLATION N/A 12/24/2018   Procedure: DILATATION & CURETTAGE/HYSTEROSCOPY WITH ENDOMETRIAL ABLATION (MINERVA SYSTEM);  Surgeon: Will Bonnet, MD;  Location: [facility];  Service: Gynecology;  Laterality: N/A;   HYSTEROSCOPY WITH D & C N/A 01/27/2020  Procedure: DILATATION AND CURETTAGE /HYSTEROSCOPY;  Surgeon: Will Bonnet, MD;  Location: [facility];  Service: Gynecology;   Laterality: N/A;   INTRAUTERINE DEVICE (IUD) INSERTION     TONSILLECTOMY AND ADENOIDECTOMY     TUBAL LIGATION  2014   XI ROBOTIC ASSISTED VENTRAL HERNIA N/A 01/06/2020   Procedure: XI ROBOTIC ASSISTED VENTRAL HERNIA;  Surgeon: Olean Ree, MD;  Location: [facility];  Service: General;  Laterality: N/A;    Home Medications:  Allergies as of 12/06/2021       Reactions   Oxycodone Rash   Penicillins Rash   TOLERATED CEFAZOLIN Has had a PCN reaction causing, facial/tongue/throat swelling, SOB or lightheadedness with hypotension: NO Has had a PCN reaction causing severe rash involving mucus membranes or skin necrosis: NO Has had a PCN reaction that required hospitalization: No Has had a PCN reaction occurring within the last 10 years: Yes If all of the above answers are "NO", then may proceed with Cephalosporin use. Updated 01/06/20; Reaction was low severity rash. Ancef given. No reaction        Medication List        Accurate as of December 06, 2021 11:41 AM. If you have any questions, ask your nurse or doctor.          STOP taking these medications    ibuprofen 800 MG tablet Commonly known as: ADVIL Stopped by: Debroah Loop, PA-C        Allergies:  Allergies  Allergen Reactions   Oxycodone Rash   Penicillins Rash    TOLERATED CEFAZOLIN Has had a PCN reaction causing, facial/tongue/throat swelling, SOB or lightheadedness with hypotension: NO Has had a PCN reaction causing severe rash involving mucus membranes or skin necrosis: NO Has had a PCN reaction that required hospitalization: No Has had a PCN reaction occurring within the last 10 years: Yes If all of the above answers are "NO", then may proceed with Cephalosporin use. Updated 01/06/20; Reaction was low severity rash. Ancef given. No reaction     Family History: Family History  Problem Relation Age of Onset   Cancer Mother        breast   Stroke Mother    Diabetes Father    Hypertension  Father    Heart disease Maternal Grandfather    Dementia Paternal Grandmother    Bladder Cancer Neg Hx    Kidney cancer Neg Hx     Social History:   reports that she has never smoked. She has never used smokeless tobacco. She reports that she does not currently use alcohol. She reports that she does not use drugs.  Physical Exam: BP (!) 153/79   Pulse 81   Ht 5' 5"  (1.651 m)   Wt (!) 320 lb (145.2 kg)   BMI 53.25 kg/m   Constitutional:  Alert and oriented, no acute distress, nontoxic appearing HEENT: [place], AT Cardiovascular: No clubbing, cyanosis, or edema Respiratory: Normal respiratory effort, no increased work of breathing Skin: No rashes, bruises or suspicious lesions Neurologic: Grossly intact, no focal deficits, moving all 4 extremities Psychiatric: Normal mood and affect  Pertinent Imaging: Results for orders placed during the hospital encounter of 11/28/21  US RENAL  Narrative CLINICAL DATA:  Hydronephrosis.  Prior stent placed.  EXAM: RENAL / URINARY TRACT ULTRASOUND COMPLETE  COMPARISON:  Renal ultrasound September 29, 2021  FINDINGS: Right Kidney:  Renal measurements: 15.0 x 4.8 x 5.5 cm = volume: 208.5 mL. Echogenicity within normal limits. No mass or hydronephrosis visualized.  Left  Kidney:  Renal measurements: 15.5 x 5.9 x 5.7 cm = volume: 273.4 mL. Echogenicity within normal limits. No mass or hydronephrosis visualized.  Bladder:  Appears normal for degree of bladder distention.  Other:  None.  IMPRESSION: No hydronephrosis.   Electronically Signed By: Lovey Newcomer M.D. On: 11/29/2021 06:18  I personally reviewed the images referenced above and note no hydronephrosis.  Assessment & Plan:   1. Left renal stone Mild left hydronephrosis has resolved.  No further intervention indicated.  She is asymptomatic today. - Litholink Serum Panel - Litholink 24Hr Urine Panel - Urinalysis, Complete  Return in about 4 weeks (around 01/03/2022) for  LithoLink results (virtual visit).  Debroah Loop, PA-C  [facility] [address] [place], [place] [address] [phone]

## 2021-12-07 LAB — LITHOLINK SERUM PANEL
CO2: 21 mmol/L (ref 20–29)
Calcium: 9.2 mg/dL (ref 8.7–10.2)
Chloride: 102 mmol/L (ref 96–106)
Creatinine, Ser: 0.56 mg/dL — ABNORMAL LOW (ref 0.57–1.00)
Magnesium: 2.9 mg/dL — ABNORMAL HIGH (ref 1.6–2.3)
Phosphorus: 3.6 mg/dL (ref 3.0–4.3)
Potassium: 4.4 mmol/L (ref 3.5–5.2)
Sodium: 142 mmol/L (ref 134–144)
Uric Acid: 4.7 mg/dL (ref 2.6–6.2)
eGFR: 122 mL/min/{1.73_m2} (ref >59)

## 2021-12-08 LAB — LITHOLINK SERUM PANEL
CO2: 22 mmol/L (ref 20–29)
Calcium: 9.2 mg/dL (ref 8.7–10.2)
Chloride: 102 mmol/L (ref 96–106)
Creatinine, Ser: 0.58 mg/dL (ref 0.57–1.00)
Magnesium: 1.8 mg/dL (ref 1.6–2.3)
Phosphorus: 3.8 mg/dL (ref 3.0–4.3)
Potassium: 4.4 mmol/L (ref 3.5–5.2)
Sodium: 139 mmol/L (ref 134–144)
Uric Acid: 4.9 mg/dL (ref 2.6–6.2)
eGFR: 121 mL/min/{1.73_m2} (ref >59)

## 2021-12-10 LAB — LITHOLINK 24HR URINE PANEL
Ammonium, Urine: 50 mmol/24 hr (ref 15–60)
Calcium Oxalate Saturation: 7.65 (ref 6.00–10.00)
Calcium Phosphate Saturation: 0.8 (ref 0.50–2.00)
Calcium, Urine: 213 mg/24 hr — ABNORMAL HIGH (ref <200)
Calcium/Creatinine Ratio: 110 mg/g creat (ref 51–262)
Calcium/Kg Body Weight: 1.5 mg/24 hr/kg (ref <4.0)
Chloride, Urine: 247 mmol/24 hr (ref 70–250)
Citrate, Urine: 700 mg/24 hr (ref >550)
Creatinine, Urine: 1943 mg/24 hr
Creatinine/Kg Body Weight: 13.4 mg/24 hr/kg (ref 8.7–20.3)
Cystine, Urine, Qualitative: NEGATIVE
Magnesium, Urine: 61 mg/24 hr (ref 30–120)
Oxalate, Urine: 37 mg/24 hr (ref 20–40)
Phosphorus, Urine: 1916 mg/24 hr — ABNORMAL HIGH (ref 600–1200)
Potassium, Urine: 59 mmol/24 hr (ref 20–100)
Protein Catabolic Rate: 0.8 g/kg/24 hr (ref 0.8–1.4)
Sodium, Urine: 288 mmol/24 hr — ABNORMAL HIGH (ref 50–150)
Sulfate, Urine: 64 meq/24 hr (ref 20–80)
Urea Nitrogen, Urine: 14.28 g/24 hr — ABNORMAL HIGH (ref 6.00–14.00)
Uric Acid Saturation: 4.3 — ABNORMAL HIGH (ref <1.00)
Uric Acid, Urine: 1199 mg/24 hr — ABNORMAL HIGH (ref <750)
Urine Volume (Preserved): 1360 mL/24 hr (ref 500–4000)
pH, 24 hr, Urine: 5.431 — ABNORMAL LOW (ref 5.800–6.200)

## 2022-01-11 DIAGNOSIS — M9903 Segmental and somatic dysfunction of lumbar region: Secondary | ICD-10-CM | POA: Diagnosis not present

## 2022-01-11 DIAGNOSIS — M545 Low back pain, unspecified: Secondary | ICD-10-CM | POA: Diagnosis not present

## 2022-01-11 DIAGNOSIS — S336XXA Sprain of sacroiliac joint, initial encounter: Secondary | ICD-10-CM | POA: Diagnosis not present

## 2022-01-11 DIAGNOSIS — M9904 Segmental and somatic dysfunction of sacral region: Secondary | ICD-10-CM | POA: Diagnosis not present

## 2022-01-12 ENCOUNTER — Ambulatory Visit (INDEPENDENT_AMBULATORY_CARE_PROVIDER_SITE_OTHER): Payer: BC Managed Care – PPO | Admitting: Physician Assistant

## 2022-01-12 VITALS — BP 166/98 | HR 92 | Ht 65.0 in | Wt 320.0 lb

## 2022-01-12 DIAGNOSIS — R3129 Other microscopic hematuria: Secondary | ICD-10-CM | POA: Diagnosis not present

## 2022-01-12 DIAGNOSIS — Z87442 Personal history of urinary calculi: Secondary | ICD-10-CM

## 2022-01-12 LAB — URINALYSIS, COMPLETE
Bilirubin, UA: NEGATIVE
Glucose, UA: NEGATIVE
Ketones, UA: NEGATIVE
Leukocytes,UA: NEGATIVE
Nitrite, UA: NEGATIVE
Specific Gravity, UA: 1.03 (ref 1.005–1.030)
Urobilinogen, Ur: 0.2 mg/dL (ref 0.2–1.0)
pH, UA: 5 (ref 5.0–7.5)

## 2022-01-12 LAB — MICROSCOPIC EXAMINATION

## 2022-01-12 NOTE — Progress Notes (Signed)
01/12/2022 12:03 PM   Diana Sexton [birth year] [id number]  CC: Chief Complaint  Patient presents with   Nephrolithiasis   HPI: Diana Sexton is a 36 y.o. female with PMH nephrolithiasis who underwent left ureteroscopy in July 2023 who presents today for Litholink results and follow-up of microscopic hematuria.  She is accompanied today by her husband.  Today she reports no acute concerns.  She began menstruating about 1 week after her most recent visit with me.  At that visit, she was noted to have microscopic hematuria with dysmorphic red cells on urine microscopy, though her urine sample appeared contaminated.  Stone analysis from earlier this year has resulted with 60% calcium oxalate monohydrate, 40% calcium oxalate dihydrate.  She completed a metabolic workup, with notable findings as follows: -Low urine volume, 1.36L -Severe hyperuricosuria -Low urine pH -Borderline hypercalciuria -Borderline hyperoxaluria  In-office UA today positive for trace intact blood and 2+ protein; urine microscopy with moderate bacteria.  PMH: Past Medical History:  Diagnosis Date   Anemia    Anxiety    Cellulitis 2012   caused stillbirth   Deep vein thrombosis (DVT) of right lower extremity ([place]) 02/04/2020   a.) s/p hernia surgery 01/06/2020 and D&C 01/27/2020 --> DVT (+) 02/04/2020 --> occlusive thrombus in the RIGHT posterior tibial vein; superficial thrombus in the greater saphenous extending from upper calf to distal RIGHT thigh   Deep vein thrombosis (DVT) of right upper extremity ([place]) 07/10/2011   a.) occurred in setting of pregnancy --> thrombus noted RIGHT subclavian (occlusive), axillary, and brachial (occlusive) veins   Deep vein thrombosis (DVT) of right upper extremity ([place]) 02/08/2018   a.) occlusive thrombus in the basilic vein and one of the brachial veins   Dysmenorrhea    Family history of breast cancer 11/2013   BRCA/MyRisk neg   Headache    related to hormonal  meds   Hernia, abdominal    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[place]) 02/09/2018   a.) hypercoagulable workup (factor V, ATIII, protein C, protein S, lupus anticoagulant, and anticardiolipin Ab) otherwise NEGATIVE   History of kidney stones    Increased risk of breast cancer 11/2013   IBIS=27%   Obesity    Personal history of noncompliance with medical treatment    a.) 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); 3 seperate DVTs; advised of lifelong anticoagulation therapy, however noncompliant increasing risk of subsequent thrombus/emobolus   Ventral hernia     Surgical History: Past Surgical History:  Procedure Laterality Date   CHOLECYSTECTOMY  2007   CYSTOSCOPY W/ RETROGRADES Left 02/02/2017   Procedure: CYSTOSCOPY WITH RETROGRADE PYELOGRAM;  Surgeon: Abbie Sons, MD;  Location: [facility];  Service: Urology;  Laterality: Left;   CYSTOSCOPY W/ RETROGRADES Right 06/26/2019   Procedure: CYSTOSCOPY WITH RETROGRADE PYELOGRAM;  Surgeon: Abbie Sons, MD;  Location: [facility];  Service: Urology;  Laterality: Right;   CYSTOSCOPY/URETEROSCOPY/HOLMIUM LASER/STENT PLACEMENT Left 02/02/2017   Procedure: CYSTOSCOPY/URETEROSCOPY/HOLMIUM LASER/STENT PLACEMENT;  Surgeon: Abbie Sons, MD;  Location: [facility];  Service: Urology;  Laterality: Left;   CYSTOSCOPY/URETEROSCOPY/HOLMIUM LASER/STENT PLACEMENT Right 06/26/2019   Procedure: CYSTOSCOPY/URETEROSCOPY/HOLMIUM LASER/STENT PLACEMENT;  Surgeon: Abbie Sons, MD;  Location: [facility];  Service: Urology;  Laterality: Right;   CYSTOSCOPY/URETEROSCOPY/HOLMIUM LASER/STENT PLACEMENT Left 09/06/2021   Procedure: CYSTOSCOPY/URETEROSCOPY/HOLMIUM LASER/STENT PLACEMENT;  Surgeon: Abbie Sons, MD;  Location: [facility];  Service: Urology;  Laterality: Left;   DILITATION & CURRETTAGE/HYSTROSCOPY WITH ESSURE     DILITATION & CURRETTAGE/HYSTROSCOPY WITH NOVASURE ABLATION N/A 12/24/2018  Procedure: DILATATION & CURETTAGE/HYSTEROSCOPY WITH ENDOMETRIAL  ABLATION ([place]);  Surgeon: Will Bonnet, MD;  Location: [facility];  Service: Gynecology;  Laterality: N/A;   HYSTEROSCOPY WITH D & C N/A 01/27/2020   Procedure: DILATATION AND CURETTAGE /HYSTEROSCOPY;  Surgeon: Will Bonnet, MD;  Location: [facility];  Service: Gynecology;  Laterality: N/A;   INTRAUTERINE DEVICE (IUD) INSERTION     TONSILLECTOMY AND ADENOIDECTOMY     TUBAL LIGATION  2014   XI ROBOTIC ASSISTED VENTRAL HERNIA N/A 01/06/2020   Procedure: XI ROBOTIC ASSISTED VENTRAL HERNIA;  Surgeon: Olean Ree, MD;  Location: [facility];  Service: General;  Laterality: N/A;    Home Medications:  Allergies as of 01/12/2022       Reactions   Oxycodone Rash   Penicillins Rash   TOLERATED CEFAZOLIN Has had a PCN reaction causing, facial/tongue/throat swelling, SOB or lightheadedness with hypotension: NO Has had a PCN reaction causing severe rash involving mucus membranes or skin necrosis: NO Has had a PCN reaction that required hospitalization: No Has had a PCN reaction occurring within the last 10 years: Yes If all of the above answers are "NO", then may proceed with Cephalosporin use. Updated 01/06/20; Reaction was low severity rash. Ancef given. No reaction        Medication List    as of January 12, 2022 12:03 PM   You have not been prescribed any medications.     Allergies:  Allergies  Allergen Reactions   Oxycodone Rash   Penicillins Rash    TOLERATED CEFAZOLIN Has had a PCN reaction causing, facial/tongue/throat swelling, SOB or lightheadedness with hypotension: NO Has had a PCN reaction causing severe rash involving mucus membranes or skin necrosis: NO Has had a PCN reaction that required hospitalization: No Has had a PCN reaction occurring within the last 10 years: Yes If all of the above answers are "NO", then may proceed with Cephalosporin use. Updated 01/06/20; Reaction was low severity rash. Ancef given. No reaction     Family  History: Family History  Problem Relation Age of Onset   Cancer Mother        breast   Stroke Mother    Diabetes Father    Hypertension Father    Heart disease Maternal Grandfather    Dementia Paternal Grandmother    Bladder Cancer Neg Hx    Kidney cancer Neg Hx     Social History:   reports that she has never smoked. She has never used smokeless tobacco. She reports that she does not currently use alcohol. She reports that she does not use drugs.  Physical Exam: BP (!) 166/98   Pulse 92   Ht _0  (1.651 m)   Wt (!) 320 lb (145.2 kg)   BMI 53.25 kg/m   Constitutional:  Alert and oriented, no acute distress, nontoxic appearing HEENT: [place], AT Cardiovascular: No clubbing, cyanosis, or edema Respiratory: Normal respiratory effort, no increased work of breathing Skin: No rashes, bruises or suspicious lesions Neurologic: Grossly intact, no focal deficits, moving all 4 extremities Psychiatric: Normal mood and affect  Laboratory Data: Results for orders placed or performed in visit on 01/12/22  Microscopic Examination   Urine  Result Value Ref Range   WBC, UA 0-5 0 - 5 /hpf   RBC, Urine 0-2 0 - 2 /hpf   Epithelial Cells (non renal) 0-10 0 - 10 /hpf   Casts Present (A) None seen /lpf   Cast Type Hyaline casts N/A   Mucus, UA  Present (A) Not Estab.   Bacteria, UA Moderate (A) None seen/Few  Urinalysis, Complete  Result Value Ref Range   Specific Gravity, UA 1.030 1.005 - 1.030   pH, UA 5.0 5.0 - 7.5   Color, UA Yellow Yellow   Appearance Ur Clear Clear   Leukocytes,UA Negative Negative   Protein,UA 2+ (A) Negative/Trace   Glucose, UA Negative Negative   Ketones, UA Negative Negative   RBC, UA Trace (A) Negative   Bilirubin, UA Negative Negative   Urobilinogen, Ur 0.2 0.2 - 1.0 mg/dL   Nitrite, UA Negative Negative   Microscopic Examination See below:    Assessment & Plan:   1. History of nephrolithiasis We discussed general stone recommendations today and I gave  her verbal and written resources. Based on her metabolic workup, I recommended starting with dietary changes including increasing PO hydration, decreasing dietary sodium, decreasing animal protein intake, decreasing dietary oxalate, and maintaining moderate dietary calcium. We discussed consideration of starting thiazide diuretics and/or potassium citrate, but she struggles to take medication consistently and wishes to focus on dietary factors for now, which is reasonable. Will plan for repeat LithoLink in ~6 months and consider meds/supplements in the future based on repeat results.  2. Microscopic hematuria Resolved, no further intervention indicated. - Urinalysis, Complete  Return in about 6 months (around 07/13/2022) for Repeat Litholink; will schedule results visit when report is available.  Debroah Loop, PA-C  [facility] [address], [place], [place] [address] [phone]

## 2022-01-12 NOTE — Patient Instructions (Addendum)
Today we discussed making the following changes to reduce your stone risk: -Increasing fluid intake to 80-100 oz daily. Your best choices are water, lemonade, and citric acid-containing sodas. Please try to limit or avoid teas and phosphoric acid-containing sodas. -Reducing dietary sodium -Keeping your daily animal protein intake to less than 1.3 g/kg of body weight/day (for you, this is 188g of protein daily). -Reducing dietary oxalate intake, see the resources I provided you today -Maintaining a moderate (1000-'1200mg'$ /day) amount of dietary calcium/day.  We will plan to repeat a metabolic workup in about 6 months; the materials will be shipped to your home at that time and I'll call you to schedule a results visit when I get your report back. In the future, we may consider adding on medications or supplements to reduce your stone risk further.

## 2022-01-13 DIAGNOSIS — M9904 Segmental and somatic dysfunction of sacral region: Secondary | ICD-10-CM | POA: Diagnosis not present

## 2022-01-13 DIAGNOSIS — M9903 Segmental and somatic dysfunction of lumbar region: Secondary | ICD-10-CM | POA: Diagnosis not present

## 2022-01-13 DIAGNOSIS — S336XXA Sprain of sacroiliac joint, initial encounter: Secondary | ICD-10-CM | POA: Diagnosis not present

## 2022-01-13 DIAGNOSIS — M545 Low back pain, unspecified: Secondary | ICD-10-CM | POA: Diagnosis not present

## 2022-01-16 DIAGNOSIS — M9903 Segmental and somatic dysfunction of lumbar region: Secondary | ICD-10-CM | POA: Diagnosis not present

## 2022-01-16 DIAGNOSIS — M545 Low back pain, unspecified: Secondary | ICD-10-CM | POA: Diagnosis not present

## 2022-01-16 DIAGNOSIS — S336XXA Sprain of sacroiliac joint, initial encounter: Secondary | ICD-10-CM | POA: Diagnosis not present

## 2022-01-16 DIAGNOSIS — M9904 Segmental and somatic dysfunction of sacral region: Secondary | ICD-10-CM | POA: Diagnosis not present

## 2022-01-18 DIAGNOSIS — M9904 Segmental and somatic dysfunction of sacral region: Secondary | ICD-10-CM | POA: Diagnosis not present

## 2022-01-18 DIAGNOSIS — M545 Low back pain, unspecified: Secondary | ICD-10-CM | POA: Diagnosis not present

## 2022-01-18 DIAGNOSIS — S336XXA Sprain of sacroiliac joint, initial encounter: Secondary | ICD-10-CM | POA: Diagnosis not present

## 2022-01-18 DIAGNOSIS — M9903 Segmental and somatic dysfunction of lumbar region: Secondary | ICD-10-CM | POA: Diagnosis not present

## 2022-01-23 DIAGNOSIS — S336XXA Sprain of sacroiliac joint, initial encounter: Secondary | ICD-10-CM | POA: Diagnosis not present

## 2022-01-23 DIAGNOSIS — M545 Low back pain, unspecified: Secondary | ICD-10-CM | POA: Diagnosis not present

## 2022-01-23 DIAGNOSIS — M9904 Segmental and somatic dysfunction of sacral region: Secondary | ICD-10-CM | POA: Diagnosis not present

## 2022-01-23 DIAGNOSIS — M9903 Segmental and somatic dysfunction of lumbar region: Secondary | ICD-10-CM | POA: Diagnosis not present

## 2022-01-25 DIAGNOSIS — M9904 Segmental and somatic dysfunction of sacral region: Secondary | ICD-10-CM | POA: Diagnosis not present

## 2022-01-25 DIAGNOSIS — M545 Low back pain, unspecified: Secondary | ICD-10-CM | POA: Diagnosis not present

## 2022-01-25 DIAGNOSIS — S336XXA Sprain of sacroiliac joint, initial encounter: Secondary | ICD-10-CM | POA: Diagnosis not present

## 2022-01-25 DIAGNOSIS — M9903 Segmental and somatic dysfunction of lumbar region: Secondary | ICD-10-CM | POA: Diagnosis not present

## 2022-01-31 DIAGNOSIS — S336XXA Sprain of sacroiliac joint, initial encounter: Secondary | ICD-10-CM | POA: Diagnosis not present

## 2022-01-31 DIAGNOSIS — M9904 Segmental and somatic dysfunction of sacral region: Secondary | ICD-10-CM | POA: Diagnosis not present

## 2022-01-31 DIAGNOSIS — M9903 Segmental and somatic dysfunction of lumbar region: Secondary | ICD-10-CM | POA: Diagnosis not present

## 2022-01-31 DIAGNOSIS — M545 Low back pain, unspecified: Secondary | ICD-10-CM | POA: Diagnosis not present

## 2022-03-20 ENCOUNTER — Ambulatory Visit: Payer: BC Managed Care – PPO | Admitting: Urology

## 2022-03-24 ENCOUNTER — Ambulatory Visit
Admission: EM | Admit: 2022-03-24 | Discharge: 2022-03-24 | Disposition: A | Discharge status: Home / Self Care | Payer: BC Managed Care – PPO | Attending: Family Medicine | Admitting: Family Medicine

## 2022-03-24 ENCOUNTER — Other Ambulatory Visit: Payer: Self-pay

## 2022-03-24 DIAGNOSIS — Z1152 Encounter for screening for COVID-19: Secondary | ICD-10-CM | POA: Insufficient documentation

## 2022-03-24 DIAGNOSIS — J101 Influenza due to other identified influenza virus with other respiratory manifestations: Secondary | ICD-10-CM

## 2022-03-24 LAB — RAPID INFLUENZA A&B ANTIGENS
Influenza A (ARMC): NEGATIVE
Influenza B (ARMC): POSITIVE — AB

## 2022-03-24 LAB — SARS CORONAVIRUS 2 BY RT PCR: SARS Coronavirus 2 by RT PCR: NEGATIVE

## 2022-03-24 MED ORDER — LIDOCAINE VISCOUS HCL 2 % MT SOLN: 15.0000 mL | OROMUCOSAL | 0 refills | Status: DC | PRN

## 2022-03-24 MED ORDER — ONDANSETRON 4 MG PO TBDP: 4.0000 mg | ORAL_TABLET | Freq: Three times a day (TID) | ORAL | 0 refills | Status: DC | PRN

## 2022-03-24 MED ORDER — CHERATUSSIN AC 100-10 MG/5ML PO SOLN: 5.0000 mL | Freq: Three times a day (TID) | ORAL | 0 refills | Status: DC | PRN

## 2022-03-24 NOTE — Discharge Instructions (Addendum)
Your influenza B test is positive. Your COVID test is negative. You can take Tylenol and/or Ibuprofen as needed for fever reduction and pain relief.    For cough: honey 1/2 to 1 teaspoon (you can dilute the honey in water or another fluid).  You can also use guaifenesin and dextromethorphan for cough. You can use a humidifier for chest congestion and cough.  If you don't have a humidifier, you can sit in the bathroom with the hot shower running.      For sore throat: try warm salt water gargles, Mucinex sore throat cough drops or cepacol lozenges, throat spray, warm tea or water with lemon/honey, popsicles or ice, or OTC cold relief medicine for throat discomfort. You can also purchase chloraseptic spray at the pharmacy or dollar store.   For congestion: take a daily anti-histamine like Zyrtec, Claritin, and a oral decongestant, such as pseudoephedrine.  You can also use Flonase 1-2 sprays in each nostril daily. Afrin is also a good option, if you do not have high blood pressure.    It is important to stay hydrated: drink plenty of fluids (water, gatorade/powerade/pedialyte, juices, or teas) to keep your throat moisturized and help further relieve irritation/discomfort.    Return or go to the Emergency Department if symptoms worsen or do not improve in the next few days

## 2022-03-24 NOTE — ED Provider Notes (Signed)
[facility]    CSN: [id number] Arrival date & time: 03/24/22  1652      History   Chief Complaint Chief Complaint  Patient presents with   Facial Pain   Otalgia    HPI Diana Sexton is a 37 y.o. female.   HPI   Diana Sexton presents for nasal congestion, cough, body aches and fever. Tmax 100.8 F.  Has ear pain and trouble sleeping due to cough. She vomited after a coughing fit.  No known sick contacts but has children that attend school.    Fever : yes Chills: yes Sore throat: yes Cough:yes  Sputum:yes  Nasal congestion : yes Rhinorrhea: yes Myalgias: yes Appetite: decreased Hydration: normal  Abdominal pain: no Nausea: no Vomiting: no Diarrhea: No Rash: No Sleep disturbance: no Headache: no      Past Medical History:  Diagnosis Date   Anemia    Anxiety    Cellulitis 2012   caused stillbirth   Deep vein thrombosis (DVT) of right lower extremity ([place]) 02/04/2020   a.) s/p hernia surgery 01/06/2020 and D&C 01/27/2020 --> DVT (+) 02/04/2020 --> occlusive thrombus in the RIGHT posterior tibial vein; superficial thrombus in the greater saphenous extending from upper calf to distal RIGHT thigh   Deep vein thrombosis (DVT) of right upper extremity ([place]) 07/10/2011   a.) occurred in setting of pregnancy --> thrombus noted RIGHT subclavian (occlusive), axillary, and brachial (occlusive) veins   Deep vein thrombosis (DVT) of right upper extremity ([place]) 02/08/2018   a.) occlusive thrombus in the basilic vein and one of the brachial veins   Dysmenorrhea    Family history of breast cancer 11/2013   BRCA/MyRisk neg   Headache    related to hormonal meds   Hernia, abdominal    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[place]) 02/09/2018   a.) hypercoagulable workup (factor V, ATIII, protein C, protein S, lupus anticoagulant, and anticardiolipin Ab) otherwise NEGATIVE   History of kidney stones    Increased risk of breast cancer 11/2013   IBIS=27%    Obesity    Personal history of noncompliance with medical treatment    a.) 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); 3 seperate DVTs; advised of lifelong anticoagulation therapy, however noncompliant increasing risk of subsequent thrombus/emobolus   Ventral hernia     Patient Active Problem List   Diagnosis Date Noted   IFG (impaired fasting glucose) 02/14/2020   Acute pelvic pain, female 01/27/2020   Hematometra 01/27/2020   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   Ventral hernia without obstruction or gangrene    DVT (deep venous thrombosis) ([place]) 02/09/2018   Menorrhagia with irregular cycle 10/13/2016   Other irritable bowel syndrome 05/02/2016   Anxiety and depression 01/21/2015   Nephrolithiasis 01/15/2015   Elevated LDL cholesterol level 01/01/2015   Insomnia 12/16/2014   Morbid obesity ([place]) 12/11/2014   Low back pain 10/13/2014   Prothrombin gene mutation ([place]) 10/13/2014    Past Surgical History:  Procedure Laterality Date   CHOLECYSTECTOMY  2007   CYSTOSCOPY W/ RETROGRADES Left 02/02/2017   Procedure: CYSTOSCOPY WITH RETROGRADE PYELOGRAM;  Surgeon: Abbie Sons, MD;  Location: [facility];  Service: Urology;  Laterality: Left;   CYSTOSCOPY W/ RETROGRADES Right 06/26/2019   Procedure: CYSTOSCOPY WITH RETROGRADE PYELOGRAM;  Surgeon: Abbie Sons, MD;  Location: [facility];  Service: Urology;  Laterality: Right;   CYSTOSCOPY/URETEROSCOPY/HOLMIUM LASER/STENT PLACEMENT Left 02/02/2017   Procedure: CYSTOSCOPY/URETEROSCOPY/HOLMIUM LASER/STENT PLACEMENT;  Surgeon: Abbie Sons, MD;  Location: [facility];  Service: Urology;  Laterality: Left;   CYSTOSCOPY/URETEROSCOPY/HOLMIUM LASER/STENT PLACEMENT Right 06/26/2019   Procedure: CYSTOSCOPY/URETEROSCOPY/HOLMIUM LASER/STENT PLACEMENT;  Surgeon: Abbie Sons, MD;  Location: [facility];  Service: Urology;  Laterality: Right;   CYSTOSCOPY/URETEROSCOPY/HOLMIUM LASER/STENT PLACEMENT Left 09/06/2021   Procedure:  CYSTOSCOPY/URETEROSCOPY/HOLMIUM LASER/STENT PLACEMENT;  Surgeon: Abbie Sons, MD;  Location: [facility];  Service: Urology;  Laterality: Left;   DILITATION & CURRETTAGE/HYSTROSCOPY WITH ESSURE     DILITATION & CURRETTAGE/HYSTROSCOPY WITH NOVASURE ABLATION N/A 12/24/2018   Procedure: DILATATION & CURETTAGE/HYSTEROSCOPY WITH ENDOMETRIAL ABLATION (MINERVA SYSTEM);  Surgeon: Will Bonnet, MD;  Location: [facility];  Service: Gynecology;  Laterality: N/A;   HYSTEROSCOPY WITH D & C N/A 01/27/2020   Procedure: DILATATION AND CURETTAGE /HYSTEROSCOPY;  Surgeon: Will Bonnet, MD;  Location: [facility];  Service: Gynecology;  Laterality: N/A;   INTRAUTERINE DEVICE (IUD) INSERTION     TONSILLECTOMY AND ADENOIDECTOMY     TUBAL LIGATION  2014   XI ROBOTIC ASSISTED VENTRAL HERNIA N/A 01/06/2020   Procedure: XI ROBOTIC ASSISTED VENTRAL HERNIA;  Surgeon: Olean Ree, MD;  Location: [facility];  Service: General;  Laterality: N/A;    OB History     Gravida  5   Para  2   Term  2   Preterm      AB  3   Living  2      SAB  3   IAB      Ectopic      Multiple      Live Births  2            Home Medications    Prior to Admission medications   Medication Sig Start Date End Date Taking? Authorizing Provider  guaiFENesin-codeine (CHERATUSSIN AC) 100-10 MG/5ML syrup Take 5 mLs by mouth 3 (three) times daily as needed. 03/24/22  Yes Meir Elwood, DO  lidocaine (XYLOCAINE) 2 % solution Use as directed 15 mLs in the mouth or throat every 4 (four) hours as needed for mouth pain. 03/24/22  Yes Lenorris Karger, DO  ondansetron (ZOFRAN-ODT) 4 MG disintegrating tablet Take 1 tablet (4 mg total) by mouth every 8 (eight) hours as needed. 03/24/22  Yes Laya Letendre, DO  enoxaparin (LOVENOX) 40 MG/0.4ML injection Inject 0.4 mLs (40 mg total) into the skin every 12 (twelve) hours for 3 days. 01/27/20 02/04/20  Will Bonnet, MD    Family History Family History  Problem Relation Age  of Onset   Cancer Mother        breast   Stroke Mother    Diabetes Father    Hypertension Father    Heart disease Maternal Grandfather    Dementia Paternal Grandmother    Bladder Cancer Neg Hx    Kidney cancer Neg Hx     Social History Social History   Tobacco Use   Smoking status: Never   Smokeless tobacco: Never  Vaping Use   Vaping Use: Never used  Substance Use Topics   Alcohol use: Yes    Comment: 1 drink per month   Drug use: No     Allergies   Oxycodone and Penicillins   Review of Systems Review of Systems: negative unless otherwise stated in HPI.      Physical Exam Triage Vital Signs ED Triage Vitals  Enc Vitals Group     BP 03/24/22 1704 (!) 146/79     Pulse Rate 03/24/22 1704 82     Resp 03/24/22 1704 20     Temp 03/24/22 1704 98.6 F (  37 C)     Temp Source 03/24/22 1704 Oral     SpO2 03/24/22 1704 98 %     Weight 03/24/22 1701 (!) 322 lb 12.8 oz (146.4 kg)     Height 03/24/22 1701 5' 5"$  (1.651 m)     Head Circumference --      Peak Flow --      Pain Score 03/24/22 1701 5     Pain Loc --      Pain Edu? --      Excl. in [place]? --    No data found.  Updated Vital Signs BP (!) 146/79 (BP Location: Left Arm)   Pulse 82   Temp 98.6 F (37 C) (Oral)   Resp 20   Ht 5' 5"$  (1.651 m)   Wt (!) 146.4 kg   SpO2 98%   BMI 53.72 kg/m   Visual Acuity Right Eye Distance:   Left Eye Distance:   Bilateral Distance:    Right Eye Near:   Left Eye Near:    Bilateral Near:     Physical Exam GEN:     alert, ill but non-toxic appearing female in no distress    HENT:  mucus membranes moist, oropharyngeal without lesions or exudate, no tonsillar hypertrophy,  mild oropharyngeal erythema, moderate erythematous edematous turbinates, clear nasal discharge, bilateral TM normal EYES:   pupils equal and reactive, no scleral injection or discharge NECK:  normal ROM, no lymphadenopathy, no meningismus   RESP:  no increased work of breathing, clear to  auscultation bilaterally CVS:   regular rate and rhythm Skin:   warm and dry    UC Treatments / Results  Labs (all labs ordered are listed, but only abnormal results are displayed) Labs Reviewed  RAPID INFLUENZA A&B ANTIGENS - Abnormal; Notable for the following components:      Result Value   Influenza B ([facility]) POSITIVE (*)    All other components within normal limits  SARS CORONAVIRUS 2 BY RT PCR    EKG   Radiology No results found.  Procedures Procedures (including critical care time)  Medications Ordered in UC Medications - No data to display  Initial Impression / Assessment and Plan / UC Course  I have reviewed the triage vital signs and the nursing notes.  Pertinent labs & imaging results that were available during my care of the patient were reviewed by me and considered in my medical decision making (see chart for details).       Pt is a 37 y.o. female who presents for 3 days of respiratory symptoms. Diana Sexton is afebrile here. Satting well on room air. Overall pt is non-toxic appearing, well hydrated, without respiratory distress. Pulmonary exam is unremarkable.  COVID and influenza testing obtained and was influenza B positive. Discussed symptomatic treatment. She is outside the window for Tamiflu.  Given Cheratussin for cough and lidocaine solution for sore throat to allow her the ability to eat and drink.  Zofran for nausea. Typical duration of symptoms discussed.   Return and ED precautions given and voiced understanding. Discussed MDM, treatment plan and plan for follow-up with patient and husband who agree with plan.     Final Clinical Impressions(s) / UC Diagnoses   Final diagnoses:  Influenza B     Discharge Instructions      Your influenza B test is positive. Your COVID test is negative. You can take Tylenol and/or Ibuprofen as needed for fever reduction and pain relief.    For cough: honey  1/2 to 1 teaspoon (you can dilute the honey in water or  another fluid).  You can also use guaifenesin and dextromethorphan for cough. You can use a humidifier for chest congestion and cough.  If you don't have a humidifier, you can sit in the bathroom with the hot shower running.      For sore throat: try warm salt water gargles, Mucinex sore throat cough drops or cepacol lozenges, throat spray, warm tea or water with lemon/honey, popsicles or ice, or OTC cold relief medicine for throat discomfort. You can also purchase chloraseptic spray at the pharmacy or dollar store.   For congestion: take a daily anti-histamine like Zyrtec, Claritin, and a oral decongestant, such as pseudoephedrine.  You can also use Flonase 1-2 sprays in each nostril daily. Afrin is also a good option, if you do not have high blood pressure.    It is important to stay hydrated: drink plenty of fluids (water, gatorade/powerade/pedialyte, juices, or teas) to keep your throat moisturized and help further relieve irritation/discomfort.    Return or go to the Emergency Department if symptoms worsen or do not improve in the next few days      ED Prescriptions     Medication Sig Dispense Auth. Provider   ondansetron (ZOFRAN-ODT) 4 MG disintegrating tablet Take 1 tablet (4 mg total) by mouth every 8 (eight) hours as needed. 20 tablet Diana Cabanilla, DO   lidocaine (XYLOCAINE) 2 % solution Use as directed 15 mLs in the mouth or throat every 4 (four) hours as needed for mouth pain. 100 mL Diana Cake, DO   guaiFENesin-codeine (CHERATUSSIN AC) 100-10 MG/5ML syrup Take 5 mLs by mouth 3 (three) times daily as needed. 120 mL Diana Sexton, Ronnette Juniper, DO      I have reviewed the PDMP during this encounter.   Lyndee Hensen, DO 03/24/22 1808

## 2022-03-24 NOTE — ED Triage Notes (Addendum)
Complains of generalized body aches, frontal headache, coughing up green phlegm, otalgia bilaterally. Sx for 3 days

## 2022-03-31 ENCOUNTER — Encounter: Payer: Self-pay | Admitting: Physician Assistant

## 2022-03-31 ENCOUNTER — Ambulatory Visit: Payer: BC Managed Care – PPO | Admitting: Physician Assistant

## 2022-03-31 VITALS — BP 125/70 | HR 73 | Temp 97.8°F | Ht 65.0 in | Wt 323.5 lb

## 2022-03-31 DIAGNOSIS — H6993 Unspecified Eustachian tube disorder, bilateral: Secondary | ICD-10-CM | POA: Diagnosis not present

## 2022-03-31 DIAGNOSIS — H6592 Unspecified nonsuppurative otitis media, left ear: Secondary | ICD-10-CM

## 2022-03-31 NOTE — Progress Notes (Signed)
Acute Office Visit   Patient: Diana Sexton   DOB: 07-Aug-1985   37 y.o. Female  MRN: [id number] Visit Date: 03/31/2022  Today's healthcare provider: Dani Gobble Angeliyah Kirkey, PA-C  Introduced myself to the patient as a Journalist, newspaper and provided education on APPs in clinical practice.    Chief Complaint  Patient presents with   Ear Problem    Patient says she had the Flu last week and treated. Patient says she is noticing some muffled sounds and says her L ear is clogged up.    Subjective    HPI HPI     Ear Problem    Additional comments: Patient says she had the Flu last week and treated. Patient says she is noticing some muffled sounds and says her L ear is clogged up.       Last edited by Irena Reichmann, CMA on 03/31/2022  8:21 AM.       Reports fullness in ear  Onset: sudden  Duration: last Thursday/Friday  Reports she was sick last week and her ear was hurting but the pain has resolved Reports lingering muffled sound and sensation of fullness in the left ear  Interventions: has been using Flonase to help with congestion    Medications: Outpatient Medications Prior to Visit  Medication Sig   guaiFENesin-codeine (CHERATUSSIN AC) 100-10 MG/5ML syrup Take 5 mLs by mouth 3 (three) times daily as needed. (Patient not taking: Reported on 03/31/2022)   lidocaine (XYLOCAINE) 2 % solution Use as directed 15 mLs in the mouth or throat every 4 (four) hours as needed for mouth pain. (Patient not taking: Reported on 03/31/2022)   ondansetron (ZOFRAN-ODT) 4 MG disintegrating tablet Take 1 tablet (4 mg total) by mouth every 8 (eight) hours as needed. (Patient not taking: Reported on 03/31/2022)   No facility-administered medications prior to visit.    Review of Systems  Constitutional:  Negative for chills and fever.  HENT:  Positive for congestion and hearing loss. Negative for ear discharge and ear pain.        Reports muffled hearing and ear fullness        Objective    BP  125/70   Pulse 73   Temp 97.8 F (36.6 C) (Oral)   Ht 5' 5"$  (1.651 m)   Wt (!) 323 lb 8 oz (146.7 kg)   SpO2 98%   BMI 53.83 kg/m    Physical Exam Vitals reviewed.  Constitutional:      General: She is awake.     Appearance: Normal appearance. She is well-developed and well-groomed.  HENT:     Head: Normocephalic and atraumatic.     Right Ear: Hearing and ear canal normal. No drainage, swelling or tenderness. No hemotympanum. Tympanic membrane is retracted. Tympanic membrane is not injected, scarred, perforated, erythematous or bulging.     Left Ear: Hearing and ear canal normal. No drainage, swelling or tenderness. A middle ear effusion is present. No hemotympanum. Tympanic membrane is retracted. Tympanic membrane is not injected, scarred, perforated, erythematous or bulging.  Pulmonary:     Effort: Pulmonary effort is normal.  Musculoskeletal:     Cervical back: Normal range of motion.  Neurological:     General: No focal deficit present.     Mental Status: She is alert and oriented to person, place, and time. Mental status is at baseline.  Psychiatric:        Mood and Affect: Mood normal.  Behavior: Behavior normal. Behavior is cooperative.        Thought Content: Thought content normal.        Judgment: Judgment normal.       No results found for any visits on 03/31/22.  Assessment & Plan      No follow-ups on file.      Problem List Items Addressed This Visit   None Visit Diagnoses     Eustachian tube dysfunction, bilateral    -  Primary Acute, new concern Reports feeling her left ear is muffled and full since she was ill last Thurs  She denies pain, fevers, chills at this time and has been using Flonase for the past few days to help with pressure  Recommend she continue with Flonase and can add an antihistamine and Ibuprofen to further assist with symptoms  Reviewed that if symptoms are not improving in the next 4-6 weeks we can refer to ENT for  evaluation and further potential management Follow up as needed     Middle ear effusion, left            No follow-ups on file.   I, Chantavia Bazzle E Latessa Tillis, PA-C, have reviewed all documentation for this visit. The documentation on 03/31/22 for the exam, diagnosis, procedures, and orders are all accurate and complete.   Talitha Givens, MHS, PA-C [facility]

## 2022-05-01 NOTE — Progress Notes (Unsigned)
There were no vitals taken for this visit.   Subjective:    Patient ID: Diana Sexton, female    DOB: 09/14/1985, 37 y.o.   MRN: [id number]  HPI: Diana Sexton is a 37 y.o. female presenting on 05/02/2022 for comprehensive medical examination. Current medical complaints include:{Blank single:19197::"none","***"}  She currently lives with: Menopausal Symptoms: {Blank single:19197::"yes","no"}  Depression Screen done today and results listed below:     03/31/2022    8:23 AM 10/31/2021    9:49 AM 08/29/2021    2:26 PM 06/29/2021   10:42 AM 05/18/2021    8:22 AM  Depression screen PHQ 2/9  Decreased Interest 0 0 0 0 0  Down, Depressed, Hopeless 0 0 0 0 0  PHQ - 2 Score 0 0 0 0 0  Altered sleeping 2 1 0 0 1  Tired, decreased energy 1 0 0 0 0  Change in appetite 1 0 0 0 1  Feeling bad or failure about yourself  1 0 0 0 0  Trouble concentrating 0 0 0 0 0  Moving slowly or fidgety/restless 0 0 0 0 0  Suicidal thoughts 0 0 0 0 0  PHQ-9 Score 5 1 0 0 2  Difficult doing work/chores Not difficult at all Not difficult at all Not difficult at all Not difficult at all Not difficult at all    The patient {has/does not have:19849} a history of falls. I {did/did not:19850} complete a risk assessment for falls. A plan of care for falls {was/was not:19852} documented.   Past Medical History:  Past Medical History:  Diagnosis Date   Anemia    Anxiety    Cellulitis 2012   caused stillbirth   Deep vein thrombosis (DVT) of right lower extremity ([place]) 02/04/2020   a.) s/p hernia surgery 01/06/2020 and D&C 01/27/2020 --> DVT (+) 02/04/2020 --> occlusive thrombus in the RIGHT posterior tibial vein; superficial thrombus in the greater saphenous extending from upper calf to distal RIGHT thigh   Deep vein thrombosis (DVT) of right upper extremity ([place]) 07/10/2011   a.) occurred in setting of pregnancy --> thrombus noted RIGHT subclavian (occlusive), axillary, and brachial (occlusive) veins   Deep  vein thrombosis (DVT) of right upper extremity ([place]) 02/08/2018   a.) occlusive thrombus in the basilic vein and one of the brachial veins   Dysmenorrhea    Family history of breast cancer 11/2013   BRCA/MyRisk neg   Headache    related to hormonal meds   Hernia, abdominal    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   a.) hypercoagulable workup (factor V, ATIII, protein C, protein S, lupus anticoagulant, and anticardiolipin Ab) otherwise NEGATIVE   History of kidney stones    Increased risk of breast cancer 11/2013   IBIS=27%   Obesity    Personal history of noncompliance with medical treatment    a.) 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); 3 seperate DVTs; advised of lifelong anticoagulation therapy, however noncompliant increasing risk of subsequent thrombus/emobolus   Ventral hernia     Surgical History:  Past Surgical History:  Procedure Laterality Date   CHOLECYSTECTOMY  2007   CYSTOSCOPY W/ RETROGRADES Left 02/02/2017   Procedure: CYSTOSCOPY WITH RETROGRADE PYELOGRAM;  Surgeon: Abbie Sons, MD;  Location: [facility];  Service: Urology;  Laterality: Left;   CYSTOSCOPY W/ RETROGRADES Right 06/26/2019   Procedure: CYSTOSCOPY WITH RETROGRADE PYELOGRAM;  Surgeon: Abbie Sons, MD;  Location: [facility];  Service: Urology;  Laterality: Right;   CYSTOSCOPY/URETEROSCOPY/HOLMIUM LASER/STENT PLACEMENT  Left 02/02/2017   Procedure: CYSTOSCOPY/URETEROSCOPY/HOLMIUM LASER/STENT PLACEMENT;  Surgeon: Abbie Sons, MD;  Location: [facility];  Service: Urology;  Laterality: Left;   CYSTOSCOPY/URETEROSCOPY/HOLMIUM LASER/STENT PLACEMENT Right 06/26/2019   Procedure: CYSTOSCOPY/URETEROSCOPY/HOLMIUM LASER/STENT PLACEMENT;  Surgeon: Abbie Sons, MD;  Location: [facility];  Service: Urology;  Laterality: Right;   CYSTOSCOPY/URETEROSCOPY/HOLMIUM LASER/STENT PLACEMENT Left 09/06/2021   Procedure: CYSTOSCOPY/URETEROSCOPY/HOLMIUM LASER/STENT PLACEMENT;  Surgeon: Abbie Sons, MD;   Location: [facility];  Service: Urology;  Laterality: Left;   DILITATION & CURRETTAGE/HYSTROSCOPY WITH ESSURE     DILITATION & CURRETTAGE/HYSTROSCOPY WITH NOVASURE ABLATION N/A 12/24/2018   Procedure: DILATATION & CURETTAGE/HYSTEROSCOPY WITH ENDOMETRIAL ABLATION (MINERVA SYSTEM);  Surgeon: Will Bonnet, MD;  Location: [facility];  Service: Gynecology;  Laterality: N/A;   HYSTEROSCOPY WITH D & C N/A 01/27/2020   Procedure: DILATATION AND CURETTAGE /HYSTEROSCOPY;  Surgeon: Will Bonnet, MD;  Location: [facility];  Service: Gynecology;  Laterality: N/A;   INTRAUTERINE DEVICE (IUD) INSERTION     TONSILLECTOMY AND ADENOIDECTOMY     TUBAL LIGATION  2014   XI ROBOTIC ASSISTED VENTRAL HERNIA N/A 01/06/2020   Procedure: XI ROBOTIC ASSISTED VENTRAL HERNIA;  Surgeon: Olean Ree, MD;  Location: [facility];  Service: General;  Laterality: N/A;    Medications:  Current Outpatient Medications on File Prior to Visit  Medication Sig   guaiFENesin-codeine (CHERATUSSIN AC) 100-10 MG/5ML syrup Take 5 mLs by mouth 3 (three) times daily as needed. (Patient not taking: Reported on 03/31/2022)   lidocaine (XYLOCAINE) 2 % solution Use as directed 15 mLs in the mouth or throat every 4 (four) hours as needed for mouth pain. (Patient not taking: Reported on 03/31/2022)   ondansetron (ZOFRAN-ODT) 4 MG disintegrating tablet Take 1 tablet (4 mg total) by mouth every 8 (eight) hours as needed. (Patient not taking: Reported on 03/31/2022)   [DISCONTINUED] enoxaparin (LOVENOX) 40 MG/0.4ML injection Inject 0.4 mLs (40 mg total) into the skin every 12 (twelve) hours for 3 days.   No current facility-administered medications on file prior to visit.    Allergies:  Allergies  Allergen Reactions   Oxycodone Rash   Penicillins Rash    TOLERATED CEFAZOLIN Has had a PCN reaction causing, facial/tongue/throat swelling, SOB or lightheadedness with hypotension: NO Has had a PCN reaction causing severe rash involving mucus  membranes or skin necrosis: NO Has had a PCN reaction that required hospitalization: No Has had a PCN reaction occurring within the last 10 years: Yes If all of the above answers are "NO", then may proceed with Cephalosporin use. Updated 01/06/20; Reaction was low severity rash. Ancef given. No reaction     Social History:  Social History   Socioeconomic History   Marital status: Married    Spouse name: Programmer, multimedia   Number of children: 2   Years of education: Not on file   Highest education level: Not on file  Occupational History   Not on file  Tobacco Use   Smoking status: Never   Smokeless tobacco: Never  Vaping Use   Vaping Use: Never used  Substance and Sexual Activity   Alcohol use: Yes    Comment: 1 drink per month   Drug use: No   Sexual activity: Yes    Birth control/protection: Other-see comments, Surgical    Comment: tubal ligation  Other Topics Concern   Not on file  Social History Narrative   Lives with husband.   Social Determinants of Health   Financial Resource Strain: Low Risk  (02/09/2018)  Overall Financial Resource Strain (CARDIA)    Difficulty of Paying Living Expenses: Not hard at all  Food Insecurity: No Food Insecurity (02/09/2018)   Hunger Vital Sign    Worried About Running Out of Food in the Last Year: Never true    Ran Out of Food in the Last Year: Never true  Transportation Needs: No Transportation Needs (02/09/2018)   PRAPARE - Hydrologist (Medical): No    Lack of Transportation (Non-Medical): No  Physical Activity: Inactive (02/09/2018)   Exercise Vital Sign    Days of Exercise per Week: 0 days    Minutes of Exercise per Session: 0 min  Stress: No Stress Concern Present (02/09/2018)   [place]    Feeling of Stress : Not at all  Social Connections: Somewhat Isolated (02/09/2018)   Social Connection and Isolation Panel [NHANES]     Frequency of Communication with Friends and Family: Twice a week    Frequency of Social Gatherings with Friends and Family: Twice a week    Attends Religious Services: Never    Marine scientist or Organizations: No    Attends Archivist Meetings: Never    Marital Status: Married  Human resources officer Violence: Not At Risk (02/09/2018)   Humiliation, Afraid, Rape, and Kick questionnaire    Fear of Current or Ex-Partner: No    Emotionally Abused: No    Physically Abused: No    Sexually Abused: No   Social History   Tobacco Use  Smoking Status Never  Smokeless Tobacco Never   Social History   Substance and Sexual Activity  Alcohol Use Yes   Comment: 1 drink per month    Family History:  Family History  Problem Relation Age of Onset   Cancer Mother        breast   Stroke Mother    Diabetes Father    Hypertension Father    Heart disease Maternal Grandfather    Dementia Paternal Grandmother    Bladder Cancer Neg Hx    Kidney cancer Neg Hx     Past medical history, surgical history, medications, allergies, family history and social history reviewed with patient today and changes made to appropriate areas of the chart.   ROS All other ROS negative except what is listed above and in the HPI.      Objective:    There were no vitals taken for this visit.  Wt Readings from Last 3 Encounters:  03/31/22 (!) 323 lb 8 oz (146.7 kg)  03/24/22 (!) 322 lb 12.8 oz (146.4 kg)  01/12/22 (!) 320 lb (145.2 kg)    Physical Exam  Results for orders placed or performed during the hospital encounter of 03/24/22  Rapid Influenza A&B Antigens   Specimen: Respiratory  Result Value Ref Range   Influenza A ([facility]) NEGATIVE NEGATIVE   Influenza B ([facility]) POSITIVE (A) NEGATIVE  SARS Coronavirus 2 by RT PCR (hospital order, performed in [place] hospital lab) *cepheid single result test* Anterior Nasal Swab   Specimen: Anterior Nasal Swab  Result Value Ref Range   SARS  Coronavirus 2 by RT PCR NEGATIVE NEGATIVE      Assessment & Plan:   Problem List Items Addressed This Visit       Endocrine   IFG (impaired fasting glucose)     Other   Morbid obesity ([place])   Other Visit Diagnoses     Annual physical exam    -  Primary        Follow up plan: No follow-ups on file.   LABORATORY TESTING:  - Pap smear: {Blank [id number] done","not applicable","up to date","done elsewhere"}  IMMUNIZATIONS:   - Tdap: Tetanus vaccination status reviewed: {tetanus status:315746}. - Influenza: {Blank single:19197::"Up to date","Administered today","Postponed to flu season","Refused","Given elsewhere"} - Pneumovax: {Blank single:19197::"Up to date","Administered today","Not applicable","Refused","Given elsewhere"} - Prevnar: {Blank single:19197::"Up to date","Administered today","Not applicable","Refused","Given elsewhere"} - COVID: {Blank single:19197::"Up to date","Administered today","Not applicable","Refused","Given elsewhere"} - HPV: {Blank single:19197::"Up to date","Administered today","Not applicable","Refused","Given elsewhere"} - Shingrix vaccine: {Blank single:19197::"Up to date","Administered today","Not applicable","Refused","Given elsewhere"}  SCREENING: -Mammogram: {Blank single:19197::"Up to date","Ordered today","Not applicable","Refused","Done elsewhere"}  - Colonoscopy: {Blank single:19197::"Up to date","Ordered today","Not applicable","Refused","Done elsewhere"}  - Bone Density: {Blank single:19197::"Up to date","Ordered today","Not applicable","Refused","Done elsewhere"}  -Hearing Test: {Blank single:19197::"Up to date","Ordered today","Not applicable","Refused","Done elsewhere"}  -Spirometry: {Blank single:19197::"Up to date","Ordered today","Not applicable","Refused","Done elsewhere"}   PATIENT COUNSELING:   Advised to take 1 mg of folate supplement per day if capable of pregnancy.   Sexuality: Discussed sexually transmitted  diseases, partner selection, use of condoms, avoidance of unintended pregnancy  and contraceptive alternatives.   Advised to avoid cigarette smoking.  I discussed with the patient that most people either abstain from alcohol or drink within safe limits (<=14/week and <=4 drinks/occasion for males, <=7/weeks and <= 3 drinks/occasion for females) and that the risk for alcohol disorders and other health effects rises proportionally with the number of drinks per week and how often a drinker exceeds daily limits.  Discussed cessation/primary prevention of drug use and availability of treatment for abuse.   Diet: Encouraged to adjust caloric intake to maintain  or achieve ideal body weight, to reduce intake of dietary saturated fat and total fat, to limit sodium intake by avoiding high sodium foods and not adding table salt, and to maintain adequate dietary potassium and calcium preferably from fresh fruits, vegetables, and low-fat dairy products.    stressed the importance of regular exercise  Injury prevention: Discussed safety belts, safety helmets, smoke detector, smoking near bedding or upholstery.   Dental health: Discussed importance of regular tooth brushing, flossing, and dental visits.    NEXT PREVENTATIVE PHYSICAL DUE IN 1 YEAR. No follow-ups on file.

## 2022-05-02 ENCOUNTER — Encounter: Payer: Self-pay | Admitting: Nurse Practitioner

## 2022-05-02 ENCOUNTER — Other Ambulatory Visit (HOSPITAL_COMMUNITY)
Admission: RE | Admit: 2022-05-02 | Discharge: 2022-05-02 | Disposition: A | Discharge status: Home / Self Care | Payer: BC Managed Care – PPO | Source: Ambulatory Visit | Attending: Nurse Practitioner | Admitting: Nurse Practitioner

## 2022-05-02 ENCOUNTER — Ambulatory Visit (INDEPENDENT_AMBULATORY_CARE_PROVIDER_SITE_OTHER): Payer: BC Managed Care – PPO | Admitting: Nurse Practitioner

## 2022-05-02 VITALS — BP 114/71 | HR 81 | Temp 99.7°F | Ht 65.6 in | Wt 329.1 lb

## 2022-05-02 DIAGNOSIS — Z0000 Encounter for general adult medical examination without abnormal findings: Secondary | ICD-10-CM | POA: Diagnosis not present

## 2022-05-02 DIAGNOSIS — Z136 Encounter for screening for cardiovascular disorders: Secondary | ICD-10-CM | POA: Diagnosis not present

## 2022-05-02 DIAGNOSIS — F419 Anxiety disorder, unspecified: Secondary | ICD-10-CM | POA: Diagnosis not present

## 2022-05-02 DIAGNOSIS — Z114 Encounter for screening for human immunodeficiency virus [HIV]: Secondary | ICD-10-CM | POA: Diagnosis not present

## 2022-05-02 DIAGNOSIS — Z23 Encounter for immunization: Secondary | ICD-10-CM

## 2022-05-02 DIAGNOSIS — F32A Depression, unspecified: Secondary | ICD-10-CM | POA: Diagnosis not present

## 2022-05-02 DIAGNOSIS — Z1159 Encounter for screening for other viral diseases: Secondary | ICD-10-CM

## 2022-05-02 DIAGNOSIS — R7301 Impaired fasting glucose: Secondary | ICD-10-CM | POA: Diagnosis not present

## 2022-05-02 LAB — MICROSCOPIC EXAMINATION: Bacteria, UA: NONE SEEN

## 2022-05-02 LAB — URINALYSIS, ROUTINE W REFLEX MICROSCOPIC
Bilirubin, UA: NEGATIVE
Glucose, UA: NEGATIVE
Ketones, UA: NEGATIVE
Leukocytes,UA: NEGATIVE
Nitrite, UA: NEGATIVE
Specific Gravity, UA: >1.03 — ABNORMAL HIGH (ref 1.005–1.030)
Urobilinogen, Ur: 0.2 mg/dL (ref 0.2–1.0)
pH, UA: 5.5 (ref 5.0–7.5)

## 2022-05-02 MED ORDER — BUPROPION HCL ER (XL) 150 MG PO TB24: 150.0000 mg | ORAL_TABLET | Freq: Every day | ORAL | 0 refills | Status: DC

## 2022-05-02 NOTE — Assessment & Plan Note (Signed)
Chronic. Not well controlled.  Anxiety worse than prior.  Will start Wellbutrin 150mg  XL. Side effects and benefits of medication discussed during visit.  Labs ordered today.  Follow up in 6 weeks. Call sooner if concerns arise.

## 2022-05-02 NOTE — Assessment & Plan Note (Signed)
Recommended eating smaller high protein, low fat meals more frequently and exercising 30 mins a day 5 times a week with a goal of 10-15lb weight loss in the next 3 months.  

## 2022-05-02 NOTE — Assessment & Plan Note (Signed)
Labs ordered at visit today.  Will make recommendations based on lab results.   

## 2022-05-03 LAB — COMPREHENSIVE METABOLIC PANEL
ALT: 22 IU/L (ref 0–32)
AST: 19 IU/L (ref 0–40)
Albumin/Globulin Ratio: 1.4 (ref 1.2–2.2)
Albumin: 4.1 g/dL (ref 3.9–4.9)
Alkaline Phosphatase: 85 IU/L (ref 44–121)
BUN/Creatinine Ratio: 16 (ref 9–23)
BUN: 9 mg/dL (ref 6–20)
Bilirubin Total: 0.4 mg/dL (ref 0.0–1.2)
CO2: 22 mmol/L (ref 20–29)
Calcium: 9.3 mg/dL (ref 8.7–10.2)
Chloride: 101 mmol/L (ref 96–106)
Creatinine, Ser: 0.56 mg/dL — ABNORMAL LOW (ref 0.57–1.00)
Globulin, Total: 2.9 g/dL (ref 1.5–4.5)
Glucose: 95 mg/dL (ref 70–99)
Potassium: 4 mmol/L (ref 3.5–5.2)
Sodium: 140 mmol/L (ref 134–144)
Total Protein: 7 g/dL (ref 6.0–8.5)
eGFR: 122 mL/min/{1.73_m2} (ref >59)

## 2022-05-03 LAB — CBC WITH DIFFERENTIAL/PLATELET
Basophils Absolute: 0.1 10*3/uL (ref 0.0–0.2)
Basos: 1 %
EOS (ABSOLUTE): 0.2 10*3/uL (ref 0.0–0.4)
Eos: 2 %
Hematocrit: 40.9 % (ref 34.0–46.6)
Hemoglobin: 13.4 g/dL (ref 11.1–15.9)
Immature Grans (Abs): 0 10*3/uL (ref 0.0–0.1)
Immature Granulocytes: 0 %
Lymphocytes Absolute: 1.9 10*3/uL (ref 0.7–3.1)
Lymphs: 23 %
MCH: 26 pg — ABNORMAL LOW (ref 26.6–33.0)
MCHC: 32.8 g/dL (ref 31.5–35.7)
MCV: 79 fL (ref 79–97)
Monocytes Absolute: 0.6 10*3/uL (ref 0.1–0.9)
Monocytes: 7 %
Neutrophils Absolute: 5.7 10*3/uL (ref 1.4–7.0)
Neutrophils: 67 %
Platelets: 428 10*3/uL (ref 150–450)
RBC: 5.15 x10E6/uL (ref 3.77–5.28)
RDW: 15.6 % — ABNORMAL HIGH (ref 11.7–15.4)
WBC: 8.5 10*3/uL (ref 3.4–10.8)

## 2022-05-03 LAB — LIPID PANEL
Chol/HDL Ratio: 5.2 ratio — ABNORMAL HIGH (ref 0.0–4.4)
Cholesterol, Total: 220 mg/dL — ABNORMAL HIGH (ref 100–199)
HDL: 42 mg/dL (ref >39)
LDL Chol Calc (NIH): 154 mg/dL — ABNORMAL HIGH (ref 0–99)
Triglycerides: 132 mg/dL (ref 0–149)
VLDL Cholesterol Cal: 24 mg/dL (ref 5–40)

## 2022-05-03 LAB — HEMOGLOBIN A1C
Est. average glucose Bld gHb Est-mCnc: 123 mg/dL
Hgb A1c MFr Bld: 5.9 % — ABNORMAL HIGH (ref 4.8–5.6)

## 2022-05-03 LAB — HEPATITIS C ANTIBODY: Hep C Virus Ab: NONREACTIVE

## 2022-05-03 LAB — TSH: TSH: 1.47 u[IU]/mL (ref 0.450–4.500)

## 2022-05-03 LAB — HIV ANTIBODY (ROUTINE TESTING W REFLEX): HIV Screen 4th Generation wRfx: NONREACTIVE

## 2022-05-03 NOTE — Progress Notes (Signed)
Hi Diana Sexton. It was nice to see you yesterday.  Your lab work looks good.  Your cholesterol is elevated and your A1c is in the prediabetic range.  I recommend following a low fat and low carb diet.  No other concerns at this time. Continue with your current medication regimen.  Follow up as discussed.  Please let me know if you have any questions.

## 2022-05-08 LAB — CYTOLOGY - PAP
Amendment: REACTIVE
Diagnosis: REACTIVE

## 2022-05-08 NOTE — Progress Notes (Signed)
Hi [place]. Your PAP was normal.

## 2022-06-14 ENCOUNTER — Ambulatory Visit (INDEPENDENT_AMBULATORY_CARE_PROVIDER_SITE_OTHER): Payer: BC Managed Care – PPO | Admitting: Nurse Practitioner

## 2022-06-14 ENCOUNTER — Encounter: Payer: Self-pay | Admitting: Nurse Practitioner

## 2022-06-14 VITALS — BP 122/69 | HR 80 | Temp 99.0°F | Wt 328.4 lb

## 2022-06-14 DIAGNOSIS — F32A Depression, unspecified: Secondary | ICD-10-CM

## 2022-06-14 DIAGNOSIS — F419 Anxiety disorder, unspecified: Secondary | ICD-10-CM

## 2022-06-14 MED ORDER — BUPROPION HCL ER (XL) 300 MG PO TB24: 300.0000 mg | ORAL_TABLET | Freq: Every day | ORAL | 0 refills | Status: DC

## 2022-06-14 NOTE — Assessment & Plan Note (Signed)
Chronic.  Improved from prior.  Will increase Wellbutrin from 150mg  daily to 300mg  daily.  Follow up in 1 month.  Call sooner if concerns arise.

## 2022-06-14 NOTE — Progress Notes (Signed)
BP 122/69   Pulse 80   Temp 99 F (37.2 C) (Oral)   Wt (!) 328 lb 6.4 oz (149 kg)   LMP  (LMP Unknown)   SpO2 98%   BMI 53.65 kg/m    Subjective:    Patient ID: Diana Sexton, female    DOB: Jun 15, 1985, 37 y.o.   MRN: [id number]  HPI: Diana PALLO is a 37 y.o. female  Chief Complaint  Patient presents with   Anxiety    Pt states she feels like the bupropion has been helping with her anxiety. States she can tell if she misses a dose.    Depression   ANXIETY Patient states she feels like when she is taking the Wellbutrin is working.  States when she isn't taking the medication she feels more anxious.  She forgets at times when her routine changes.  She is sleeping better with the medication.  She feels like the 150mg  dose could be a little better.  Denies SI.   Flowsheet Row Office Visit from 06/14/2022 in [facility]  PHQ-9 Total Score 7         06/14/2022    9:14 AM 05/02/2022    8:46 AM 03/31/2022    8:24 AM 10/31/2021    9:50 AM  GAD 7 : Generalized Anxiety Score  Nervous, Anxious, on Edge 1 1 0 0  Control/stop worrying 0 0 0 0  Worry too much - different things 1 0 1 0  Trouble relaxing 0 0 0 0  Restless 0 0 0 0  Easily annoyed or irritable 0 0 0 0  Afraid - awful might happen 0 0 0 0  Total GAD 7 Score 2 1 1  0  Anxiety Difficulty Not difficult at all Not difficult at all Not difficult at all Not difficult at all     Relevant past medical, surgical, family and social history reviewed and updated as indicated. Interim medical history since our last visit reviewed. Allergies and medications reviewed and updated.  Review of Systems  Psychiatric/Behavioral:  The patient is nervous/anxious.     Per HPI unless specifically indicated above     Objective:    BP 122/69   Pulse 80   Temp 99 F (37.2 C) (Oral)   Wt (!) 328 lb 6.4 oz (149 kg)   LMP  (LMP Unknown)   SpO2 98%   BMI 53.65 kg/m   Wt Readings from Last 3 Encounters:   06/14/22 (!) 328 lb 6.4 oz (149 kg)  05/02/22 (!) 329 lb 1.6 oz (149.3 kg)  03/31/22 (!) 323 lb 8 oz (146.7 kg)    Physical Exam Vitals and nursing note reviewed.  Constitutional:      General: She is not in acute distress.    Appearance: Normal appearance. She is obese. She is not ill-appearing, toxic-appearing or diaphoretic.  HENT:     Head: Normocephalic.     Right Ear: External ear normal.     Left Ear: External ear normal.     Nose: Nose normal.     Mouth/Throat:     Mouth: Mucous membranes are moist.     Pharynx: Oropharynx is clear.  Eyes:     General:        Right eye: No discharge.        Left eye: No discharge.     Extraocular Movements: Extraocular movements intact.     Conjunctiva/sclera: Conjunctivae normal.     Pupils: Pupils are  equal, round, and reactive to light.  Cardiovascular:     Rate and Rhythm: Normal rate and regular rhythm.     Heart sounds: No murmur heard. Pulmonary:     Effort: Pulmonary effort is normal. No respiratory distress.     Breath sounds: Normal breath sounds. No wheezing or rales.  Musculoskeletal:     Cervical back: Normal range of motion and neck supple.  Skin:    General: Skin is warm and dry.     Capillary Refill: Capillary refill takes less than 2 seconds.  Neurological:     General: No focal deficit present.     Mental Status: She is alert and oriented to person, place, and time. Mental status is at baseline.  Psychiatric:        Mood and Affect: Mood normal.        Behavior: Behavior normal.        Thought Content: Thought content normal.        Judgment: Judgment normal.     Results for orders placed or performed in visit on 05/02/22  Microscopic Examination   Urine  Result Value Ref Range   WBC, UA 0-5 0 - 5 /hpf   RBC, Urine 0-2 0 - 2 /hpf   Epithelial Cells (non renal) 0-10 0 - 10 /hpf   Bacteria, UA None seen None seen/Few  CBC with Differential/Platelet  Result Value Ref Range   WBC 8.5 3.4 - 10.8 x10E3/uL    RBC 5.15 3.77 - 5.28 x10E6/uL   Hemoglobin 13.4 11.1 - 15.9 g/dL   Hematocrit 09.8 11.9 - 46.6 %   MCV 79 79 - 97 fL   MCH 26.0 (L) 26.6 - 33.0 pg   MCHC 32.8 31.5 - 35.7 g/dL   RDW 14.7 (H) 82.9 - 56.2 %   Platelets 428 150 - 450 x10E3/uL   Neutrophils 67 Not Estab. %   Lymphs 23 Not Estab. %   Monocytes 7 Not Estab. %   Eos 2 Not Estab. %   Basos 1 Not Estab. %   Neutrophils Absolute 5.7 1.4 - 7.0 x10E3/uL   Lymphocytes Absolute 1.9 0.7 - 3.1 x10E3/uL   Monocytes Absolute 0.6 0.1 - 0.9 x10E3/uL   EOS (ABSOLUTE) 0.2 0.0 - 0.4 x10E3/uL   Basophils Absolute 0.1 0.0 - 0.2 x10E3/uL   Immature Granulocytes 0 Not Estab. %   Immature Grans (Abs) 0.0 0.0 - 0.1 x10E3/uL  Comprehensive metabolic panel  Result Value Ref Range   Glucose 95 70 - 99 mg/dL   BUN 9 6 - 20 mg/dL   Creatinine, Ser 1.30 (L) 0.57 - 1.00 mg/dL   eGFR 865 >78 IO/NGE/9.52   BUN/Creatinine Ratio 16 9 - 23   Sodium 140 134 - 144 mmol/L   Potassium 4.0 3.5 - 5.2 mmol/L   Chloride 101 96 - 106 mmol/L   CO2 22 20 - 29 mmol/L   Calcium 9.3 8.7 - 10.2 mg/dL   Total Protein 7.0 6.0 - 8.5 g/dL   Albumin 4.1 3.9 - 4.9 g/dL   Globulin, Total 2.9 1.5 - 4.5 g/dL   Albumin/Globulin Ratio 1.4 1.2 - 2.2   Bilirubin Total 0.4 0.0 - 1.2 mg/dL   Alkaline Phosphatase 85 44 - 121 IU/L   AST 19 0 - 40 IU/L   ALT 22 0 - 32 IU/L  Lipid panel  Result Value Ref Range   Cholesterol, Total 220 (H) 100 - 199 mg/dL   Triglycerides 841 0 - 149 mg/dL  HDL 42 >39 mg/dL   VLDL Cholesterol Cal 24 5 - 40 mg/dL   LDL Chol Calc (NIH) 562 (H) 0 - 99 mg/dL   Chol/HDL Ratio 5.2 (H) 0.0 - 4.4 ratio  TSH  Result Value Ref Range   TSH 1.470 0.450 - 4.500 uIU/mL  Urinalysis, Routine w reflex microscopic  Result Value Ref Range   Specific Gravity, UA >1.030 (H) 1.005 - 1.030   pH, UA 5.5 5.0 - 7.5   Color, UA Yellow Yellow   Appearance Ur Clear Clear   Leukocytes,UA Negative Negative   Protein,UA 1+ (A) Negative/Trace   Glucose, UA  Negative Negative   Ketones, UA Negative Negative   RBC, UA 1+ (A) Negative   Bilirubin, UA Negative Negative   Urobilinogen, Ur 0.2 0.2 - 1.0 mg/dL   Nitrite, UA Negative Negative   Microscopic Examination See below:   HgB A1c  Result Value Ref Range   Hgb A1c MFr Bld 5.9 (H) 4.8 - 5.6 %   Est. average glucose Bld gHb Est-mCnc 123 mg/dL  HIV Antibody (routine testing w rflx)  Result Value Ref Range   HIV Screen 4th Generation wRfx Non Reactive Non Reactive  Hepatitis C Antibody  Result Value Ref Range   Hep C Virus Ab Non Reactive Non Reactive  Cytology - PAP  Result Value Ref Range   Amendment DIAGNOSTIC AMENDMENT:    Amendment      ATTENTION: REVISED DIAGNOSIS. THIS MAY AFFECT DIAGNOSTIC AND/OR   Amendment      THERAPUTIC DECISIONS. The final report should read BENIGN   Amendment      REACTIVE/REPARATIVE CHANGES INSTEAD OF ATYPICAL CELLS OF UNDETERMINDED   Amendment      SIGNIFICANCE (ASCUS). This has been corrected. This revised report was   Amendment      called to [place] by Jessy Oto on 05/08/2022. (initials:   Amendment sa/date: 05/08/2022)    Adequacy      Satisfactory for evaluation; transformation zone component PRESENT.   Diagnosis - Benign reactive/reparative changes       Assessment & Plan:   Problem List Items Addressed This Visit       Other   Anxiety and depression - Primary    Chronic.  Improved from prior.  Will increase Wellbutrin from 150mg  daily to 300mg  daily.  Follow up in 1 month.  Call sooner if concerns arise.       Relevant Medications   buPROPion (WELLBUTRIN XL) 300 MG 24 hr tablet     Follow up plan: Return in about 1 month (around 07/15/2022) for Depression/Anxiety FU.

## 2022-06-15 ENCOUNTER — Other Ambulatory Visit: Payer: Self-pay | Admitting: Family Medicine

## 2022-06-15 DIAGNOSIS — Z87442 Personal history of urinary calculi: Secondary | ICD-10-CM

## 2022-07-25 ENCOUNTER — Ambulatory Visit: Payer: BC Managed Care – PPO | Admitting: Nurse Practitioner

## 2022-07-25 ENCOUNTER — Ambulatory Visit
Admission: RE | Admit: 2022-07-25 | Discharge: 2022-07-25 | Disposition: A | Discharge status: Home / Self Care | Payer: BC Managed Care – PPO | Source: Ambulatory Visit | Attending: Nurse Practitioner | Admitting: Nurse Practitioner

## 2022-07-25 ENCOUNTER — Other Ambulatory Visit: Payer: Self-pay | Admitting: Nurse Practitioner

## 2022-07-25 ENCOUNTER — Ambulatory Visit: Admission: RE | Admit: 2022-07-25 | Payer: BC Managed Care – PPO | Source: Ambulatory Visit

## 2022-07-25 ENCOUNTER — Encounter: Payer: Self-pay | Admitting: Nurse Practitioner

## 2022-07-25 VITALS — BP 147/75 | HR 82 | Temp 98.1°F | Wt 335.2 lb

## 2022-07-25 DIAGNOSIS — F32A Depression, unspecified: Secondary | ICD-10-CM

## 2022-07-25 DIAGNOSIS — E049 Nontoxic goiter, unspecified: Secondary | ICD-10-CM

## 2022-07-25 DIAGNOSIS — R0602 Shortness of breath: Secondary | ICD-10-CM

## 2022-07-25 DIAGNOSIS — I82441 Acute embolism and thrombosis of right tibial vein: Secondary | ICD-10-CM

## 2022-07-25 DIAGNOSIS — F419 Anxiety disorder, unspecified: Secondary | ICD-10-CM

## 2022-07-25 DIAGNOSIS — E041 Nontoxic single thyroid nodule: Secondary | ICD-10-CM

## 2022-07-25 MED ORDER — BUPROPION HCL ER (XL) 300 MG PO TB24: 300.0000 mg | ORAL_TABLET | Freq: Every day | ORAL | 1 refills | Status: DC

## 2022-07-25 MED ORDER — IOHEXOL 350 MG/ML SOLN
75.0000 mL | Freq: Once | INTRAVENOUS | Status: AC | PRN
  Administered 2022-07-25: 75 mL via INTRAVENOUS

## 2022-07-25 NOTE — Progress Notes (Signed)
BP (!) 147/75   Pulse 82   Temp 98.1 F (36.7 C) (Oral)   Wt (!) 335 lb 3.2 oz (152 kg)   SpO2 99%   BMI 54.76 kg/m    Subjective:    Patient ID: Diana Sexton, female    DOB: 10-Jun-1985, 37 y.o.   MRN: [id number]  HPI: Diana Sexton is a 37 y.o. female  Chief Complaint  Patient presents with   Depression   Anxiety   Breathing Problem    Pt states she has been having problems breathing for the last couple weeks and has gotten worse    ANXIETY Patient states she feels like when she is taking the Wellbutrin is working.  Feels like the Wellbutrin 300mg  is a good dose for her.  Denies concerns regarding medication at visit today.    Flowsheet Row Office Visit from 07/25/2022 in [facility]  PHQ-9 Total Score 4         07/25/2022    2:24 PM 06/14/2022    9:14 AM 05/02/2022    8:46 AM 03/31/2022    8:24 AM  GAD 7 : Generalized Anxiety Score  Nervous, Anxious, on Edge 1 1 1  0  Control/stop worrying 0 0 0 0  Worry too much - different things 0 1 0 1  Trouble relaxing 1 0 0 0  Restless 0 0 0 0  Easily annoyed or irritable 0 0 0 0  Afraid - awful might happen 0 0 0 0  Total GAD 7 Score 2 2 1 1   Anxiety Difficulty  Not difficult at all Not difficult at all Not difficult at all    SHORTNESS OF BREATH Patient states over the last couple of weeks she has been having SOB.  She is having difficulty raising her right arm above her head.  This has been going on since her blood clot 11 years ago but has recently worsened.  She has trouble straightening her hair and after lifting something it took her a couple days to recover and feel like she could catch her breath. Duration: weeks Onset: sudden Description of breathing discomfort: SOB Severity: moderate Episode duration: several hours to days Frequency:has increased to daily Related to exertion: yes Cough: no Chest tightness: no Wheezing: no Fevers: no Chest pain: no Palpitations: yes  Nausea:  no Diaphoresis: no Deconditioning: no Status: stable    Relevant past medical, surgical, family and social history reviewed and updated as indicated. Interim medical history since our last visit reviewed. Allergies and medications reviewed and updated.  Review of Systems  Constitutional:  Negative for diaphoresis.  Respiratory:  Positive for shortness of breath. Negative for cough and wheezing.   Cardiovascular:  Positive for palpitations. Negative for chest pain.  Gastrointestinal:  Negative for nausea.  Psychiatric/Behavioral:  The patient is nervous/anxious.     Per HPI unless specifically indicated above     Objective:    BP (!) 147/75   Pulse 82   Temp 98.1 F (36.7 C) (Oral)   Wt (!) 335 lb 3.2 oz (152 kg)   SpO2 99%   BMI 54.76 kg/m   Wt Readings from Last 3 Encounters:  07/25/22 (!) 335 lb 3.2 oz (152 kg)  06/14/22 (!) 328 lb 6.4 oz (149 kg)  05/02/22 (!) 329 lb 1.6 oz (149.3 kg)    Physical Exam Vitals and nursing note reviewed.  Constitutional:      General: She is not in acute distress.  Appearance: Normal appearance. She is obese. She is not ill-appearing, toxic-appearing or diaphoretic.  HENT:     Head: Normocephalic.     Right Ear: External ear normal.     Left Ear: External ear normal.     Nose: Nose normal.     Mouth/Throat:     Mouth: Mucous membranes are moist.     Pharynx: Oropharynx is clear.  Eyes:     General:        Right eye: No discharge.        Left eye: No discharge.     Extraocular Movements: Extraocular movements intact.     Conjunctiva/sclera: Conjunctivae normal.     Pupils: Pupils are equal, round, and reactive to light.  Cardiovascular:     Rate and Rhythm: Normal rate and regular rhythm.     Heart sounds: No murmur heard. Pulmonary:     Effort: Pulmonary effort is normal. No respiratory distress.     Breath sounds: Normal breath sounds. No wheezing or rales.  Musculoskeletal:     Cervical back: Normal range of motion  and neck supple.  Skin:    General: Skin is warm and dry.     Capillary Refill: Capillary refill takes less than 2 seconds.  Neurological:     General: No focal deficit present.     Mental Status: She is alert and oriented to person, place, and time. Mental status is at baseline.  Psychiatric:        Mood and Affect: Mood normal.        Behavior: Behavior normal.        Thought Content: Thought content normal.        Judgment: Judgment normal.     Results for orders placed or performed in visit on 05/02/22  Microscopic Examination   Urine  Result Value Ref Range   WBC, UA 0-5 0 - 5 /hpf   RBC, Urine 0-2 0 - 2 /hpf   Epithelial Cells (non renal) 0-10 0 - 10 /hpf   Bacteria, UA None seen None seen/Few  CBC with Differential/Platelet  Result Value Ref Range   WBC 8.5 3.4 - 10.8 x10E3/uL   RBC 5.15 3.77 - 5.28 x10E6/uL   Hemoglobin 13.4 11.1 - 15.9 g/dL   Hematocrit 16.1 09.6 - 46.6 %   MCV 79 79 - 97 fL   MCH 26.0 (L) 26.6 - 33.0 pg   MCHC 32.8 31.5 - 35.7 g/dL   RDW 04.5 (H) 40.9 - 81.1 %   Platelets 428 150 - 450 x10E3/uL   Neutrophils 67 Not Estab. %   Lymphs 23 Not Estab. %   Monocytes 7 Not Estab. %   Eos 2 Not Estab. %   Basos 1 Not Estab. %   Neutrophils Absolute 5.7 1.4 - 7.0 x10E3/uL   Lymphocytes Absolute 1.9 0.7 - 3.1 x10E3/uL   Monocytes Absolute 0.6 0.1 - 0.9 x10E3/uL   EOS (ABSOLUTE) 0.2 0.0 - 0.4 x10E3/uL   Basophils Absolute 0.1 0.0 - 0.2 x10E3/uL   Immature Granulocytes 0 Not Estab. %   Immature Grans (Abs) 0.0 0.0 - 0.1 x10E3/uL  Comprehensive metabolic panel  Result Value Ref Range   Glucose 95 70 - 99 mg/dL   BUN 9 6 - 20 mg/dL   Creatinine, Ser 9.14 (L) 0.57 - 1.00 mg/dL   eGFR 782 >95 AO/ZHY/8.65   BUN/Creatinine Ratio 16 9 - 23   Sodium 140 134 - 144 mmol/L   Potassium 4.0 3.5 - 5.2  mmol/L   Chloride 101 96 - 106 mmol/L   CO2 22 20 - 29 mmol/L   Calcium 9.3 8.7 - 10.2 mg/dL   Total Protein 7.0 6.0 - 8.5 g/dL   Albumin 4.1 3.9 - 4.9 g/dL    Globulin, Total 2.9 1.5 - 4.5 g/dL   Albumin/Globulin Ratio 1.4 1.2 - 2.2   Bilirubin Total 0.4 0.0 - 1.2 mg/dL   Alkaline Phosphatase 85 44 - 121 IU/L   AST 19 0 - 40 IU/L   ALT 22 0 - 32 IU/L  Lipid panel  Result Value Ref Range   Cholesterol, Total 220 (H) 100 - 199 mg/dL   Triglycerides 161 0 - 149 mg/dL   HDL 42 >09 mg/dL   VLDL Cholesterol Cal 24 5 - 40 mg/dL   LDL Chol Calc (NIH) 604 (H) 0 - 99 mg/dL   Chol/HDL Ratio 5.2 (H) 0.0 - 4.4 ratio  TSH  Result Value Ref Range   TSH 1.470 0.450 - 4.500 uIU/mL  Urinalysis, Routine w reflex microscopic  Result Value Ref Range   Specific Gravity, UA >1.030 (H) 1.005 - 1.030   pH, UA 5.5 5.0 - 7.5   Color, UA Yellow Yellow   Appearance Ur Clear Clear   Leukocytes,UA Negative Negative   Protein,UA 1+ (A) Negative/Trace   Glucose, UA Negative Negative   Ketones, UA Negative Negative   RBC, UA 1+ (A) Negative   Bilirubin, UA Negative Negative   Urobilinogen, Ur 0.2 0.2 - 1.0 mg/dL   Nitrite, UA Negative Negative   Microscopic Examination See below:   HgB A1c  Result Value Ref Range   Hgb A1c MFr Bld 5.9 (H) 4.8 - 5.6 %   Est. average glucose Bld gHb Est-mCnc 123 mg/dL  HIV Antibody (routine testing w rflx)  Result Value Ref Range   HIV Screen 4th Generation wRfx Non Reactive Non Reactive  Hepatitis C Antibody  Result Value Ref Range   Hep C Virus Ab Non Reactive Non Reactive  Cytology - PAP  Result Value Ref Range   Amendment DIAGNOSTIC AMENDMENT:    Amendment      ATTENTION: REVISED DIAGNOSIS. THIS MAY AFFECT DIAGNOSTIC AND/OR   Amendment      THERAPUTIC DECISIONS. The final report should read BENIGN   Amendment      REACTIVE/REPARATIVE CHANGES INSTEAD OF ATYPICAL CELLS OF UNDETERMINDED   Amendment      SIGNIFICANCE (ASCUS). This has been corrected. This revised report was   Amendment      called to [place] by Jessy Oto on 05/08/2022. (initials:   Amendment sa/date: 05/08/2022)    Adequacy       Satisfactory for evaluation; transformation zone component PRESENT.   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  Symptoms are similar to when she had a blood clot.  Not currently on anticogulant.  Will order CTA STAT to rule out blood clot.  Will start patient on anticoagulation if needed based on results.        Relevant Orders   CT ANGIO CHEST AORTA W/CM & OR WO/CM     Other   Anxiety and depression    Chronic.  Controlled.  Continue with current medication regimen of Wellbutrin 300mg  daily.  Refills  sent today.  Return to clinic in 6 months for reevaluation.  Call sooner if concerns arise.        Relevant Medications   buPROPion (WELLBUTRIN XL) 300 MG 24 hr tablet   Other Visit Diagnoses     Shortness of breath       Relevant Orders   CT ANGIO CHEST AORTA W/CM & OR WO/CM        Follow up plan: No follow-ups on file.

## 2022-07-25 NOTE — Assessment & Plan Note (Signed)
Last blood was 11 years ago.  Symptoms are similar to when she had a blood clot.  Not currently on anticogulant.  Will order CTA STAT to rule out blood clot.  Will start patient on anticoagulation if needed based on results.

## 2022-07-25 NOTE — Progress Notes (Signed)
Hi Diana Sexton.  Your CT did not show any evidence of a blood clot which is great.  It does show that your thyroid is larger on the right than the left.  I will order an ultrasound of your thyroid to look at it further.  This could be the cause of your symptoms.  Someone will reach out to you to get this scheduled.

## 2022-07-25 NOTE — Addendum Note (Signed)
Addended by: Larae Grooms on: 07/25/2022 04:17 PM   Modules accepted: Orders

## 2022-07-25 NOTE — Assessment & Plan Note (Signed)
Chronic.  Controlled.  Continue with current medication regimen of Wellbutrin 300mg daily.  Refills sent today.   Return to clinic in 6 months for reevaluation.  Call sooner if concerns arise.   

## 2022-07-26 ENCOUNTER — Encounter: Payer: Self-pay | Admitting: Nurse Practitioner

## 2022-07-26 NOTE — Progress Notes (Signed)
Called and spoke with patient on the phone last night to give results.

## 2022-08-03 ENCOUNTER — Ambulatory Visit
Admission: RE | Admit: 2022-08-03 | Discharge: 2022-08-03 | Disposition: A | Discharge status: Home / Self Care | Payer: BC Managed Care – PPO | Source: Ambulatory Visit | Attending: Nurse Practitioner | Admitting: Nurse Practitioner

## 2022-08-03 DIAGNOSIS — E049 Nontoxic goiter, unspecified: Secondary | ICD-10-CM | POA: Insufficient documentation

## 2022-08-03 DIAGNOSIS — E041 Nontoxic single thyroid nodule: Secondary | ICD-10-CM | POA: Diagnosis not present

## 2022-08-04 NOTE — Progress Notes (Signed)
Hi Diana Sexton.  Your thyroid ultrasound showed that you do have a couple of nodules.  It is recommended that you have two of the biopsied.  I have placed the order for ENT- that is who will do the biopsy.  They will call you to get this scheduled.

## 2022-08-04 NOTE — Addendum Note (Signed)
Addended by: Larae Grooms on: 08/04/2022 08:05 AM   Modules accepted: Orders

## 2022-08-14 ENCOUNTER — Ambulatory Visit: Payer: BC Managed Care – PPO | Admitting: Nurse Practitioner

## 2022-09-06 DIAGNOSIS — G4733 Obstructive sleep apnea (adult) (pediatric): Secondary | ICD-10-CM | POA: Diagnosis not present

## 2022-09-06 DIAGNOSIS — E049 Nontoxic goiter, unspecified: Secondary | ICD-10-CM | POA: Diagnosis not present

## 2022-09-13 DIAGNOSIS — E042 Nontoxic multinodular goiter: Secondary | ICD-10-CM | POA: Diagnosis not present

## 2022-09-19 DIAGNOSIS — G4733 Obstructive sleep apnea (adult) (pediatric): Secondary | ICD-10-CM | POA: Diagnosis not present

## 2022-09-21 ENCOUNTER — Encounter: Payer: Self-pay | Admitting: Nurse Practitioner

## 2022-09-21 ENCOUNTER — Ambulatory Visit: Payer: BC Managed Care – PPO | Admitting: Nurse Practitioner

## 2022-09-21 VITALS — BP 116/71 | HR 81 | Temp 98.0°F | Ht 65.0 in | Wt 334.8 lb

## 2022-09-21 DIAGNOSIS — Z0289 Encounter for other administrative examinations: Secondary | ICD-10-CM

## 2022-09-21 DIAGNOSIS — F419 Anxiety disorder, unspecified: Secondary | ICD-10-CM | POA: Diagnosis not present

## 2022-09-21 DIAGNOSIS — Z111 Encounter for screening for respiratory tuberculosis: Secondary | ICD-10-CM

## 2022-09-21 DIAGNOSIS — Z9229 Personal history of other drug therapy: Secondary | ICD-10-CM | POA: Diagnosis not present

## 2022-09-21 DIAGNOSIS — Z789 Other specified health status: Secondary | ICD-10-CM

## 2022-09-21 DIAGNOSIS — F32A Depression, unspecified: Secondary | ICD-10-CM

## 2022-09-21 NOTE — Assessment & Plan Note (Signed)
Stable with no current medications. Denies SI/HI.  Continue to monitor and restart medication as needed.

## 2022-09-21 NOTE — Progress Notes (Signed)
BP 116/71   Pulse 81   Temp 98 F (36.7 C) (Oral)   Ht 5\' 5"  (1.651 m)   Wt (!) 334 lb 12.8 oz (151.9 kg)   LMP 09/13/2022 (Exact Date)   SpO2 98%   BMI 55.71 kg/m    Subjective:    Patient ID: Diana Sexton, female    DOB: [birth year], 37 y.o.   MRN: [id number]  HPI: Diana Sexton is a 37 y.o. female  Chief Complaint  Patient presents with   Forms    Presents for forms to be filled out for workplace and need of immune status on Hep B and MMR + TB.  Has had immunizations, not available.  DEPRESSION/ANXIETY No current medications. Mood status: stable Satisfied with current treatment?: yes Symptom severity: mild  Duration of current treatment : chronic Depressed mood: no Anxious mood: no Anhedonia: no Significant weight loss or gain: no Insomnia: occasional Fatigue: no Feelings of worthlessness or guilt: no Impaired concentration/indecisiveness: no Suicidal ideations: no Hopelessness: no Crying spells: no    09/21/2022    8:25 AM 07/25/2022    2:24 PM 06/14/2022    9:14 AM 05/02/2022    8:45 AM 03/31/2022    8:23 AM  Depression screen PHQ 2/9  Decreased Interest 1 0 1 0 0  Down, Depressed, Hopeless 0 0 1 0 0  PHQ - 2 Score 1 0 2 0 0  Altered sleeping 1 2 0 0 2  Tired, decreased energy 1 1 1 1 1   Change in appetite 1 1 2 1 1   Feeling bad or failure about yourself  0 0 1 0 1  Trouble concentrating 0 0 1 0 0  Moving slowly or fidgety/restless 0 0 0 0 0  Suicidal thoughts 0 0 0 0 0  PHQ-9 Score 4 4 7 2 5   Difficult doing work/chores Not difficult at all  Somewhat difficult Not difficult at all Not difficult at all      09/21/2022    8:26 AM 07/25/2022    2:24 PM 06/14/2022    9:14 AM 05/02/2022    8:46 AM  GAD 7 : Generalized Anxiety Score  Nervous, Anxious, on Edge 1 1 1 1   Control/stop worrying 0 0 0 0  Worry too much - different things 0 0 1 0  Trouble relaxing 0 1 0 0  Restless 0 0 0 0  Easily annoyed or irritable 0 0 0 0  Afraid - awful might happen  0 0 0 0  Total GAD 7 Score 1 2 2 1   Anxiety Difficulty Not difficult at all  Not difficult at all Not difficult at all       Relevant past medical, surgical, family and social history reviewed and updated as indicated. Interim medical history since our last visit reviewed. Allergies and medications reviewed and updated.  Review of Systems  Constitutional:  Negative for activity change, appetite change, diaphoresis, fatigue and fever.  Respiratory:  Negative for cough, chest tightness and shortness of breath.   Cardiovascular:  Negative for chest pain, palpitations and leg swelling.  Gastrointestinal: Negative.   Neurological: Negative.   Psychiatric/Behavioral: Negative.      Per HPI unless specifically indicated above     Objective:    BP 116/71   Pulse 81   Temp 98 F (36.7 C) (Oral)   Ht 5\' 5"  (1.651 m)   Wt (!) 334 lb 12.8 oz (151.9 kg)   LMP 09/13/2022 (Exact Date)  SpO2 98%   BMI 55.71 kg/m   Wt Readings from Last 3 Encounters:  09/21/22 (!) 334 lb 12.8 oz (151.9 kg)  07/25/22 (!) 335 lb 3.2 oz (152 kg)  06/14/22 (!) 328 lb 6.4 oz (149 kg)    Physical Exam Vitals and nursing note reviewed.  Constitutional:      General: She is awake. She is not in acute distress.    Appearance: She is well-developed and well-groomed. She is obese. She is not ill-appearing or toxic-appearing.  HENT:     Head: Normocephalic.     Right Ear: Hearing and external ear normal.     Left Ear: Hearing and external ear normal.     Ears:     Comments: Whisper test passed both ears. Eyes:     General: Lids are normal.        Right eye: No discharge.        Left eye: No discharge.     Extraocular Movements: Extraocular movements intact.     Conjunctiva/sclera: Conjunctivae normal.     Pupils: Pupils are equal, round, and reactive to light.     Visual Fields: Right eye visual fields normal and left eye visual fields normal.  Neck:     Vascular: No carotid bruit.  Cardiovascular:      Rate and Rhythm: Normal rate and regular rhythm.     Heart sounds: Normal heart sounds. No murmur heard.    No gallop.  Pulmonary:     Effort: Pulmonary effort is normal. No accessory muscle usage or respiratory distress.     Breath sounds: Normal breath sounds. No decreased breath sounds, wheezing or rhonchi.  Abdominal:     General: Bowel sounds are normal. There is no distension.     Palpations: Abdomen is soft.     Tenderness: There is no abdominal tenderness.  Musculoskeletal:     Cervical back: Normal range of motion and neck supple.     Right lower leg: No edema.     Left lower leg: No edema.  Skin:    General: Skin is warm and dry.  Neurological:     Mental Status: She is alert and oriented to person, place, and time.     Deep Tendon Reflexes: Reflexes are normal and symmetric.     Reflex Scores:      Brachioradialis reflexes are 2+ on the right side and 2+ on the left side.      Patellar reflexes are 2+ on the right side and 2+ on the left side. Psychiatric:        Attention and Perception: Attention normal.        Mood and Affect: Mood normal.        Speech: Speech normal.        Behavior: Behavior normal. Behavior is cooperative.        Thought Content: Thought content normal.    Results for orders placed or performed in visit on 05/02/22  Microscopic Examination   Urine  Result Value Ref Range   WBC, UA 0-5 0 - 5 /hpf   RBC, Urine 0-2 0 - 2 /hpf   Epithelial Cells (non renal) 0-10 0 - 10 /hpf   Bacteria, UA None seen None seen/Few  CBC with Differential/Platelet  Result Value Ref Range   WBC 8.5 3.4 - 10.8 x10E3/uL   RBC 5.15 3.77 - 5.28 x10E6/uL   Hemoglobin 13.4 11.1 - 15.9 g/dL   Hematocrit 96.0 45.4 - 46.6 %  MCV 79 79 - 97 fL   MCH 26.0 (L) 26.6 - 33.0 pg   MCHC 32.8 31.5 - 35.7 g/dL   RDW 16.1 (H) 09.6 - 04.5 %   Platelets 428 150 - 450 x10E3/uL   Neutrophils 67 Not Estab. %   Lymphs 23 Not Estab. %   Monocytes 7 Not Estab. %   Eos 2 Not Estab.  %   Basos 1 Not Estab. %   Neutrophils Absolute 5.7 1.4 - 7.0 x10E3/uL   Lymphocytes Absolute 1.9 0.7 - 3.1 x10E3/uL   Monocytes Absolute 0.6 0.1 - 0.9 x10E3/uL   EOS (ABSOLUTE) 0.2 0.0 - 0.4 x10E3/uL   Basophils Absolute 0.1 0.0 - 0.2 x10E3/uL   Immature Granulocytes 0 Not Estab. %   Immature Grans (Abs) 0.0 0.0 - 0.1 x10E3/uL  Comprehensive metabolic panel  Result Value Ref Range   Glucose 95 70 - 99 mg/dL   BUN 9 6 - 20 mg/dL   Creatinine, Ser 4.09 (L) 0.57 - 1.00 mg/dL   eGFR 811 >91 YN/WGN/5.62   BUN/Creatinine Ratio 16 9 - 23   Sodium 140 134 - 144 mmol/L   Potassium 4.0 3.5 - 5.2 mmol/L   Chloride 101 96 - 106 mmol/L   CO2 22 20 - 29 mmol/L   Calcium 9.3 8.7 - 10.2 mg/dL   Total Protein 7.0 6.0 - 8.5 g/dL   Albumin 4.1 3.9 - 4.9 g/dL   Globulin, Total 2.9 1.5 - 4.5 g/dL   Albumin/Globulin Ratio 1.4 1.2 - 2.2   Bilirubin Total 0.4 0.0 - 1.2 mg/dL   Alkaline Phosphatase 85 44 - 121 IU/L   AST 19 0 - 40 IU/L   ALT 22 0 - 32 IU/L  Lipid panel  Result Value Ref Range   Cholesterol, Total 220 (H) 100 - 199 mg/dL   Triglycerides 130 0 - 149 mg/dL   HDL 42 >86 mg/dL   VLDL Cholesterol Cal 24 5 - 40 mg/dL   LDL Chol Calc (NIH) 578 (H) 0 - 99 mg/dL   Chol/HDL Ratio 5.2 (H) 0.0 - 4.4 ratio  TSH  Result Value Ref Range   TSH 1.470 0.450 - 4.500 uIU/mL  Urinalysis, Routine w reflex microscopic  Result Value Ref Range   Specific Gravity, UA >1.030 (H) 1.005 - 1.030   pH, UA 5.5 5.0 - 7.5   Color, UA Yellow Yellow   Appearance Ur Clear Clear   Leukocytes,UA Negative Negative   Protein,UA 1+ (A) Negative/Trace   Glucose, UA Negative Negative   Ketones, UA Negative Negative   RBC, UA 1+ (A) Negative   Bilirubin, UA Negative Negative   Urobilinogen, Ur 0.2 0.2 - 1.0 mg/dL   Nitrite, UA Negative Negative   Microscopic Examination See below:   HgB A1c  Result Value Ref Range   Hgb A1c MFr Bld 5.9 (H) 4.8 - 5.6 %   Est. average glucose Bld gHb Est-mCnc 123 mg/dL  HIV  Antibody (routine testing w rflx)  Result Value Ref Range   HIV Screen 4th Generation wRfx Non Reactive Non Reactive  Hepatitis C Antibody  Result Value Ref Range   Hep C Virus Ab Non Reactive Non Reactive  Cytology - PAP  Result Value Ref Range   Amendment DIAGNOSTIC AMENDMENT:    Amendment      ATTENTION: REVISED DIAGNOSIS. THIS MAY AFFECT DIAGNOSTIC AND/OR   Amendment      THERAPUTIC DECISIONS. The final report should read BENIGN   Amendment  REACTIVE/REPARATIVE CHANGES INSTEAD OF ATYPICAL CELLS OF UNDETERMINDED   Amendment      SIGNIFICANCE (ASCUS). This has been corrected. This revised report was   Amendment      called to [place] by Jessy Oto on 05/08/2022. (initials:   Amendment sa/date: 05/08/2022)    Adequacy      Satisfactory for evaluation; transformation zone component PRESENT.   Diagnosis - Benign reactive/reparative changes       Assessment & Plan:   Problem List Items Addressed This Visit       Other   Anxiety and depression - Primary    Stable with no current medications. Denies SI/HI.  Continue to monitor and restart medication as needed.      Other Visit Diagnoses     Measles, mumps, rubella (MMR) vaccination status unknown       Check MMR status on labs today.   Relevant Orders   Measles/Mumps/Rubella Immunity   Screening-pulmonary TB       Check TB status on labs today.   Relevant Orders   QuantiFERON-TB Gold Plus   Up to date with hepatitis B immunization       Has had immunizations, not in records.  Check status on labs today.   Relevant Orders   Hepatitis B surface antibody,quantitative   Encounter for completion of form with patient       Reviewed work form with patient, will obtain all immunity status labs and complete form once returned.        Follow up plan: Return if symptoms worsen or fail to improve.

## 2022-09-21 NOTE — Patient Instructions (Signed)

## 2022-09-22 ENCOUNTER — Encounter: Payer: Self-pay | Admitting: Nurse Practitioner

## 2022-09-22 NOTE — Progress Notes (Signed)
Contacted via MyChart -- will need nurse visit for MMR vaccine.   Good afternoon Diana Sexton, your MMR titer and Hep B have returned so far.  Hep B you are immune.  MMR you are not immune to rubella, would recommend a repeat MMR vaccine in office be given.  Waiting on TB testing.  Any questions?

## 2022-09-24 NOTE — Progress Notes (Signed)
All labs are back [place].  I contacted her via MyChart and let front desk know she needs nurse visit for MMR vaccine.  Can you pull her sheet I need to fill out and I will complete so we can fax or she can pick up.  Thank you!!

## 2022-09-25 NOTE — Progress Notes (Signed)
Called and scheduled patient on 09/29/2022 @ 8:00 for MMR vaccine.

## 2022-09-27 ENCOUNTER — Encounter: Payer: Self-pay | Admitting: Nurse Practitioner

## 2022-09-27 DIAGNOSIS — E049 Nontoxic goiter, unspecified: Secondary | ICD-10-CM | POA: Diagnosis not present

## 2022-09-29 ENCOUNTER — Ambulatory Visit (INDEPENDENT_AMBULATORY_CARE_PROVIDER_SITE_OTHER): Payer: BC Managed Care – PPO

## 2022-09-29 DIAGNOSIS — Z23 Encounter for immunization: Secondary | ICD-10-CM

## 2022-10-13 DIAGNOSIS — G4733 Obstructive sleep apnea (adult) (pediatric): Secondary | ICD-10-CM | POA: Diagnosis not present

## 2022-10-17 DIAGNOSIS — M9904 Segmental and somatic dysfunction of sacral region: Secondary | ICD-10-CM | POA: Diagnosis not present

## 2022-10-17 DIAGNOSIS — M9903 Segmental and somatic dysfunction of lumbar region: Secondary | ICD-10-CM | POA: Diagnosis not present

## 2022-10-17 DIAGNOSIS — S336XXA Sprain of sacroiliac joint, initial encounter: Secondary | ICD-10-CM | POA: Diagnosis not present

## 2022-10-17 DIAGNOSIS — M545 Low back pain, unspecified: Secondary | ICD-10-CM | POA: Diagnosis not present

## 2022-10-28 DIAGNOSIS — S86111A Strain of other muscle(s) and tendon(s) of posterior muscle group at lower leg level, right leg, initial encounter: Secondary | ICD-10-CM | POA: Diagnosis not present

## 2022-11-01 DIAGNOSIS — M545 Low back pain, unspecified: Secondary | ICD-10-CM | POA: Diagnosis not present

## 2022-11-01 DIAGNOSIS — M9903 Segmental and somatic dysfunction of lumbar region: Secondary | ICD-10-CM | POA: Diagnosis not present

## 2022-11-01 DIAGNOSIS — S336XXA Sprain of sacroiliac joint, initial encounter: Secondary | ICD-10-CM | POA: Diagnosis not present

## 2022-11-01 DIAGNOSIS — M9904 Segmental and somatic dysfunction of sacral region: Secondary | ICD-10-CM | POA: Diagnosis not present

## 2022-11-13 DIAGNOSIS — G4733 Obstructive sleep apnea (adult) (pediatric): Secondary | ICD-10-CM | POA: Diagnosis not present

## 2022-11-15 DIAGNOSIS — Z6841 Body Mass Index (BMI) 40.0 and over, adult: Secondary | ICD-10-CM | POA: Diagnosis not present

## 2022-11-15 DIAGNOSIS — D6852 Prothrombin gene mutation: Secondary | ICD-10-CM | POA: Diagnosis not present

## 2022-11-15 DIAGNOSIS — G4733 Obstructive sleep apnea (adult) (pediatric): Secondary | ICD-10-CM | POA: Diagnosis not present

## 2022-11-22 DIAGNOSIS — M545 Low back pain, unspecified: Secondary | ICD-10-CM | POA: Diagnosis not present

## 2022-11-22 DIAGNOSIS — M9903 Segmental and somatic dysfunction of lumbar region: Secondary | ICD-10-CM | POA: Diagnosis not present

## 2022-11-22 DIAGNOSIS — M9904 Segmental and somatic dysfunction of sacral region: Secondary | ICD-10-CM | POA: Diagnosis not present

## 2022-11-22 DIAGNOSIS — S336XXA Sprain of sacroiliac joint, initial encounter: Secondary | ICD-10-CM | POA: Diagnosis not present

## 2022-11-23 DIAGNOSIS — E042 Nontoxic multinodular goiter: Secondary | ICD-10-CM | POA: Diagnosis not present

## 2022-11-23 DIAGNOSIS — E041 Nontoxic single thyroid nodule: Secondary | ICD-10-CM | POA: Diagnosis not present

## 2022-11-23 DIAGNOSIS — E049 Nontoxic goiter, unspecified: Secondary | ICD-10-CM | POA: Diagnosis not present

## 2022-11-23 DIAGNOSIS — E662 Morbid (severe) obesity with alveolar hypoventilation: Secondary | ICD-10-CM | POA: Diagnosis not present

## 2022-11-23 DIAGNOSIS — G4733 Obstructive sleep apnea (adult) (pediatric): Secondary | ICD-10-CM | POA: Diagnosis not present

## 2022-11-23 DIAGNOSIS — I82409 Acute embolism and thrombosis of unspecified deep veins of unspecified lower extremity: Secondary | ICD-10-CM | POA: Diagnosis not present

## 2022-11-23 DIAGNOSIS — D6852 Prothrombin gene mutation: Secondary | ICD-10-CM | POA: Diagnosis not present

## 2022-11-23 DIAGNOSIS — Z6841 Body Mass Index (BMI) 40.0 and over, adult: Secondary | ICD-10-CM | POA: Diagnosis not present

## 2022-11-29 DIAGNOSIS — T8149XA Infection following a procedure, other surgical site, initial encounter: Secondary | ICD-10-CM | POA: Diagnosis not present

## 2022-11-29 DIAGNOSIS — E890 Postprocedural hypothyroidism: Secondary | ICD-10-CM | POA: Diagnosis not present

## 2022-12-13 DIAGNOSIS — G4733 Obstructive sleep apnea (adult) (pediatric): Secondary | ICD-10-CM | POA: Diagnosis not present

## 2022-12-25 DIAGNOSIS — G4733 Obstructive sleep apnea (adult) (pediatric): Secondary | ICD-10-CM | POA: Diagnosis not present

## 2022-12-30 ENCOUNTER — Other Ambulatory Visit: Payer: Self-pay

## 2022-12-30 ENCOUNTER — Emergency Department: Payer: BC Managed Care – PPO

## 2022-12-30 ENCOUNTER — Emergency Department
Admission: EM | Admit: 2022-12-30 | Discharge: 2022-12-30 | Disposition: A | Discharge status: Home / Self Care | Payer: BC Managed Care – PPO | Attending: Student in an Organized Health Care Education/Training Program | Admitting: Student in an Organized Health Care Education/Training Program

## 2022-12-30 DIAGNOSIS — R600 Localized edema: Secondary | ICD-10-CM | POA: Diagnosis not present

## 2022-12-30 DIAGNOSIS — I82612 Acute embolism and thrombosis of superficial veins of left upper extremity: Secondary | ICD-10-CM | POA: Insufficient documentation

## 2022-12-30 DIAGNOSIS — M7989 Other specified soft tissue disorders: Secondary | ICD-10-CM | POA: Diagnosis not present

## 2022-12-30 DIAGNOSIS — M79602 Pain in left arm: Secondary | ICD-10-CM | POA: Diagnosis not present

## 2022-12-30 DIAGNOSIS — I82622 Acute embolism and thrombosis of deep veins of left upper extremity: Secondary | ICD-10-CM | POA: Diagnosis not present

## 2022-12-30 DIAGNOSIS — I808 Phlebitis and thrombophlebitis of other sites: Secondary | ICD-10-CM | POA: Diagnosis not present

## 2022-12-30 MED ORDER — KETOPROFEN ER 200 MG PO CP24: 200.0000 mg | ORAL_CAPSULE | Freq: Every day | ORAL | 0 refills | Status: DC

## 2022-12-30 MED ORDER — KETOPROFEN ER 200 MG PO CP24: 200.0000 mg | ORAL_CAPSULE | Freq: Once | ORAL | Status: DC

## 2022-12-30 MED ORDER — MELOXICAM 15 MG PO TABS: 15.0000 mg | ORAL_TABLET | Freq: Every day | ORAL | 0 refills | Status: DC

## 2022-12-30 MED ORDER — MELOXICAM 7.5 MG PO TABS
15.0000 mg | ORAL_TABLET | Freq: Once | ORAL | Status: AC
  Administered 2022-12-30: 15 mg via ORAL
  Filled 2022-12-30: qty 2

## 2022-12-30 NOTE — ED Triage Notes (Signed)
Pt to ED left arm pain and swelling started yesterday. Denies blood thinner use. Hx blood clot in arm and clavicle. States arm feels "tight"

## 2022-12-30 NOTE — ED Provider Notes (Signed)
[facility] Provider Note  Patient Contact: 8:49 PM (approximate)   History   No chief complaint on file.   HPI  Diana Sexton is a 37 y.o. female who presents the emergency department complaining of pain, swelling of the left arm.  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  She has a history of a prothrombin gene mutation but is negative for clotting disorders.  Patient has had provoked DVTs in the past, never an unprovoked DVT.  Patient is not on anticoagulation.  Patient did have a recent lab drawl to the affected area     Physical Exam   Triage Vital Signs: ED Triage Vitals  Encounter Vitals Group     BP 12/30/22 1758 (!) 151/73     Systolic BP Percentile --      Diastolic BP Percentile --      Pulse Rate 12/30/22 1758 78     Resp 12/30/22 1758 17     Temp 12/30/22 1758 97.7 F (36.5 C)     Temp Source 12/30/22 1758 Oral     SpO2 12/30/22 1758 99 %     Weight 12/30/22 1756 (!) 330 lb (149.7 kg)     Height 12/30/22 1756 5\' 5"  (1.651 m)     Head Circumference --      Peak Flow --      Pain Score 12/30/22 1756 6     Pain Loc --      Pain Education --      Exclude from Growth Chart --     Most recent vital signs: Vitals:   12/30/22 1758  BP: (!) 151/73  Pulse: 78  Resp: 17  Temp: 97.7 F (36.5 C)  SpO2: 99%     General: Alert and in no acute distress.  Cardiovascular:  Good peripheral perfusion Respiratory: Normal respiratory effort without tachypnea or retractions. Lungs CTAB.  Musculoskeletal: Full range of motion to all extremities.  Visualization of the left upper extremity reveals slight edema and mild erythema to the left forearm along the anterior aspect.  This area is tender and firm to palpation.  No cellulitic changes overlying the area.  Pulses and sensation intact distally Neurologic:  No gross focal neurologic deficits are appreciated.  Skin:   No rash noted Other:   ED Results /  Procedures / Treatments   Labs (all labs ordered are listed, but only abnormal results are displayed) Labs Reviewed - No data to display   EKG     RADIOLOGY  I personally viewed, evaluated, and interpreted these images as part of my medical decision making, as well as reviewing the written report by the radiologist.  ED Provider Interpretation: Superficial venous thrombosis without evidence of DVT.  US Venous Img Upper Uni Left  Result Date: 12/30/2022 CLINICAL DATA:  Left upper extremity pain and edema. History of previous DVT. Evaluate for DVT. EXAM: LEFT UPPER EXTREMITY VENOUS DOPPLER ULTRASOUND TECHNIQUE: Gray-scale sonography with graded compression, as well as color Doppler and duplex ultrasound were performed to evaluate the upper extremity deep venous system from the level of the subclavian vein and including the jugular, axillary, basilic, radial, ulnar and upper cephalic vein. Spectral Doppler was utilized to evaluate flow at rest and with distal augmentation maneuvers. COMPARISON:  None Available. FINDINGS: Contralateral Subclavian Vein: Respiratory phasicity is normal and symmetric with the symptomatic side. No evidence of thrombus. Normal compressibility. Internal Jugular Vein: No evidence of thrombus. Normal  compressibility, respiratory phasicity and response to augmentation. Subclavian Vein: No evidence of thrombus. Normal compressibility, respiratory phasicity and response to augmentation. Axillary Vein: No evidence of thrombus. Normal compressibility, respiratory phasicity and response to augmentation. Cephalic Vein: No evidence of thrombus. Normal compressibility, respiratory phasicity and response to augmentation. Basilic Vein: While the basilic vein appears patent at the level of the upper arm, there is hypoechoic occlusive thrombus involving basilic vein at the level of the forearm (images 33 and 34; 36 and 37). Brachial Veins: No evidence of thrombus. Normal  compressibility, respiratory phasicity and response to augmentation. Radial Veins: No evidence of thrombus. Normal compressibility, respiratory phasicity and response to augmentation. Ulnar Veins: No evidence of thrombus. Normal compressibility, respiratory phasicity and response to augmentation. Other Findings:  None visualized. IMPRESSION: 1. The examination is positive for occlusive superficial thrombophlebitis involving the cephalic vein basilic vein at the level of the forearm. The basilic vein appears patent at the level of the upper arm. 2. No evidence of DVT within the left upper extremity. Electronically Signed   By: Simonne Come M.D.   On: 12/30/2022 18:59    PROCEDURES:  Critical Care performed: No  Procedures   MEDICATIONS ORDERED IN ED: Medications  meloxicam (MOBIC) tablet 15 mg (15 mg Oral Given 12/30/22 2118)     IMPRESSION / MDM / ASSESSMENT AND PLAN / ED COURSE  I reviewed the triage vital signs and the nursing notes.                                 Differential diagnosis includes, but is not limited to, thrombophlebitis, cellulitis, DVT   Patient's presentation is most consistent with acute presentation with potential threat to life or bodily function.   Patient's diagnosis is consistent with thrombophlebitis.  Patient presents with erythema and edema of the forearm.  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  Ultrasound of the patient's left upper extremity reveals superficial thrombophlebitis without evidence of DVT.  At this time anti-inflammatories and warm compresses will be used for management.  Patient does have a history of prothrombin gene mutation.  No history of actual clotting disorders.  No indication for anticoagulation.  Concerning signs and symptoms are discussed with the patient.  Follow-up with primary care as needed.. Patient is given ED precautions to return to the ED for any worsening or new symptoms.     FINAL CLINICAL  IMPRESSION(S) / ED DIAGNOSES   Final diagnoses:  Superficial venous thrombosis of left upper extremity     Rx / DC Orders   ED Discharge Orders          Ordered    Ketoprofen CR (KETOPROFEN CR) 200 MG CP24 capsule SR 24 hr  Daily        12/30/22 2111    meloxicam (MOBIC) 15 MG tablet  Daily        12/30/22 2116             Note:  This document was prepared using Dragon voice recognition software and may include unintentional dictation errors.   Lanette Hampshire 12/30/22 2205    Willy Eddy, MD 12/31/22 1058

## 2023-01-08 DIAGNOSIS — M9903 Segmental and somatic dysfunction of lumbar region: Secondary | ICD-10-CM | POA: Diagnosis not present

## 2023-01-08 DIAGNOSIS — S336XXA Sprain of sacroiliac joint, initial encounter: Secondary | ICD-10-CM | POA: Diagnosis not present

## 2023-01-08 DIAGNOSIS — M545 Low back pain, unspecified: Secondary | ICD-10-CM | POA: Diagnosis not present

## 2023-01-08 DIAGNOSIS — M9904 Segmental and somatic dysfunction of sacral region: Secondary | ICD-10-CM | POA: Diagnosis not present

## 2023-01-10 DIAGNOSIS — E049 Nontoxic goiter, unspecified: Secondary | ICD-10-CM | POA: Diagnosis not present

## 2023-01-10 DIAGNOSIS — Z09 Encounter for follow-up examination after completed treatment for conditions other than malignant neoplasm: Secondary | ICD-10-CM | POA: Diagnosis not present

## 2023-01-13 DIAGNOSIS — G4733 Obstructive sleep apnea (adult) (pediatric): Secondary | ICD-10-CM | POA: Diagnosis not present

## 2023-01-18 ENCOUNTER — Telehealth: Payer: Self-pay

## 2023-01-18 NOTE — Telephone Encounter (Signed)
Transition Care Management Unsuccessful Follow-up Telephone Call  Date of discharge and from where:  12/30/2022 [facility]  Attempts:  1st Attempt  Reason for unsuccessful TCM follow-up call:  Left voice message  English Tomer [facility]  [facility], [facility] Resource Care Guide Direct Dial: [phone]  Website: [email or web address]

## 2023-01-19 ENCOUNTER — Telehealth: Payer: Self-pay

## 2023-01-19 NOTE — Telephone Encounter (Signed)
Transition Care Management Unsuccessful Follow-up Telephone Call  Date of discharge and from where:  12/30/2022 [facility]  Attempts:  2nd Attempt  Reason for unsuccessful TCM follow-up call:  Left voice message  Mikya Don [facility]  [facility], [facility] Resource Care Guide Direct Dial: [phone]  Website: [email or web address]

## 2023-01-23 DIAGNOSIS — G4733 Obstructive sleep apnea (adult) (pediatric): Secondary | ICD-10-CM | POA: Diagnosis not present

## 2023-01-24 ENCOUNTER — Ambulatory Visit (INDEPENDENT_AMBULATORY_CARE_PROVIDER_SITE_OTHER): Payer: BC Managed Care – PPO | Admitting: Nurse Practitioner

## 2023-01-24 ENCOUNTER — Encounter: Payer: Self-pay | Admitting: Nurse Practitioner

## 2023-01-24 DIAGNOSIS — R7303 Prediabetes: Secondary | ICD-10-CM | POA: Diagnosis not present

## 2023-01-24 MED ORDER — ZEPBOUND 2.5 MG/0.5ML ~~LOC~~ SOAJ: 2.5000 mg | SUBCUTANEOUS | 0 refills | Status: DC

## 2023-01-24 MED ORDER — ZEPBOUND 5 MG/0.5ML ~~LOC~~ SOAJ: 5.0000 mg | SUBCUTANEOUS | 0 refills | Status: DC

## 2023-01-24 NOTE — Assessment & Plan Note (Signed)
Recommended eating smaller high protein, low fat meals more frequently and exercising 30 mins a day 5 times a week with a goal of 10-15lb weight loss in the next 3 months.  Patient has tried diet and exercise for weight loss without success.  Will start Zepbound 2.5mg  weekly.  Will increase to Zepbound 5mg  weekly after the first 4 weeks.  Discussed how to inject medication.  Discussed side effects and benefits of medication.  Follow up in 6 weeks.  Call sooner if concerns arise.

## 2023-01-24 NOTE — Progress Notes (Signed)
BP (!) 155/97 (BP Location: Right Wrist, Patient Position: Sitting, Cuff Size: Normal)   Pulse 72   Temp 97.8 F (36.6 C) (Oral)   Ht 5\' 5"  (1.651 m)   Wt (!) 333 lb 6.4 oz (151.2 kg)   SpO2 97%   BMI 55.48 kg/m    Subjective:    Patient ID: Diana Sexton, female    DOB: September 01, 1985, 37 y.o.   MRN: [id number]  HPI: Diana Sexton is a 37 y.o. female  Chief Complaint  Patient presents with   Follow-up   Hyperlipidemia   Weight Management Screening   DEPRESSION/ANXIETY No current medications.  She hasn't been taking the Wellbutrin regularly.  Her stress revolves around her daughter.  Mood status: stable Satisfied with current treatment?: yes Symptom severity: mild  Duration of current treatment : chronic Depressed mood: no Anxious mood: no Anhedonia: no Significant weight loss or gain: no Insomnia: occasional Fatigue: no Feelings of worthlessness or guilt: no Impaired concentration/indecisiveness: no Suicidal ideations: no Hopelessness: no Crying spells: no    01/24/2023    9:03 AM 09/21/2022    8:25 AM 07/25/2022    2:24 PM 06/14/2022    9:14 AM 05/02/2022    8:45 AM  Depression screen PHQ 2/9  Decreased Interest 0 1 0 1 0  Down, Depressed, Hopeless 0 0 0 1 0  PHQ - 2 Score 0 1 0 2 0  Altered sleeping 1 1 2  0 0  Tired, decreased energy 1 1 1 1 1   Change in appetite 1 1 1 2 1   Feeling bad or failure about yourself  0 0 0 1 0  Trouble concentrating 0 0 0 1 0  Moving slowly or fidgety/restless 0 0 0 0 0  Suicidal thoughts 0 0 0 0 0  PHQ-9 Score 3 4 4 7 2   Difficult doing work/chores  Not difficult at all  Somewhat difficult Not difficult at all      01/24/2023    9:03 AM 09/21/2022    8:26 AM 07/25/2022    2:24 PM 06/14/2022    9:14 AM  GAD 7 : Generalized Anxiety Score  Nervous, Anxious, on Edge 0 1 1 1   Control/stop worrying 0 0 0 0  Worry too much - different things 0 0 0 1  Trouble relaxing 0 0 1 0  Restless 0 0 0 0  Easily annoyed or irritable 0 0  0 0  Afraid - awful might happen 0 0 0 0  Total GAD 7 Score 0 1 2 2   Anxiety Difficulty  Not difficult at all  Not difficult at all   WEIGHT GAIN Duration: years  Previous attempts at weight loss: yes Complications of obesity: prediabetes Peak weight: 333lb Weight loss goal:  Weight loss to date:  Requesting obesity pharmacotherapy: yes Current weight loss supplements/medications: no Previous weight loss supplements/meds: wegovy Calories:    Relevant past medical, surgical, family and social history reviewed and updated as indicated. Interim medical history since our last visit reviewed. Allergies and medications reviewed and updated.  Review of Systems  Constitutional:  Positive for unexpected weight change. Negative for activity change, appetite change, diaphoresis, fatigue and fever.  Respiratory:  Negative for cough, chest tightness and shortness of breath.   Cardiovascular:  Negative for chest pain, palpitations and leg swelling.  Gastrointestinal: Negative.   Neurological: Negative.   Psychiatric/Behavioral: Negative.      Per HPI unless specifically indicated above     Objective:  BP (!) 155/97 (BP Location: Right Wrist, Patient Position: Sitting, Cuff Size: Normal)   Pulse 72   Temp 97.8 F (36.6 C) (Oral)   Ht 5\' 5"  (1.651 m)   Wt (!) 333 lb 6.4 oz (151.2 kg)   SpO2 97%   BMI 55.48 kg/m   Wt Readings from Last 3 Encounters:  01/24/23 (!) 333 lb 6.4 oz (151.2 kg)  12/30/22 (!) 330 lb (149.7 kg)  09/21/22 (!) 334 lb 12.8 oz (151.9 kg)    Physical Exam Vitals and nursing note reviewed.  Constitutional:      General: She is not in acute distress.    Appearance: Normal appearance. She is obese. She is not ill-appearing, toxic-appearing or diaphoretic.  HENT:     Head: Normocephalic.     Right Ear: External ear normal.     Left Ear: External ear normal.     Nose: Nose normal.     Mouth/Throat:     Mouth: Mucous membranes are moist.     Pharynx:  Oropharynx is clear.  Eyes:     General:        Right eye: No discharge.        Left eye: No discharge.     Extraocular Movements: Extraocular movements intact.     Conjunctiva/sclera: Conjunctivae normal.     Pupils: Pupils are equal, round, and reactive to light.  Cardiovascular:     Rate and Rhythm: Normal rate and regular rhythm.     Heart sounds: No murmur heard. Pulmonary:     Effort: Pulmonary effort is normal. No respiratory distress.     Breath sounds: Normal breath sounds. No wheezing or rales.  Musculoskeletal:     Cervical back: Normal range of motion and neck supple.  Skin:    General: Skin is warm and dry.     Capillary Refill: Capillary refill takes less than 2 seconds.  Neurological:     General: No focal deficit present.     Mental Status: She is alert and oriented to person, place, and time. Mental status is at baseline.  Psychiatric:        Mood and Affect: Mood normal.        Behavior: Behavior normal.        Thought Content: Thought content normal.        Judgment: Judgment normal.    Results for orders placed or performed in visit on 09/21/22  QuantiFERON-TB Gold Plus  Result Value Ref Range   QuantiFERON Incubation Incubation performed.    QuantiFERON Criteria Comment    QuantiFERON TB1 Ag Value 0.03 IU/mL   QuantiFERON TB2 Ag Value 0.01 IU/mL   QuantiFERON Nil Value 0.00 IU/mL   QuantiFERON Mitogen Value >10.00 IU/mL   QuantiFERON-TB Gold Plus Negative Negative  Measles/Mumps/Rubella Immunity  Result Value Ref Range   Rubella Antibodies, IGG <0.90 (L) Immune >0.99 index   RUBEOLA AB, IGG 32.7 Immune >16.4 AU/mL   MUMPS ABS, IGG 25.0 Immune >10.9 AU/mL  Hepatitis B surface antibody,quantitative  Result Value Ref Range   Hepatitis B Surf Ab Quant 754.0 Immunity>10 mIU/mL      Assessment & Plan:   Problem List Items Addressed This Visit       Other   Morbid obesity (HCC) - Primary    Recommended eating smaller high protein, low fat meals  more frequently and exercising 30 mins a day 5 times a week with a goal of 10-15lb weight loss in the next 3 months.  Patient  has tried diet and exercise for weight loss without success.  Will start Zepbound 2.5mg  weekly.  Will increase to Zepbound 5mg  weekly after the first 4 weeks.  Discussed how to inject medication.  Discussed side effects and benefits of medication.  Follow up in 6 weeks.  Call sooner if concerns arise.         Relevant Medications   tirzepatide (ZEPBOUND) 5 MG/0.5ML Pen   tirzepatide (ZEPBOUND) 2.5 MG/0.5ML Pen   Prediabetes     Follow up plan: Return in about 6 weeks (around 03/07/2023) for Weight Managment.

## 2023-02-12 DIAGNOSIS — G4733 Obstructive sleep apnea (adult) (pediatric): Secondary | ICD-10-CM | POA: Diagnosis not present

## 2023-02-13 ENCOUNTER — Encounter: Payer: Self-pay | Admitting: Nurse Practitioner

## 2023-02-13 ENCOUNTER — Ambulatory Visit: Payer: BC Managed Care – PPO | Admitting: Nurse Practitioner

## 2023-02-13 VITALS — BP 138/76 | HR 77 | Temp 97.6°F | Ht 65.0 in | Wt 328.8 lb

## 2023-02-13 DIAGNOSIS — J029 Acute pharyngitis, unspecified: Secondary | ICD-10-CM

## 2023-02-13 DIAGNOSIS — H6690 Otitis media, unspecified, unspecified ear: Secondary | ICD-10-CM

## 2023-02-13 DIAGNOSIS — R5081 Fever presenting with conditions classified elsewhere: Secondary | ICD-10-CM

## 2023-02-13 MED ORDER — CEFDINIR 300 MG PO CAPS: 300.0000 mg | ORAL_CAPSULE | Freq: Two times a day (BID) | ORAL | 0 refills | Status: AC

## 2023-02-13 NOTE — Progress Notes (Signed)
 BP 138/76 (BP Location: Left Wrist, Patient Position: Sitting, Cuff Size: Normal)   Pulse 77   Temp 97.6 F (36.4 C) (Oral)   Ht 5' 5 (1.651 m)   Wt (!) 328 lb 12.8 oz (149.1 kg)   SpO2 99%   BMI 54.72 kg/m    Subjective:    Patient ID: Diana Sexton, female    DOB: 06-Sep-1985, 37 y.o.   MRN: [id number]  HPI: Diana Sexton is a 36 y.o. female  Chief Complaint  Patient presents with   Sore Throat    Has been going on for 4 days , prior treatment has been OTC flonase  and suddifed   Otitis Media    Pressure in bilateral ears due to congestion   UPPER RESPIRATORY TRACT INFECTION Worst symptom: symptoms x 4 days Fever: yes- on Saturday Cough: no Shortness of breath: no Wheezing: no Chest pain: no Chest tightness: no Chest congestion: no Nasal congestion: yes Runny nose: yes Post nasal drip: yes Sneezing: no Sore throat: yes Swollen glands: yes Sinus pressure: no Headache: no Face pain: no Toothache: no Ear pain: yes bilateral Ear pressure: yes bilateral Eyes red/itching:no Eye drainage/crusting: no  Vomiting: no Rash: no Fatigue: yes Sick contacts: no Strep contacts: no  Context: fluctuating Recurrent sinusitis: no Relief with OTC cold/cough medications: yes  Treatments attempted:  flonase  and pseudoephedrine   Relevant past medical, surgical, family and social history reviewed and updated as indicated. Interim medical history since our last visit reviewed. Allergies and medications reviewed and updated.  Review of Systems  Constitutional:  Positive for fatigue and fever.  HENT:  Positive for congestion, ear pain, postnasal drip, rhinorrhea and sore throat. Negative for dental problem, sinus pressure, sinus pain and sneezing.   Respiratory:  Positive for cough. Negative for shortness of breath and wheezing.   Cardiovascular:  Negative for chest pain.  Gastrointestinal:  Negative for vomiting.  Skin:  Negative for rash.  Neurological:  Negative  for headaches.    Per HPI unless specifically indicated above     Objective:    BP 138/76 (BP Location: Left Wrist, Patient Position: Sitting, Cuff Size: Normal)   Pulse 77   Temp 97.6 F (36.4 C) (Oral)   Ht 5' 5 (1.651 m)   Wt (!) 328 lb 12.8 oz (149.1 kg)   SpO2 99%   BMI 54.72 kg/m   Wt Readings from Last 3 Encounters:  02/13/23 (!) 328 lb 12.8 oz (149.1 kg)  01/24/23 (!) 333 lb 6.4 oz (151.2 kg)  12/30/22 (!) 330 lb (149.7 kg)    Physical Exam Vitals and nursing note reviewed.  Constitutional:      General: She is not in acute distress.    Appearance: Normal appearance. She is normal weight. She is not ill-appearing, toxic-appearing or diaphoretic.  HENT:     Head: Normocephalic.     Right Ear: External ear normal. A middle ear effusion is present. Tympanic membrane is erythematous.     Left Ear: External ear normal. A middle ear effusion is present. Tympanic membrane is erythematous.     Nose: Congestion and rhinorrhea present.     Mouth/Throat:     Mouth: Mucous membranes are moist.     Pharynx: Oropharynx is clear. Posterior oropharyngeal erythema present. No oropharyngeal exudate.  Eyes:     General:        Right eye: No discharge.        Left eye: No discharge.     Extraocular  Movements: Extraocular movements intact.     Conjunctiva/sclera: Conjunctivae normal.     Pupils: Pupils are equal, round, and reactive to light.  Cardiovascular:     Rate and Rhythm: Normal rate and regular rhythm.     Heart sounds: No murmur heard. Pulmonary:     Effort: Pulmonary effort is normal. No respiratory distress.     Breath sounds: Normal breath sounds. No wheezing or rales.  Musculoskeletal:     Cervical back: Normal range of motion and neck supple.  Skin:    General: Skin is warm and dry.     Capillary Refill: Capillary refill takes less than 2 seconds.  Neurological:     General: No focal deficit present.     Mental Status: She is alert and oriented to person,  place, and time. Mental status is at baseline.  Psychiatric:        Mood and Affect: Mood normal.        Behavior: Behavior normal.        Thought Content: Thought content normal.        Judgment: Judgment normal.     Results for orders placed or performed in visit on 09/21/22  QuantiFERON-TB Gold Plus   Collection Time: 09/21/22  8:39 AM  Result Value Ref Range   QuantiFERON Incubation Incubation performed.    QuantiFERON Criteria Comment    QuantiFERON TB1 Ag Value 0.03 IU/mL   QuantiFERON TB2 Ag Value 0.01 IU/mL   QuantiFERON Nil Value 0.00 IU/mL   QuantiFERON Mitogen Value >10.00 IU/mL   QuantiFERON-TB Gold Plus Negative Negative  Measles/Mumps/Rubella Immunity   Collection Time: 09/21/22  8:39 AM  Result Value Ref Range   Rubella Antibodies, IGG <0.90 (L) Immune >0.99 index   RUBEOLA AB, IGG 32.7 Immune >16.4 AU/mL   MUMPS ABS, IGG 25.0 Immune >10.9 AU/mL  Hepatitis B surface antibody,quantitative   Collection Time: 09/21/22  8:39 AM  Result Value Ref Range   Hepatitis B Surf Ab Quant 754.0 Immunity>10 mIU/mL      Assessment & Plan:   Problem List Items Addressed This Visit   None Visit Diagnoses       Acute otitis media, unspecified otitis media type    -  Primary   Flu and Strep neg in office. Will treat with cefdinir  BID x 10 days.  Follow up if symptoms not improved.   Relevant Medications   cefdinir  (OMNICEF ) 300 MG capsule     Sore throat       Relevant Orders   Rapid Strep screen([facility])     Fever in other diseases       Relevant Orders   Veritor Flu A/B Waived        Follow up plan: Return if symptoms worsen or fail to improve.

## 2023-02-16 LAB — VERITOR FLU A/B WAIVED
Influenza A: NEGATIVE
Influenza B: NEGATIVE

## 2023-02-16 LAB — CULTURE, GROUP A STREP: Strep A Culture: NEGATIVE

## 2023-02-16 LAB — RAPID STREP SCREEN (MED CTR MEBANE ONLY): Strep Gp A Ag, IA W/Reflex: NEGATIVE

## 2023-02-22 ENCOUNTER — Ambulatory Visit: Payer: BC Managed Care – PPO | Admitting: Nurse Practitioner

## 2023-02-22 ENCOUNTER — Encounter: Payer: Self-pay | Admitting: Nurse Practitioner

## 2023-02-22 VITALS — BP 134/75 | HR 75 | Temp 97.5°F | Ht 65.0 in | Wt 326.0 lb

## 2023-02-22 DIAGNOSIS — J029 Acute pharyngitis, unspecified: Secondary | ICD-10-CM

## 2023-02-22 MED ORDER — LIDOCAINE VISCOUS HCL 2 % MT SOLN: 15.0000 mL | OROMUCOSAL | 0 refills | Status: DC | PRN

## 2023-02-22 NOTE — Progress Notes (Signed)
 BP 134/75 (BP Location: Left Wrist, Patient Position: Sitting, Cuff Size: Normal)   Pulse 75   Temp (!) 97.5 F (36.4 C) (Oral)   Ht 5' 5 (1.651 m)   Wt (!) 326 lb (147.9 kg)   SpO2 99%   BMI 54.25 kg/m    Subjective:    Patient ID: Diana Sexton, female    DOB: Jan 20, 1986, 38 y.o.   MRN: [id number]  HPI: Diana Sexton is a 38 y.o. female  Chief Complaint  Patient presents with   throat pain    Feels swollen, last days on antibiotics, feels as if its getting worse, hard to swallow    UPPER RESPIRATORY TRACT INFECTION Patient states her throat is hurting worse.  Feels like it is swollen.  She is on her last day of her antibiotics.  Denies any fever.  Symptoms are worse at night.  Nasal congestion.  Denies cough.  No strep contacts.    Relevant past medical, surgical, family and social history reviewed and updated as indicated. Interim medical history since our last visit reviewed. Allergies and medications reviewed and updated.  Review of Systems  Constitutional:  Negative for fever.  HENT:  Positive for congestion and sore throat.   Respiratory:  Negative for cough.     Per HPI unless specifically indicated above     Objective:    BP 134/75 (BP Location: Left Wrist, Patient Position: Sitting, Cuff Size: Normal)   Pulse 75   Temp (!) 97.5 F (36.4 C) (Oral)   Ht 5' 5 (1.651 m)   Wt (!) 326 lb (147.9 kg)   SpO2 99%   BMI 54.25 kg/m   Wt Readings from Last 3 Encounters:  02/22/23 (!) 326 lb (147.9 kg)  02/13/23 (!) 328 lb 12.8 oz (149.1 kg)  01/24/23 (!) 333 lb 6.4 oz (151.2 kg)    Physical Exam Vitals and nursing note reviewed.  Constitutional:      General: She is not in acute distress.    Appearance: Normal appearance. She is normal weight. She is not ill-appearing, toxic-appearing or diaphoretic.  HENT:     Head: Normocephalic.     Right Ear: External ear normal.     Left Ear: External ear normal.     Nose: Congestion and rhinorrhea present.      Mouth/Throat:     Mouth: Mucous membranes are moist.     Pharynx: Oropharynx is clear. Posterior oropharyngeal erythema present. No oropharyngeal exudate.  Eyes:     General:        Right eye: No discharge.        Left eye: No discharge.     Extraocular Movements: Extraocular movements intact.     Conjunctiva/sclera: Conjunctivae normal.     Pupils: Pupils are equal, round, and reactive to light.  Cardiovascular:     Rate and Rhythm: Normal rate and regular rhythm.     Heart sounds: No murmur heard. Pulmonary:     Effort: Pulmonary effort is normal. No respiratory distress.     Breath sounds: Normal breath sounds. No wheezing or rales.  Musculoskeletal:     Cervical back: Normal range of motion and neck supple.  Lymphadenopathy:     Cervical:     Right cervical: No superficial cervical adenopathy.    Left cervical: No superficial cervical adenopathy.  Skin:    General: Skin is warm and dry.     Capillary Refill: Capillary refill takes less than 2 seconds.  Neurological:  General: No focal deficit present.     Mental Status: She is alert and oriented to person, place, and time. Mental status is at baseline.  Psychiatric:        Mood and Affect: Mood normal.        Behavior: Behavior normal.        Thought Content: Thought content normal.        Judgment: Judgment normal.     Results for orders placed or performed in visit on 02/13/23  Rapid Strep screen([facility])   Collection Time: 02/13/23  9:45 AM   Specimen: Other   Other  Result Value Ref Range   Strep Gp A Ag, IA W/Reflex Negative Negative  Culture, Group A Strep   Collection Time: 02/13/23  9:45 AM   Other  Result Value Ref Range   Strep A Culture Negative   Veritor Flu A/B Waived   Collection Time: 02/13/23  9:45 AM  Result Value Ref Range   Influenza A Negative Negative   Influenza B Negative Negative      Assessment & Plan:   Problem List Items Addressed This Visit   None Visit Diagnoses        Sore throat    -  Primary   Strep test neg. Complete coure of cefdinir . Recommend using Tylenol  PRN for pain. Will also send viscous lidocaine .   Relevant Orders   Rapid Strep screen([facility])        Follow up plan: Return if symptoms worsen or fail to improve.

## 2023-02-23 DIAGNOSIS — G4733 Obstructive sleep apnea (adult) (pediatric): Secondary | ICD-10-CM | POA: Diagnosis not present

## 2023-02-25 LAB — CULTURE, GROUP A STREP: Strep A Culture: NEGATIVE

## 2023-02-25 LAB — RAPID STREP SCREEN (MED CTR MEBANE ONLY): Strep Gp A Ag, IA W/Reflex: NEGATIVE

## 2023-03-06 ENCOUNTER — Ambulatory Visit: Payer: BC Managed Care – PPO | Admitting: Nurse Practitioner

## 2023-03-06 ENCOUNTER — Encounter: Payer: Self-pay | Admitting: Nurse Practitioner

## 2023-03-06 MED ORDER — ZEPBOUND 5 MG/0.5ML ~~LOC~~ SOAJ: 5.0000 mg | SUBCUTANEOUS | 1 refills | Status: DC

## 2023-03-06 NOTE — Progress Notes (Signed)
BP 126/71 (BP Location: Left Arm, Patient Position: Sitting, Cuff Size: Large)   Pulse 85   Temp (!) 97.4 F (36.3 C) (Oral)   Ht 5\' 5"  (1.651 m)   Wt (!) 322 lb 6.4 oz (146.2 kg)   SpO2 98%   BMI 53.65 kg/m    Subjective:    Patient ID: Diana Sexton, female    DOB: Oct 18, 1985, 38 y.o.   MRN: [id number]  HPI: Diana Sexton is a 38 y.o. female  Chief Complaint  Patient presents with   Weight Management Screening    WEIGHT GAIN Patient has lost 6lbs since starting the Zepbound.  Has had more stools than normal but feels like she is tolerating the medication well.  Duration: years  Previous attempts at weight loss: yes Complications of obesity: prediabetes Peak weight: 333lb Weight loss goal:  Weight loss to date:  Requesting obesity pharmacotherapy: yes Current weight loss supplements/medications: no Previous weight loss supplements/meds: wegovy Calories:    Relevant past medical, surgical, family and social history reviewed and updated as indicated. Interim medical history since our last visit reviewed. Allergies and medications reviewed and updated.  Review of Systems  Constitutional:  Negative for unexpected weight change.    Per HPI unless specifically indicated above     Objective:    BP 126/71 (BP Location: Left Arm, Patient Position: Sitting, Cuff Size: Large)   Pulse 85   Temp (!) 97.4 F (36.3 C) (Oral)   Ht 5\' 5"  (1.651 m)   Wt (!) 322 lb 6.4 oz (146.2 kg)   SpO2 98%   BMI 53.65 kg/m   Wt Readings from Last 3 Encounters:  03/06/23 (!) 322 lb 6.4 oz (146.2 kg)  02/22/23 (!) 326 lb (147.9 kg)  02/13/23 (!) 328 lb 12.8 oz (149.1 kg)    Physical Exam Vitals and nursing note reviewed.  Constitutional:      General: She is not in acute distress.    Appearance: Normal appearance. She is obese. She is not ill-appearing, toxic-appearing or diaphoretic.  HENT:     Head: Normocephalic.     Right Ear: External ear normal.     Left Ear:  External ear normal.     Nose: Nose normal.     Mouth/Throat:     Mouth: Mucous membranes are moist.     Pharynx: Oropharynx is clear.  Eyes:     General:        Right eye: No discharge.        Left eye: No discharge.     Extraocular Movements: Extraocular movements intact.     Conjunctiva/sclera: Conjunctivae normal.     Pupils: Pupils are equal, round, and reactive to light.  Cardiovascular:     Rate and Rhythm: Normal rate and regular rhythm.     Heart sounds: No murmur heard. Pulmonary:     Effort: Pulmonary effort is normal. No respiratory distress.     Breath sounds: Normal breath sounds. No wheezing or rales.  Musculoskeletal:     Cervical back: Normal range of motion and neck supple.  Skin:    General: Skin is warm and dry.     Capillary Refill: Capillary refill takes less than 2 seconds.  Neurological:     General: No focal deficit present.     Mental Status: She is alert and oriented to person, place, and time. Mental status is at baseline.  Psychiatric:        Mood and Affect: Mood normal.  Behavior: Behavior normal.        Thought Content: Thought content normal.        Judgment: Judgment normal.    Results for orders placed or performed in visit on 02/22/23  Rapid Strep screen([facility])   Collection Time: 02/22/23  9:31 AM   Specimen: Other   Other  Result Value Ref Range   Strep Gp A Ag, IA W/Reflex Negative Negative  Culture, Group A Strep   Collection Time: 02/22/23  9:31 AM   Other  Result Value Ref Range   Strep A Culture Negative       Assessment & Plan:   Problem List Items Addressed This Visit       Other   Morbid obesity (HCC) - Primary   Chronic.  Has lost 6lbs since last month.  Doing well on Zepbound.  Took first dose of 0.5mg  on Saturday.  Will continue with 0.5mg  weekly.  Follow up in 3 months.  Call sooner if concerns arise.       Relevant Medications   tirzepatide (ZEPBOUND) 5 MG/0.5ML Pen      Follow up  plan: Return in about 3 months (around 06/04/2023) for Weight Managment.

## 2023-03-06 NOTE — Assessment & Plan Note (Signed)
Chronic.  Has lost 6lbs since last month.  Doing well on Zepbound.  Took first dose of 0.5mg  on Saturday.  Will continue with 0.5mg  weekly.  Follow up in 3 months.  Call sooner if concerns arise.

## 2023-03-07 ENCOUNTER — Ambulatory Visit: Payer: BC Managed Care – PPO | Admitting: Nurse Practitioner

## 2023-03-15 DIAGNOSIS — G4733 Obstructive sleep apnea (adult) (pediatric): Secondary | ICD-10-CM | POA: Diagnosis not present

## 2023-03-26 DIAGNOSIS — G4733 Obstructive sleep apnea (adult) (pediatric): Secondary | ICD-10-CM | POA: Diagnosis not present

## 2023-04-13 DIAGNOSIS — G4733 Obstructive sleep apnea (adult) (pediatric): Secondary | ICD-10-CM | POA: Diagnosis not present

## 2023-05-09 DIAGNOSIS — G4733 Obstructive sleep apnea (adult) (pediatric): Secondary | ICD-10-CM | POA: Diagnosis not present

## 2023-05-21 ENCOUNTER — Encounter: Payer: Self-pay | Admitting: Nurse Practitioner

## 2023-05-22 MED ORDER — ZEPBOUND 5 MG/0.5ML ~~LOC~~ SOAJ: 5.0000 mg | SUBCUTANEOUS | 1 refills | Status: DC

## 2023-06-07 ENCOUNTER — Ambulatory Visit: Payer: Self-pay | Admitting: Nurse Practitioner

## 2023-06-07 MED ORDER — ZEPBOUND 7.5 MG/0.5ML ~~LOC~~ SOAJ: 7.5000 mg | SUBCUTANEOUS | 2 refills | Status: DC

## 2023-06-07 MED ORDER — TIRZEPATIDE-WEIGHT MANAGEMENT 7.5 MG/0.5ML ~~LOC~~ SOLN: 7.5000 mg | SUBCUTANEOUS | 2 refills | Status: DC

## 2023-06-07 NOTE — Assessment & Plan Note (Addendum)
 Chronic.  Has lost 20lbs since last month.  Doing well on Zepbound .  Will increase Zepbound  to 7.5mg  weekly.  Follow up in 3 months.  Call sooner if concerns arise.

## 2023-06-07 NOTE — Progress Notes (Signed)
 BP (!) 149/81   Pulse 65   Temp (!) 95.4 F (35.2 C) (Oral)   Wt (!) 303 lb 6.4 oz (137.6 kg)   SpO2 98%   BMI 50.49 kg/m    Subjective:    Patient ID: Diana Sexton, female    DOB: [birth year], 38 y.o.   MRN: [id number]  HPI: Diana Sexton is a 38 y.o. female  Chief Complaint  Patient presents with   weight mangement     WEIGHT GAIN Patient has lost 30lbs since starting the Zepbound .  She has been working on diet and exercise.  Current weight is 330lb.  She feels like she is stuck at the same weight.  Duration: years  Previous attempts at weight loss: yes Complications of obesity: prediabetes Peak weight: 333lb Weight loss goal:  Weight loss to date:  Requesting obesity pharmacotherapy: yes Current weight loss supplements/medications: no Previous weight loss supplements/meds: wegovy  Calories:    Relevant past medical, surgical, family and social history reviewed and updated as indicated. Interim medical history since our last visit reviewed. Allergies and medications reviewed and updated.  Review of Systems  Constitutional:  Negative for unexpected weight change.    Per HPI unless specifically indicated above     Objective:    BP (!) 149/81   Pulse 65   Temp (!) 95.4 F (35.2 C) (Oral)   Wt (!) 303 lb 6.4 oz (137.6 kg)   SpO2 98%   BMI 50.49 kg/m   Wt Readings from Last 3 Encounters:  06/07/23 (!) 303 lb 6.4 oz (137.6 kg)  03/06/23 (!) 322 lb 6.4 oz (146.2 kg)  02/22/23 (!) 326 lb (147.9 kg)    Physical Exam Vitals and nursing note reviewed.  Constitutional:      General: She is not in acute distress.    Appearance: Normal appearance. She is obese. She is not ill-appearing, toxic-appearing or diaphoretic.  HENT:     Head: Normocephalic.     Right Ear: External ear normal.     Left Ear: External ear normal.     Nose: Nose normal.     Mouth/Throat:     Mouth: Mucous membranes are moist.     Pharynx: Oropharynx is clear.  Eyes:      General:        Right eye: No discharge.        Left eye: No discharge.     Extraocular Movements: Extraocular movements intact.     Conjunctiva/sclera: Conjunctivae normal.     Pupils: Pupils are equal, round, and reactive to light.  Cardiovascular:     Rate and Rhythm: Normal rate and regular rhythm.     Heart sounds: No murmur heard. Pulmonary:     Effort: Pulmonary effort is normal. No respiratory distress.     Breath sounds: Normal breath sounds. No wheezing or rales.  Musculoskeletal:     Cervical back: Normal range of motion and neck supple.  Skin:    General: Skin is warm and dry.     Capillary Refill: Capillary refill takes less than 2 seconds.  Neurological:     General: No focal deficit present.     Mental Status: She is alert and oriented to person, place, and time. Mental status is at baseline.  Psychiatric:        Mood and Affect: Mood normal.        Behavior: Behavior normal.        Thought Content: Thought content normal.  Judgment: Judgment normal.    Results for orders placed or performed in visit on 02/22/23  Rapid Strep screen([facility])   Collection Time: 02/22/23  9:31 AM   Specimen: Other   Other  Result Value Ref Range   Strep Gp A Ag, IA W/Reflex Negative Negative  Culture, Group A Strep   Collection Time: 02/22/23  9:31 AM   Other  Result Value Ref Range   Strep A Culture Negative       Assessment & Plan:   Problem List Items Addressed This Visit       Other   Morbid obesity (HCC) - Primary   Chronic.  Has lost 20lbs since last month.  Doing well on Zepbound .  Will increase Zepbound  to 7.5mg  weekly.  Follow up in 3 months.  Call sooner if concerns arise.       Relevant Medications   tirzepatide  (ZEPBOUND ) 7.5 MG/0.5ML Pen      Follow up plan: Return in about 3 months (around 09/06/2023) for Weight Managment.

## 2023-07-09 DIAGNOSIS — G4733 Obstructive sleep apnea (adult) (pediatric): Secondary | ICD-10-CM | POA: Diagnosis not present

## 2023-07-11 DIAGNOSIS — E890 Postprocedural hypothyroidism: Secondary | ICD-10-CM | POA: Diagnosis not present

## 2023-07-11 DIAGNOSIS — E041 Nontoxic single thyroid nodule: Secondary | ICD-10-CM | POA: Diagnosis not present

## 2023-07-11 DIAGNOSIS — E039 Hypothyroidism, unspecified: Secondary | ICD-10-CM | POA: Diagnosis not present

## 2023-07-11 DIAGNOSIS — Y836 Removal of other organ (partial) (total) as the cause of abnormal reaction of the patient, or of later complication, without mention of misadventure at the time of the procedure: Secondary | ICD-10-CM | POA: Diagnosis not present

## 2023-07-11 DIAGNOSIS — E049 Nontoxic goiter, unspecified: Secondary | ICD-10-CM | POA: Diagnosis not present

## 2023-07-11 DIAGNOSIS — Z9089 Acquired absence of other organs: Secondary | ICD-10-CM | POA: Diagnosis not present

## 2023-07-13 DIAGNOSIS — G4733 Obstructive sleep apnea (adult) (pediatric): Secondary | ICD-10-CM | POA: Diagnosis not present

## 2023-09-05 ENCOUNTER — Encounter: Payer: Self-pay | Admitting: Nurse Practitioner

## 2023-09-05 ENCOUNTER — Ambulatory Visit: Admitting: Nurse Practitioner

## 2023-09-05 DIAGNOSIS — F32A Depression, unspecified: Secondary | ICD-10-CM

## 2023-09-05 DIAGNOSIS — F419 Anxiety disorder, unspecified: Secondary | ICD-10-CM

## 2023-09-05 MED ORDER — TIRZEPATIDE-WEIGHT MANAGEMENT 10 MG/0.5ML ~~LOC~~ SOAJ: 10.0000 mg | SUBCUTANEOUS | 2 refills | Status: DC

## 2023-09-05 MED ORDER — BUPROPION HCL ER (XL) 150 MG PO TB24: 150.0000 mg | ORAL_TABLET | Freq: Every day | ORAL | 0 refills | Status: AC

## 2023-09-05 NOTE — Assessment & Plan Note (Signed)
 Chronic.  Not well controlled.  Will restart Wellbutrin  150mg  daily.  Follow up in 3 months. Call sooner if concerns arise.

## 2023-09-05 NOTE — Assessment & Plan Note (Signed)
 Chronic.  Has lost 30lbs since starting Zepbound .  Will increase Zepbound  to 10mg  weekly.  Follow up in 3 months.  Call sooner if concerns arise.

## 2023-09-05 NOTE — Progress Notes (Signed)
 BP (!) 104/55   Pulse 70   Temp 98 F (36.7 C) (Oral)   Ht 5' 5 (1.651 m)   Wt (!) 304 lb 9.6 oz (138.2 kg)   SpO2 97%   BMI 50.69 kg/m    Subjective:    Patient ID: Diana Sexton, female    DOB: Mar 31, 1985, 38 y.o.   MRN: [id number]  HPI: Diana Sexton is a 38 y.o. female  Chief Complaint  Patient presents with   Obesity    WEIGHT GAIN Patient states she has been through a really bad depression spell for a little bit due to life changes.  She is feeling better now.  Has lost 30lbs since starting the Zepbound .  She has been working on diet and exercise.  Current weight is 304lb.  She feels like she is stuck at the same weight.  Duration: years  Previous attempts at weight loss: yes Complications of obesity: prediabetes Peak weight: 333lb Weight loss goal:  Weight loss to date:  Requesting obesity pharmacotherapy: yes Current weight loss supplements/medications: no Previous weight loss supplements/meds: wegovy  Calories:     Relevant past medical, surgical, family and social history reviewed and updated as indicated. Interim medical history since our last visit reviewed. Allergies and medications reviewed and updated.  Review of Systems  Constitutional:  Negative for unexpected weight change.  Psychiatric/Behavioral:  Positive for dysphoric mood. Negative for suicidal ideas. The patient is nervous/anxious.     Per HPI unless specifically indicated above     Objective:    BP (!) 104/55   Pulse 70   Temp 98 F (36.7 C) (Oral)   Ht 5' 5 (1.651 m)   Wt (!) 304 lb 9.6 oz (138.2 kg)   SpO2 97%   BMI 50.69 kg/m   Wt Readings from Last 3 Encounters:  09/05/23 (!) 304 lb 9.6 oz (138.2 kg)  06/07/23 (!) 303 lb 6.4 oz (137.6 kg)  03/06/23 (!) 322 lb 6.4 oz (146.2 kg)    Physical Exam Vitals and nursing note reviewed.  Constitutional:      General: She is not in acute distress.    Appearance: Normal appearance. She is obese. She is not ill-appearing,  toxic-appearing or diaphoretic.  HENT:     Head: Normocephalic.     Right Ear: External ear normal.     Left Ear: External ear normal.     Nose: Nose normal.     Mouth/Throat:     Mouth: Mucous membranes are moist.     Pharynx: Oropharynx is clear.  Eyes:     General:        Right eye: No discharge.        Left eye: No discharge.     Extraocular Movements: Extraocular movements intact.     Conjunctiva/sclera: Conjunctivae normal.     Pupils: Pupils are equal, round, and reactive to light.  Cardiovascular:     Rate and Rhythm: Normal rate and regular rhythm.     Heart sounds: No murmur heard. Pulmonary:     Effort: Pulmonary effort is normal. No respiratory distress.     Breath sounds: Normal breath sounds. No wheezing or rales.  Musculoskeletal:     Cervical back: Normal range of motion and neck supple.  Skin:    General: Skin is warm and dry.     Capillary Refill: Capillary refill takes less than 2 seconds.  Neurological:     General: No focal deficit present.     Mental  Status: She is alert and oriented to person, place, and time. Mental status is at baseline.  Psychiatric:        Mood and Affect: Mood normal. Affect is tearful.        Behavior: Behavior normal.        Thought Content: Thought content normal.        Judgment: Judgment normal.    Results for orders placed or performed in visit on 02/22/23  Rapid Strep screen([facility])   Collection Time: 02/22/23  9:31 AM   Specimen: Other   Other  Result Value Ref Range   Strep Gp A Ag, IA W/Reflex Negative Negative  Culture, Group A Strep   Collection Time: 02/22/23  9:31 AM   Other  Result Value Ref Range   Strep A Culture Negative       Assessment & Plan:   Problem List Items Addressed This Visit       Other   Morbid obesity (HCC) - Primary   Chronic.  Has lost 30lbs since starting Zepbound .  Will increase Zepbound  to 10mg  weekly.  Follow up in 3 months.  Call sooner if concerns arise.        Relevant Medications   tirzepatide  (ZEPBOUND ) 10 MG/0.5ML Pen   Anxiety and depression   Chronic.  Not well controlled.  Will restart Wellbutrin  150mg  daily.  Follow up in 3 months. Call sooner if concerns arise.       Relevant Medications   buPROPion  (WELLBUTRIN  XL) 150 MG 24 hr tablet       Follow up plan: Return in about 3 months (around 12/06/2023) for Weight Managment.

## 2023-09-13 ENCOUNTER — Encounter: Payer: Self-pay | Admitting: Nurse Practitioner

## 2023-09-14 ENCOUNTER — Telehealth: Payer: Self-pay

## 2023-09-14 NOTE — Telephone Encounter (Signed)
 Copied from CRM [id number]. Topic: Clinical - Lab/Test Results >> Sep 13, 2023  3:48 PM Sasha H wrote: Reason for CRM: Pt is wanting to know if she can get a copy of her last Tb test and pre employment screen. Please reach out

## 2023-09-17 ENCOUNTER — Encounter: Payer: Self-pay | Admitting: Nurse Practitioner

## 2023-09-17 ENCOUNTER — Ambulatory Visit (INDEPENDENT_AMBULATORY_CARE_PROVIDER_SITE_OTHER): Admitting: Nurse Practitioner

## 2023-09-17 VITALS — BP 123/79 | HR 75 | Temp 97.9°F | Ht 65.0 in | Wt 303.2 lb

## 2023-09-17 DIAGNOSIS — F32A Depression, unspecified: Secondary | ICD-10-CM | POA: Diagnosis not present

## 2023-09-17 DIAGNOSIS — Z0289 Encounter for other administrative examinations: Secondary | ICD-10-CM

## 2023-09-17 DIAGNOSIS — F419 Anxiety disorder, unspecified: Secondary | ICD-10-CM

## 2023-09-17 MED ORDER — ALPRAZOLAM 0.25 MG PO TABS: 0.2500 mg | ORAL_TABLET | Freq: Two times a day (BID) | ORAL | 0 refills | Status: AC | PRN

## 2023-09-17 NOTE — Assessment & Plan Note (Signed)
 Chronic. Not well controlled.  Continue taking Wellbutrin .  Will give short supply of xanax  to be used when a panic attack is coming on.  Side effects and benefits of medication discussed.  Follow up in 1 month.  Call sooner if concerns arise.

## 2023-09-17 NOTE — Progress Notes (Signed)
 BP 123/79   Pulse 75   Temp 97.9 F (36.6 C) (Oral)   Ht 5' 5 (1.651 m)   Wt (!) 303 lb 3.2 oz (137.5 kg)   SpO2 98%   BMI 50.46 kg/m    Subjective:    Patient ID: Diana Sexton, female    DOB: [birth year], 38 y.o.   MRN: [id number]  HPI: Diana Sexton is a 38 y.o. female  Chief Complaint  Patient presents with   FORMS   Patient presents to clinic to have form completed for new employer.    Patient continues to struggle with her anxiety.  Not sure if the wellbutrin  is helping any right now.  She is having a lot of stress and worry over her husband and his job.  She has been having panic attacks recently and finding it hard to calm down.   Relevant past medical, surgical, family and social history reviewed and updated as indicated. Interim medical history since our last visit reviewed. Allergies and medications reviewed and updated.  Review of Systems  Psychiatric/Behavioral:  The patient is nervous/anxious.     Per HPI unless specifically indicated above     Objective:    BP 123/79   Pulse 75   Temp 97.9 F (36.6 C) (Oral)   Ht 5' 5 (1.651 m)   Wt (!) 303 lb 3.2 oz (137.5 kg)   SpO2 98%   BMI 50.46 kg/m   Wt Readings from Last 3 Encounters:  09/17/23 (!) 303 lb 3.2 oz (137.5 kg)  09/05/23 (!) 304 lb 9.6 oz (138.2 kg)  06/07/23 (!) 303 lb 6.4 oz (137.6 kg)    Physical Exam Vitals and nursing note reviewed.  Constitutional:      General: She is not in acute distress.    Appearance: Normal appearance. She is normal weight. She is not ill-appearing, toxic-appearing or diaphoretic.  HENT:     Head: Normocephalic.     Right Ear: External ear normal.     Left Ear: External ear normal.     Nose: Nose normal.     Mouth/Throat:     Mouth: Mucous membranes are moist.     Pharynx: Oropharynx is clear.  Eyes:     General:        Right eye: No discharge.        Left eye: No discharge.     Extraocular Movements: Extraocular movements intact.      Conjunctiva/sclera: Conjunctivae normal.     Pupils: Pupils are equal, round, and reactive to light.  Cardiovascular:     Rate and Rhythm: Normal rate and regular rhythm.     Heart sounds: No murmur heard. Pulmonary:     Effort: Pulmonary effort is normal. No respiratory distress.     Breath sounds: Normal breath sounds. No wheezing or rales.  Musculoskeletal:     Cervical back: Normal range of motion and neck supple.  Skin:    General: Skin is warm and dry.     Capillary Refill: Capillary refill takes less than 2 seconds.  Neurological:     General: No focal deficit present.     Mental Status: She is alert and oriented to person, place, and time. Mental status is at baseline.  Psychiatric:        Mood and Affect: Mood normal. Affect is tearful.        Behavior: Behavior normal.        Thought Content: Thought content normal.  Judgment: Judgment normal.     Results for orders placed or performed in visit on 02/22/23  Rapid Strep screen([facility])   Collection Time: 02/22/23  9:31 AM   Specimen: Other   Other  Result Value Ref Range   Strep Gp A Ag, IA W/Reflex Negative Negative  Culture, Group A Strep   Collection Time: 02/22/23  9:31 AM   Other  Result Value Ref Range   Strep A Culture Negative       Assessment & Plan:   Problem List Items Addressed This Visit       Other   Anxiety and depression   Chronic. Not well controlled.  Continue taking Wellbutrin .  Will give short supply of xanax  to be used when a panic attack is coming on.  Side effects and benefits of medication discussed.  Follow up in 1 month.  Call sooner if concerns arise.       Relevant Medications   ALPRAZolam  (XANAX ) 0.25 MG tablet   Other Visit Diagnoses       Encounter for completion of form with patient    -  Primary   Form completed with patient for new employer.        Follow up plan: No follow-ups on file.

## 2023-10-10 ENCOUNTER — Ambulatory Visit: Admitting: Nurse Practitioner

## 2023-10-10 DIAGNOSIS — H6692 Otitis media, unspecified, left ear: Secondary | ICD-10-CM | POA: Diagnosis not present

## 2023-10-24 ENCOUNTER — Ambulatory Visit: Admitting: Nurse Practitioner

## 2023-10-24 ENCOUNTER — Encounter: Payer: Self-pay | Admitting: Nurse Practitioner

## 2023-10-24 DIAGNOSIS — F419 Anxiety disorder, unspecified: Secondary | ICD-10-CM | POA: Diagnosis not present

## 2023-10-24 DIAGNOSIS — F32A Depression, unspecified: Secondary | ICD-10-CM | POA: Diagnosis not present

## 2023-10-24 MED ORDER — ZEPBOUND 12.5 MG/0.5ML ~~LOC~~ SOAJ: 12.5000 mg | SUBCUTANEOUS | 2 refills | Status: AC

## 2023-10-24 NOTE — Progress Notes (Signed)
 BP 116/70   Pulse 93   Temp 97.9 F (36.6 C) (Oral)   Ht 5' 5 (1.651 m)   Wt 296 lb (134.3 kg)   SpO2 98%   BMI 49.26 kg/m    Subjective:    Patient ID: Diana Sexton, female    DOB: Aug 20, 1985, 38 y.o.   MRN: [id number]  HPI: Diana Sexton is a 38 y.o. female  Chief Complaint  Patient presents with   Anxiety   Depression   Obesity   Patient continues to struggle with her anxiety.  She is back on her wellbutrin  and feels like it is working well for her.  She only took 2-3 of her xanax  and still has some.  Things are still up in the air with her husbands job.  She may be out of insurance soon.    Flowsheet Row Office Visit from 10/24/2023 in [facility]  PHQ-9 Total Score 0      10/24/2023    3:11 PM 09/05/2023    8:59 AM 06/07/2023    8:28 AM 02/22/2023    9:18 AM  GAD 7 : Generalized Anxiety Score  Nervous, Anxious, on Edge 0 0 0 0  Control/stop worrying 0 1 0 0  Worry too much - different things 0 0 0 0  Trouble relaxing 0 0 0 0  Restless 0 0 0 0  Easily annoyed or irritable 0 0 0 0  Afraid - awful might happen 0 0 0 0  Total GAD 7 Score 0 1 0 0  Anxiety Difficulty Not difficult at all Not difficult at all Not difficult at all    She has lost 7lbs since last visit.  She is doing well with Zepbound .     Relevant past medical, surgical, family and social history reviewed and updated as indicated. Interim medical history since our last visit reviewed. Allergies and medications reviewed and updated.  Review of Systems  Constitutional:  Negative for unexpected weight change.  Psychiatric/Behavioral:  The patient is nervous/anxious.     Per HPI unless specifically indicated above     Objective:    BP 116/70   Pulse 93   Temp 97.9 F (36.6 C) (Oral)   Ht 5' 5 (1.651 m)   Wt 296 lb (134.3 kg)   SpO2 98%   BMI 49.26 kg/m   Wt Readings from Last 3 Encounters:  10/24/23 296 lb (134.3 kg)  09/17/23 (!) 303 lb 3.2 oz (137.5 kg)   09/05/23 (!) 304 lb 9.6 oz (138.2 kg)    Physical Exam Vitals and nursing note reviewed.  Constitutional:      General: She is not in acute distress.    Appearance: Normal appearance. She is obese. She is not ill-appearing, toxic-appearing or diaphoretic.  HENT:     Head: Normocephalic.     Right Ear: External ear normal.     Left Ear: External ear normal.     Nose: Nose normal.     Mouth/Throat:     Mouth: Mucous membranes are moist.     Pharynx: Oropharynx is clear.  Eyes:     General:        Right eye: No discharge.        Left eye: No discharge.     Extraocular Movements: Extraocular movements intact.     Conjunctiva/sclera: Conjunctivae normal.     Pupils: Pupils are equal, round, and reactive to light.  Cardiovascular:     Rate and  Rhythm: Normal rate and regular rhythm.     Heart sounds: No murmur heard. Pulmonary:     Effort: Pulmonary effort is normal. No respiratory distress.     Breath sounds: Normal breath sounds. No wheezing or rales.  Musculoskeletal:     Cervical back: Normal range of motion and neck supple.  Skin:    General: Skin is warm and dry.     Capillary Refill: Capillary refill takes less than 2 seconds.  Neurological:     General: No focal deficit present.     Mental Status: She is alert and oriented to person, place, and time. Mental status is at baseline.  Psychiatric:        Mood and Affect: Mood normal. Affect is tearful.        Behavior: Behavior normal.        Thought Content: Thought content normal.        Judgment: Judgment normal.     Results for orders placed or performed in visit on 02/22/23  Rapid Strep screen([facility])   Collection Time: 02/22/23  9:31 AM   Specimen: Other   Other  Result Value Ref Range   Strep Gp A Ag, IA W/Reflex Negative Negative  Culture, Group A Strep   Collection Time: 02/22/23  9:31 AM   Other  Result Value Ref Range   Strep A Culture Negative       Assessment & Plan:   Problem List  Items Addressed This Visit       Other   Morbid obesity (HCC) - Primary   Chronic.  Has lost another 7lbs since increase the dose of Zepbound .  Will increase Zepbound  to 12.5mg  weekly.  Follow up in 3 months.  Call sooner if concerns arise.       Relevant Medications   tirzepatide  (ZEPBOUND ) 12.5 MG/0.5ML Pen   Anxiety and depression   Chronic.  Controlled.  Continue with current medication regimen of Wellbutrin  150mg  daily Return to clinic in 6 months for reevaluation.  Call sooner if concerns arise.           Follow up plan: No follow-ups on file.

## 2023-10-24 NOTE — Assessment & Plan Note (Signed)
 Chronic.  Has lost another 7lbs since increase the dose of Zepbound .  Will increase Zepbound  to 12.5mg  weekly.  Follow up in 3 months.  Call sooner if concerns arise.

## 2023-10-24 NOTE — Assessment & Plan Note (Signed)
 Chronic.  Controlled.  Continue with current medication regimen of Wellbutrin  150mg  daily Return to clinic in 6 months for reevaluation.  Call sooner if concerns arise.

## 2023-11-12 ENCOUNTER — Ambulatory Visit: Admitting: Nurse Practitioner

## 2023-11-15 ENCOUNTER — Encounter: Payer: Self-pay | Admitting: Nurse Practitioner

## 2023-12-07 ENCOUNTER — Ambulatory Visit: Admitting: Nurse Practitioner

## 2024-02-04 ENCOUNTER — Ambulatory Visit: Admitting: Nurse Practitioner
